# Patient Record
Sex: Female | Born: 1965 | Marital: Married | Smoking: Never smoker
Health system: Southern US, Community
[De-identification: ages and names within clinical notes are randomized; demographics above are authoritative.]

## PROBLEM LIST (undated history)

## (undated) DIAGNOSIS — G43909 Migraine, unspecified, not intractable, without status migrainosus: Secondary | ICD-10-CM

## (undated) DIAGNOSIS — Z800 Family history of malignant neoplasm of digestive organs: Secondary | ICD-10-CM

## (undated) DIAGNOSIS — Z8042 Family history of malignant neoplasm of prostate: Secondary | ICD-10-CM

## (undated) DIAGNOSIS — M19279 Secondary osteoarthritis, unspecified ankle and foot: Secondary | ICD-10-CM

## (undated) DIAGNOSIS — M1990 Unspecified osteoarthritis, unspecified site: Secondary | ICD-10-CM

## (undated) DIAGNOSIS — C50919 Malignant neoplasm of unspecified site of unspecified female breast: Secondary | ICD-10-CM

## (undated) HISTORY — DX: Malignant neoplasm of unspecified site of unspecified female breast: C50.919

## (undated) HISTORY — PX: OTHER SURGICAL HISTORY: SHX169

## (undated) HISTORY — PX: MASTECTOMY: SHX3

## (undated) HISTORY — PX: DILATION AND CURETTAGE OF UTERUS: SHX78

---

## 2020-04-28 ENCOUNTER — Other Ambulatory Visit: Payer: Self-pay | Admitting: Orthopedic Surgery

## 2020-05-01 ENCOUNTER — Encounter (HOSPITAL_BASED_OUTPATIENT_CLINIC_OR_DEPARTMENT_OTHER): Payer: Self-pay | Admitting: Orthopedic Surgery

## 2020-05-01 ENCOUNTER — Other Ambulatory Visit: Payer: Self-pay

## 2020-05-05 ENCOUNTER — Other Ambulatory Visit (HOSPITAL_COMMUNITY)
Admission: RE | Admit: 2020-05-05 | Discharge: 2020-05-05 | Disposition: A | Discharge status: Home / Self Care | Payer: Managed Care, Other (non HMO) | Source: Ambulatory Visit | Attending: Orthopedic Surgery | Admitting: Orthopedic Surgery

## 2020-05-05 DIAGNOSIS — Z20822 Contact with and (suspected) exposure to covid-19: Secondary | ICD-10-CM | POA: Diagnosis not present

## 2020-05-05 DIAGNOSIS — Z01812 Encounter for preprocedural laboratory examination: Secondary | ICD-10-CM | POA: Diagnosis present

## 2020-05-06 LAB — SARS CORONAVIRUS 2 (TAT 6-24 HRS): SARS Coronavirus 2: NEGATIVE

## 2020-05-08 ENCOUNTER — Encounter (HOSPITAL_BASED_OUTPATIENT_CLINIC_OR_DEPARTMENT_OTHER): Payer: Self-pay | Admitting: Orthopedic Surgery

## 2020-05-08 ENCOUNTER — Ambulatory Visit (HOSPITAL_BASED_OUTPATIENT_CLINIC_OR_DEPARTMENT_OTHER): Payer: Managed Care, Other (non HMO) | Admitting: Anesthesiology

## 2020-05-08 ENCOUNTER — Ambulatory Visit (HOSPITAL_BASED_OUTPATIENT_CLINIC_OR_DEPARTMENT_OTHER)
Admission: RE | Admit: 2020-05-08 | Discharge: 2020-05-08 | Disposition: A | Discharge status: Home / Self Care | Payer: Managed Care, Other (non HMO) | Attending: Orthopedic Surgery | Admitting: Orthopedic Surgery

## 2020-05-08 ENCOUNTER — Encounter (HOSPITAL_BASED_OUTPATIENT_CLINIC_OR_DEPARTMENT_OTHER)
Admission: RE | Disposition: A | Discharge status: Home / Self Care | Payer: Self-pay | Source: Home / Self Care | Attending: Orthopedic Surgery

## 2020-05-08 ENCOUNTER — Other Ambulatory Visit: Payer: Self-pay

## 2020-05-08 DIAGNOSIS — Z79899 Other long term (current) drug therapy: Secondary | ICD-10-CM | POA: Diagnosis not present

## 2020-05-08 DIAGNOSIS — M65342 Trigger finger, left ring finger: Secondary | ICD-10-CM | POA: Insufficient documentation

## 2020-05-08 DIAGNOSIS — M65332 Trigger finger, left middle finger: Secondary | ICD-10-CM | POA: Diagnosis present

## 2020-05-08 HISTORY — PX: TRIGGER FINGER RELEASE: SHX641

## 2020-05-08 HISTORY — DX: Migraine, unspecified, not intractable, without status migrainosus: G43.909

## 2020-05-08 HISTORY — DX: Secondary osteoarthritis, unspecified ankle and foot: M19.279

## 2020-05-08 HISTORY — DX: Unspecified osteoarthritis, unspecified site: M19.90

## 2020-05-08 SURGERY — RELEASE, A1 PULLEY, FOR TRIGGER FINGER
Anesthesia: Regional | Site: Hand | Laterality: Left

## 2020-05-08 MED ORDER — HYDROMORPHONE HCL 1 MG/ML IJ SOLN: 0.2500 mg | INTRAMUSCULAR | Status: DC | PRN

## 2020-05-08 MED ORDER — FENTANYL CITRATE (PF) 100 MCG/2ML IJ SOLN
INTRAMUSCULAR | Status: DC | PRN
  Administered 2020-05-08: 25 ug via INTRAVENOUS
  Administered 2020-05-08: 50 ug via INTRAVENOUS

## 2020-05-08 MED ORDER — MIDAZOLAM HCL 2 MG/2ML IJ SOLN
INTRAMUSCULAR | Status: AC
  Filled 2020-05-08: qty 2

## 2020-05-08 MED ORDER — OXYCODONE HCL 5 MG PO TABS: 5.0000 mg | ORAL_TABLET | Freq: Once | ORAL | Status: DC | PRN

## 2020-05-08 MED ORDER — PROPOFOL 500 MG/50ML IV EMUL
INTRAVENOUS | Status: DC | PRN
  Administered 2020-05-08: 100 ug/kg/min via INTRAVENOUS

## 2020-05-08 MED ORDER — ONDANSETRON HCL 4 MG/2ML IJ SOLN
INTRAMUSCULAR | Status: DC | PRN
  Administered 2020-05-08: 4 mg via INTRAVENOUS

## 2020-05-08 MED ORDER — CEFAZOLIN SODIUM-DEXTROSE 2-4 GM/100ML-% IV SOLN
INTRAVENOUS | Status: AC
  Filled 2020-05-08: qty 100

## 2020-05-08 MED ORDER — LIDOCAINE HCL (PF) 0.5 % IJ SOLN
INTRAMUSCULAR | Status: DC | PRN
  Administered 2020-05-08: 30 mL via INTRAVENOUS

## 2020-05-08 MED ORDER — HYDROCODONE-ACETAMINOPHEN 5-325 MG PO TABS: ORAL_TABLET | ORAL | 0 refills | Status: DC

## 2020-05-08 MED ORDER — MIDAZOLAM HCL 5 MG/5ML IJ SOLN
INTRAMUSCULAR | Status: DC | PRN
  Administered 2020-05-08: 2 mg via INTRAVENOUS

## 2020-05-08 MED ORDER — FENTANYL CITRATE (PF) 100 MCG/2ML IJ SOLN
INTRAMUSCULAR | Status: AC
  Filled 2020-05-08: qty 2

## 2020-05-08 MED ORDER — ONDANSETRON HCL 4 MG/2ML IJ SOLN
INTRAMUSCULAR | Status: AC
  Filled 2020-05-08: qty 2

## 2020-05-08 MED ORDER — BUPIVACAINE HCL (PF) 0.25 % IJ SOLN
INTRAMUSCULAR | Status: DC | PRN
  Administered 2020-05-08: 7 mL

## 2020-05-08 MED ORDER — CEFAZOLIN SODIUM-DEXTROSE 2-4 GM/100ML-% IV SOLN
2.0000 g | INTRAVENOUS | Status: AC
  Administered 2020-05-08: 2 g via INTRAVENOUS

## 2020-05-08 MED ORDER — PROMETHAZINE HCL 25 MG/ML IJ SOLN: 6.2500 mg | INTRAMUSCULAR | Status: DC | PRN

## 2020-05-08 MED ORDER — OXYCODONE HCL 5 MG/5ML PO SOLN: 5.0000 mg | Freq: Once | ORAL | Status: DC | PRN

## 2020-05-08 MED ORDER — LACTATED RINGERS IV SOLN: INTRAVENOUS | Status: DC

## 2020-05-08 SURGICAL SUPPLY — 33 items
APL PRP STRL LF DISP 70% ISPRP (MISCELLANEOUS) ×1
BLADE SURG 15 STRL LF DISP TIS (BLADE) ×2 IMPLANT
BLADE SURG 15 STRL SS (BLADE) ×4
BNDG CMPR 9X4 STRL LF SNTH (GAUZE/BANDAGES/DRESSINGS)
BNDG COHESIVE 2X5 TAN STRL LF (GAUZE/BANDAGES/DRESSINGS) ×2 IMPLANT
BNDG ESMARK 4X9 LF (GAUZE/BANDAGES/DRESSINGS) IMPLANT
CHLORAPREP W/TINT 26 (MISCELLANEOUS) ×2 IMPLANT
CORD BIPOLAR FORCEPS 12FT (ELECTRODE) ×2 IMPLANT
COVER BACK TABLE 60X90IN (DRAPES) ×2 IMPLANT
COVER MAYO STAND STRL (DRAPES) ×2 IMPLANT
COVER WAND RF STERILE (DRAPES) IMPLANT
CUFF TOURN SGL QUICK 18X4 (TOURNIQUET CUFF) ×2 IMPLANT
DRAPE EXTREMITY T 121X128X90 (DISPOSABLE) ×2 IMPLANT
DRAPE SURG 17X23 STRL (DRAPES) ×2 IMPLANT
GAUZE SPONGE 4X4 12PLY STRL (GAUZE/BANDAGES/DRESSINGS) ×2 IMPLANT
GAUZE XEROFORM 1X8 LF (GAUZE/BANDAGES/DRESSINGS) ×2 IMPLANT
GLOVE SRG 8 PF TXTR STRL LF DI (GLOVE) ×1 IMPLANT
GLOVE SURG ENC MOIS LTX SZ6.5 (GLOVE) ×2 IMPLANT
GLOVE SURG ENC MOIS LTX SZ7.5 (GLOVE) ×2 IMPLANT
GLOVE SURG UNDER POLY LF SZ7 (GLOVE) ×2 IMPLANT
GLOVE SURG UNDER POLY LF SZ8 (GLOVE) ×2
GOWN STRL REUS W/ TWL LRG LVL3 (GOWN DISPOSABLE) ×1 IMPLANT
GOWN STRL REUS W/TWL LRG LVL3 (GOWN DISPOSABLE) ×2
GOWN STRL REUS W/TWL XL LVL3 (GOWN DISPOSABLE) ×2 IMPLANT
NEEDLE HYPO 25X1 1.5 SAFETY (NEEDLE) ×2 IMPLANT
NS IRRIG 1000ML POUR BTL (IV SOLUTION) ×2 IMPLANT
PACK BASIN DAY SURGERY FS (CUSTOM PROCEDURE TRAY) ×2 IMPLANT
STOCKINETTE 4X48 STRL (DRAPES) ×2 IMPLANT
SUT ETHILON 4 0 PS 2 18 (SUTURE) ×2 IMPLANT
SYR BULB EAR ULCER 3OZ GRN STR (SYRINGE) ×2 IMPLANT
SYR CONTROL 10ML LL (SYRINGE) ×2 IMPLANT
TOWEL GREEN STERILE FF (TOWEL DISPOSABLE) ×4 IMPLANT
UNDERPAD 30X36 HEAVY ABSORB (UNDERPADS AND DIAPERS) ×2 IMPLANT

## 2020-05-08 NOTE — Anesthesia Postprocedure Evaluation (Signed)
Anesthesia Post Note  Patient: Julie Townsend  Procedure(s) Performed: LEFT LONG FINGER TRIGGER RELEASE AND LEFT RING FINGER TRIGGER RELEASE (Left Hand)     Patient location during evaluation: PACU Anesthesia Type: Bier Block Level of consciousness: awake and alert Pain management: pain level controlled Vital Signs Assessment: post-procedure vital signs reviewed and stable Respiratory status: spontaneous breathing, nonlabored ventilation and respiratory function stable Cardiovascular status: blood pressure returned to baseline and stable Postop Assessment: no apparent nausea or vomiting Anesthetic complications: no   No complications documented.  Last Vitals:  Vitals:   05/08/20 1152 05/08/20 1200  BP: 131/74 (!) 166/92  Pulse: 64 81  Resp: 12 12  Temp:  36.4 C  SpO2: 100% 100%    Last Pain:  Vitals:   05/08/20 1200  TempSrc:   PainSc: 0-No pain                 Lynda Rainwater

## 2020-05-08 NOTE — Op Note (Signed)
05/08/2020 [facility]  Operative Note  PREOPERATIVE DIAGNOSIS: LEFT LONG FINGER AND LEFT RING FINGER TRIGGER DIGITS  POSTOPERATIVE DIAGNOSIS:  LEFT LONG FINGER AND LEFT RING FINGER TRIGGER DIGITS  PROCEDURE: Procedure(s): LEFT LONG FINGER TRIGGER RELEASE AND LEFT RING FINGER TRIGGER RELEASE  SURGEON:  [name], MD  ASSISTANT:  none.  ANESTHESIA:  Bier block with sedation.  IV FLUIDS:  Per anesthesia flow sheet.  ESTIMATED BLOOD LOSS:  Minimal.  COMPLICATIONS:  None.  SPECIMENS:  tenosynovium to pathology  TOURNIQUET TIME:  Total Tourniquet Time Documented: Forearm (Left) - 27 minutes Total: Forearm (Left) - 27 minutes   DISPOSITION:  Stable to PACU.  LOCATION: [facility]  INDICATIONS: Julie Townsend is a 55 y.o. female with triggering left long and ring fingers.  She has had these injected without lasting resolution.  She wishes to have trigger release of the left long and ring fingers.  Risks, benefits and alternatives of surgery were discussed including the risk of blood loss, infection, damage to nerves, vessels, tendons, ligaments, bone, failure of surgery, need for additional surgery, complications with wound healing, continued pain, continued triggering and need for repeat surgery.  She voiced understanding of these risks and elected to proceed.  OPERATIVE COURSE:  After being identified preoperatively by myself, the patient and I agreed upon the procedure and site of procedure.  The surgical site was marked. Surgical consent had been signed. She was given preoperative antibiotic prophylaxis. She was transported to the operating room and placed on the operating room table in supine position with the Left upper extremity on an arm board. Bier block anesthesia was induced by the anesthesiologist.  The Left upper extremity was prepped and draped in normal sterile orthopedic fashion. A surgical pause was performed between surgeons,  anesthesia, and operating room staff, and all were in agreement as to the patient, procedure, and site of procedure.  Tourniquet at the proximal aspect of the forearm had been inflated for the Bier block.  An incision was made at the volar aspect of the MP joint of the long finger.  This was carried into the subcutaneous tissues by preading technique.  Bipolar electrocautery was used to obtain hemostasis.  The radial and ulnar digital nerves were protected throughout the case. The flexor sheath was identified.  The A1 pulley was identified and sharply incised.  It was released in its entirety.  It was thickened.  The proximal 1-2 mm of the A2 pulley was vented to allow better excursion of the tendons.  The finger was placed through a range of motion and there was noted to be no catching.  The tendons were brought through the wound.  There was thickened synovium and adhesion between the tendons.  The adhesions between the tendons were released and synovium excised and sent to pathology for examination.  An incision was made at the volar aspect of the MP joint of the ring finger.  This was carried into the subcutaneous tissues by preading technique.  Bipolar electrocautery was used to obtain hemostasis.  The radial and ulnar digital nerves were protected throughout the case. The flexor sheath was identified.  The A1 pulley was identified and sharply incised.  It was released in its entirety.  The proximal 1-2 mm of the A2 pulley was vented to allow better excursion of the tendons.  The finger was placed through a range of motion and there was noted to be no catching.  The tendons were brought through the  wound. The tendons were brought through the wound.  There was thickened synovium and adhesion between the tendons.  The adhesions between the tendons were released and synovium excised and sent to pathology for examination. The wound was then copiously irrigated with sterile saline. It was closed with 4-0 nylon in a  horizontal mattress fashion.  It was injected with 0.25% plain Marcaine to aid in postoperative analgesia.  It was dressed with sterile Xeroform, 4x4s, and wrapped lightly with a Coban dressing.  Tourniquet was deflated at 27 minutes.  The fingertips were pink with brisk capillary refill after deflation of the tourniquet.  The operative drapes were broken down and the patient was awoken from anesthesia safely.  She was transferred back to the stretcher and taken to the PACU in stable condition.   I will see her back in the office in 1 week for postoperative followup.  I will give her a prescription for Norco 5/325 1-2 tabs PO q6 hours prn pain, dispense # 20.    [name], MD Electronically signed, 05/08/20

## 2020-05-08 NOTE — H&P (Signed)
Julie Townsend is an 55 y.o. female.   Chief Complaint: trigger digits HPI: 55 year old female with triggering of left long and ring fingers.  These have been injected previously without lasting resolution.  She wishes to have surgical release of the left long and ring finger trigger digits.  Allergies: Not on File  Past Medical History:  Diagnosis Date  . Migraines   . 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 trigger finger release rt hand      Family History: History reviewed. No pertinent family history.  Social History:   reports that she has never smoked. She has never used smokeless tobacco. She reports that she does not use drugs. No history on file for alcohol use.  Medications: Medications Prior to Admission  Medication Sig Dispense Refill  . Ascorbic Acid (VITAMIN C) 100 MG tablet Take 100 mg by mouth daily.    . cholecalciferol (VITAMIN D3) 25 MCG (1000 UNIT) tablet Take 1,000 Units by mouth daily.    . magnesium 30 MG tablet Take 30 mg by mouth 2 (two) times daily.    . SUMAtriptan (IMITREX) 50 MG tablet Take 50 mg by mouth once. May repeat in 2 hours if headache persists or recurs.    Marland Kitchen l-methylfolate-B6-B12 (METANX) 3-35-2 MG TABS tablet Take 1 tablet by mouth daily.      No results found for this or any previous visit (from the past 48 hour(s)).  No results found.   A comprehensive review of systems was negative.  Blood pressure 132/82, pulse 72, temperature (!) 97.5 F (36.4 C), temperature source Oral, resp. rate 18, height 5\' 2"  (1.575 m), weight 51.9 kg, SpO2 100 %.  General appearance: alert, cooperative and appears stated age Head: Normocephalic, without obvious abnormality, atraumatic Neck: supple, symmetrical, trachea midline Cardio: regular rate and rhythm Resp: clear to auscultation bilaterally Extremities: Intact sensation and capillary refill all digits.  +epl/fpl/io.   No wounds.  Pulses: 2+ and symmetric Skin: Skin color, texture, turgor normal. No rashes or lesions Neurologic: Grossly normal Incision/Wound: none  Assessment/Plan Left long and ring finger trigger digits.  Non operative and operative treatment options have been discussed with the patient and patient wishes to proceed with operative treatment. Risks, benefits, and alternatives of surgery have been discussed and the patient agrees with the plan of care.   Julie Townsend 05/08/2020, 10:18 AM

## 2020-05-08 NOTE — Anesthesia Preprocedure Evaluation (Signed)
Anesthesia Evaluation  Patient identified by MRN, date of birth, ID band Patient awake    Reviewed: Allergy & Precautions, NPO status , Patient's Chart, lab work & pertinent test results  Airway Mallampati: II  TM Distance: >3 FB Neck ROM: Full    Dental no notable dental hx.    Pulmonary neg pulmonary ROS,    Pulmonary exam normal breath sounds clear to auscultation       Cardiovascular negative cardio ROS Normal cardiovascular exam Rhythm:Regular Rate:Normal     Neuro/Psych  Headaches, negative psych ROS   GI/Hepatic negative GI ROS, Neg liver ROS,   Endo/Other  negative endocrine ROS  Renal/GU negative Renal ROS  negative genitourinary   Musculoskeletal  (+) Arthritis ,   Abdominal   Peds negative pediatric ROS (+)  Hematology negative hematology ROS (+)   Anesthesia Other Findings   Reproductive/Obstetrics negative OB ROS                             Anesthesia Physical Anesthesia Plan  ASA: II  Anesthesia Plan: Bier Block and Bier Block-LIDOCAINE ONLY   Post-op Pain Management:    Induction: Intravenous  PONV Risk Score and Plan: 2 and Ondansetron, Midazolam and Treatment may vary due to age or medical condition  Airway Management Planned: Simple Face Mask  Additional Equipment:   Intra-op Plan:   Post-operative Plan:   Informed Consent: I have reviewed the patients History and Physical, chart, labs and discussed the procedure including the risks, benefits and alternatives for the proposed anesthesia with the patient or authorized representative who has indicated his/her understanding and acceptance.     Dental advisory given  Plan Discussed with: CRNA  Anesthesia Plan Comments:         Anesthesia Quick Evaluation

## 2020-05-08 NOTE — Discharge Instructions (Addendum)

## 2020-05-08 NOTE — Transfer of Care (Signed)
Immediate Anesthesia Transfer of Care Note  Patient: Julie Townsend  Procedure(s) Performed: LEFT LONG FINGER TRIGGER RELEASE AND LEFT RING FINGER TRIGGER RELEASE (Left Hand)  Patient Location: PACU  Anesthesia Type:Bier block  Level of Consciousness: drowsy  Airway & Oxygen Therapy: Patient Spontanous Breathing and Patient connected to face mask oxygen  Post-op Assessment: Report given to RN and Post -op Vital signs reviewed and stable  Post vital signs: Reviewed and stable  Last Vitals:  Vitals Value Taken Time  BP 92/58 05/08/20 1118  Temp 36.2 C 05/08/20 1119  Pulse 67 05/08/20 1119  Resp 12 05/08/20 1119  SpO2    Vitals shown include unvalidated device data.  Last Pain:  Vitals:   05/08/20 0819  TempSrc: Oral  PainSc: 0-No pain      Patients Stated Pain Goal: 5 (45/62/56 [id number])  Complications: No complications documented.

## 2020-05-09 ENCOUNTER — Encounter (HOSPITAL_BASED_OUTPATIENT_CLINIC_OR_DEPARTMENT_OTHER): Payer: Self-pay | Admitting: Orthopedic Surgery

## 2020-05-09 LAB — SURGICAL PATHOLOGY

## 2020-05-19 ENCOUNTER — Other Ambulatory Visit: Payer: Self-pay | Admitting: Orthopedic Surgery

## 2020-06-16 ENCOUNTER — Other Ambulatory Visit: Payer: Self-pay

## 2020-06-16 ENCOUNTER — Encounter (HOSPITAL_BASED_OUTPATIENT_CLINIC_OR_DEPARTMENT_OTHER): Payer: Self-pay | Admitting: Orthopedic Surgery

## 2020-06-17 ENCOUNTER — Other Ambulatory Visit (HOSPITAL_COMMUNITY): Payer: Managed Care, Other (non HMO)

## 2020-06-20 ENCOUNTER — Encounter (HOSPITAL_BASED_OUTPATIENT_CLINIC_OR_DEPARTMENT_OTHER): Payer: Self-pay | Admitting: Orthopedic Surgery

## 2020-06-20 ENCOUNTER — Ambulatory Visit (HOSPITAL_BASED_OUTPATIENT_CLINIC_OR_DEPARTMENT_OTHER): Payer: Managed Care, Other (non HMO) | Admitting: Certified Registered"

## 2020-06-20 ENCOUNTER — Other Ambulatory Visit: Payer: Self-pay

## 2020-06-20 ENCOUNTER — Ambulatory Visit (HOSPITAL_BASED_OUTPATIENT_CLINIC_OR_DEPARTMENT_OTHER)
Admission: RE | Admit: 2020-06-20 | Discharge: 2020-06-20 | Disposition: A | Discharge status: Home / Self Care | Payer: Managed Care, Other (non HMO) | Attending: Orthopedic Surgery | Admitting: Orthopedic Surgery

## 2020-06-20 ENCOUNTER — Encounter (HOSPITAL_BASED_OUTPATIENT_CLINIC_OR_DEPARTMENT_OTHER)
Admission: RE | Disposition: A | Discharge status: Home / Self Care | Payer: Self-pay | Source: Home / Self Care | Attending: Orthopedic Surgery

## 2020-06-20 DIAGNOSIS — G5601 Carpal tunnel syndrome, right upper limb: Secondary | ICD-10-CM | POA: Insufficient documentation

## 2020-06-20 DIAGNOSIS — G43909 Migraine, unspecified, not intractable, without status migrainosus: Secondary | ICD-10-CM | POA: Diagnosis not present

## 2020-06-20 HISTORY — PX: CARPAL TUNNEL RELEASE: SHX101

## 2020-06-20 SURGERY — CARPAL TUNNEL RELEASE
Anesthesia: General | Site: Wrist | Laterality: Right

## 2020-06-20 MED ORDER — DEXAMETHASONE SODIUM PHOSPHATE 10 MG/ML IJ SOLN
INTRAMUSCULAR | Status: AC
  Filled 2020-06-20: qty 1

## 2020-06-20 MED ORDER — CEFAZOLIN SODIUM-DEXTROSE 2-4 GM/100ML-% IV SOLN
INTRAVENOUS | Status: AC
  Filled 2020-06-20: qty 100

## 2020-06-20 MED ORDER — MIDAZOLAM HCL 5 MG/5ML IJ SOLN
INTRAMUSCULAR | Status: DC | PRN
  Administered 2020-06-20: 2 mg via INTRAVENOUS

## 2020-06-20 MED ORDER — PROPOFOL 10 MG/ML IV BOLUS
INTRAVENOUS | Status: DC | PRN
  Administered 2020-06-20: 150 mg via INTRAVENOUS

## 2020-06-20 MED ORDER — SODIUM CHLORIDE 0.9 % IR SOLN
Status: DC | PRN
  Administered 2020-06-20: 1000 mL

## 2020-06-20 MED ORDER — ONDANSETRON HCL 4 MG/2ML IJ SOLN
INTRAMUSCULAR | Status: DC | PRN
  Administered 2020-06-20: 4 mg via INTRAVENOUS

## 2020-06-20 MED ORDER — HYDROCODONE-ACETAMINOPHEN 5-325 MG PO TABS: ORAL_TABLET | ORAL | 0 refills | Status: DC

## 2020-06-20 MED ORDER — LIDOCAINE 2% (20 MG/ML) 5 ML SYRINGE
INTRAMUSCULAR | Status: AC
  Filled 2020-06-20: qty 5

## 2020-06-20 MED ORDER — CEFAZOLIN SODIUM-DEXTROSE 2-4 GM/100ML-% IV SOLN
2.0000 g | INTRAVENOUS | Status: AC
  Administered 2020-06-20: 2 g via INTRAVENOUS

## 2020-06-20 MED ORDER — PROMETHAZINE HCL 25 MG/ML IJ SOLN
6.2500 mg | Freq: Once | INTRAMUSCULAR | Status: AC
  Administered 2020-06-20: 6.25 mg via INTRAVENOUS

## 2020-06-20 MED ORDER — ONDANSETRON HCL 4 MG/2ML IJ SOLN
INTRAMUSCULAR | Status: AC
  Filled 2020-06-20: qty 2

## 2020-06-20 MED ORDER — PROMETHAZINE HCL 25 MG/ML IJ SOLN
INTRAMUSCULAR | Status: AC
  Filled 2020-06-20: qty 1

## 2020-06-20 MED ORDER — MIDAZOLAM HCL 2 MG/2ML IJ SOLN
INTRAMUSCULAR | Status: AC
  Filled 2020-06-20: qty 2

## 2020-06-20 MED ORDER — LACTATED RINGERS IV SOLN: INTRAVENOUS | Status: DC

## 2020-06-20 MED ORDER — LIDOCAINE HCL (CARDIAC) PF 100 MG/5ML IV SOSY
PREFILLED_SYRINGE | INTRAVENOUS | Status: DC | PRN
  Administered 2020-06-20: 75 mg via INTRAVENOUS

## 2020-06-20 MED ORDER — FENTANYL CITRATE (PF) 100 MCG/2ML IJ SOLN
INTRAMUSCULAR | Status: DC | PRN
  Administered 2020-06-20: 100 ug via INTRAVENOUS

## 2020-06-20 MED ORDER — PHENYLEPHRINE 40 MCG/ML (10ML) SYRINGE FOR IV PUSH (FOR BLOOD PRESSURE SUPPORT)
PREFILLED_SYRINGE | INTRAVENOUS | Status: AC
  Filled 2020-06-20: qty 10

## 2020-06-20 MED ORDER — FENTANYL CITRATE (PF) 100 MCG/2ML IJ SOLN: 25.0000 ug | INTRAMUSCULAR | Status: DC | PRN

## 2020-06-20 MED ORDER — FENTANYL CITRATE (PF) 100 MCG/2ML IJ SOLN
INTRAMUSCULAR | Status: AC
  Filled 2020-06-20: qty 2

## 2020-06-20 MED ORDER — BUPIVACAINE HCL (PF) 0.25 % IJ SOLN
INTRAMUSCULAR | Status: DC | PRN
  Administered 2020-06-20: 9 mL

## 2020-06-20 MED ORDER — DEXAMETHASONE SODIUM PHOSPHATE 4 MG/ML IJ SOLN
INTRAMUSCULAR | Status: DC | PRN
  Administered 2020-06-20: 5 mg via INTRAVENOUS

## 2020-06-20 SURGICAL SUPPLY — 32 items
APL PRP STRL LF DISP 70% ISPRP (MISCELLANEOUS) ×1
BLADE SURG 15 STRL LF DISP TIS (BLADE) ×2 IMPLANT
BLADE SURG 15 STRL SS (BLADE) ×4
BNDG CMPR 9X4 STRL LF SNTH (GAUZE/BANDAGES/DRESSINGS) ×1
BNDG ELASTIC 3X5.8 VLCR STR LF (GAUZE/BANDAGES/DRESSINGS) ×2 IMPLANT
BNDG ESMARK 4X9 LF (GAUZE/BANDAGES/DRESSINGS) ×2 IMPLANT
BNDG GAUZE ELAST 4 BULKY (GAUZE/BANDAGES/DRESSINGS) ×2 IMPLANT
CHLORAPREP W/TINT 26 (MISCELLANEOUS) ×2 IMPLANT
CORD BIPOLAR FORCEPS 12FT (ELECTRODE) ×2 IMPLANT
COVER BACK TABLE 60X90IN (DRAPES) ×2 IMPLANT
COVER MAYO STAND STRL (DRAPES) ×2 IMPLANT
CUFF TOURN SGL QUICK 18X4 (TOURNIQUET CUFF) ×2 IMPLANT
DRAPE EXTREMITY T 121X128X90 (DISPOSABLE) ×2 IMPLANT
DRAPE SURG 17X23 STRL (DRAPES) ×2 IMPLANT
DRSG PAD ABDOMINAL 8X10 ST (GAUZE/BANDAGES/DRESSINGS) ×2 IMPLANT
GAUZE SPONGE 4X4 12PLY STRL (GAUZE/BANDAGES/DRESSINGS) ×2 IMPLANT
GAUZE XEROFORM 1X8 LF (GAUZE/BANDAGES/DRESSINGS) ×2 IMPLANT
GLOVE SRG 8 PF TXTR STRL LF DI (GLOVE) ×1 IMPLANT
GLOVE SURG ENC MOIS LTX SZ7.5 (GLOVE) ×2 IMPLANT
GLOVE SURG UNDER POLY LF SZ8 (GLOVE) ×2
GOWN STRL REUS W/ TWL LRG LVL3 (GOWN DISPOSABLE) ×1 IMPLANT
GOWN STRL REUS W/TWL LRG LVL3 (GOWN DISPOSABLE) ×2
GOWN STRL REUS W/TWL XL LVL3 (GOWN DISPOSABLE) ×2 IMPLANT
NEEDLE HYPO 25X1 1.5 SAFETY (NEEDLE) ×2 IMPLANT
NS IRRIG 1000ML POUR BTL (IV SOLUTION) ×2 IMPLANT
PACK BASIN DAY SURGERY FS (CUSTOM PROCEDURE TRAY) ×2 IMPLANT
STOCKINETTE 4X48 STRL (DRAPES) ×2 IMPLANT
SUT ETHILON 4 0 PS 2 18 (SUTURE) ×2 IMPLANT
SYR BULB EAR ULCER 3OZ GRN STR (SYRINGE) ×2 IMPLANT
SYR CONTROL 10ML LL (SYRINGE) ×2 IMPLANT
TOWEL GREEN STERILE FF (TOWEL DISPOSABLE) ×4 IMPLANT
UNDERPAD 30X36 HEAVY ABSORB (UNDERPADS AND DIAPERS) ×2 IMPLANT

## 2020-06-20 NOTE — Op Note (Signed)
06/20/2020 [facility]                              OPERATIVE REPORT   PREOPERATIVE DIAGNOSIS:  Right carpal tunnel syndrome.  POSTOPERATIVE DIAGNOSIS:  Right carpal tunnel syndrome.  PROCEDURE:  Right carpal tunnel release.  SURGEON:  [name], MD  ASSISTANT:  none.  ANESTHESIA: General  IV FLUIDS:  Per anesthesia flow sheet.  ESTIMATED BLOOD LOSS:  Minimal.  COMPLICATIONS:  None.  SPECIMENS:  None.  TOURNIQUET TIME:    Total Tourniquet Time Documented: Upper Arm (Right) - 12 minutes Total: Upper Arm (Right) - 12 minutes   DISPOSITION:  Stable to PACU.  LOCATION: [facility]  INDICATIONS:  55 yo female with numbness and tingling right hand.  Positive nerve conduction studies.  She wishes to have a carpal tunnel release for management of her symptoms.  Risks, benefits and alternatives of surgery were discussed including the risk of blood loss; infection; damage to nerves, vessels, tendons, ligaments, bone; failure of surgery; need for additional surgery; complications with wound healing; continued pain; recurrence of carpal tunnel syndrome; and damage to motor branch. She voiced understanding of these risks and elected to proceed.   OPERATIVE COURSE:  After being identified preoperatively by myself, the patient and I agreed upon the procedure and site of procedure.  The surgical site was marked.  Surgical consent had been signed.  She was given IV Ancef as preoperative antibiotic prophylaxis.  She was transferred to the operating room and placed on the operating room table in supine position with the Right upper extremity on an armboard.  General anesthesia was induced by the anesthesiologist.  Right upper extremity was prepped and draped in normal sterile orthopaedic fashion.  A surgical pause was performed between the surgeons, anesthesia, and operating room staff, and all were in agreement as to the patient, procedure, and site of procedure.   Tourniquet at the proximal aspect of the extremity was inflated to 250 mmHg after exsanguination of the arm with an Esmarch bandage  Incision was made over the transverse carpal ligament and carried into the subcutaneous tissues by spreading technique.  Bipolar electrocautery was used to obtain hemostasis.  The palmar fascia was sharply incised.  The transverse carpal ligament was identified and sharply incised.  It was incised distally first.  The flexor tendons were identified.  The flexor tendon to the ring finger was identified and retracted radially.  The transverse carpal ligament was then incised proximally.  Scissors were used to split the distal aspect of the volar antebrachial fascia.  A finger was placed into the wound to ensure complete decompression, which was the case.  The nerve was examined.  It was flattened and hyperemic.  The motor branch was identified and was intact.  The wound was copiously irrigated with sterile saline.  It was then closed with 4-0 nylon in a horizontal mattress fashion.  It was injected with 0.25% plain Marcaine to aid in postoperative analgesia.  It was dressed with sterile Xeroform, 4x4s, an ABD, and wrapped with Kerlix and an Ace bandage.  Tourniquet was deflated at 12 minutes.  Fingertips were pink with brisk capillary refill after deflation of the tourniquet.  Operative drapes were broken down.  The patient was awoken from anesthesia safely.  She was transferred back to stretcher and taken to the PACU in stable condition.  I will see her back in the office  in 1 week for postoperative followup.  I will give her a prescription for Norco 5/325 1-2 tabs PO q6 hours prn pain, dispense # 20.    [name], MD Electronically signed, 06/20/20

## 2020-06-20 NOTE — Anesthesia Preprocedure Evaluation (Signed)
Anesthesia Evaluation  Patient identified by MRN, date of birth, ID band Patient awake    Reviewed: Allergy & Precautions, NPO status , Patient's Chart, lab work & pertinent test results  Airway Mallampati: II  TM Distance: >3 FB     Dental   Pulmonary    breath sounds clear to auscultation       Cardiovascular negative cardio ROS   Rhythm:Regular Rate:Normal     Neuro/Psych  Headaches,    GI/Hepatic negative GI ROS, Neg liver ROS,   Endo/Other    Renal/GU negative Renal ROS     Musculoskeletal   Abdominal   Peds  Hematology negative hematology ROS (+)   Anesthesia Other Findings   Reproductive/Obstetrics                             Anesthesia Physical Anesthesia Plan  ASA: II  Anesthesia Plan: General   Post-op Pain Management:    Induction: Intravenous  PONV Risk Score and Plan: 3 and Ondansetron, Dexamethasone and Midazolam  Airway Management Planned: LMA  Additional Equipment:   Intra-op Plan:   Post-operative Plan: Extubation in OR  Informed Consent: I have reviewed the patients History and Physical, chart, labs and discussed the procedure including the risks, benefits and alternatives for the proposed anesthesia with the patient or authorized representative who has indicated his/her understanding and acceptance.     Dental advisory given  Plan Discussed with: CRNA and Anesthesiologist  Anesthesia Plan Comments:         Anesthesia Quick Evaluation

## 2020-06-20 NOTE — H&P (Signed)
Julie Townsend is an 55 y.o. female.   Chief Complaint: carpal tunnel syndrome HPI: 55 yo female with numbness and tingling right hand.  Positive nerve conduction studies.  She wishes to have right carpal tunnel release.  Allergies: No Active Allergies  Past Medical History:  Diagnosis Date  . Migraines   . 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 TRIGGER FINGER RELEASE Left 05/08/2020   Procedure: LEFT LONG FINGER TRIGGER RELEASE AND LEFT RING FINGER TRIGGER RELEASE;  Surgeon: [name], MD;  Location: [place];  Service: Orthopedics;  Laterality: Left;  . trigger finger release rt hand      Family History: History reviewed. No pertinent family history.  Social History:   reports that she has never smoked. She has never used smokeless tobacco. She reports that she does not use drugs. No history on file for alcohol use.  Medications: Medications Prior to Admission  Medication Sig Dispense Refill  . Ascorbic Acid (VITAMIN C) 100 MG tablet Take 100 mg by mouth daily.    . cholecalciferol (VITAMIN D3) 25 MCG (1000 UNIT) tablet Take 1,000 Units by mouth daily.    Marland Kitchen l-methylfolate-B6-B12 (METANX) 3-35-2 MG TABS tablet Take 1 tablet by mouth daily.    . magnesium 30 MG tablet Take 30 mg by mouth 2 (two) times daily.    . SUMAtriptan (IMITREX) 50 MG tablet Take 50 mg by mouth once. May repeat in 2 hours if headache persists or recurs.    Marland Kitchen HYDROcodone-acetaminophen (NORCO) 5-325 MG tablet 1-2 tabs po q6 hours prn pain 20 tablet 0    No results found for this or any previous visit (from the past 48 hour(s)).  No results found.   A comprehensive review of systems was negative.  Height 5\' 2"  (1.575 m), weight 49.9 kg.  General appearance: alert, cooperative and appears stated age Head: Normocephalic, without obvious abnormality, atraumatic Neck: supple, symmetrical, trachea  midline Cardio: regular rate and rhythm Resp: clear to auscultation bilaterally Extremities: Intact sensation and capillary refill all digits.  +epl/fpl/io.  No wounds.  Pulses: 2+ and symmetric Skin: Skin color, texture, turgor normal. No rashes or lesions Neurologic: Grossly normal Incision/Wound: none  Assessment/Plan Right carpal tunnel syndrome.  Non operative and operative treatment options have been discussed with the patient and patient wishes to proceed with operative treatment. Risks, benefits, and alternatives of surgery have been discussed and the patient agrees with the plan of care.   [name] 06/20/2020, 12:05 PM

## 2020-06-20 NOTE — Anesthesia Procedure Notes (Signed)
Procedure Name: LMA Insertion Performed by: Suellen Durocher, [place], CRNA Pre-anesthesia Checklist: Patient identified, Emergency Drugs available, Suction available and Patient being monitored Patient Re-evaluated:Patient Re-evaluated prior to induction Oxygen Delivery Method: Circle system utilized Preoxygenation: Pre-oxygenation with 100% oxygen Induction Type: IV induction Ventilation: Mask ventilation without difficulty LMA: LMA inserted LMA Size: 3.0 Number of attempts: 1 Airway Equipment and Method: Bite block Placement Confirmation: positive ETCO2 Tube secured with: Tape Dental Injury: Teeth and Oropharynx as per pre-operative assessment        

## 2020-06-20 NOTE — Transfer of Care (Signed)
Immediate Anesthesia Transfer of Care Note  Patient: Julie Townsend  Procedure(s) Performed: CARPAL TUNNEL RELEASE (Right Wrist)  Patient Location: PACU  Anesthesia Type:General  Level of Consciousness: awake  Airway & Oxygen Therapy: Patient Spontanous Breathing and Patient connected to face mask oxygen  Post-op Assessment: Report given to RN and Post -op Vital signs reviewed and stable  Post vital signs: Reviewed and stable  Last Vitals:  Vitals Value Taken Time  BP    Temp    Pulse    Resp    SpO2      Last Pain: There were no vitals filed for this visit.       Complications: No complications documented.

## 2020-06-20 NOTE — Discharge Instructions (Addendum)

## 2020-06-20 NOTE — Anesthesia Postprocedure Evaluation (Signed)
Anesthesia Post Note  Patient: Julie Townsend  Procedure(s) Performed: CARPAL TUNNEL RELEASE (Right Wrist)     Patient location during evaluation: PACU Anesthesia Type: General Level of consciousness: awake Pain management: pain level controlled Vital Signs Assessment: post-procedure vital signs reviewed and stable Respiratory status: spontaneous breathing Cardiovascular status: stable Postop Assessment: no apparent nausea or vomiting Anesthetic complications: no   No complications documented.  Last Vitals:  Vitals:   06/20/20 1409  BP: 116/63  Pulse: 69  Resp: 18  Temp: (!) 36.4 C  SpO2: 99%    Last Pain: There were no vitals filed for this visit.               Deaven Urwin

## 2020-06-23 ENCOUNTER — Encounter (HOSPITAL_BASED_OUTPATIENT_CLINIC_OR_DEPARTMENT_OTHER): Payer: Self-pay | Admitting: Orthopedic Surgery

## 2020-07-29 ENCOUNTER — Other Ambulatory Visit: Payer: Self-pay | Admitting: Nurse Practitioner

## 2020-07-29 DIAGNOSIS — Z1231 Encounter for screening mammogram for malignant neoplasm of breast: Secondary | ICD-10-CM

## 2020-08-20 ENCOUNTER — Encounter: Payer: Self-pay | Admitting: Neurology

## 2020-09-19 ENCOUNTER — Ambulatory Visit
Admission: RE | Admit: 2020-09-19 | Discharge: 2020-09-19 | Disposition: A | Discharge status: Home / Self Care | Payer: Managed Care, Other (non HMO) | Source: Ambulatory Visit | Attending: Nurse Practitioner | Admitting: Nurse Practitioner

## 2020-09-19 ENCOUNTER — Other Ambulatory Visit: Payer: Self-pay

## 2020-09-19 DIAGNOSIS — Z1231 Encounter for screening mammogram for malignant neoplasm of breast: Secondary | ICD-10-CM

## 2020-11-05 NOTE — Progress Notes (Signed)
NEUROLOGY CONSULTATION NOTE  Julie Townsend MRN: [id number] DOB: Jan 03, 1966  Referring provider: Ma Hillock, FNP Primary care provider: No PCP  Reason for consult:  migraine  Assessment/Plan:   Chronic migraine without aura, without status migrainosus, not intractable  Migraine prevention:  Emgality every 28 days Migraine rescue:  She will try Nurtec.  May use sumatriptan as needed Limit use of pain relievers to no more than 2 days out of week to prevent risk of rebound or medication-overuse headache. Keep headache diary Follow up 6 months    Subjective:  Julie Townsend is a 55 year old female who presents for migraines.  History supplemented by referring provider's note.  She has a dull headache daily. As for migraines: Onset:  55 years old Location:  over left eye or across forehead, maxillary pressure Quality:  stabbing Intensity:  5-7/10 (rarely more severe).  She denies new headache, thunderclap headache or severe headache that wakes her from sleep. Aura:  absent Prodrome:  absent Associated symptoms:  Nausea, photophobia, phonophobia, irritable.  She denies associated vomiting, autonomic symptoms, visual disturbance, unilateral numbness or weakness. Duration:  24-48 hours (if takes sumatriptan, will take a nap for 45 min to 3 hours and wakes up resolved)  Typically wakes up with it. Frequency:  once a week from 1 to 2 days (3 days before the next Emgality shot she has a severe migraine - she had a lot more migraine days prior to [employer].   Frequency of abortive medication: 1  day a week  Triggers:  change in barometric pressure Relieving factors:  rest, ice pack, heat, sleep Activity:  movement aggravates  Current NSAIDS/analgesics:  ibuprofen Current triptans:  sumatriptan 50mg  Current ergotamine:  none Current anti-emetic:  none Current muscle relaxants:  none Current Antihypertensive medications:  none Current Antidepressant  medications:  none Current Anticonvulsant medications:  none Current anti-CGRP:  Emgality Current Vitamins/Herbal/Supplements:  none Current Antihistamines/Decongestants:  none Other therapy:  ice packs, dark room, rest Hormone/birth control:  estraiol  Past NSAIDS/analgesics:  Sprix NS, Fioricet, Excedrin Migraine Past abortive triptans:  Axert, Maxalt, Relpax, Zomig Past abortive ergotamine:  none Past muscle relaxants:  none Past anti-emetic:  none Past antihypertensive medications:  verapamil Past antidepressant medications:  amitriptyline Past anticonvulsant medications:  Topiramate, Depakote Past anti-CGRP:  Aimovig Past vitamins/Herbal/Supplements:  Mg, B2 Past antihistamines/decongestants:  none Other past therapies:  none  Caffeine:  No coffee or colas.  Alcohol:  No Smoker:  No Diet:  Herbal tea all day.  Eats a lot of fruits and vegetables.  Little protein.  No chocolate or cheese Exercise:  routine.  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     PAST SURGICAL HISTORY: Past Surgical History:  Procedure Laterality Date   CARPAL TUNNEL RELEASE Right 06/20/2020   Procedure: CARPAL TUNNEL RELEASE;  Surgeon: [name], MD;  Location: [facility];  Service: Orthopedics;  Laterality: Right;   TRIGGER FINGER RELEASE Left 05/08/2020   Procedure: LEFT LONG FINGER TRIGGER RELEASE AND LEFT RING FINGER TRIGGER RELEASE;  Surgeon: [name], MD;  Location: [facility];  Service: Orthopedics;  Laterality: Left;  trigger finger release rt hand      MEDICATIONS: Current Outpatient Medications on File Prior to  Visit  Medication Sig Dispense Refill   Ascorbic Acid (VITAMIN C) 100 MG tablet Take 100 mg by mouth daily.     cholecalciferol (VITAMIN D3) 25 MCG (1000 UNIT) tablet Take 1,000 Units by mouth daily.     Galcanezumab-gnlm (EMGALITY [place]) Inject into the skin.     HYDROcodone-acetaminophen (NORCO) 5-325 MG tablet 1-2 tabs po q6 hours prn pain 20 tablet 0   l-methylfolate-B6-B12 (METANX) 3-35-2 MG TABS tablet Take 1 tablet by mouth daily. (Patient not taking: Reported on 06/20/2020)     magnesium 30 MG tablet Take 30 mg by mouth 2 (two) times daily.     SUMAtriptan (IMITREX) 50 MG tablet Take 50 mg by mouth once. May repeat in 2 hours if headache persists or recurs.     No current facility-administered medications on file prior to visit.    ALLERGIES: No Active Allergies  FAMILY HISTORY: Family History  Problem Relation Age of Onset   Breast cancer Mother     Objective:  Blood pressure 128/77, pulse 83, height 5\' 2"  (1.575 m), weight 118 lb (53.5 kg), SpO2 99 %. General: No acute distress.  Patient appears well-groomed.   Head:  Normocephalic/atraumatic Eyes:  fundi examined but not visualized Neck: supple, no paraspinal tenderness, full range of motion Back: No paraspinal tenderness Heart: regular rate and rhythm Lungs: Clear to auscultation bilaterally. Vascular: No carotid bruits. Neurological Exam: Mental status: alert and oriented to person, place, and time, recent and remote memory intact, fund of knowledge intact, attention and concentration intact, speech fluent and not dysarthric, language intact. Cranial nerves: CN I: not tested CN II: pupils equal, round and reactive to light, visual fields intact CN III, IV, VI:  full range of motion, no nystagmus, no ptosis CN V: facial sensation intact. CN VII: upper and lower face symmetric CN VIII: hearing intact CN IX, X: gag intact, uvula midline CN XI: sternocleidomastoid and trapezius muscles intact CN XII: tongue  midline Bulk & Tone: normal, no fasciculations. Motor:  muscle strength 5/5 throughout Sensation:  Pinprick, temperature and vibratory sensation intact. Deep Tendon Reflexes:  2+ throughout,  toes downgoing.   Finger to nose testing:  Without dysmetria.   Heel to shin:  Without dysmetria.   Gait:  Normal station and stride.  Romberg negative.    Thank you for allowing me to take part in the care of this patient.  Metta Clines, DO  CC: Burman Riis, FNP

## 2020-11-06 ENCOUNTER — Other Ambulatory Visit: Payer: Self-pay

## 2020-11-06 ENCOUNTER — Ambulatory Visit (INDEPENDENT_AMBULATORY_CARE_PROVIDER_SITE_OTHER): Payer: Managed Care, Other (non HMO) | Admitting: Neurology

## 2020-11-06 ENCOUNTER — Encounter: Payer: Self-pay | Admitting: Neurology

## 2020-11-06 VITALS — BP 128/77 | HR 83 | Ht 62.0 in | Wt 118.0 lb

## 2020-11-06 DIAGNOSIS — G43709 Chronic migraine without aura, not intractable, without status migrainosus: Secondary | ICD-10-CM | POA: Diagnosis not present

## 2020-11-06 MED ORDER — EMGALITY 120 MG/ML ~~LOC~~ SOAJ: 120.0000 mg | SUBCUTANEOUS | 1 refills | Status: DC

## 2020-11-06 NOTE — Patient Instructions (Signed)
  Emgality every 28 days Take Nurtec at earliest onset of headache.  Maximum 1 in 24 hours.  Contact me if effective or not.  May still use sumatriptan but limit to no more than 2 days out of week. Limit use of pain relievers to no more than 2 days out of the week.  These medications include acetaminophen, NSAIDs (ibuprofen/Advil/Motrin, naproxen/Aleve, triptans (Imitrex/sumatriptan), Excedrin, and narcotics.  This will help reduce risk of rebound headaches. Be aware of common food triggers:  - Caffeine:  coffee, black tea, cola, Mt. Dew  - Chocolate  - Dairy:  aged cheeses (brie, blue, cheddar, gouda, [place], provolone, [place], Swiss, etc), chocolate milk, buttermilk, sour cream, limit eggs and yogurt  - Nuts, peanut butter  - Alcohol  - Cereals/grains:  FRESH breads (fresh bagels, sourdough, doughnuts), yeast productions  - Processed/canned/aged/cured meats (pre-packaged deli meats, hotdogs)  - MSG/glutamate:  soy sauce, flavor enhancer, pickled/preserved/marinated foods  - Sweeteners:  aspartame (Equal, Nutrasweet).  Sugar and Splenda are okay  - Vegetables:  legumes (lima beans, lentils, snow peas, fava beans, pinto peans, peas, garbanzo beans), sauerkraut, onions, olives, pickles  - Fruit:  avocados, bananas, citrus fruit (orange, lemon, grapefruit), mango  - Other:  Frozen meals, macaroni and cheese Routine exercise Stay adequately hydrated (aim for 64 oz water daily) Keep headache diary Maintain proper stress management Maintain proper sleep hygiene Do not skip meals Consider supplements:  magnesium citrate 400mg  daily, riboflavin 400mg  daily, coenzyme Q10 100mg  three times daily.

## 2020-11-18 ENCOUNTER — Telehealth: Payer: Self-pay | Admitting: Neurology

## 2020-11-18 NOTE — Telephone Encounter (Signed)
Pt called in and left a message. She got samples at her last visit, but they are not working. She would like to try something else

## 2020-11-19 NOTE — Telephone Encounter (Signed)
Pt advised of dr.jaffe note, She can come by and pick up samples of Ubrelvy 100mg  - take 1 tablet earliest onset of headache, may repeat in 2 hours (maximum 2 tablets in 24 hours).  We can give her 4 boxes so she has the ability to try 2 tablets (if needed) for 2 different migraines.   Office hour given to pt.

## 2020-12-02 ENCOUNTER — Telehealth: Payer: Self-pay | Admitting: Neurology

## 2020-12-02 MED ORDER — UBRELVY 100 MG PO TABS: 100.0000 mg | ORAL_TABLET | ORAL | 5 refills | Status: DC | PRN

## 2020-12-02 NOTE — Telephone Encounter (Signed)
Urbelvy 100 mg script sent to the pharmacy per pt request.

## 2020-12-02 NOTE — Telephone Encounter (Signed)
Pt was given samples, the ubrelvy worked. She would like a RX sent to [facility] [place] [phone]

## 2021-03-30 ENCOUNTER — Other Ambulatory Visit: Payer: Self-pay | Admitting: Neurology

## 2021-05-20 NOTE — Progress Notes (Signed)
? ?NEUROLOGY FOLLOW UP OFFICE NOTE ? ?Julie Townsend ?[id number] ? ?Assessment/Plan:  ? ?Chronic migraine without aura, without status migrainosus, not intractable ?  ?Migraine prevention:  Emgality every 28 days ?Migraine rescue:  Roselyn Meier '100mg'$  first line (knows she may repeat after 2 hours); sumatriptan '50mg'$  second line ?Limit use of pain relievers to no more than 2 days out of week to prevent risk of rebound or medication-overuse headache. ?Keep headache diary ?Follow up 6 months ?  ?  ?  ?Subjective:  ?Julie Townsend is a 56 year old female who follows up for migraine. ? ?UPDATE: ?Nurtec was ineffective for rescue.  Roselyn Meier more effective. ?Intensity:  5-6/10 ?Duration:  4-6 hours (however waiting to repeat Ubrelvy dose) ?Frequency:  4 days (2 before the Emgality injection and 2 days after injection) ? ?Rescue protocol:  First line Ubrelvy, sumatriptan second line ?Frequency of abortive medication: 4 days a month ?Current NSAIDS/analgesics:  ibuprofen ?Current triptans:  sumatriptan '50mg'$  ?Current ergotamine:  none ?Current anti-emetic:  none ?Current muscle relaxants:  none ?Current Antihypertensive medications:  none ?Current Antidepressant medications:  none ?Current Anticonvulsant medications:  none ?Current anti-CGRP:  Emgality, Roselyn Meier '100mg'$  ?Current Vitamins/Herbal/Supplements:  Mg ?Current Antihistamines/Decongestants:  none ?Other therapy:  ice packs, dark room, rest ?Hormone/birth control:  estradiol ? ?Caffeine:  No coffee or colas.  ?Alcohol:  No ?Smoker:  No ?Diet:  Herbal tea all day.  Eats a lot of fruits and vegetables.  Little protein.  No chocolate or cheese ?Exercise:  routine.  Home gym ?Depression:  yes but mild; Anxiety:  no ?Other pain:  osteoarthritis in hands ?Sleep hygiene:  Poor as she is going through menopause ? ?HISTORY:  ?She has a dull headache daily. As for migraines: ?Onset:  56 years old ?Location:  over left eye or across forehead, maxillary pressure ?Quality:   stabbing ?Initial Intensity:  5-7/10 (rarely more severe).  She denies new headache, thunderclap headache or severe headache that wakes her from sleep. ?Aura:  absent ?Prodrome:  absent ?Associated symptoms:  Nausea, photophobia, phonophobia, irritable.  She denies associated vomiting, autonomic symptoms, visual disturbance, unilateral numbness or weakness. ?Initial Duration:  24-48 hours (if takes sumatriptan, will take a nap for 45 min to 3 hours and wakes up resolved)  Typically wakes up with it. ?Initial Frequency:  once a week from 1 to 2 days (3 days before the next Emgality shot she has a severe migraine - she had a lot more migraine days prior to [employer].   ?Frequency of abortive medication: 1  day a week  ?Triggers:  change in barometric pressure ?Relieving factors:  rest, ice pack, heat, sleep ?Activity:  movement aggravates ?  ? ?  ?Past NSAIDS/analgesics:  Sprix NS, Fioricet, Excedrin Migraine ?Past abortive triptans:  Axert, Maxalt, Relpax, Zomig ?Past abortive ergotamine:  none ?Past muscle relaxants:  none ?Past anti-emetic:  none ?Past antihypertensive medications:  verapamil ?Past antidepressant medications:  amitriptyline ?Past anticonvulsant medications:  Topiramate, Depakote ?Past anti-CGRP:  Aimovig, Nurtec (rescue) ?Past vitamins/Herbal/Supplements:  Mg, B2 ?Past antihistamines/decongestants:  none ?Other past therapies:  none ?  ? ?Family history of migraines:  daughter, mother, maternal grandmother, maternal great-grandmother, sister ? ?PAST MEDICAL HISTORY: ?Past Medical History:  ?Diagnosis Date  ? Migraines   ? 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 correct Hands  ? ? ?MEDICATIONS: ?Current Outpatient Medications on File Prior to Visit  ?Medication Sig Dispense Refill  ? Ascorbic Acid (VITAMIN C) 100 MG tablet Take 100  mg by mouth daily. (Patient not taking: Reported on 11/06/2020)    ? cholecalciferol (VITAMIN D3) 25 MCG (1000 UNIT) tablet Take 1,000 Units by mouth  daily. (Patient not taking: Reported on 11/06/2020)    ? EMGALITY 120 MG/ML SOAJ INJECT 120 MG INTO THE SKIN EVERY 28 (TWENTY-EIGHT) DAYS. 1 mL 1  ? HYDROcodone-acetaminophen (NORCO) 5-325 MG tablet 1-2 tabs po q6 hours prn pain (Patient not taking: Reported on 11/06/2020) 20 tablet 0  ? l-methylfolate-B6-B12 (METANX) 3-35-2 MG TABS tablet Take 1 tablet by mouth daily. (Patient not taking: No sig reported)    ? magnesium 30 MG tablet Take 30 mg by mouth 2 (two) times daily. (Patient not taking: Reported on 11/06/2020)    ? SUMAtriptan (IMITREX) 50 MG tablet Take 50 mg by mouth once. May repeat in 2 hours if headache persists or recurs.    ? UBRELVY 100 MG TABS TAKE AS NEEDED (TAKE 1 AT THE EARLIST ONSET OF A MIGRAINE. MAY REPEAT IN 2 HOURS. MAX 2/24 HOURS). 16 tablet 1  ? ?No current facility-administered medications on file prior to visit.  ? ? ?ALLERGIES: ?No Known Allergies ? ?FAMILY HISTORY: ?Family History  ?Problem Relation Age of Onset  ? Migraines Mother   ? Breast cancer Mother   ? Migraines Maternal Aunt   ? Migraines Maternal Grandmother   ? ? ?  ?Objective:  ?Blood pressure 125/79, pulse 76, height '5\' 2"'$  (1.575 m), weight 114 lb 12.8 oz (52.1 kg), SpO2 99 %. ?General: No acute distress.  Patient appears well-groomed.   ? ? ?Metta Clines, DO ? ? ? ? ? ? ? ?

## 2021-05-21 ENCOUNTER — Ambulatory Visit (INDEPENDENT_AMBULATORY_CARE_PROVIDER_SITE_OTHER): Payer: Managed Care, Other (non HMO) | Admitting: Neurology

## 2021-05-21 ENCOUNTER — Encounter: Payer: Self-pay | Admitting: Neurology

## 2021-05-21 VITALS — BP 125/79 | HR 76 | Ht 62.0 in | Wt 114.8 lb

## 2021-05-21 DIAGNOSIS — G43009 Migraine without aura, not intractable, without status migrainosus: Secondary | ICD-10-CM | POA: Diagnosis not present

## 2021-05-21 MED ORDER — UBRELVY 100 MG PO TABS: ORAL_TABLET | ORAL | 1 refills | Status: DC

## 2021-05-21 MED ORDER — EMGALITY 120 MG/ML ~~LOC~~ SOAJ: 120.0000 mg | SUBCUTANEOUS | 1 refills | Status: DC

## 2021-05-21 MED ORDER — SUMATRIPTAN SUCCINATE 50 MG PO TABS: 50.0000 mg | ORAL_TABLET | ORAL | 1 refills | Status: DC | PRN

## 2021-06-02 ENCOUNTER — Telehealth: Payer: 59 | Admitting: Neurology

## 2021-06-02 ENCOUNTER — Other Ambulatory Visit: Payer: Self-pay | Admitting: Neurology

## 2021-06-02 NOTE — Telephone Encounter (Signed)
Patient had new ins, we put into computer. She needs her emgality to go through [place]. Julie Townsend needs to contact the ins. ?

## 2021-06-04 ENCOUNTER — Telehealth (HOSPITAL_COMMUNITY): Payer: Self-pay | Admitting: Pharmacy Technician

## 2021-06-04 ENCOUNTER — Other Ambulatory Visit (HOSPITAL_COMMUNITY): Payer: Self-pay

## 2021-06-04 NOTE — Telephone Encounter (Signed)
Patient Advocate Encounter ?  ?Received notification that prior authorization for Emgality '120MG'$ /ML auto-injectors (migraine) is required. ?  ?PA submitted on 06/04/2021 ?[place] ?Status is pending ?   ? ? ?Lady Deutscher, CPhT-Adv ?Pharmacy Patient Advocate Specialist ?[facility] Patient Advocate Team ?Direct Number: [phone]  Fax: [phone] ? ?

## 2021-06-11 ENCOUNTER — Other Ambulatory Visit (HOSPITAL_COMMUNITY): Payer: Self-pay

## 2021-06-15 ENCOUNTER — Other Ambulatory Visit (HOSPITAL_COMMUNITY): Payer: Self-pay

## 2021-06-15 NOTE — Telephone Encounter (Signed)
Called and checked on status of prior authorization on 06/15/2021.  Insurance is still working on it. ? ?Lyndel Safe, CPHT ?Pharmacy Patient Advocate Specialist ?[facility] Patient Advocate Team ?Direct Number: [phone]  Fax: [phone] ? ?  ?

## 2021-06-19 ENCOUNTER — Other Ambulatory Visit (HOSPITAL_COMMUNITY): Payer: Self-pay

## 2021-06-22 ENCOUNTER — Other Ambulatory Visit: Payer: Self-pay | Admitting: Neurology

## 2021-06-22 NOTE — Telephone Encounter (Signed)
Patient Advocate Encounter  Prior Authorization for Emgality '120MG'$ /ML auto-injectors (migraine) has been approved.    PA# [id number] Effective dates: 06/04/2021 through 06/18/2022      Lyndel Safe, [place] Patient Advocate Specialist [place] Patient Advocate Team Direct Number: [phone]  Fax: [phone]

## 2021-07-30 ENCOUNTER — Other Ambulatory Visit (HOSPITAL_COMMUNITY)
Admission: RE | Admit: 2021-07-30 | Discharge: 2021-07-30 | Disposition: A | Discharge status: Home / Self Care | Payer: Commercial Managed Care - HMO | Source: Ambulatory Visit | Attending: Nurse Practitioner | Admitting: Nurse Practitioner

## 2021-07-30 ENCOUNTER — Other Ambulatory Visit: Payer: Self-pay | Admitting: Nurse Practitioner

## 2021-07-30 DIAGNOSIS — Z1231 Encounter for screening mammogram for malignant neoplasm of breast: Secondary | ICD-10-CM

## 2021-07-30 DIAGNOSIS — Z124 Encounter for screening for malignant neoplasm of cervix: Secondary | ICD-10-CM | POA: Insufficient documentation

## 2021-08-01 ENCOUNTER — Other Ambulatory Visit: Payer: Self-pay | Admitting: Neurology

## 2021-08-03 LAB — CYTOLOGY - PAP
Comment: NEGATIVE
Diagnosis: NEGATIVE
High risk HPV: NEGATIVE

## 2021-08-05 ENCOUNTER — Telehealth (HOSPITAL_COMMUNITY): Payer: Self-pay | Admitting: Pharmacy Technician

## 2021-08-05 ENCOUNTER — Other Ambulatory Visit (HOSPITAL_COMMUNITY): Payer: Self-pay

## 2021-08-05 NOTE — Telephone Encounter (Signed)
Patient Advocate Encounter   Received notification that prior authorization for Ubrelvy '100MG'$  tablets is required.   PA submitted on 08/05/2021 Key [id number] Status is pending       Lyndel Safe, [place] Patient Advocate Specialist [place] Patient Advocate Team Direct Number: [phone]  Fax: [phone]

## 2021-08-06 NOTE — Telephone Encounter (Signed)
Patient Advocate Encounter  Prior Authorization for Roselyn Meier '100MG'$  tablets has been approved.    PA# [id number] Effective dates: 08/05/2021 through 08/06/2022      Lyndel Safe, [place] Patient Advocate Specialist [place] Patient Advocate Team Direct Number: [phone]  Fax: [phone]

## 2021-08-10 ENCOUNTER — Other Ambulatory Visit (HOSPITAL_COMMUNITY): Payer: Self-pay

## 2021-08-20 ENCOUNTER — Ambulatory Visit
Admission: RE | Admit: 2021-08-20 | Discharge: 2021-08-20 | Disposition: A | Discharge status: Home / Self Care | Payer: Commercial Managed Care - HMO | Source: Ambulatory Visit | Attending: Nurse Practitioner | Admitting: Nurse Practitioner

## 2021-08-20 DIAGNOSIS — Z1231 Encounter for screening mammogram for malignant neoplasm of breast: Secondary | ICD-10-CM

## 2021-08-24 ENCOUNTER — Other Ambulatory Visit: Payer: Self-pay | Admitting: Nurse Practitioner

## 2021-08-24 DIAGNOSIS — R928 Other abnormal and inconclusive findings on diagnostic imaging of breast: Secondary | ICD-10-CM

## 2021-08-28 ENCOUNTER — Other Ambulatory Visit: Payer: Self-pay

## 2021-08-28 MED ORDER — UBRELVY 100 MG PO TABS: ORAL_TABLET | ORAL | 1 refills | Status: DC

## 2021-08-31 ENCOUNTER — Ambulatory Visit
Admission: RE | Admit: 2021-08-31 | Discharge: 2021-08-31 | Disposition: A | Discharge status: Home / Self Care | Payer: Commercial Managed Care - HMO | Source: Ambulatory Visit | Attending: Nurse Practitioner | Admitting: Nurse Practitioner

## 2021-08-31 ENCOUNTER — Other Ambulatory Visit: Payer: Self-pay | Admitting: Nurse Practitioner

## 2021-08-31 DIAGNOSIS — R928 Other abnormal and inconclusive findings on diagnostic imaging of breast: Secondary | ICD-10-CM

## 2021-08-31 DIAGNOSIS — N6320 Unspecified lump in the left breast, unspecified quadrant: Secondary | ICD-10-CM

## 2021-09-01 DIAGNOSIS — C50919 Malignant neoplasm of unspecified site of unspecified female breast: Secondary | ICD-10-CM

## 2021-09-01 HISTORY — DX: Malignant neoplasm of unspecified site of unspecified female breast: C50.919

## 2021-09-07 ENCOUNTER — Ambulatory Visit
Admission: RE | Admit: 2021-09-07 | Discharge: 2021-09-07 | Disposition: A | Discharge status: Home / Self Care | Payer: Commercial Managed Care - HMO | Source: Ambulatory Visit | Attending: Nurse Practitioner | Admitting: Nurse Practitioner

## 2021-09-07 DIAGNOSIS — N6320 Unspecified lump in the left breast, unspecified quadrant: Secondary | ICD-10-CM

## 2021-09-11 ENCOUNTER — Encounter: Payer: Self-pay | Admitting: *Deleted

## 2021-09-11 DIAGNOSIS — Z171 Estrogen receptor negative status [ER-]: Secondary | ICD-10-CM | POA: Insufficient documentation

## 2021-09-14 NOTE — Progress Notes (Signed)
Radiation Oncology         [phone] ________________________________  Name: Julie Townsend        MRN: [id number]  Date of Service: 09/16/2021 DOB: 23-Jan-1966  CC:Pcp, No  [name], MD     REFERRING PHYSICIAN: [name], MD   DIAGNOSIS: The encounter diagnosis was Malignant neoplasm of lower-outer quadrant of left breast of female, estrogen receptor negative ([place]).   HISTORY OF PRESENT ILLNESS: Julie Townsend is a 56 y.o. female seen in the multidisciplinary breast clinic for a new diagnosis of left breast cancer. The patient was noted to have a screening detected asymmetry in the left breast.  Further diagnostic work-up on 08/31/2021 showed a 1.3 cm mass in the left breast in the 5 o'clock position.  Her axilla was negative for adenopathy.  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  Her Ki-67 was 70%.  She is seen today to discuss treatment recommendations of her cancer.    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    correct Hands       PAST SURGICAL HISTORY: Past Surgical History:  Procedure Laterality Date   CARPAL TUNNEL RELEASE Right 06/20/2020   Procedure: CARPAL TUNNEL RELEASE;  Surgeon: [name], MD;  Location: [facility];  Service: Orthopedics;  Laterality: Right;   TRIGGER FINGER RELEASE Left 05/08/2020   Procedure: LEFT LONG FINGER TRIGGER RELEASE AND LEFT RING FINGER TRIGGER RELEASE;  Surgeon: [name], MD;  Location: [place];  Service: Orthopedics;  Laterality: Left;   trigger finger release rt hand       FAMILY HISTORY:  Family History  Problem Relation Age of Onset   Migraines Mother    Breast cancer Mother 64       recurr at  12   Migraines Maternal Aunt    Migraines Maternal  Grandmother      SOCIAL HISTORY:  reports that she has never smoked. She has never used smokeless tobacco. She reports that she does not drink alcohol and does not use drugs.  The patient is married and lives in [place].  She ***   ALLERGIES: Patient has no known allergies.   MEDICATIONS:  Current Outpatient Medications  Medication Sig Dispense Refill   Galcanezumab-gnlm (EMGALITY) 120 MG/ML SOAJ Inject 120 mg into the skin every 28 (twenty-eight) days. 3 mL 1   magnesium 30 MG tablet Take 30 mg by mouth 2 (two) times daily.     Omega-3 Fatty Acids (FISH OIL) 1000 MG CAPS Take by mouth.     SUMAtriptan (IMITREX) 50 MG tablet Take 1 tablet (50 mg total) by mouth as needed for migraine (May repeat after 2 hours.  Maximum 2 tablets in 24 hours). May repeat in 2 hours if headache persists or recurs. 30 tablet 1   Ubrogepant (UBRELVY) 100 MG TABS TAKE AS NEEDED (TAKE 1 AT THE EARLIST ONSET OF A MIGRAINE. MAY REPEAT IN 2 HOURS. MAX 2/24 HOURS). 30 tablet 1   No current facility-administered medications for this visit.     REVIEW OF SYSTEMS: On review of systems, the patient reports that she is doing ***     PHYSICAL EXAM:  Wt Readings from Last 3 Encounters:  05/21/21 114 lb 12.8 oz (52.1 kg)  11/06/20 118 lb (53.5 kg)  06/16/20  110 lb (49.9 kg)   Temp Readings from Last 3 Encounters:  06/20/20 97.6 F (36.4 C) (Oral)  05/08/20 97.6 F (36.4 C)   BP Readings from Last 3 Encounters:  05/21/21 125/79  11/06/20 128/77  06/20/20 140/80   Pulse Readings from Last 3 Encounters:  05/21/21 76  11/06/20 83  06/20/20 73    In general this is a well appearing *** female in no acute distress. She's alert and oriented x4 and appropriate throughout the examination. Cardiopulmonary assessment is negative for acute distress and she exhibits normal effort. Bilateral breast exam is deferred.    ECOG = ***  0 - Asymptomatic (Fully active, able to carry on all predisease activities  without restriction)  1 - Symptomatic but completely ambulatory (Restricted in physically strenuous activity but ambulatory and able to carry out work of a light or sedentary nature. For example, light housework, office work)  2 - Symptomatic, <50% in bed during the day (Ambulatory and capable of all self care but unable to carry out any work activities. Up and about more than 50% of waking hours)  3 - Symptomatic, >50% in bed, but not bedbound (Capable of only limited self-care, confined to bed or chair 50% or more of waking hours)  4 - Bedbound (Completely disabled. Cannot carry on any self-care. Totally confined to bed or chair)  5 - Death   Eustace Pen MM, Creech RH, Tormey DC, et al. [phone]). "Toxicity and response criteria of the [facility]". [place] Oncol. 5 (6): 649-55    LABORATORY DATA:  No results found for: "WBC", "HGB", "HCT", "MCV", "PLT" No results found for: "NA", "K", "CL", "CO2" No results found for: "ALT", "AST", "GGT", "ALKPHOS", "BILITOT"    RADIOGRAPHY: [place] LT BREAST BX W LOC DEV 1ST LESION IMG BX SPEC US GUIDE  Addendum Date: 09/08/2021   ADDENDUM REPORT: 09/08/2021 16:08 ADDENDUM: Pathology revealed GRADE 3 INVASIVE DUCTAL CARCINOMA of the LEFT breast, lower outer quadrant, 5 o'clock (ribbon clip). This was found to be concordant by Dr. Nolon Nations. Pathology results were discussed with the patient and her husband by telephone. The patient reported doing well after the biopsy with tenderness at the site. Post biopsy instructions and care were reviewed and questions were answered. The patient was encouraged to call [facility] for any additional concerns. The patient was referred to [facility] at [facility] on September 16, 2021. Pathology results reported by Stacie Acres RN on 09/08/2021. Electronically Signed   By: Nolon Nations M.D.   On: 09/08/2021 16:08    Result Date: 09/08/2021 CLINICAL DATA:  Patient presents for ultrasound-guided core biopsy of LEFT breast mass. EXAM: ULTRASOUND GUIDED LEFT BREAST CORE NEEDLE BIOPSY COMPARISON:  Previous exam(s). PROCEDURE: I met with the patient and we discussed the procedure of ultrasound-guided biopsy, including benefits and alternatives. We discussed the high likelihood of a successful procedure. We discussed the risks of the procedure, including infection, bleeding, tissue injury, clip migration, and inadequate sampling. Informed written consent was given. The usual time-out protocol was performed immediately prior to the procedure. Lesion quadrant: LOWER OUTER QUADRANT LEFT breast Using sterile technique and 1% Lidocaine as local anesthetic, under direct ultrasound visualization, a 12 gauge spring-loaded device was used to perform biopsy of mass in the 5 o'clock location of the LEFT breast 8 centimeters from nipple using a LATERAL to MEDIAL approach. At the conclusion of the procedure ribbon shaped  tissue marker clip was deployed into the biopsy cavity. Follow up 2 view mammogram was performed and dictated separately. IMPRESSION: Ultrasound guided biopsy of LEFT breast mass. No apparent complications. Electronically Signed: By: Nolon Nations M.D. On: 09/07/2021 10:13  MM CLIP PLACEMENT LEFT  Result Date: 09/07/2021 CLINICAL DATA:  Status post ultrasound-guided core biopsy of LEFT breast mass. EXAM: 3D DIAGNOSTIC LEFT MAMMOGRAM POST ULTRASOUND BIOPSY COMPARISON:  Previous exam(s). FINDINGS: 3D Mammographic images were obtained following ultrasound guided biopsy of mass in the 5 o'clock location of the LEFT breast and placement of a ribbon shaped. The biopsy marking clip is in expected position at the site of biopsy. IMPRESSION: Appropriate positioning of the ribbon shaped biopsy marking clip at the site of biopsy in the location. Final Assessment: Post Procedure Mammograms for Marker Placement Electronically Signed    By: Nolon Nations M.D.   On: 09/07/2021 10:34  MM DIAG BREAST TOMO UNI LEFT  Result Date: 08/31/2021 CLINICAL DATA:  56 year old female for further evaluation of possible LEFT breast asymmetry on screening mammogram. Mother diagnosed with breast cancer in her 83s. Strong family history of other cancers. EXAM: DIGITAL DIAGNOSTIC UNILATERAL LEFT MAMMOGRAM WITH TOMOSYNTHESIS; ULTRASOUND LEFT BREAST LIMITED TECHNIQUE: Left digital diagnostic mammography and breast tomosynthesis was performed.; Targeted ultrasound examination of the left breast was performed. COMPARISON:  Previous exam(s). ACR Breast Density Category b: There are scattered areas of fibroglandular density. FINDINGS: Full field and spot compression views of the LEFT breast demonstrate a persistent focal asymmetry within the posterior LOWER LEFT breast. Targeted ultrasound is performed, showing a 1 x 0.6 x 1.3 cm partially circumscribed partially indistinct complex cystic mass at the 5 o'clock position of the LEFT breast 8 cm from the nipple corresponding to the screening study finding. No abnormal LEFT axillary lymph nodes are noted. IMPRESSION: 1. Indeterminate 1.3 cm LOWER INNER LEFT breast mass. Tissue sampling is recommended. RECOMMENDATION: Ultrasound-guided LEFT breast biopsy, which will be scheduled. I have discussed the findings and recommendations with the patient. If applicable, a reminder letter will be sent to the patient regarding the next appointment. BI-RADS CATEGORY  4: Suspicious. Electronically Signed   By: Margarette Canada M.D.   On: 08/31/2021 11:15  US BREAST LTD UNI LEFT INC AXILLA  Result Date: 08/31/2021 CLINICAL DATA:  56 year old female for further evaluation of possible LEFT breast asymmetry on screening mammogram. Mother diagnosed with breast cancer in her 39s. Strong family history of other cancers. EXAM: DIGITAL DIAGNOSTIC UNILATERAL LEFT MAMMOGRAM WITH TOMOSYNTHESIS; ULTRASOUND LEFT BREAST LIMITED TECHNIQUE: Left  digital diagnostic mammography and breast tomosynthesis was performed.; Targeted ultrasound examination of the left breast was performed. COMPARISON:  Previous exam(s). ACR Breast Density Category b: There are scattered areas of fibroglandular density. FINDINGS: Full field and spot compression views of the LEFT breast demonstrate a persistent focal asymmetry within the posterior LOWER LEFT breast. Targeted ultrasound is performed, showing a 1 x 0.6 x 1.3 cm partially circumscribed partially indistinct complex cystic mass at the 5 o'clock position of the LEFT breast 8 cm from the nipple corresponding to the screening study finding. No abnormal LEFT axillary lymph nodes are noted. IMPRESSION: 1. Indeterminate 1.3 cm LOWER INNER LEFT breast mass. Tissue sampling is recommended. RECOMMENDATION: Ultrasound-guided LEFT breast biopsy, which will be scheduled. I have discussed the findings and recommendations with the patient. If applicable, a reminder letter will be sent to the patient regarding the next appointment. BI-RADS CATEGORY  4: Suspicious. Electronically Signed   By: Cleatis Polka.D.  On: 08/31/2021 11:15  MM 3D SCREEN BREAST BILATERAL  Result Date: 08/21/2021 CLINICAL DATA:  Screening. EXAM: DIGITAL SCREENING BILATERAL MAMMOGRAM WITH TOMOSYNTHESIS AND CAD TECHNIQUE: Bilateral screening digital craniocaudal and mediolateral oblique mammograms were obtained. Bilateral screening digital breast tomosynthesis was performed. The images were evaluated with computer-aided detection. COMPARISON:  Previous exam(s). ACR Breast Density Category b: There are scattered areas of fibroglandular density. FINDINGS: In the left breast, a possible asymmetry warrants further evaluation. In the right breast, no findings suspicious for malignancy. IMPRESSION: Further evaluation is suggested for possible asymmetry in the left breast. RECOMMENDATION: Diagnostic mammogram and possibly ultrasound of the left breast. (Code:FI-L-44M)  The patient will be contacted regarding the findings, and additional imaging will be scheduled. BI-RADS CATEGORY  0: Incomplete. Need additional imaging evaluation and/or prior mammograms for comparison. Electronically Signed   By: Margarette Canada M.D.   On: 08/21/2021 13:39       IMPRESSION/PLAN: 1. Stage IB, cT1cN0M0, grade 3, triple negative invasive ductal carcinoma of the left breast. Dr. Lisbeth Renshaw discusses the pathology findings and reviews the nature of *** breast disease. The consensus from the breast conference includes breast conservation with lumpectomy with  sentinel node biopsy. Dr. Lindi Adie recommends adjuvant chemtoherapy. Dr. Lisbeth Renshaw would then recommend external radiotherapy to the breast  to reduce risks of local recurrence. We discussed the risks, benefits, short, and long term effects of radiotherapy, as well as the curative intent, and the patient is interested in proceeding. Dr. Lisbeth Renshaw discusses the delivery and logistics of radiotherapy and anticipates a course of 4 or up to 6 1/2 weeks of radiotherapy to the left breast with deep inspiration breath-hold technique. We will see her back a few weeks after surgery to discuss the simulation process and anticipate we starting radiotherapy about 4-6 weeks after surgery.  2. Possible genetic predisposition to malignancy. The patient is a candidate for genetic testing given her personal and family history. She was offered referral and will meet genetics today in clinic.  In a visit lasting *** minutes, greater than 50% of the time was spent face to face reviewing her case, as well as in preparation of, discussing, and coordinating the patient's care.  The above documentation reflects my direct findings during this shared patient visit. Please see the separate note by Dr. Lisbeth Renshaw on this date for the remainder of the patient's plan of care.    Carola Rhine, [facility]    **Disclaimer: This note was dictated with voice recognition software. Similar  sounding words can inadvertently be transcribed and this note may contain transcription errors which may not have been corrected upon publication of note.**

## 2021-09-16 ENCOUNTER — Inpatient Hospital Stay (HOSPITAL_BASED_OUTPATIENT_CLINIC_OR_DEPARTMENT_OTHER): Payer: Commercial Managed Care - HMO | Admitting: Genetic Counselor

## 2021-09-16 ENCOUNTER — Other Ambulatory Visit: Payer: Self-pay

## 2021-09-16 ENCOUNTER — Encounter: Payer: Self-pay | Admitting: Physical Therapy

## 2021-09-16 ENCOUNTER — Inpatient Hospital Stay: Payer: Commercial Managed Care - HMO | Attending: Hematology and Oncology | Admitting: Hematology and Oncology

## 2021-09-16 ENCOUNTER — Encounter: Payer: Self-pay | Admitting: Hematology and Oncology

## 2021-09-16 ENCOUNTER — Other Ambulatory Visit: Payer: Self-pay | Admitting: Surgery

## 2021-09-16 ENCOUNTER — Ambulatory Visit
Admission: RE | Admit: 2021-09-16 | Discharge: 2021-09-16 | Disposition: A | Discharge status: Home / Self Care | Payer: Commercial Managed Care - HMO | Source: Ambulatory Visit | Attending: Radiation Oncology | Admitting: Radiation Oncology

## 2021-09-16 ENCOUNTER — Ambulatory Visit: Payer: Commercial Managed Care - HMO | Attending: Surgery | Admitting: Physical Therapy

## 2021-09-16 ENCOUNTER — Encounter: Payer: Self-pay | Admitting: *Deleted

## 2021-09-16 ENCOUNTER — Inpatient Hospital Stay: Payer: Commercial Managed Care - HMO

## 2021-09-16 ENCOUNTER — Encounter: Payer: Self-pay | Admitting: General Practice

## 2021-09-16 DIAGNOSIS — Z800 Family history of malignant neoplasm of digestive organs: Secondary | ICD-10-CM

## 2021-09-16 DIAGNOSIS — C50512 Malignant neoplasm of lower-outer quadrant of left female breast: Secondary | ICD-10-CM | POA: Insufficient documentation

## 2021-09-16 DIAGNOSIS — R293 Abnormal posture: Secondary | ICD-10-CM | POA: Insufficient documentation

## 2021-09-16 DIAGNOSIS — C50412 Malignant neoplasm of upper-outer quadrant of left female breast: Secondary | ICD-10-CM | POA: Insufficient documentation

## 2021-09-16 DIAGNOSIS — Z171 Estrogen receptor negative status [ER-]: Secondary | ICD-10-CM | POA: Insufficient documentation

## 2021-09-16 DIAGNOSIS — Z803 Family history of malignant neoplasm of breast: Secondary | ICD-10-CM

## 2021-09-16 DIAGNOSIS — Z853 Personal history of malignant neoplasm of breast: Secondary | ICD-10-CM

## 2021-09-16 DIAGNOSIS — Z8041 Family history of malignant neoplasm of ovary: Secondary | ICD-10-CM | POA: Insufficient documentation

## 2021-09-16 LAB — CBC WITH DIFFERENTIAL (CANCER CENTER ONLY)
Abs Immature Granulocytes: 0.01 10*3/uL (ref 0.00–0.07)
Basophils Absolute: 0.1 10*3/uL (ref 0.0–0.1)
Basophils Relative: 1 %
Eosinophils Absolute: 0.1 10*3/uL (ref 0.0–0.5)
Eosinophils Relative: 1 %
HCT: 40.1 % (ref 36.0–46.0)
Hemoglobin: 13.8 g/dL (ref 12.0–15.0)
Immature Granulocytes: 0 %
Lymphocytes Relative: 69 %
Lymphs Abs: 4.4 10*3/uL — ABNORMAL HIGH (ref 0.7–4.0)
MCH: 31.4 pg (ref 26.0–34.0)
MCHC: 34.4 g/dL (ref 30.0–36.0)
MCV: 91.3 fL (ref 80.0–100.0)
Monocytes Absolute: 0.4 10*3/uL (ref 0.1–1.0)
Monocytes Relative: 7 %
Neutro Abs: 1.4 10*3/uL — ABNORMAL LOW (ref 1.7–7.7)
Neutrophils Relative %: 22 %
Platelet Count: 192 10*3/uL (ref 150–400)
RBC: 4.39 MIL/uL (ref 3.87–5.11)
RDW: 12.4 % (ref 11.5–15.5)
Smear Review: NORMAL
WBC Count: 6.4 10*3/uL (ref 4.0–10.5)
nRBC: 0 % (ref 0.0–0.2)

## 2021-09-16 LAB — CMP (CANCER CENTER ONLY)
ALT: 37 U/L (ref 0–44)
AST: 43 U/L — ABNORMAL HIGH (ref 15–41)
Albumin: 3.8 g/dL (ref 3.5–5.0)
Alkaline Phosphatase: 65 U/L (ref 38–126)
Anion gap: 5 (ref 5–15)
BUN: 9 mg/dL (ref 6–20)
CO2: 29 mmol/L (ref 22–32)
Calcium: 9.3 mg/dL (ref 8.9–10.3)
Chloride: 106 mmol/L (ref 98–111)
Creatinine: 0.83 mg/dL (ref 0.44–1.00)
GFR, Estimated: >60 mL/min (ref >60)
Glucose, Bld: 91 mg/dL (ref 70–99)
Potassium: 4.1 mmol/L (ref 3.5–5.1)
Sodium: 140 mmol/L (ref 135–145)
Total Bilirubin: 0.2 mg/dL — ABNORMAL LOW (ref 0.3–1.2)
Total Protein: 7.1 g/dL (ref 6.5–8.1)

## 2021-09-16 LAB — GENETIC SCREENING ORDER

## 2021-09-16 NOTE — Progress Notes (Signed)
[place] Psychosocial Distress Screening Spiritual Care  Met with Julie Townsend in [facility] to introduce [facility] team/resources, reviewing distress screen per protocol.  The patient scored a 8 on the Psychosocial Distress Thermometer which indicates severe distress. Also assessed for distress and other psychosocial needs.    09/16/2021  ONCBCN DISTRESS SCREENING   Screening Type Initial Screening   Distress experienced in past week (1-10) 6   Emotional problem type Nervousness/Anxiety;Adjusting to illness   Information Concerns Type Lack of info about diagnosis;Lack of info about treatment   Physical Problem type Pain;Nausea/vomiting;Sleep/insomnia;Loss of appetitie   Referral to support programs Yes    Shadowed by Jaclyn Shaggy Pesci/Counseling Intern, visited with Julie Townsend and her husband and daughter in [place]. She reports distress at learning of her triple negative diagnosis, though meeting her team helped her feel more comfortable. She enjoys sewing, gardening, and knitting, which are sources of meaning and contribution for her.  Provided empathic listening, normalization of feelings, and introduction to [employer] support programming.  Follow up needed: No. Julie Townsend has chaplain's direct number and plans to reach out as needed/desired.   [place], [place], [facility] Pager [phone] Voicemail [phone]

## 2021-09-16 NOTE — Progress Notes (Signed)
[facility] NOTE  Patient Care Team: Pcp, No as PCP - General [name], RN as Oncology Nurse Navigator Mauro Kaufmann, RN as Oncology Nurse Navigator [name], MD as Consulting Physician (General Surgery) Nicholas Lose, MD as Consulting Physician (Hematology and Oncology) Kyung Rudd, MD as Consulting Physician (Radiation Oncology)  CHIEF COMPLAINTS/PURPOSE OF CONSULTATION:  Newly diagnosed breast cancer  HISTORY OF PRESENTING ILLNESS:  Julie Townsend 56 y.o. female is here because of recent diagnosis of left breast cancer.  She had a routine screening mammogram that detected left breast asymmetry posteriorly.  Measured 1.3 cm by ultrasound.  Biopsy revealed grade 3 IDC triple negative with a Ki-67 of 70%.  She was presented this morning the multidisciplinary breast clinic and she is here today accompanied by her husband and daughter to discuss her treatment plan.  I reviewed her records extensively and collaborated the history with the patient.  SUMMARY OF ONCOLOGIC HISTORY: Oncology History  Malignant neoplasm of lower-outer quadrant of left breast of female, estrogen receptor negative ([place])  09/07/2021 Initial Diagnosis   Screening mammogram detected left breast asymmetry posteriorly.  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    correct Hands    SURGICAL HISTORY: Past  Surgical History:  Procedure Laterality Date   CARPAL TUNNEL RELEASE Right 06/20/2020   Procedure: CARPAL TUNNEL RELEASE;  Surgeon: [name], MD;  Location: [facility];  Service: Orthopedics;  Laterality: Right;   TRIGGER FINGER RELEASE Left 05/08/2020   Procedure: LEFT LONG FINGER TRIGGER RELEASE AND LEFT RING FINGER TRIGGER RELEASE;  Surgeon: [name], MD;  Location: [place];  Service: Orthopedics;  Laterality: Left;   trigger finger release rt hand      SOCIAL HISTORY: Social History   Socioeconomic History   Marital status: Married    Spouse name: Not on file   Number of children: Not on file   Years of education: Not on file   Highest education level: Not on file  Occupational History   Not on file  Tobacco Use   Smoking status: Never   Smokeless tobacco: Never  Vaping Use   Vaping Use: Never used  Substance and Sexual Activity   Alcohol use: Never   Drug use: Never   Sexual activity: Yes  Other Topics Concern   Not on file  Social History Narrative   Lives with husband.   Right handed   Social Determinants of Health   Financial Resource Strain: Not on file  Food Insecurity: Not on file  Transportation Needs: Not on file  Physical Activity: Not on file  Stress: Not on file  Social Connections: Not on file  Intimate Partner Violence: Not on file    FAMILY HISTORY: Family History  Problem Relation Age of Onset   Migraines Mother    Breast cancer Mother 70  recurr at  20   Migraines Maternal Aunt    Colon cancer Maternal Uncle    Pancreatic cancer Maternal Uncle    Migraines Maternal Grandmother    Ovarian cancer Maternal Grandmother     ALLERGIES:  has No Known Allergies.  MEDICATIONS:  Current Outpatient Medications  Medication Sig Dispense Refill   Galcanezumab-gnlm (EMGALITY) 120 MG/ML SOAJ Inject 120 mg into the skin every 28 (twenty-eight) days. 3 mL 1   magnesium 30 MG tablet Take 30 mg by mouth 2  (two) times daily.     Omega-3 Fatty Acids (FISH OIL) 1000 MG CAPS Take by mouth.     SUMAtriptan (IMITREX) 50 MG tablet Take 1 tablet (50 mg total) by mouth as needed for migraine (May repeat after 2 hours.  Maximum 2 tablets in 24 hours). May repeat in 2 hours if headache persists or recurs. 30 tablet 1   Ubrogepant (UBRELVY) 100 MG TABS TAKE AS NEEDED (TAKE 1 AT THE EARLIST ONSET OF A MIGRAINE. MAY REPEAT IN 2 HOURS. MAX 2/24 HOURS). 30 tablet 1   No current facility-administered medications for this visit.    REVIEW OF SYSTEMS:   Constitutional: Denies fevers, chills or abnormal night sweats Breast:  Denies any palpable lumps or discharge All other systems were reviewed with the patient and are negative.  PHYSICAL EXAMINATION: ECOG PERFORMANCE STATUS: 0 - Asymptomatic  Vitals:   09/16/21 1301  BP: 116/88  Pulse: 82  Resp: 18  Temp: 97.9 F (36.6 C)  SpO2: 98%   Filed Weights   09/16/21 1301  Weight: 113 lb (51.3 kg)    GENERAL:alert, no distress and comfortable    LABORATORY DATA:  I have reviewed the data as listed Lab Results  Component Value Date   WBC 6.4 09/16/2021   HGB 13.8 09/16/2021   HCT 40.1 09/16/2021   MCV 91.3 09/16/2021   PLT 192 09/16/2021   Lab Results  Component Value Date   NA 140 09/16/2021   K 4.1 09/16/2021   CL 106 09/16/2021   CO2 29 09/16/2021    RADIOGRAPHIC STUDIES: I have personally reviewed the radiological reports and agreed with the findings in the report.  ASSESSMENT AND PLAN:  Malignant neoplasm of lower-outer quadrant of left breast of female, estrogen receptor negative ([place]) 09/07/2021:Screening mammogram detected left breast asymmetry posteriorly.  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, Ki-67 70%  Pathology and radiology counseling: Discussed with the patient, the details of pathology including the type of breast cancer,the clinical  staging, the significance of ER, PR and HER-2/neu receptors and the implications for treatment. After reviewing the pathology in detail, we proceeded to discuss the different treatment options between surgery, radiation, chemotherapy, antiestrogen therapies.  Treatment plan: 1.  Breast conserving surgery with sentinel lymph node biopsy 2. adjuvant chemotherapy with Adriamycin and Cytoxan followed by Taxol 3.  Adjuvant radiation  Chemotherapy Counseling: I discussed the risks and benefits of chemotherapy including the risks of nausea/ vomiting, risk of infection from low WBC count, fatigue due to chemo or anemia, bruising or bleeding due to low platelets, mouth sores, loss/ change in taste and decreased appetite. Liver and kidney function will be monitored through out chemotherapy as abnormalities in liver and kidney function may be a side effect of treatment. Cardiac dysfunction due to Adriamycin and neuropathy risk from Taxol were discussed in detail. Risk of permanent bone marrow dysfunction and leukemia  due to chemo were also discussed.  Return to clinic after surgery to discuss final pathology report    All questions were answered. The patient knows to call the clinic with any problems, questions or concerns.    Harriette Ohara, MD 09/16/21

## 2021-09-16 NOTE — Therapy (Signed)
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    correct Hands   Past Surgical History:  Procedure Laterality Date   CARPAL TUNNEL RELEASE Right 06/20/2020   Procedure: CARPAL TUNNEL RELEASE;  Surgeon: Julie Cover, MD;  Location: [place];  Service: Orthopedics;  Laterality: Right;   TRIGGER FINGER RELEASE Left 05/08/2020   Procedure: LEFT LONG FINGER TRIGGER RELEASE AND LEFT RING FINGER TRIGGER RELEASE;  Surgeon: Julie Cover, MD;  Location: [place];  Service: Orthopedics;  Laterality: Left;   trigger finger release rt hand     Patient Active Problem List   Diagnosis Date Noted   Malignant neoplasm of lower-outer quadrant of left breast of female, estrogen receptor negative ([place]) 09/11/2021    REFERRING PROVIDER: Dr. Coralie Townsend  REFERRING DIAG: Left breast cancer  THERAPY DIAG:  Malignant neoplasm of upper-outer quadrant of left breast in female, estrogen receptor negative ([place])  Abnormal posture  Rationale for Evaluation and Treatment Rehabilitation  ONSET DATE: 08/20/2021  SUBJECTIVE                                                                                                                                                                                           SUBJECTIVE STATEMENT: Patient reports she is here today to be seen by her medical  team for her newly diagnosed right breast cancer.   PERTINENT HISTORY:  Patient was diagnosed on 08/20/2021 with right grade 3 invasive ductal carcinoma breast cancer. It measures 1.3 cm and is located in the lower outer quadrant. It is triple negative with a Ki67 of 70%.   PATIENT GOALS   reduce lymphedema risk and learn post op HEP.   PAIN:  Are you having pain? No   PRECAUTIONS: Active CA   HAND DOMINANCE: right  WEIGHT BEARING RESTRICTIONS No  FALLS:  Has patient fallen in last 6 months? No  LIVING  ENVIRONMENT: Patient lives with: her husband and 2 adult children Lives in: House/apartment Has following equipment at home: None  OCCUPATION: full time; accounting  LEISURE: She does cardio 2-3x/week and lifts weights  PRIOR LEVEL OF FUNCTION: Independent   OBJECTIVE  COGNITION:  Overall cognitive status: Within functional limits for tasks assessed    POSTURE:  Forward head and rounded shoulders posture  UPPER EXTREMITY AROM/PROM:  A/PROM RIGHT   eval   Shoulder extension 54  Shoulder flexion 152  Shoulder abduction 152  Shoulder internal rotation 78  Shoulder external rotation 80    (Blank rows = not tested)  A/PROM LEFT   eval  Shoulder extension 58  Shoulder flexion 138  Shoulder abduction 158  Shoulder internal rotation 65  Shoulder external rotation 90    (Blank rows = not tested)   CERVICAL AROM: All within normal limits  UPPER EXTREMITY STRENGTH: WNL   LYMPHEDEMA ASSESSMENTS:   LANDMARK RIGHT   eval  10 cm proximal to olecranon process 23.6  Olecranon process 21.2  10 cm proximal to ulnar styloid process 18  Just proximal to ulnar styloid process 13.2  Across hand at thumb web space 17  At base of 2nd digit 5.4  (Blank rows = not tested)  LANDMARK LEFT   eval  10 cm proximal to olecranon process 21.8  Olecranon process 21.1  10 cm proximal to ulnar styloid process 17.2  Just proximal to ulnar styloid process 12.9  Across hand  at thumb web space 16.8  At base of 2nd digit 5.2  (Blank rows = not tested)   L-DEX LYMPHEDEMA SCREENING:  The patient was assessed using the L-Dex machine today to produce a lymphedema index baseline score. The patient will be reassessed on a regular basis (typically every 3 months) to obtain new L-Dex scores. If the score is > 6.5 points away from his/her baseline score indicating onset of subclinical lymphedema, it will be recommended to wear a compression garment for 4 weeks, 12 hours per day and then be reassessed. If the score continues to be > 6.5 points from baseline at reassessment, we will initiate lymphedema treatment. Assessing in this manner has a 95% rate of preventing clinically significant lymphedema.   L-DEX FLOWSHEETS - 09/16/21 1300       L-DEX LYMPHEDEMA SCREENING   Measurement Type Unilateral    L-DEX MEASUREMENT EXTREMITY Upper Extremity    POSITION  Standing    DOMINANT SIDE Right    At Risk Side Left    BASELINE SCORE (UNILATERAL) 3.8              QUICK DASH SURVEY:  Julie Townsend - 09/16/21 0001     Open a tight or new jar Moderate difficulty    Do heavy household chores (wash walls, wash floors) No difficulty    Carry a shopping bag or briefcase No difficulty    Wash your back No difficulty    Use a knife to cut food No difficulty    Recreational activities in which you take some force or impact through your arm, shoulder, or hand (golf, hammering, tennis) No difficulty    During the past week, to what extent has your arm, shoulder or hand problem interfered with your normal social activities with family, friends, neighbors, or groups? Not at all    During the past week, to what extent has your arm, shoulder or hand problem limited your work or other regular daily activities Not at all  Arm, shoulder, or hand pain. Moderate    Tingling (pins and needles) in your arm, shoulder, or hand None    Difficulty Sleeping Mild difficulty    DASH Score 11.36 %               PATIENT EDUCATION:  Education details: Lymphedema risk reduction and post op shoulder/posture HEP Person educated: Patient Education method: Explanation, Demonstration, Handout Education comprehension: Patient verbalized understanding and returned demonstration   HOME EXERCISE PROGRAM: Patient was instructed today in a home exercise program today for post op shoulder range of motion. These included active assist shoulder flexion in sitting, scapular retraction, wall walking with shoulder abduction, and hands behind head external rotation.  She was encouraged to do these twice a day, holding 3 seconds and repeating 5 times when permitted by her physician.   ASSESSMENT:  CLINICAL IMPRESSION: Patient was diagnosed on 08/20/2021 with right grade 3 invasive ductal carcinoma breast cancer. It measures 1.3 cm and is located in the lower outer quadrant. It is triple negative with a Ki67 of 70%. Her multidisciplinary medical team met prior to her assessments to determine a recommended treatment plan. She is planning to have a left lumpectomy and sentinel node biopsy, chemotherapy, and radiation. She will benefit from a post op PT reassessment to determine needs and from L-Dex screens every 3 months for 2 years to detect subclinical lymphedema.  Pt will benefit from skilled therapeutic intervention to improve on the following deficits: Decreased knowledge of precautions, impaired UE functional use, pain, decreased ROM, postural dysfunction.   PT treatment/interventions: ADL/self-care home management, pt/family education, therapeutic exercise  REHAB POTENTIAL: Excellent  CLINICAL DECISION MAKING: Stable/uncomplicated  EVALUATION COMPLEXITY: Low   GOALS: Goals reviewed with patient? YES  LONG TERM GOALS: (STG=LTG)    Name Target Date Goal status  1 Pt will be able to verbalize understanding of pertinent lymphedema risk reduction practices relevant to her dx specifically  related to skin care.  Baseline:  No knowledge 09/16/2021 Achieved at eval  2 Pt will be able to return demo and/or verbalize understanding of the post op HEP related to regaining shoulder ROM. Baseline:  No knowledge 09/16/2021 Achieved at eval  3 Pt will be able to verbalize understanding of the importance of attending the post op After Breast CA Class for further lymphedema risk reduction education and therapeutic exercise.  Baseline:  No knowledge 09/16/2021 Achieved at eval  4 Pt will demo she has regained full shoulder ROM and function post operatively compared to baselines.  Baseline: See objective measurements taken today. 11/11/2021      PLAN: PT FREQUENCY/DURATION: EVAL and 1 follow up appointment.   PLAN FOR NEXT SESSION: will reassess 3-4 weeks post op to determine needs.   Patient will follow up at outpatient cancer rehab 3-4 weeks following surgery.  If the patient requires physical therapy at that time, a specific plan will be dictated and sent to the referring physician for approval. The patient was educated today on appropriate basic range of motion exercises to begin post operatively and the importance of attending the After Breast Cancer class following surgery.  Patient was educated today on lymphedema risk reduction practices as it pertains to recommendations that will benefit the patient immediately following surgery.  She verbalized good understanding.    Physical Therapy Information for After Breast Cancer Surgery/Treatment:  Lymphedema is a swelling condition that you may be at risk for in your arm if you have lymph nodes removed from the armpit area.  After a sentinel node biopsy, the risk is approximately 5-9% and is higher after an axillary node dissection.  There is treatment available for this condition and it is not life-threatening.  Contact your physician or physical therapist with concerns. You may begin the 4 shoulder/posture exercises (see additional sheet) when  permitted by your physician (typically a week after surgery).  If you have drains, you may need to wait until those are removed before beginning range of motion exercises.  A general recommendation is to not lift your arms above shoulder height until drains are removed.  These exercises should be done to your tolerance and gently.  This is not a "no pain/no gain" type of recovery so listen to your body and stretch into the range of motion that you can tolerate, stopping if you have pain.  If you are having immediate reconstruction, ask your plastic surgeon about doing exercises as he or she may want you to wait. We encourage you to attend the free one time ABC (After Breast Cancer) class offered by [facility].  You will learn information related to lymphedema risk, prevention and treatment and additional exercises to regain mobility following surgery.  You can call [phone] for more information.  This is offered the 1st and 3rd Monday of each month.  You only attend the class one time. While undergoing any medical procedure or treatment, try to avoid blood pressure being taken or needle sticks from occurring on the arm on the side of cancer.   This recommendation begins after surgery and continues for the rest of your life.  This may help reduce your risk of getting lymphedema (swelling in your arm). An excellent resource for those seeking information on lymphedema is the National Lymphedema Network's web site. It can be accessed at [place].org If you notice swelling in your hand, arm or breast at any time following surgery (even if it is many years from now), please contact your doctor or physical therapist to discuss this.  Lymphedema can be treated at any time but it is easier for you if it is treated early on.  If you feel like your shoulder motion is not returning to normal in a reasonable amount of time, please contact your surgeon or physical therapist.  [facility] [phone]. [address], [address], [place] [address]  ABC CLASS After Breast Cancer Class  After Breast Cancer Class is a specially designed exercise class to assist you in a safe recover after having breast cancer surgery.  In this class you will learn how to get back to full function whether your drains were just removed or if you had surgery a month ago.  This one-time class is held the 1st and 3rd Monday of every month from 11:00 a.m. until 12:00 noon virtually.  This class is FREE and space is limited. For more information or to register for the next available class, call [phone].  Class Goals  Understand specific stretches to improve the flexibility of you chest and shoulder. Learn ways to safely strengthen your upper body and improve your posture. Understand the warning signs of infection and why you may be at risk for an arm infection. Learn about Lymphedema and prevention.  ** You do not attend this class until after surgery.  Drains must be removed to participate  Patient was instructed today in a home exercise program today for post op shoulder range of motion. These included active assist shoulder  flexion in sitting, scapular retraction, wall walking with shoulder abduction, and hands behind head external rotation.  She was encouraged to do these twice a day, holding 3 seconds and repeating 5 times when permitted by her physician.  Annia Friendly, [place] 09/16/21 3:37 PM

## 2021-09-16 NOTE — Research (Signed)
[employer] 2021-05 - Specimen Collection Study to Evaluate Biomarkers in Subjects with Cancer   Patient Julie Townsend was identified by Dr Lindi Adie as a potential candidate for the above listed study. This Clinical Research Nurse met with JASZMINE NAVEJAS, [id number], on 09/16/21 in a manner and location that ensures patient privacy to discuss participation in the above listed research study. Patient is Accompanied by her husband and daughter . A copy of the informed consent document with embedded HIPAA language was provided to the patient. Patient reads, speaks, and understands [place].    Patient was provided with the business card of this Nurse and encouraged to contact the research team with any questions. Approximately 10 minutes were spent with the patient reviewing the informed consent documents. Patient was provided the option of taking informed consent documents home to review and was encouraged to review at their convenience with their support network, including other care providers. Patient took the consent documents home to review.  Research will follow-up with patient on Monday, 8/21, to answer questions and further discuss participation.  Vickii Penna, RN, BSN, CPN Clinical Research Nurse I [phone]  09/16/2021 3:55 PM

## 2021-09-16 NOTE — Assessment & Plan Note (Signed)
09/07/2021:Screening mammogram detected left breast asymmetry posteriorly.  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, Ki-67 70%  Pathology and radiology counseling: Discussed with the patient, the details of pathology including the type of breast cancer,the clinical staging, the significance of ER, PR and HER-2/neu receptors and the implications for treatment. After reviewing the pathology in detail, we proceeded to discuss the different treatment options between surgery, radiation, chemotherapy, antiestrogen therapies.  Treatment plan: 1.  Breast conserving surgery with sentinel lymph node biopsy 2. adjuvant chemotherapy 3.  Adjuvant radiation  Chemotherapy Counseling: I discussed the risks and benefits of chemotherapy including the risks of nausea/ vomiting, risk of infection from low WBC count, fatigue due to chemo or anemia, bruising or bleeding due to low platelets, mouth sores, loss/ change in taste and decreased appetite. Liver and kidney function will be monitored through out chemotherapy as abnormalities in liver and kidney function may be a side effect of treatment. Cardiac dysfunction due to Adriamycin and neuropathy risk from Taxol were discussed in detail. Risk of permanent bone marrow dysfunction and leukemia due to chemo were also discussed.  Return to clinic after surgery to discuss final pathology report

## 2021-09-17 ENCOUNTER — Other Ambulatory Visit: Payer: Self-pay | Admitting: Surgery

## 2021-09-17 DIAGNOSIS — Z853 Personal history of malignant neoplasm of breast: Secondary | ICD-10-CM

## 2021-09-21 ENCOUNTER — Other Ambulatory Visit: Payer: Self-pay | Admitting: *Deleted

## 2021-09-21 DIAGNOSIS — C50512 Malignant neoplasm of lower-outer quadrant of left female breast: Secondary | ICD-10-CM

## 2021-09-22 ENCOUNTER — Ambulatory Visit (HOSPITAL_COMMUNITY)
Admission: RE | Admit: 2021-09-22 | Discharge: 2021-09-22 | Disposition: A | Discharge status: Home / Self Care | Payer: Commercial Managed Care - HMO | Source: Ambulatory Visit | Attending: Hematology and Oncology | Admitting: Hematology and Oncology

## 2021-09-22 ENCOUNTER — Inpatient Hospital Stay: Payer: Commercial Managed Care - HMO | Admitting: *Deleted

## 2021-09-22 ENCOUNTER — Encounter: Payer: Self-pay | Admitting: Genetic Counselor

## 2021-09-22 ENCOUNTER — Inpatient Hospital Stay: Payer: Commercial Managed Care - HMO

## 2021-09-22 ENCOUNTER — Other Ambulatory Visit: Payer: Self-pay

## 2021-09-22 ENCOUNTER — Encounter: Payer: Self-pay | Admitting: *Deleted

## 2021-09-22 DIAGNOSIS — Z0189 Encounter for other specified special examinations: Secondary | ICD-10-CM

## 2021-09-22 DIAGNOSIS — C50512 Malignant neoplasm of lower-outer quadrant of left female breast: Secondary | ICD-10-CM | POA: Diagnosis not present

## 2021-09-22 DIAGNOSIS — Z171 Estrogen receptor negative status [ER-]: Secondary | ICD-10-CM

## 2021-09-22 DIAGNOSIS — Z01818 Encounter for other preprocedural examination: Secondary | ICD-10-CM | POA: Insufficient documentation

## 2021-09-22 DIAGNOSIS — Z8041 Family history of malignant neoplasm of ovary: Secondary | ICD-10-CM

## 2021-09-22 DIAGNOSIS — Z800 Family history of malignant neoplasm of digestive organs: Secondary | ICD-10-CM

## 2021-09-22 DIAGNOSIS — Z803 Family history of malignant neoplasm of breast: Secondary | ICD-10-CM | POA: Insufficient documentation

## 2021-09-22 HISTORY — DX: Family history of malignant neoplasm of digestive organs: Z80.0

## 2021-09-22 HISTORY — DX: Family history of malignant neoplasm of breast: Z80.3

## 2021-09-22 HISTORY — DX: Family history of malignant neoplasm of ovary: Z80.41

## 2021-09-22 LAB — RESEARCH LABS

## 2021-09-22 LAB — ECHOCARDIOGRAM COMPLETE
Area-P 1/2: 3.48 cm2
S' Lateral: 2.7 cm

## 2021-09-22 NOTE — Progress Notes (Signed)
  Echocardiogram 2D Echocardiogram has been performed.  Darlina Sicilian M 09/22/2021, 9:45 AM

## 2021-09-22 NOTE — Progress Notes (Signed)
REFERRING PROVIDER: Nicholas Lose, [address],  [place] [address]   PRIMARY PROVIDER:  No PCP   PRIMARY REASON FOR VISIT:  1. Malignant neoplasm of lower-outer quadrant of left breast of female, estrogen receptor negative ([place])   2. Family history of breast cancer   3. Family history of pancreatic cancer   4. Family history of ovarian cancer     HISTORY OF PRESENT ILLNESS:   Ms. Julie Townsend, a 56 y.o. female, was seen for a [place] cancer genetics consultation during the breast multidisciplinary clinic at the request of Dr. Lindi Adie due to a personal and family history of cancer.  Ms. Mcculley presents to clinic today to discuss the possibility of a hereditary predisposition to cancer, to discuss genetic testing, and to further clarify her future cancer risks, as well as potential cancer risks for family members.   In August 2023, at the age of 34, Ms. Julie Townsend was diagnosed with invasive ductal carcinoma of the left breast (triple negative). The treatment plan includes breast conserving surgery, adjuvant chemotherapy, and adjuvant radiation.   CANCER HISTORY:  Oncology History  Malignant neoplasm of lower-outer quadrant of left breast of female, estrogen receptor negative ([place])  09/07/2021 Initial Diagnosis   Screening mammogram detected left breast asymmetry posteriorly.  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, Ki-67 70%   09/14/2021 Cancer Staging   Staging form: Breast, AJCC 8th Edition - Clinical stage from 09/14/2021: Stage IB (cT1c, cN0, cM0, G3, ER-, PR-, HER2-) - Signed by Hayden Pedro, PA-C on 09/14/2021 Stage prefix: Initial diagnosis Method of lymph node assessment: Clinical Histologic grading system: 3 grade system     Past Medical History:  Diagnosis Date   Breast cancer ([place])    Family history of breast cancer 09/22/2021   Family  history of ovarian cancer 09/22/2021   Family history of pancreatic cancer 09/22/2021   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  Significant diagnoses are listed below: Family History  Problem Relation Age of Onset   Breast cancer Mother 6       recurr at  45   Colon cancer Maternal Uncle        dx late 60s   Pancreatic cancer Maternal Uncle 28   Ovarian cancer Maternal Grandmother        dx 23s    [name] had limited information about her paternal family history. Ms. Gutknecht is unaware of previous family history of genetic testing for hereditary cancer risks. There is no reported Ashkenazi Jewish ancestry. There is no known consanguinity.  GENETIC COUNSELING ASSESSMENT: Ms. Orsini is a 56 y.o. female with a personal and family history of cancer which is somewhat suggestive of a hereditary cancer syndrome and predisposition to cancer given the  presence of related cancers in the family. We, therefore, discussed and recommended the following at today's visit.   DISCUSSION: We discussed that 5 - 10% of cancer is hereditary, with most cases of hereditary breast cancer associated with mutations in BRCA1/2.  There are other genes that can be associated with hereditary breast, ovarian, and pancreatic cancer syndromes.  Type of cancer risk and level of  risk are gene-specific. We discussed that testing is beneficial for several reasons including knowing how to follow individuals after completing their treatment, identifying whether potential treatment options would be beneficial, and understanding if other family members could be at risk for cancer and allowing them to undergo genetic testing.   We reviewed the characteristics, features and inheritance patterns of hereditary cancer syndromes. We also discussed genetic testing, including the appropriate family members to test, the process of testing, insurance coverage and turn-around-time for results. We discussed the implications of a negative, positive and/or variant of uncertain significant result. In order to get genetic test results in a timely manner so that Ms. Julie Townsend can use these genetic test results for surgical decisions, we recommended Ms. Julie Townsend pursue genetic testing for the [facility] BRCAPlus Panel.  The BRCAplus panel offered by [employer] and includes sequencing and deletion/duplication analysis for the following 8 genes: ATM, BRCA1, BRCA2, CDH1, CHEK2, PALB2, PTEN, and TP53. Once complete, we recommend Ms. Julie Townsend pursue reflex genetic testing to a more comprehensive gene panel.   The CancerNext-Expanded gene panel offered by [facility] and includes sequencing, rearrangement, and RNA analysis for the following 77 genes: AIP, ALK, APC, ATM, AXIN2, BAP1, BARD1, BLM, BMPR1A, BRCA1, BRCA2, BRIP1, CDC73, CDH1, CDK4, CDKN1B, CDKN2A, CHEK2, CTNNA1, DICER1, FANCC, FH, FLCN, GALNT12, KIF1B, LZTR1, MAX, MEN1, MET, MLH1, MSH2, MSH3, MSH6, MUTYH, NBN, NF1, NF2, NTHL1, PALB2, PHOX2B, PMS2, POT1, PRKAR1A, PTCH1, PTEN, RAD51C, RAD51D, RB1, RECQL, RET, SDHA, SDHAF2, SDHB, SDHC, SDHD, SMAD4, SMARCA4, SMARCB1, SMARCE1, STK11, SUFU, TMEM127, TP53, TSC1, TSC2, VHL and XRCC2 (sequencing and deletion/duplication); EGFR, EGLN1, HOXB13, KIT, MITF, PDGFRA, POLD1, and POLE (sequencing  only); EPCAM and GREM1 (deletion/duplication only).  k  Based on Ms. Julie Townsend's personal and family history of cancer, she meets medical criteria for genetic testing. Despite that she meets criteria, she may still have an out of pocket cost. We discussed that if her out of pocket cost for testing is over $100, the laboratory should contact them to discuss self-pay prices, patient pay assistance programs, if applicable, and other billing options.   PLAN: After considering the risks, benefits, and limitations, Ms. Julie Townsend provided informed consent to pursue genetic testing and the blood sample was sent to [facility] for analysis of the BRCAPlus and CancerNext-Expanded +RNAinsight. Results should be available within approximately 1-2 weeks' time, at which point they will be disclosed by telephone to Ms. Julie Townsend, as will any additional recommendations warranted by these results. Ms. Giuliano will receive a summary of her genetic counseling visit and a copy of her results once available. This information will also be available in Epic.   Ms. Julie Townsend's questions were answered to her satisfaction today. Our contact information was provided should additional questions or concerns arise. Thank you for the referral and allowing [place] to share in the care of your patient.   Ayron Fillinger M. Joette Catching, [place], [facility] Genetic Counselor [email or web address] (P) [phone]  The patient was seen for a total of 20 minutes in face-to-face genetic counseling.  Dr. Lindi Adie was available to discuss this case as needed.  _______________________________________________________________________

## 2021-09-22 NOTE — Research (Signed)
[employer] 2021-05 - Specimen Collection Study to Evaluate Biomarkers in Subjects with Cancer    09/22/2021  ELIGIBILITY:  This Coordinator has reviewed this patient's inclusion and exclusion criteria and confirmed Julie Townsend is eligible for study participation.  Patient will continue with enrollment.  Eligibility confirmed research nurse and by treating investigator, who also agrees that patient should proceed with enrollment.  Carol Ada, RT(R)(T) Clinical Research Coordinator

## 2021-09-22 NOTE — Research (Signed)
[employer] 2021-05 - Specimen Collection Study to Evaluate Biomarkers in Subjects with Cancer    Patient Julie Townsend was identified by Dr. Lindi Adie as a potential candidate for the above listed study.  This Clinical Research Nurse met with Julie Townsend, [id number] on 09/22/21 in a manner and location that ensures patient privacy to discuss participation in the above listed research study.  Patient is Accompanied by her husband .  Patient was previously provided with informed consent documents.  Patient confirmed they have read the informed consent documents.  As outlined in the informed consent form, this Nurse and Michelene Heady I Townsend discussed the purpose of the research study, study procedures and requirements for study participation, potential risks and benefits of study participation, as well as alternatives to participation.  This study is not blinded or double-blinded. The patient understands participation is voluntary and they may withdraw from study participation at any time.  This study does not involve randomization.  This study does not involve an investigational drug or device. This study does not involve a placebo. Patient understands enrollment is pending full eligibility review.   Confidentiality and how the patient's information will be used as part of study participation were discussed.  Patient was informed there is reimbursement provided for their time and effort spent on trial participation.   All questions were answered to patient's satisfaction.  The informed consent with embedded HIPAA language was reviewed page by page.  The patient's mental and emotional status is appropriate to provide informed consent, and the patient verbalizes an understanding of study participation.  Patient has agreed to participate in the above listed research study and has voluntarily signed the informed consent version [employer] approved version [id number] (revised [phone])   with embedded HIPAA language, version [id number] (revised on 30Jan2023)  on 09/22/21 at 10:00AM.  The patient was provided with a copy of the signed informed consent form with embedded HIPAA language for their reference.  No study specific procedures were obtained prior to the signing of the informed consent document.  Approximately 20 minutes were spent with the patient reviewing the informed consent documents.   Patient was not requested to complete a Release of Information form.   Eligibility: Eligibility criteria reviewed with the patient. This Nurse has reviewed this patient's inclusion and exclusion criteria and confirmed Julie Townsend is eligible for study participation.  Carol Ada, Research officer, political party, completed the 2nd review for this patient and confirmed the patient met all criteria for enrollment.  In addition, eligibility confirmed by treating investigator, Dr. Lindi Adie, who also agrees that patient should proceed with enrollment.  Patient will continue with enrollment.  The research nurse asked the patient the following questions about her current and past medical history.  The pt understands that this data is needed for study purposes.  Medical History:  High Blood Pressure  No Coronary Artery Disease No Lupus    No Rheumatoid Arthritis  No Diabetes   No      Lynch Syndrome  No  Is the patient currently taking a magnesium supplement?   No Does the patient have a personal history of cancer (greater than 5 years ago)?  No Does the patient have a family history of cancer in 1st or 2nd degree relatives? Yes 1st degree relative-Mother- breast cancer 2nd degree relatives include her grandmother (maternal)-ovarian cancer,  1 uncle (maternal) with pancreatic cancer, 1 uncle (maternal) with colon cancer   Does the patient have history of alcohol consumption? No   Does  the patient have history of cigarette, cigar, pipe, or chewing tobacco use?  No   Blood Collection:  Research  blood obtained by venipuncture per patient's preference.  Patient tolerated well without any adverse events.    Gift Card: $50 gift card given to patient for her participation in this study by Carol Ada, Research officer, political party.    The pt was thanked for her participation in this data and specimen study.  The research nurse will request her tumor block to submit to the study and enter her data.    Brion Aliment RN, BSN, CCRP Clinical Research Nurse Lead 09/22/2021 10:58 AM

## 2021-09-23 ENCOUNTER — Other Ambulatory Visit: Payer: Self-pay

## 2021-09-23 ENCOUNTER — Encounter (HOSPITAL_BASED_OUTPATIENT_CLINIC_OR_DEPARTMENT_OTHER): Payer: Self-pay | Admitting: Surgery

## 2021-09-24 ENCOUNTER — Telehealth: Payer: Self-pay | Admitting: *Deleted

## 2021-09-24 ENCOUNTER — Telehealth: Payer: Self-pay | Admitting: Genetic Counselor

## 2021-09-24 ENCOUNTER — Encounter: Payer: Self-pay | Admitting: *Deleted

## 2021-09-24 NOTE — Telephone Encounter (Signed)
Spoke to pt concerning [place] from 09/16/21. Denies questions or concerns regarding dx or treatment care plan. Pt waiting to hear on genetic results as this will change her sx plan. Currently scheduled for sx on 8/30. Informed pt this RN will reach out to genetic counselor Cari for an updated. Encourage pt to call with needs. Received verbal understanding. Contact information provided.

## 2021-09-24 NOTE — Telephone Encounter (Signed)
Julie Townsend informed that genetics BRCAP[facility]us results are still pending.  Expected release date is 8/27.  Will call when available.  Lab notified that results are needed by 8/28 at the latest and to update [place] if they anticipate any delays.

## 2021-09-26 ENCOUNTER — Telehealth: Payer: Self-pay | Admitting: Genetic Counselor

## 2021-09-26 ENCOUNTER — Ambulatory Visit: Payer: Self-pay | Admitting: Genetic Counselor

## 2021-09-26 ENCOUNTER — Encounter: Payer: Self-pay | Admitting: Genetic Counselor

## 2021-09-26 DIAGNOSIS — C50512 Malignant neoplasm of lower-outer quadrant of left female breast: Secondary | ICD-10-CM

## 2021-09-26 DIAGNOSIS — Z1501 Genetic susceptibility to malignant neoplasm of breast: Secondary | ICD-10-CM

## 2021-09-26 DIAGNOSIS — Z800 Family history of malignant neoplasm of digestive organs: Secondary | ICD-10-CM

## 2021-09-26 DIAGNOSIS — Z1379 Encounter for other screening for genetic and chromosomal anomalies: Secondary | ICD-10-CM

## 2021-09-26 DIAGNOSIS — Z803 Family history of malignant neoplasm of breast: Secondary | ICD-10-CM

## 2021-09-26 DIAGNOSIS — Z1509 Genetic susceptibility to other malignant neoplasm: Secondary | ICD-10-CM

## 2021-09-26 DIAGNOSIS — Z15068 Genetic susceptibility to other malignant neoplasm of digestive system: Secondary | ICD-10-CM | POA: Insufficient documentation

## 2021-09-26 DIAGNOSIS — Z8041 Family history of malignant neoplasm of ovary: Secondary | ICD-10-CM

## 2021-09-26 HISTORY — DX: Genetic susceptibility to malignant neoplasm of breast: Z15.01

## 2021-09-26 HISTORY — DX: Encounter for other screening for genetic and chromosomal anomalies: Z13.79

## 2021-09-26 NOTE — Telephone Encounter (Signed)
Revealed BRCA2 mutation in BRCAPlus Panel to Julie Townsend and husband, Julie Townsend.  Estimated chances of second breast cancer in next 15 years to be approximately 20% based on Kuchenbaecker et al (2017) and Pierre Bali al (2023).  Discussed option for bilateral mastectomies.  Reviewed availability of high risk screening (annual mammograms and breast MRIs) if she does not pursue bilateral mastectomies.  Patient stated that she is interested in further considering bilateral mastectomies.  Message sent to breast cancer team so that they can discuss options prior to her lumpectomy on 8/30.   Also briefly reviewed other cancer risks associated with BRCA2 and implications for family.  Told Julie Townsend that we will send her informational resources for her and details on how her family can be tested.   Of note, Julie Townsend states that this BRCA2 mutation may be hypomorphic (result in lower cancer risks compared to other BRCA2 mutations).  We stated that given her extensive family history of breast, ovarian, and pancreatic cancer in her maternal family, it is appropriate to manage this mutation as a high risk BRCA2 mutation.

## 2021-09-28 ENCOUNTER — Other Ambulatory Visit: Payer: Self-pay | Admitting: Surgery

## 2021-09-28 ENCOUNTER — Encounter: Payer: Self-pay | Admitting: *Deleted

## 2021-09-28 ENCOUNTER — Other Ambulatory Visit: Payer: Self-pay | Admitting: *Deleted

## 2021-09-28 DIAGNOSIS — Z01818 Encounter for other preprocedural examination: Secondary | ICD-10-CM

## 2021-09-28 DIAGNOSIS — C50512 Malignant neoplasm of lower-outer quadrant of left female breast: Secondary | ICD-10-CM

## 2021-09-30 ENCOUNTER — Ambulatory Visit (HOSPITAL_BASED_OUTPATIENT_CLINIC_OR_DEPARTMENT_OTHER): Admission: RE | Admit: 2021-09-30 | Payer: Commercial Managed Care - HMO | Source: Home / Self Care | Admitting: Surgery

## 2021-09-30 DIAGNOSIS — Z01818 Encounter for other preprocedural examination: Secondary | ICD-10-CM

## 2021-09-30 SURGERY — BREAST LUMPECTOMY WITH RADIOACTIVE SEED AND SENTINEL LYMPH NODE BIOPSY
Anesthesia: General | Site: Breast

## 2021-10-02 ENCOUNTER — Ambulatory Visit: Payer: Commercial Managed Care - HMO | Admitting: Plastic Surgery

## 2021-10-02 ENCOUNTER — Encounter: Payer: Self-pay | Admitting: Plastic Surgery

## 2021-10-02 VITALS — BP 144/87 | HR 92 | Ht 62.0 in | Wt 115.4 lb

## 2021-10-02 DIAGNOSIS — Z1501 Genetic susceptibility to malignant neoplasm of breast: Secondary | ICD-10-CM | POA: Diagnosis not present

## 2021-10-02 DIAGNOSIS — Z1509 Genetic susceptibility to other malignant neoplasm: Secondary | ICD-10-CM

## 2021-10-02 DIAGNOSIS — Z171 Estrogen receptor negative status [ER-]: Secondary | ICD-10-CM

## 2021-10-02 DIAGNOSIS — C50512 Malignant neoplasm of lower-outer quadrant of left female breast: Secondary | ICD-10-CM | POA: Diagnosis not present

## 2021-10-02 NOTE — Progress Notes (Signed)
Patient ID: Julie Townsend, female    DOB: 1965-03-12, 56 y.o.   MRN: [id number]   Chief Complaint  Patient presents with   Consult   Breast Problem   Breast Cancer    The patient is a 56 year old female here for consultation for breast reconstruction.  Her general surgeon is Dr. Magnus Ivan.  She was diagnosed with left breast cancer on a routine screening mammogram.  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  No lesion was noted at the 5 o'clock position measuring 1.3 cm.  Her past surgical history includes a carpal tunnel release, trigger finger release on both hands.  She is married and has a daughter.  She does not smoke and does not have diabetes.  She has a positive family history of breast cancer, colon cancer, pancreatic cancer and ovarian cancer.  She is 5 feet 2 inches tall and weighs 113 pounds.  She has bilateral ptosis and not likely to keep the areola.     Review of Systems  Constitutional: Negative.   HENT: Negative.    Eyes: Negative.   Respiratory: Negative.    Cardiovascular: Negative.   Gastrointestinal: Negative.   Endocrine: Negative.   Genitourinary: Negative.   Musculoskeletal: Negative.   Skin: Negative.     Past Medical History:  Diagnosis Date   BRCA2 gene mutation positive 09/26/2021   Breast cancer (HCC) 09/2021   left breast IDC   Family history of breast cancer 09/22/2021   Family history of ovarian cancer 09/22/2021   Family history of pancreatic cancer 09/22/2021   Genetic testing 09/26/2021   Pathogenic variant in BRCA2 at p.W1362* (c.3922G>T).  Report date is September 26, 2021.    The BRCAplus panel offered by [employer] and includes sequencing and deletion/duplication analysis for the following 8 genes: ATM, BRCA1, BRCA2, CDH1, CHEK2, PALB2, PTEN, and TP53.  Results of pan-cancer panel are pending.     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    correct Hands    Past Surgical History:  Procedure Laterality Date   CARPAL TUNNEL RELEASE Right 06/20/2020   Procedure: CARPAL TUNNEL RELEASE;  Surgeon: Betha Loa, MD;  Location: [facility];  Service: Orthopedics;  Laterality: Right;   TRIGGER FINGER RELEASE Left 05/08/2020   Procedure: LEFT LONG FINGER TRIGGER RELEASE AND LEFT RING FINGER TRIGGER RELEASE;  Surgeon: Betha Loa, MD;  Location: [facility];  Service: Orthopedics;  Laterality: Left;   trigger finger release rt hand        Current Outpatient Medications:    Galcanezumab-gnlm (EMGALITY) 120 MG/ML SOAJ, Inject 120 mg into the skin every 28 (twenty-eight) days., Disp: 3 mL, Rfl: 1   SUMAtriptan (IMITREX) 50 MG tablet, Take 1 tablet (50 mg total) by mouth as needed for migraine (May repeat after 2 hours.  Maximum 2 tablets in 24 hours). May repeat in 2 hours if headache persists or recurs., Disp: 30 tablet, Rfl: 1   Ubrogepant (UBRELVY) 100 MG TABS, TAKE AS NEEDED (TAKE 1 AT THE EARLIST ONSET OF A MIGRAINE. MAY REPEAT IN 2 HOURS. MAX 2/24 HOURS)., Disp: 30 tablet, Rfl: 1   Objective:   Vitals:   10/02/21 1320  BP: (!) 144/87  Pulse: 92  SpO2: 96%    Physical Exam Constitutional:      Appearance: Normal appearance. She is normal weight.  HENT:  Head: Normocephalic and atraumatic.  Cardiovascular:     Rate and Rhythm: Normal rate.     Pulses: Normal pulses.  Pulmonary:     Effort: Pulmonary effort is normal.  Abdominal:     General: There is no distension.     Palpations: Abdomen is soft.     Tenderness: There is no abdominal tenderness.  Musculoskeletal:        General: Normal range of motion.  Skin:    General: Skin is warm.  Neurological:     Mental Status: She is alert and oriented to person, place, and time.  Psychiatric:        Mood and Affect: Mood normal.        Behavior: Behavior normal.        Thought Content: Thought content normal.         Judgment: Judgment normal.    Assessment & Plan:  Malignant neoplasm of lower-outer quadrant of left breast of female, estrogen receptor negative (HCC)  BRCA2 gene mutation positive  The options for reconstruction we explained to the patient / family for breast reconstruction.  There are two general categories of reconstruction.  We can reconstruction a breast with implants or use the patient's own tissue.  These were further discussed as listed.  Breast reconstruction is an optional procedure and eligibility depends on the full spectrum of the health of the patient and any co-morbidities.  More than one surgery is often needed to complete the reconstruction process.  The process can take three to twelve months to complete.  The breasts will not be identical due to many factors such as rib differences, shoulder asymmetry and treatments such as radiation.  The goal is to get the breasts to look normal and symmetrical in clothes.  Scars are a part of surgery and may fade some in time but will always be present under clothes.  Surgery may be an option on the non-cancer breast to achieve more symmetry.  No matter which procedure is chosen there is always the risk of complications and even failure of the body to heal.  This could result in no breast.    The options for reconstruction include:  1. Placement of a tissue expander with Acellular dermal matrix. When the expander is the desired size surgery is performed to remove the expander and place an implant.  In some cases the implant can be placed without an expander.  2. Autologous reconstruction can include using a muscle or tissue from another area of the body to create a breast.  3. Combined procedures (ie. latissismus dorsi flap) can be done with an expander / implant placed under the muscle.   The risks, benefits, scars and recovery time were discussed for each of the above. Risks include bleeding, infection, hematoma, seroma, scarring, pain,  wound healing complications, flap loss, fat necrosis, capsular contracture, need for implant removal, donor site complications, bulge, hernia, umbilical necrosis, need for urgent reoperation, and need for dressing changes.   The procedure the patient selected / that was best for the patient, was then discussed in further detail.  Total time: 45 minutes. This includes time spent with the patient during the visit as well as time spent before and after the visit reviewing the chart, documenting the encounter, making phone calls and reviewing studies.   Due to the patient's size she is not a great candidate for autologous reconstruction.  She is in agreement and would like to do bilateral immediate breast reconstruction with expanders  and Flex HD.  We will not plan on keeping the nipples because of the ptosis.  I have discussed the above information with Dr. Ninfa Linden. Pictures were obtained of the patient and placed in the chart with the patient's or guardian's permission.   [place], DO

## 2021-10-08 ENCOUNTER — Other Ambulatory Visit: Payer: Self-pay | Admitting: Surgery

## 2021-10-12 ENCOUNTER — Telehealth: Payer: Self-pay | Admitting: *Deleted

## 2021-10-12 NOTE — Telephone Encounter (Signed)
Spoke to pt, informed appt with Dr. Lindi Adie on 9/13 will be cx until surgery has been r/s. Received verbal understanding. No questions verbalized at this time.

## 2021-10-14 ENCOUNTER — Inpatient Hospital Stay: Payer: Commercial Managed Care - HMO | Admitting: Hematology and Oncology

## 2021-10-14 ENCOUNTER — Inpatient Hospital Stay: Payer: Commercial Managed Care - HMO

## 2021-10-15 ENCOUNTER — Encounter: Payer: Self-pay | Admitting: *Deleted

## 2021-10-16 ENCOUNTER — Telehealth: Payer: Self-pay | Admitting: *Deleted

## 2021-10-16 NOTE — Telephone Encounter (Signed)
[employer] Pending [id number]  Clinicals faxed: This message was sent via [place], a product from [employer] [email or web address]                    -------Fax Transmission Report-------  To:               Recipient at [phone] Subject:          [Secure] Urgent Auth Request Clinicals - [id number], Perez-Valentine Result:           The transmission was successful. Explanation:      All Pages Ok Pages Sent:       23 Connect Time:     8 minutes, 3 seconds Transmit Time:    10/16/2021 10:04 Transfer Rate:    14400 Status Code:      0000 Retry Count:      0 Job Id:           [id number] Unique Id:        [id number] Fax Line:         49 Fax Server:       [id number]

## 2021-10-20 ENCOUNTER — Ambulatory Visit (INDEPENDENT_AMBULATORY_CARE_PROVIDER_SITE_OTHER): Payer: Commercial Managed Care - HMO | Admitting: Student

## 2021-10-20 VITALS — BP 134/86 | HR 76 | Temp 98.1°F | Resp 16 | Ht 62.0 in | Wt 114.0 lb

## 2021-10-20 DIAGNOSIS — C50512 Malignant neoplasm of lower-outer quadrant of left female breast: Secondary | ICD-10-CM

## 2021-10-20 DIAGNOSIS — Z171 Estrogen receptor negative status [ER-]: Secondary | ICD-10-CM

## 2021-10-20 MED ORDER — CEPHALEXIN 500 MG PO CAPS: 500.0000 mg | ORAL_CAPSULE | Freq: Four times a day (QID) | ORAL | 0 refills | Status: AC

## 2021-10-20 MED ORDER — OXYCODONE HCL 5 MG PO TABS: 5.0000 mg | ORAL_TABLET | Freq: Three times a day (TID) | ORAL | 0 refills | Status: DC | PRN

## 2021-10-20 MED ORDER — ONDANSETRON HCL 4 MG PO TABS: 4.0000 mg | ORAL_TABLET | Freq: Three times a day (TID) | ORAL | 0 refills | Status: DC | PRN

## 2021-10-20 MED ORDER — DIAZEPAM 2 MG PO TABS: 2.0000 mg | ORAL_TABLET | Freq: Two times a day (BID) | ORAL | 0 refills | Status: DC | PRN

## 2021-10-20 NOTE — Progress Notes (Signed)
   Patient ID: Julie Townsend, female    DOB: 07/23/1965, 56 y.o.   MRN: [id number]  Chief Complaint  Patient presents with   Pre-op Exam    No diagnosis found.   History of Present Illness: Julie Townsend is a 56 y.o.  female  with a history of left breast cancer.  She presents for preoperative evaluation for upcoming procedure, bilateral immediate breast reconstruction with placement of tissue expanders and Flex HD, scheduled for 10/29/21 with Dr. Dillingham.  Patient will also be undergoing left mastectomy with left sentinel lymph node biopsy, right total mastectomy and insertion of Port-A-Cath with Dr. Blackman on 10/29/2021.   The patient has not had problems with anesthesia.  Patient has history of left breast she denies any history of cardiac disease.  She states she does not take any blood thinners.  Patient reports she is not a smoker.  Patient denies being on any birth control or hormone replacement.  Patient reports she has had 1 miscarriage in the past.  Patient denies any personal or family history of blood clots or clotting diseases.  Patient denies any recent surgeries, traumas, infections, strokes or heart attacks.  She denies any history of inflammatory bowel disease or lung disease.  Patient denies any varicose veins.  Patient denies any recent fevers, chills or shortness of breath.  She denies any recent changes in her health.  Patient reports she has had anesthesia in the past and has not had any issues with it.  Patient states that she is currently a C cup and would like to be smaller after reconstruction.  Patient states that she receives steroid shots in her hand as needed.  She states the last time she received a steroid shot was last November.  Patient states that she does not plan on getting a steroid shot prior to surgery.  Summary of Previous Visit: Patient was seen in the clinic on 10/02/2021 by Dr. Dillingham for consult for breast reconstruction.   Patient reported she was diagnosed with left breast cancer on screening mammogram.  Patient reported she would like to do a bilateral immediate breast reconstruction with expanders and Flex HD.  It was discussed that she will not be able to keep her nipples due to ptosis of her breast.  Job: Accountant, works from home.  Patient states that she makes her own hours and can take off time as needed for work.  I discussed with the patient that if she does need any FMLA paperwork filled out or any work notes to let us know.  PMH Significant for: Carpal tunnel syndrome, breast cancer  Chemotherapy/radiation: Patient reports that her oncology team plans for her to start chemotherapy, she states she is unsure when she will be starting.  She states that they do not plan to do radiation at this time.   Past Medical History: Allergies: No Known Allergies  Current Medications:  Current Outpatient Medications:    Galcanezumab-gnlm (EMGALITY) 120 MG/ML SOAJ, Inject 120 mg into the skin every 28 (twenty-eight) days., Disp: 3 mL, Rfl: 1   SUMAtriptan (IMITREX) 50 MG tablet, Take 1 tablet (50 mg total) by mouth as needed for migraine (May repeat after 2 hours.  Maximum 2 tablets in 24 hours). May repeat in 2 hours if headache persists or recurs., Disp: 30 tablet, Rfl: 1   Ubrogepant (UBRELVY) 100 MG TABS, TAKE AS NEEDED (TAKE 1 AT THE EARLIST ONSET OF A MIGRAINE. MAY REPEAT IN 2 HOURS. MAX 2/24 HOURS).,   Disp: 30 tablet, Rfl: 1  Past Medical Problems: Past Medical History:  Diagnosis Date   BRCA2 gene mutation positive 09/26/2021   Breast cancer (HCC) 09/2021   left breast IDC   Family history of breast cancer 09/22/2021   Family history of ovarian cancer 09/22/2021   Family history of pancreatic cancer 09/22/2021   Genetic testing 09/26/2021   Pathogenic variant in BRCA2 at p.E1308* (c.3922G>T).  Report date is September 26, 2021.    The BRCAplus panel offered by [facility] and includes sequencing and  deletion/duplication analysis for the following 8 genes: ATM, BRCA1, BRCA2, CDH1, CHEK2, PALB2, PTEN, and TP53.  Results of pan-cancer panel are pending.     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    correct Hands    Past Surgical History: Past Surgical History:  Procedure Laterality Date   CARPAL TUNNEL RELEASE Right 06/20/2020   Procedure: CARPAL TUNNEL RELEASE;  Surgeon: Kuzma, Kevin, MD;  Location: [facility];  Service: Orthopedics;  Laterality: Right;   TRIGGER FINGER RELEASE Left 05/08/2020   Procedure: LEFT LONG FINGER TRIGGER RELEASE AND LEFT RING FINGER TRIGGER RELEASE;  Surgeon: Kuzma, Kevin, MD;  Location:  [facility]ERY [facility];  Service: Orthopedics;  Laterality: Left;   trigger finger release rt hand      Social History: Social History   Socioeconomic History   Marital status: Married    Spouse name: Not on file   Number of children: Not on file   Years of education: Not on file   Highest education level: Not on file  Occupational History   Not on file  Tobacco Use   Smoking status: Never   Smokeless tobacco: Never  Vaping Use   Vaping Use: Never used  Substance and Sexual Activity   Alcohol use: Never   Drug use: Never   Sexual activity: Yes    Birth control/protection: Post-menopausal  Other Topics Concern   Not on file  Social History Narrative   Lives with husband.   Right handed   Social Determinants of Health   Financial Resource Strain: Not on file  Food Insecurity: Not on file  Transportation Needs: Not on file  Physical Activity: Not on file  Stress: Not on file  Social Connections: Not on file  Intimate Partner Violence: Not on file    Family History: Family History  Problem Relation Age of Onset   Migraines Mother    Breast cancer Mother 56       recurr at  65   Migraines Maternal Aunt    Colon cancer Maternal Uncle        dx late 60s   Pancreatic cancer Maternal Uncle 28   Migraines  Maternal Grandmother    Ovarian cancer Maternal Grandmother        dx 50s    Review of Systems: Denies any recent fevers, chills, shortness of breath.  Denies any recent changes in her health.  Physical Exam: Vital Signs BP 134/86 (BP Location: Left Arm, Patient Position: Sitting, Cuff Size: Small)   Pulse 76   Temp 98.1 F (36.7 C) (Oral)   Resp 16   Ht 5' 2" (1.575 m)   Wt 114 lb (51.7 kg)   SpO2 98%   BMI 20.85 kg/m   Physical Exam  Constitutional:      General: Not in acute distress.    Appearance: Normal appearance. Not ill-appearing.  HENT:     Head: Normocephalic and atraumatic.    Neck:     Musculoskeletal: Normal range of motion.  Cardiovascular:     Rate and Rhythm: Normal rate Pulmonary:     Effort: Pulmonary effort is normal. No respiratory distress.  Skin:    General: Skin is warm and dry.     Findings: No erythema or rash.  Neurological:     Mental Status: Alert and oriented to person, place, and time. Mental status is at baseline.  Psychiatric:        Mood and Affect: Mood normal.        Behavior: Behavior normal.    Assessment/Plan: The patient is scheduled for bilateral breast reconstruction with placement of expanders and Flex HD with Dr. Dillingham.  Risks, benefits, and alternatives of procedure discussed, questions answered and consent obtained.    Smoking Status: Non-smoker; Counseling Given?  N/A Last Mammogram: 08/31/2021; Results: Lower left breast mass, BI-RADS Category 4: Suspicious, patient then underwent biopsy which showed invasive ductal carcinoma of the left breast  Caprini Score: 5; Risk Factors include: Age, history of malignancy and length of planned surgery. Recommendation for mechanical prophylaxis. Encourage early ambulation.   Pictures obtained: @consult  Post-op Rx sent to pharmacy: Oxycodone, Zofran, Keflex, Valium  I discussed with the patient to hold her multivitamins and herbal teas 1 week prior to surgery.  I discussed  with the patient to hold her sumatriptan and the day before surgery.  Patient expressed understanding.  Patient was provided with the breast reconstruction and General Surgical Risk consent document and Pain Medication Agreement prior to their appointment.  They had adequate time to read through the risk consent documents and Pain Medication Agreement. We also discussed them in person together during this preop appointment. All of their questions were answered to their satisfaction.  Recommended calling if they have any further questions.  Risk consent form and Pain Medication Agreement to be scanned into patient's chart.  The risks that can be encountered with and after placement of a breast expander placement were discussed and include the following but not limited to these: bleeding, infection, delayed healing, anesthesia risks, skin sensation changes, injury to structures including nerves, blood vessels, and muscles which may be temporary or permanent, allergies to tape, suture materials and glues, blood products, topical preparations or injected agents, skin contour irregularities, skin discoloration and swelling, deep vein thrombosis, cardiac and pulmonary complications, pain, which may persist, fluid accumulation, wrinkling of the skin over the expander, changes in nipple or breast sensation, expander leakage or rupture, faulty position of the expander, persistent pain, formation of tight scar tissue around the expander (capsular contracture), possible need for revisional surgery or staged procedures.   Electronically signed by: Hanley Woerner E Chanson Teems, PA-C 10/20/2021 12:23 PM  

## 2021-10-20 NOTE — H&P (View-Only) (Signed)
Patient ID: Julie Townsend, female    DOB: Jan 30, 1966, 56 y.o.   MRN: [id number]  Chief Complaint  Patient presents with   Pre-op Exam    No diagnosis found.   History of Present Illness: Julie Townsend is a 56 y.o.  female  with a history of left breast cancer.  She presents for preoperative evaluation for upcoming procedure, bilateral immediate breast reconstruction with placement of tissue expanders and Flex HD, scheduled for 10/29/21 with Dr. Marla Roe.  Patient will also be undergoing left mastectomy with left sentinel lymph node biopsy, right total mastectomy and insertion of Port-A-Cath with Dr. Ninfa Linden on 10/29/2021.   The patient has not had problems with anesthesia.  Patient has history of left breast she denies any history of cardiac disease.  She states she does not take any blood thinners.  Patient reports she is not a smoker.  Patient denies being on any birth control or hormone replacement.  Patient reports she has had 1 miscarriage in the past.  Patient denies any personal or family history of blood clots or clotting diseases.  Patient denies any recent surgeries, traumas, infections, strokes or heart attacks.  She denies any history of inflammatory bowel disease or lung disease.  Patient denies any varicose veins.  Patient denies any recent fevers, chills or shortness of breath.  She denies any recent changes in her health.  Patient reports she has had anesthesia in the past and has not had any issues with it.  Patient states that she is currently a C cup and would like to be smaller after reconstruction.  Patient states that she receives steroid shots in her hand as needed.  She states the last time she received a steroid shot was last November.  Patient states that she does not plan on getting a steroid shot prior to surgery.  Summary of Previous Visit: Patient was seen in the clinic on 10/02/2021 by Dr. Marla Roe for consult for breast reconstruction.   Patient reported she was diagnosed with left breast cancer on screening mammogram.  Patient reported she would like to do a bilateral immediate breast reconstruction with expanders and Flex HD.  It was discussed that she will not be able to keep her nipples due to ptosis of her breast.  Job: Optometrist, works from home.  Patient states that she makes her own hours and can take off time as needed for work.  I discussed with the patient that if she does need any FMLA paperwork filled out or any work notes to let us know.  PMH Significant for: Carpal tunnel syndrome, breast cancer  Chemotherapy/radiation: Patient reports that her oncology team plans for her to start chemotherapy, she states she is unsure when she will be starting.  She states that they do not plan to do radiation at this time.   Past Medical History: Allergies: No Known Allergies  Current Medications:  Current Outpatient Medications:    Galcanezumab-gnlm (EMGALITY) 120 MG/ML SOAJ, Inject 120 mg into the skin every 28 (twenty-eight) days., Disp: 3 mL, Rfl: 1   SUMAtriptan (IMITREX) 50 MG tablet, Take 1 tablet (50 mg total) by mouth as needed for migraine (May repeat after 2 hours.  Maximum 2 tablets in 24 hours). May repeat in 2 hours if headache persists or recurs., Disp: 30 tablet, Rfl: 1   Ubrogepant (UBRELVY) 100 MG TABS, TAKE AS NEEDED (TAKE 1 AT THE EARLIST ONSET OF A MIGRAINE. MAY REPEAT IN 2 HOURS. MAX 2/24 HOURS).,  Disp: 30 tablet, Rfl: 1  Past Medical Problems: Past Medical History:  Diagnosis Date   BRCA2 gene mutation positive 09/26/2021   Breast cancer ([place]) 09/2021   left breast IDC   Family history of breast cancer 09/22/2021   Family history of ovarian cancer 09/22/2021   Family history of pancreatic cancer 09/22/2021   Genetic testing 09/26/2021   Pathogenic variant in BRCA2 at p.B7628* (c.3922G>T).  Report date is September 26, 2021.    The BRCAplus panel offered by [employer] and includes sequencing and  deletion/duplication analysis for the following 8 genes: ATM, BRCA1, BRCA2, CDH1, CHEK2, PALB2, PTEN, and TP53.  Results of pan-cancer panel are pending.     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    correct Hands    Past Surgical History: Past Surgical History:  Procedure Laterality Date   CARPAL TUNNEL RELEASE Right 06/20/2020   Procedure: CARPAL TUNNEL RELEASE;  Surgeon: [name], MD;  Location: [place];  Service: Orthopedics;  Laterality: Right;   TRIGGER FINGER RELEASE Left 05/08/2020   Procedure: LEFT LONG FINGER TRIGGER RELEASE AND LEFT RING FINGER TRIGGER RELEASE;  Surgeon: [name], MD;  Location: [place];  Service: Orthopedics;  Laterality: Left;   trigger finger release rt hand      Social History: Social History   Socioeconomic History   Marital status: Married    Spouse name: Not on file   Number of children: Not on file   Years of education: Not on file   Highest education level: Not on file  Occupational History   Not on file  Tobacco Use   Smoking status: Never   Smokeless tobacco: Never  Vaping Use   Vaping Use: Never used  Substance and Sexual Activity   Alcohol use: Never   Drug use: Never   Sexual activity: Yes    Birth control/protection: Post-menopausal  Other Topics Concern   Not on file  Social History Narrative   Lives with husband.   Right handed   Social Determinants of Health   Financial Resource Strain: Not on file  Food Insecurity: Not on file  Transportation Needs: Not on file  Physical Activity: Not on file  Stress: Not on file  Social Connections: Not on file  Intimate Partner Violence: Not on file    Family History: Family History  Problem Relation Age of Onset   Migraines Mother    Breast cancer Mother 67       recurr at  9   Migraines Maternal Aunt    Colon cancer Maternal Uncle        dx late 39s   Pancreatic cancer Maternal Uncle 28   Migraines  Maternal Grandmother    Ovarian cancer Maternal Grandmother        dx 91s    Review of Systems: Denies any recent fevers, chills, shortness of breath.  Denies any recent changes in her health.  Physical Exam: Vital Signs BP 134/86 (BP Location: Left Arm, Patient Position: Sitting, Cuff Size: Small)   Pulse 76   Temp 98.1 F (36.7 C) (Oral)   Resp 16   Ht _0  (1.575 m)   Wt 114 lb (51.7 kg)   SpO2 98%   BMI 20.85 kg/m   Physical Exam  Constitutional:      General: Not in acute distress.    Appearance: Normal appearance. Not ill-appearing.  HENT:     Head: Normocephalic and atraumatic.  Neck:     Musculoskeletal: Normal range of motion.  Cardiovascular:     Rate and Rhythm: Normal rate Pulmonary:     Effort: Pulmonary effort is normal. No respiratory distress.  Skin:    General: Skin is warm and dry.     Findings: No erythema or rash.  Neurological:     Mental Status: Alert and oriented to person, place, and time. Mental status is at baseline.  Psychiatric:        Mood and Affect: Mood normal.        Behavior: Behavior normal.    Assessment/Plan: The patient is scheduled for bilateral breast reconstruction with placement of expanders and Flex HD with Dr. Marla Roe.  Risks, benefits, and alternatives of procedure discussed, questions answered and consent obtained.    Smoking Status: Non-smoker; Counseling Given?  N/A Last Mammogram: 08/31/2021; Results: Lower left breast mass, BI-RADS Category 4: Suspicious, patient then underwent biopsy which showed invasive ductal carcinoma of the left breast  Caprini Score: 5; Risk Factors include: Age, history of malignancy and length of planned surgery. Recommendation for mechanical prophylaxis. Encourage early ambulation.   Pictures obtained: _0   Post-op Rx sent to pharmacy: Oxycodone, Zofran, Keflex, Valium  I discussed with the patient to hold her multivitamins and herbal teas 1 week prior to surgery.  I discussed  with the patient to hold her sumatriptan and the day before surgery.  Patient expressed understanding.  Patient was provided with the breast reconstruction and General Surgical Risk consent document and Pain Medication Agreement prior to their appointment.  They had adequate time to read through the risk consent documents and Pain Medication Agreement. We also discussed them in person together during this preop appointment. All of their questions were answered to their satisfaction.  Recommended calling if they have any further questions.  Risk consent form and Pain Medication Agreement to be scanned into patient's chart.  The risks that can be encountered with and after placement of a breast expander placement were discussed and include the following but not limited to these: bleeding, infection, delayed healing, anesthesia risks, skin sensation changes, injury to structures including nerves, blood vessels, and muscles which may be temporary or permanent, allergies to tape, suture materials and glues, blood products, topical preparations or injected agents, skin contour irregularities, skin discoloration and swelling, deep vein thrombosis, cardiac and pulmonary complications, pain, which may persist, fluid accumulation, wrinkling of the skin over the expander, changes in nipple or breast sensation, expander leakage or rupture, faulty position of the expander, persistent pain, formation of tight scar tissue around the expander (capsular contracture), possible need for revisional surgery or staged procedures.   Electronically signed by: Clance Boll, PA-C 10/20/2021 12:23 PM

## 2021-10-22 ENCOUNTER — Encounter (HOSPITAL_BASED_OUTPATIENT_CLINIC_OR_DEPARTMENT_OTHER): Payer: Self-pay | Admitting: Surgery

## 2021-10-22 ENCOUNTER — Telehealth: Payer: Self-pay | Admitting: *Deleted

## 2021-10-22 ENCOUNTER — Other Ambulatory Visit: Payer: Self-pay

## 2021-10-22 NOTE — Telephone Encounter (Signed)
Auth submitted to [facility] - pending [phone] CPT [phone]

## 2021-10-28 MED ORDER — CHLORHEXIDINE GLUCONATE CLOTH 2 % EX PADS: 6.0000 | MEDICATED_PAD | Freq: Once | CUTANEOUS | Status: DC

## 2021-10-28 NOTE — Progress Notes (Signed)

## 2021-10-28 NOTE — H&P (Signed)
REFERRING PHYSICIAN: Gudena, Vinay K, MD  PROVIDER: DOUGLAS ALLEN BLACKMAN, MD  MRN: [id number] DOB: 04/10/1965  Subjective   Chief Complaint: Breast Cancer   History of Present Illness: Julie Townsend is a 56 y.o. female who is seenas an office consultation for evaluation of Breast Cancer .   This is a 56-year-old female who was found on recent screening mammography to have an asymmetry in her left breast. She underwent an ultrasound showing a 1.3 cm mass. Axilla ultrasound was negative. She underwent a biopsy of the mass showing an invasive ductal carcinoma which was ER and PR negative, HER2 negative, and had a Ki-67 of 70%. She has a family history of breast cancer in her mother who has had recurrent cancer as well. She is otherwise healthy without complaints. She denies nipple discharge. She has no cardiopulmonary problems  Review of Systems: A complete review of systems was obtained from the patient. I have reviewed this information and discussed as appropriate with the patient. See HPI as well for other ROS.  ROS   Medical History: Past Medical History:  Diagnosis Date  Arthritis  Migraines   Patient Active Problem List  Diagnosis  Malignant neoplasm of lower-outer quadrant of left breast of female, estrogen receptor negative (CMS-HCC)  Primary osteoarthritis of both first carpometacarpal joints   Past Surgical History:  Procedure Laterality Date  Trigger Finger Release Left 05/08/2020  ENDOSCOPIC CARPAL TUNNEL RELEASE Right 06/20/2020    No Known Allergies  Current Outpatient Medications on File Prior to Visit  Medication Sig Dispense Refill  EMGALITY PEN 120 mg/mL PnIj as directed  magnesium 30 mg tablet Take 30 mg by mouth 2 (two) times daily  omega-3 fatty acids/fish oil (FISH OIL) 340-1,000 mg capsule Take by mouth  SUMAtriptan (IMITREX) 50 MG tablet as directed  UBRELVY 100 mg Tab TAKE AS NEEDED (TAKE 1 AT THE EARLIST ONSET OF A MIGRAINE. MAY REPEAT IN 2  HOURS. MAX 2/24 HOURS).   No current facility-administered medications on file prior to visit.   Family History  Problem Relation Age of Onset  Breast cancer Mother  Migraines Mother  Migraines Maternal Aunt  Migraines Maternal Grandmother    Social History   Tobacco Use  Smoking Status Never  Smokeless Tobacco Never    Social History   Socioeconomic History  Marital status: Married  Tobacco Use  Smoking status: Never  Smokeless tobacco: Never  Vaping Use  Vaping Use: Unknown  Substance and Sexual Activity  Alcohol use: Defer  Drug use: Defer   Objective:  There were no vitals filed for this visit.  There is no height or weight on file to calculate BMI.  Physical Exam   She appears well on exam  There are no palpable breast masses. The nipple areolar complex is normal. There is no axillary adenopathy  Labs, Imaging and Diagnostic Testing: I reviewed her images including her mammograms and ultrasound and pathology results  Assessment and Plan:   Diagnoses and all orders for this visit:  Invasive ductal carcinoma of breast, left (CMS-HCC)    This is a patient with a triple negative left breast cancer. We have discussed her in our multidisciplinary breast cancer conference. From a surgical standpoint I discussed this with the patient and her family as well. Given her family history genetics has been ordered. I discussed breast conservation versus mastectomy. She would like to proceed with breast conservation unless she is genetically positive. From a conservative standpoint I discussed proceeding with a radioactive seed   guided left breast lumpectomy and sentinel lymph node biopsy. I explained the procedure to them in detail. We discussed the risk which includes but is not limited to bleeding, infection, injury to surrounding structures, the need for further procedures if margins or lymph nodes are positive, cardiopulmonary issues, etc. We also discussed Port-A-Cath  insertion as she will require this for chemotherapy. I explained this procedure as well. We discussed the risk of Port-A-Cath insertion which includes but is not limited to bleeding, infection, injury to surrounding structures, pneumothorax, the need for further procedures, etc. At this point, we will go ahead and tentatively schedule surgery but will change plans depending on her genetics. They understand and agree with the plans.   Addendum: The patient was referred to plastic surgery after she decided she want to proceed with bilateral mastectomies and reconstruction.  She has seen Dr. Dillingham and now the plan will be to proceed with bilateral total mastectomies with left axillary sentinel lymph node biopsy and Port-A-Cath insertion.  We have again discussed the risks which includes but is not limited to bleeding, infection, injury to surrounding structures including nerves and blood vessels in the axilla, pneumothorax insertion or injury to surrounding structures with Port-A-Cath insertion.  Etc.  She understands and agrees to proceed with surgery  

## 2021-10-29 ENCOUNTER — Ambulatory Visit (HOSPITAL_COMMUNITY): Payer: Commercial Managed Care - HMO

## 2021-10-29 ENCOUNTER — Ambulatory Visit (HOSPITAL_BASED_OUTPATIENT_CLINIC_OR_DEPARTMENT_OTHER): Payer: Commercial Managed Care - HMO | Admitting: Certified Registered"

## 2021-10-29 ENCOUNTER — Observation Stay (HOSPITAL_BASED_OUTPATIENT_CLINIC_OR_DEPARTMENT_OTHER)
Admission: RE | Admit: 2021-10-29 | Discharge: 2021-10-30 | Disposition: A | Discharge status: Home / Self Care | Payer: Commercial Managed Care - HMO | Attending: Plastic Surgery | Admitting: Plastic Surgery

## 2021-10-29 ENCOUNTER — Encounter (HOSPITAL_BASED_OUTPATIENT_CLINIC_OR_DEPARTMENT_OTHER): Payer: Self-pay | Admitting: Surgery

## 2021-10-29 ENCOUNTER — Encounter (HOSPITAL_BASED_OUTPATIENT_CLINIC_OR_DEPARTMENT_OTHER)
Admission: RE | Disposition: A | Discharge status: Home / Self Care | Payer: Self-pay | Source: Home / Self Care | Attending: Plastic Surgery

## 2021-10-29 DIAGNOSIS — Z171 Estrogen receptor negative status [ER-]: Secondary | ICD-10-CM | POA: Diagnosis not present

## 2021-10-29 DIAGNOSIS — C50912 Malignant neoplasm of unspecified site of left female breast: Secondary | ICD-10-CM | POA: Diagnosis not present

## 2021-10-29 DIAGNOSIS — Z421 Encounter for breast reconstruction following mastectomy: Secondary | ICD-10-CM

## 2021-10-29 DIAGNOSIS — C50919 Malignant neoplasm of unspecified site of unspecified female breast: Secondary | ICD-10-CM

## 2021-10-29 DIAGNOSIS — Z01818 Encounter for other preprocedural examination: Secondary | ICD-10-CM

## 2021-10-29 DIAGNOSIS — C50512 Malignant neoplasm of lower-outer quadrant of left female breast: Secondary | ICD-10-CM | POA: Diagnosis present

## 2021-10-29 HISTORY — PX: TOTAL MASTECTOMY: SHX6129

## 2021-10-29 HISTORY — PX: MASTECTOMY W/ SENTINEL NODE BIOPSY: SHX2001

## 2021-10-29 HISTORY — PX: BREAST RECONSTRUCTION WITH PLACEMENT OF TISSUE EXPANDER AND FLEX HD (ACELLULAR HYDRATED DERMIS): SHX6295

## 2021-10-29 HISTORY — PX: PORTACATH PLACEMENT: SHX2246

## 2021-10-29 SURGERY — MASTECTOMY WITH SENTINEL LYMPH NODE BIOPSY
Anesthesia: General | Site: Chest | Laterality: Right

## 2021-10-29 MED ORDER — HEPARIN (PORCINE) IN NACL 1000-0.9 UT/500ML-% IV SOLN
INTRAVENOUS | Status: AC
  Filled 2021-10-29: qty 500

## 2021-10-29 MED ORDER — CEFAZOLIN SODIUM-DEXTROSE 1-4 GM/50ML-% IV SOLN
1.0000 g | Freq: Three times a day (TID) | INTRAVENOUS | Status: DC
  Administered 2021-10-29 – 2021-10-30 (×2): 1 g via INTRAVENOUS
  Filled 2021-10-29 (×2): qty 50

## 2021-10-29 MED ORDER — HEPARIN SOD (PORK) LOCK FLUSH 100 UNIT/ML IV SOLN
INTRAVENOUS | Status: DC | PRN
  Administered 2021-10-29: 500 [IU] via INTRAVENOUS

## 2021-10-29 MED ORDER — ONDANSETRON 4 MG PO TBDP: 4.0000 mg | ORAL_TABLET | Freq: Four times a day (QID) | ORAL | Status: DC | PRN

## 2021-10-29 MED ORDER — FENTANYL CITRATE (PF) 100 MCG/2ML IJ SOLN
100.0000 ug | Freq: Once | INTRAMUSCULAR | Status: AC
  Administered 2021-10-29: 50 ug via INTRAVENOUS

## 2021-10-29 MED ORDER — KETOROLAC TROMETHAMINE 30 MG/ML IJ SOLN
30.0000 mg | Freq: Once | INTRAMUSCULAR | Status: AC
  Administered 2021-10-29: 30 mg via INTRAVENOUS

## 2021-10-29 MED ORDER — LIDOCAINE HCL (CARDIAC) PF 100 MG/5ML IV SOSY
PREFILLED_SYRINGE | INTRAVENOUS | Status: DC | PRN
  Administered 2021-10-29: 60 mg via INTRAVENOUS

## 2021-10-29 MED ORDER — PROMETHAZINE HCL 25 MG/ML IJ SOLN: 6.2500 mg | INTRAMUSCULAR | Status: DC | PRN

## 2021-10-29 MED ORDER — MIDAZOLAM HCL 2 MG/2ML IJ SOLN
INTRAMUSCULAR | Status: AC
  Filled 2021-10-29: qty 2

## 2021-10-29 MED ORDER — ALBUMIN HUMAN 5 % IV SOLN: INTRAVENOUS | Status: DC | PRN

## 2021-10-29 MED ORDER — SUGAMMADEX SODIUM 200 MG/2ML IV SOLN
INTRAVENOUS | Status: DC | PRN
  Administered 2021-10-29: 200 mg via INTRAVENOUS

## 2021-10-29 MED ORDER — ACETAMINOPHEN 500 MG PO TABS
1000.0000 mg | ORAL_TABLET | ORAL | Status: AC
  Administered 2021-10-29: 1000 mg via ORAL

## 2021-10-29 MED ORDER — PHENYLEPHRINE 80 MCG/ML (10ML) SYRINGE FOR IV PUSH (FOR BLOOD PRESSURE SUPPORT)
PREFILLED_SYRINGE | INTRAVENOUS | Status: DC | PRN
  Administered 2021-10-29: 80 ug via INTRAVENOUS
  Administered 2021-10-29: 160 ug via INTRAVENOUS
  Administered 2021-10-29 (×2): 80 ug via INTRAVENOUS
  Administered 2021-10-29: 160 ug via INTRAVENOUS
  Administered 2021-10-29 (×2): 80 ug via INTRAVENOUS
  Administered 2021-10-29: 160 ug via INTRAVENOUS
  Administered 2021-10-29: 80 ug via INTRAVENOUS

## 2021-10-29 MED ORDER — FENTANYL CITRATE (PF) 100 MCG/2ML IJ SOLN
INTRAMUSCULAR | Status: AC
  Filled 2021-10-29: qty 2

## 2021-10-29 MED ORDER — KETOROLAC TROMETHAMINE 30 MG/ML IJ SOLN
INTRAMUSCULAR | Status: AC
  Filled 2021-10-29: qty 1

## 2021-10-29 MED ORDER — SODIUM CHLORIDE 0.9 % IV SOLN
INTRAVENOUS | Status: DC | PRN
  Administered 2021-10-29: 500 mL

## 2021-10-29 MED ORDER — BUPIVACAINE-EPINEPHRINE (PF) 0.5% -1:200000 IJ SOLN
INTRAMUSCULAR | Status: AC
  Filled 2021-10-29: qty 30

## 2021-10-29 MED ORDER — DIPHENHYDRAMINE HCL 50 MG/ML IJ SOLN: 12.5000 mg | Freq: Four times a day (QID) | INTRAMUSCULAR | Status: DC | PRN

## 2021-10-29 MED ORDER — CEFAZOLIN SODIUM-DEXTROSE 2-4 GM/100ML-% IV SOLN
2.0000 g | INTRAVENOUS | Status: AC
  Administered 2021-10-29: 2 g via INTRAVENOUS

## 2021-10-29 MED ORDER — PROPOFOL 10 MG/ML IV BOLUS
INTRAVENOUS | Status: DC | PRN
  Administered 2021-10-29: 140 mg via INTRAVENOUS

## 2021-10-29 MED ORDER — ACETAMINOPHEN 10 MG/ML IV SOLN
1000.0000 mg | Freq: Once | INTRAVENOUS | Status: AC
  Administered 2021-10-29: 1000 mg via INTRAVENOUS

## 2021-10-29 MED ORDER — DIAZEPAM 2 MG PO TABS
2.0000 mg | ORAL_TABLET | Freq: Two times a day (BID) | ORAL | Status: DC | PRN
  Administered 2021-10-29: 2 mg via ORAL
  Filled 2021-10-29: qty 1

## 2021-10-29 MED ORDER — ROCURONIUM BROMIDE 10 MG/ML (PF) SYRINGE
PREFILLED_SYRINGE | INTRAVENOUS | Status: AC
  Filled 2021-10-29: qty 10

## 2021-10-29 MED ORDER — POLYETHYLENE GLYCOL 3350 17 G PO PACK: 17.0000 g | PACK | Freq: Every day | ORAL | Status: DC | PRN

## 2021-10-29 MED ORDER — ROCURONIUM BROMIDE 100 MG/10ML IV SOLN
INTRAVENOUS | Status: DC | PRN
  Administered 2021-10-29: 60 mg via INTRAVENOUS
  Administered 2021-10-29: 40 mg via INTRAVENOUS

## 2021-10-29 MED ORDER — CEFAZOLIN SODIUM-DEXTROSE 2-4 GM/100ML-% IV SOLN
INTRAVENOUS | Status: AC
  Filled 2021-10-29: qty 100

## 2021-10-29 MED ORDER — CHLORHEXIDINE GLUCONATE CLOTH 2 % EX PADS: 6.0000 | MEDICATED_PAD | Freq: Once | CUTANEOUS | Status: DC

## 2021-10-29 MED ORDER — IBUPROFEN 200 MG PO TABS
400.0000 mg | ORAL_TABLET | Freq: Four times a day (QID) | ORAL | Status: DC
  Filled 2021-10-29: qty 2

## 2021-10-29 MED ORDER — CLONIDINE HCL (ANALGESIA) 100 MCG/ML EP SOLN
EPIDURAL | Status: DC | PRN
  Administered 2021-10-29: 100 ug

## 2021-10-29 MED ORDER — PHENYLEPHRINE HCL-NACL 20-0.9 MG/250ML-% IV SOLN
INTRAVENOUS | Status: DC | PRN
  Administered 2021-10-29: 40 ug/min via INTRAVENOUS

## 2021-10-29 MED ORDER — MAGTRACE LYMPHATIC TRACER
INTRAMUSCULAR | Status: DC | PRN
  Administered 2021-10-29: 2 mL via INTRAMUSCULAR

## 2021-10-29 MED ORDER — PHENYLEPHRINE 80 MCG/ML (10ML) SYRINGE FOR IV PUSH (FOR BLOOD PRESSURE SUPPORT)
PREFILLED_SYRINGE | INTRAVENOUS | Status: AC
  Filled 2021-10-29: qty 20

## 2021-10-29 MED ORDER — CEFAZOLIN SODIUM-DEXTROSE 2-4 GM/100ML-% IV SOLN: 2.0000 g | INTRAVENOUS | Status: AC

## 2021-10-29 MED ORDER — HEPARIN SOD (PORK) LOCK FLUSH 100 UNIT/ML IV SOLN
INTRAVENOUS | Status: AC
  Filled 2021-10-29: qty 5

## 2021-10-29 MED ORDER — HYDROCODONE-ACETAMINOPHEN 5-325 MG PO TABS
1.0000 | ORAL_TABLET | ORAL | Status: DC | PRN
  Filled 2021-10-29 (×2): qty 1

## 2021-10-29 MED ORDER — MIDAZOLAM HCL 2 MG/2ML IJ SOLN
2.0000 mg | Freq: Once | INTRAMUSCULAR | Status: AC
  Administered 2021-10-29: 2 mg via INTRAVENOUS

## 2021-10-29 MED ORDER — MELATONIN 3 MG PO TABS: 3.0000 mg | ORAL_TABLET | Freq: Every evening | ORAL | Status: DC | PRN

## 2021-10-29 MED ORDER — PHENYLEPHRINE HCL (PRESSORS) 10 MG/ML IV SOLN
INTRAVENOUS | Status: AC
  Filled 2021-10-29: qty 1

## 2021-10-29 MED ORDER — DEXAMETHASONE SODIUM PHOSPHATE 4 MG/ML IJ SOLN
INTRAMUSCULAR | Status: DC | PRN
  Administered 2021-10-29: 8 mg via INTRAVENOUS

## 2021-10-29 MED ORDER — ONDANSETRON HCL 4 MG/2ML IJ SOLN
INTRAMUSCULAR | Status: AC
  Filled 2021-10-29: qty 2

## 2021-10-29 MED ORDER — FENTANYL CITRATE (PF) 100 MCG/2ML IJ SOLN
25.0000 ug | INTRAMUSCULAR | Status: DC | PRN
  Administered 2021-10-29 (×2): 50 ug via INTRAVENOUS

## 2021-10-29 MED ORDER — DEXAMETHASONE SODIUM PHOSPHATE 10 MG/ML IJ SOLN
INTRAMUSCULAR | Status: AC
  Filled 2021-10-29: qty 1

## 2021-10-29 MED ORDER — LACTATED RINGERS IV SOLN: INTRAVENOUS | Status: DC

## 2021-10-29 MED ORDER — ACETAMINOPHEN 325 MG PO TABS
325.0000 mg | ORAL_TABLET | Freq: Four times a day (QID) | ORAL | Status: DC
  Filled 2021-10-29: qty 1

## 2021-10-29 MED ORDER — DIPHENHYDRAMINE HCL 12.5 MG/5ML PO ELIX: 12.5000 mg | ORAL_SOLUTION | Freq: Four times a day (QID) | ORAL | Status: DC | PRN

## 2021-10-29 MED ORDER — PROPOFOL 10 MG/ML IV BOLUS
INTRAVENOUS | Status: AC
  Filled 2021-10-29: qty 20

## 2021-10-29 MED ORDER — BUPIVACAINE-EPINEPHRINE 0.5% -1:200000 IJ SOLN
INTRAMUSCULAR | Status: DC | PRN
  Administered 2021-10-29: 6 mL

## 2021-10-29 MED ORDER — SCOPOLAMINE 1 MG/3DAYS TD PT72
1.0000 | MEDICATED_PATCH | Freq: Once | TRANSDERMAL | Status: AC
  Administered 2021-10-29: 1 via TRANSDERMAL

## 2021-10-29 MED ORDER — ONDANSETRON HCL 4 MG/2ML IJ SOLN
INTRAMUSCULAR | Status: DC | PRN
  Administered 2021-10-29: 4 mg via INTRAVENOUS

## 2021-10-29 MED ORDER — KCL IN DEXTROSE-NACL 20-5-0.45 MEQ/L-%-% IV SOLN
INTRAVENOUS | Status: DC
  Filled 2021-10-29: qty 1000

## 2021-10-29 MED ORDER — SENNA 8.6 MG PO TABS
1.0000 | ORAL_TABLET | Freq: Two times a day (BID) | ORAL | Status: DC
  Filled 2021-10-29: qty 1

## 2021-10-29 MED ORDER — FENTANYL CITRATE (PF) 100 MCG/2ML IJ SOLN
INTRAMUSCULAR | Status: DC | PRN
  Administered 2021-10-29 (×2): 50 ug via INTRAVENOUS

## 2021-10-29 MED ORDER — HEPARIN (PORCINE) IN NACL 2-0.9 UNITS/ML
INTRAMUSCULAR | Status: AC | PRN
  Administered 2021-10-29: 1 via INTRAVENOUS

## 2021-10-29 MED ORDER — ACETAMINOPHEN 500 MG PO TABS
ORAL_TABLET | ORAL | Status: AC
  Filled 2021-10-29: qty 2

## 2021-10-29 MED ORDER — ONDANSETRON HCL 4 MG/2ML IJ SOLN: 4.0000 mg | Freq: Four times a day (QID) | INTRAMUSCULAR | Status: DC | PRN

## 2021-10-29 MED ORDER — ACETAMINOPHEN 10 MG/ML IV SOLN
INTRAVENOUS | Status: AC
  Filled 2021-10-29: qty 100

## 2021-10-29 MED ORDER — MORPHINE SULFATE (PF) 4 MG/ML IV SOLN: 2.0000 mg | INTRAVENOUS | Status: DC | PRN

## 2021-10-29 MED ORDER — OXYCODONE HCL 5 MG PO TABS
5.0000 mg | ORAL_TABLET | ORAL | Status: DC | PRN
  Administered 2021-10-29 (×2): 5 mg via ORAL
  Filled 2021-10-29 (×2): qty 1

## 2021-10-29 MED ORDER — BUPIVACAINE-EPINEPHRINE (PF) 0.5% -1:200000 IJ SOLN
INTRAMUSCULAR | Status: DC | PRN
  Administered 2021-10-29: 30 mL via PERINEURAL

## 2021-10-29 SURGICAL SUPPLY — 84 items
APPLIER CLIP 9.375 MED OPEN (MISCELLANEOUS) ×4
BAG DECANTER FOR FLEXI CONT (MISCELLANEOUS) ×4 IMPLANT
BINDER BREAST LRG (GAUZE/BANDAGES/DRESSINGS) IMPLANT
BINDER BREAST MEDIUM (GAUZE/BANDAGES/DRESSINGS) IMPLANT
BIOPATCH RED 1 DISK 7.0 (GAUZE/BANDAGES/DRESSINGS) ×4 IMPLANT
BLADE HEX COATED 2.75 (ELECTRODE) ×4 IMPLANT
BLADE SURG 10 STRL SS (BLADE) IMPLANT
BLADE SURG 11 STRL SS (BLADE) IMPLANT
BLADE SURG 15 STRL LF DISP TIS (BLADE) ×4 IMPLANT
BLADE SURG 15 STRL SS (BLADE) ×4
BNDG GAUZE DERMACEA FLUFF 4 (GAUZE/BANDAGES/DRESSINGS) ×8 IMPLANT
CANISTER SUCT 1200ML W/VALVE (MISCELLANEOUS) ×4 IMPLANT
CHLORAPREP W/TINT 26 (MISCELLANEOUS) ×4 IMPLANT
CLIP APPLIE 9.375 MED OPEN (MISCELLANEOUS) IMPLANT
COVER BACK TABLE 60X90IN (DRAPES) ×4 IMPLANT
COVER MAYO STAND STRL (DRAPES) ×4 IMPLANT
COVER PROBE W GEL 5X96 (DRAPES) ×4 IMPLANT
DERMABOND ADVANCED .7 DNX12 (GAUZE/BANDAGES/DRESSINGS) ×8 IMPLANT
DRAIN CHANNEL 19F RND (DRAIN) ×4 IMPLANT
DRAPE C-ARM 42X72 X-RAY (DRAPES) ×4 IMPLANT
DRAPE LAPAROSCOPIC ABDOMINAL (DRAPES) ×4 IMPLANT
DRAPE LAPAROTOMY 100X72 PEDS (DRAPES) IMPLANT
DRAPE UTILITY XL STRL (DRAPES) ×4 IMPLANT
DRSG OPSITE POSTOP 4X6 (GAUZE/BANDAGES/DRESSINGS) IMPLANT
ELECT BLADE 4.0 EZ CLEAN MEGAD (MISCELLANEOUS) ×4
ELECT REM PT RETURN 9FT ADLT (ELECTROSURGICAL) ×8
ELECTRODE BLDE 4.0 EZ CLN MEGD (MISCELLANEOUS) ×4 IMPLANT
ELECTRODE REM PT RTRN 9FT ADLT (ELECTROSURGICAL) ×4 IMPLANT
EVACUATOR SILICONE 100CC (DRAIN) ×4 IMPLANT
GAUZE PAD ABD 8X10 STRL (GAUZE/BANDAGES/DRESSINGS) ×8 IMPLANT
GAUZE SPONGE 4X4 12PLY STRL LF (GAUZE/BANDAGES/DRESSINGS) IMPLANT
GLOVE BIO SURGEON STRL SZ 6.5 (GLOVE) ×8 IMPLANT
GLOVE SURG SIGNA 7.5 PF LTX (GLOVE) ×4 IMPLANT
GOWN STRL REUS W/ TWL LRG LVL3 (GOWN DISPOSABLE) ×12 IMPLANT
GOWN STRL REUS W/ TWL XL LVL3 (GOWN DISPOSABLE) ×4 IMPLANT
GOWN STRL REUS W/TWL LRG LVL3 (GOWN DISPOSABLE) ×16
GOWN STRL REUS W/TWL XL LVL3 (GOWN DISPOSABLE) ×8
HEMOSTAT SURGICEL 2X14 (HEMOSTASIS) IMPLANT
IMPL EXPANDER BREAST 455CC (Breast) IMPLANT
IMPLANT EXPANDER BREAST 455CC (Breast) ×8 IMPLANT
IV NS 1000ML (IV SOLUTION) ×4
IV NS 1000ML BAXH (IV SOLUTION) IMPLANT
IV NS 500ML (IV SOLUTION)
IV NS 500ML BAXH (IV SOLUTION) ×4 IMPLANT
KIT FILL ASEPTIC TRANSFER (MISCELLANEOUS) IMPLANT
KIT PORT POWER 8FR ISP CVUE (Port) IMPLANT
LIGHT WAVEGUIDE WIDE FLAT (MISCELLANEOUS) ×4 IMPLANT
NDL 21 GA WING INFUSION (NEEDLE) IMPLANT
NDL HYPO 25X1 1.5 SAFETY (NEEDLE) ×8 IMPLANT
NEEDLE 21 GA WING INFUSION (NEEDLE) ×4 IMPLANT
NEEDLE HYPO 25X1 1.5 SAFETY (NEEDLE) ×8 IMPLANT
NS IRRIG 1000ML POUR BTL (IV SOLUTION) ×4 IMPLANT
PACK BASIN DAY SURGERY FS (CUSTOM PROCEDURE TRAY) ×4 IMPLANT
PAD FOAM SILICONE BACKED (GAUZE/BANDAGES/DRESSINGS) IMPLANT
PENCIL SMOKE EVACUATOR (MISCELLANEOUS) ×4 IMPLANT
PIN SAFETY STERILE (MISCELLANEOUS) ×4 IMPLANT
SLEEVE SCD COMPRESS KNEE MED (STOCKING) ×4 IMPLANT
SPONGE T-LAP 18X18 ~~LOC~~+RFID (SPONGE) ×8 IMPLANT
STRIP SUTURE WOUND CLOSURE 1/2 (MISCELLANEOUS) IMPLANT
SUT ETHILON 3 0 PS 1 (SUTURE) ×4 IMPLANT
SUT MNCRL AB 4-0 PS2 18 (SUTURE) ×4 IMPLANT
SUT MON AB 3-0 SH 27 (SUTURE) ×8
SUT MON AB 3-0 SH27 (SUTURE) ×4 IMPLANT
SUT MON AB 5-0 PS2 18 (SUTURE) IMPLANT
SUT PDS 3-0 CT2 (SUTURE)
SUT PDS AB 2-0 CT2 27 (SUTURE) IMPLANT
SUT PDS II 3-0 CT2 27 ABS (SUTURE) IMPLANT
SUT PROLENE 2 0 SH DA (SUTURE) ×4 IMPLANT
SUT SILK 2 0 TIES 17X18 (SUTURE)
SUT SILK 2-0 18XBRD TIE BLK (SUTURE) IMPLANT
SUT SILK 3 0 PS 1 (SUTURE) ×4 IMPLANT
SUT VIC AB 2-0 SH 27 (SUTURE) ×4
SUT VIC AB 2-0 SH 27XBRD (SUTURE) IMPLANT
SUT VIC AB 3-0 SH 27 (SUTURE) ×4
SUT VIC AB 3-0 SH 27X BRD (SUTURE) ×4 IMPLANT
SYR BULB IRRIG 60ML STRL (SYRINGE) ×4 IMPLANT
SYR CONTROL 10ML LL (SYRINGE) ×8 IMPLANT
TISSUE FLEXHD PERF PLIAB 6X16 (Tissue) IMPLANT
TOWEL GREEN STERILE FF (TOWEL DISPOSABLE) ×8 IMPLANT
TRACER MAGTRACE VIAL (MISCELLANEOUS) IMPLANT
TRAY DSU PREP LF (CUSTOM PROCEDURE TRAY) ×4 IMPLANT
TUBE CONNECTING 20X1/4 (TUBING) ×4 IMPLANT
UNDERPAD 30X36 HEAVY ABSORB (UNDERPADS AND DIAPERS) ×8 IMPLANT
YANKAUER SUCT BULB TIP NO VENT (SUCTIONS) ×4 IMPLANT

## 2021-10-29 NOTE — Progress Notes (Signed)
Assisted Dr. Turk with left, pectoralis, ultrasound guided block. Side rails up, monitors on throughout procedure. See vital signs in flow sheet. Tolerated Procedure well. 

## 2021-10-29 NOTE — Anesthesia Procedure Notes (Signed)
Anesthesia Regional Block: Pectoralis block   Pre-Anesthetic Checklist: , timeout performed,  Correct Patient, Correct Site, Correct Laterality,  Correct Procedure, Correct Position, site marked,  Risks and benefits discussed,  Surgical consent,  Pre-op evaluation,  At surgeon's request and post-op pain management  Laterality: Left  Prep: chloraprep       Needles:  Injection technique: Single-shot  Needle Type: Echogenic Needle     Needle Length: 9cm  Needle Gauge: 21     Additional Needles:   Procedures:,,,, ultrasound used (permanent image in chart),,    Narrative:  Start time: 10/29/2021 11:53 AM End time: 10/29/2021 12:00 PM Injection made incrementally with aspirations every 5 mL.  Performed by: Personally  Anesthesiologist: Santa Lighter, MD  Additional Notes: No pain on injection. No increased resistance to injection. Injection made in 5cc increments.  Good needle visualization.  Patient tolerated procedure well.

## 2021-10-29 NOTE — Op Note (Addendum)
Op report    DATE OF OPERATION:  10/29/2021  LOCATION: [facility]  SURGICAL DIVISION: Plastic Surgery  PREOPERATIVE DIAGNOSES:  1. Left Breast cancer, estrogen negative lower outer quadrant.    POSTOPERATIVE DIAGNOSES:  Same as preoperative diagnosis  PROCEDURE:  1. Bilateral immediate breast reconstruction with placement of Acellular Dermal Matrix and tissue expanders.  SURGEON: Karli Wickizer Sanger Moe Graca, DO  ASSISTANT: Dr. Loren Racer  ANESTHESIA:  General.   COMPLICATIONS: None.   IMPLANTS: Left - Mentor Artora Plus 455 cc. Ref #[id number].  Serial Number [id number], 100 cc of injectable saline placed in the expander. Right - Mentor Artora Plus 455cc. Ref #[id number].  Serial Number [id number], 100 cc of injectable saline placed in the expander. Acellular Dermal Matrix 6 x 16 cm x two Flex HD  INDICATIONS FOR PROCEDURE:  The patient, Julie Townsend, is a 56 y.o. female born on [birth year], is here for  immediate first stage breast reconstruction with placement of bilateral tissue expander and Acellular dermal matrix. MRN: [id number]  CONSENT:  Informed consent was obtained directly from the patient. Risks, benefits and alternatives were fully discussed. Specific risks including but not limited to bleeding, infection, hematoma, seroma, scarring, pain, implant infection, implant extrusion, capsular contracture, asymmetry, wound healing problems, and need for further surgery were all discussed. The patient did have an ample opportunity to have her questions answered to her satisfaction.   DESCRIPTION OF PROCEDURE:  The patient was taken to the operating room by the general surgery team. SCDs were placed and IV antibiotics were given. The patient's chest was prepped and draped in a sterile fashion. A time out was performed and the implants to be used were identified.  Bilateral mastectomies were performed.  Once the general surgery team had completed  their portion of the case the patient was rendered to the plastic and reconstructive surgery team.  Right:  The pectoralis major muscle was lifted from the chest wall with release of the lateral edge and lateral inframammary fold.  The pocket was irrigated with antibiotic solution and hemostasis was achieved with electrocautery.  The ADM was then prepared according to the manufacture guidelines and slits placed to help with postoperative fluid management.  The ADM was then sutured to the inferior and lateral edge of the inframammary fold with 2-0 PDS starting with an interrupted stitch and then a running stitch.  The lateral portion was sutured to with interrupted sutures after the expander was placed.  The expander was prepared according to the manufacture guidelines, the air evacuated and then it was placed under the ADM and pectoralis major muscle.  The inferior and lateral tabs were used to secure the expander to the chest wall with 2-0 PDS.  The drain was placed at the inframammary fold over the ADM and secured to the skin with 3-0 Silk.  The deep layers were closed with 3-0 Monocryl followed by 4-0 Monocryl.  The skin was closed with 5-0 Monocryl and then dermabond was applied.    Left: he pectoralis major muscle was lifted from the chest wall with release of the lateral edge and lateral inframammary fold.  The pocket was irrigated with antibiotic solution and hemostasis was achieved with electrocautery.  The ADM was then prepared according to the manufacture guidelines and slits placed to help with postoperative fluid management.  The ADM was then sutured to the inferior and lateral edge of the inframammary fold with 2-0 PDS starting with an interrupted stitch and then a running stitch.  The lateral portion was sutured to with interrupted sutures after the expander was placed.  The expander was prepared according to the manufacture guidelines, the air evacuated and then it was placed under the ADM and  pectoralis major muscle.  The inferior and lateral tabs were used to secure the expander to the chest wall with 2-0 PDS.  The drain was placed at the inframammary fold over the ADM and secured to the skin with 3-0 Silk.  The deep layers were closed with 3-0 Monocryl followed by 4-0 Monocryl.  The skin was closed with 5-0 Monocryl and then dermabond was applied.  The ABDs and breast binder were placed.  The patient tolerated the procedure well and there were no complications.  The patient was allowed to wake from anesthesia and taken to the recovery room in satisfactory condition.   Dr. Loren Racer assisted throughout the case. Dr. Ladona Ridgel was essential in retraction and counter traction when needed to make the case progress smoothly.  This retraction and assistance made it possible to see the tissue plans for the procedure.  The assistance was needed for blood control, tissue re-approximation and assisted with closure of the incision site.

## 2021-10-29 NOTE — Interval H&P Note (Signed)
History and Physical Interval Note:  10/29/2021 1:36 PM  Julie Townsend  has presented today for surgery, with the diagnosis of LEFT BREAST CANCER.  The various methods of treatment have been discussed with the patient and family. After consideration of risks, benefits and other options for treatment, the patient has consented to  Procedure(s): LEFT MASTECTOMY WITH LEFT SENTINEL LYMPH NODE BIOPSY (Left) RIGHT TOTAL MASTECTOMY (Right) INSERTION PORT-A-CATH (N/A) BREAST RECONSTRUCTION WITH PLACEMENT OF TISSUE EXPANDER AND FLEX HD (ACELLULAR HYDRATED DERMIS) (Bilateral) as a surgical intervention.  The patient's history has been reviewed, patient examined, no change in status, stable for surgery.  I have reviewed the patient's chart and labs.  Questions were answered to the patient's satisfaction.     Loel Lofty [name]

## 2021-10-29 NOTE — Transfer of Care (Signed)
Immediate Anesthesia Transfer of Care Note  Patient: Bradd Burner Perez-Valentine  Procedure(s) Performed: LEFT MASTECTOMY WITH LEFT SENTINEL LYMPH NODE BIOPSY (Left: Breast) RIGHT TOTAL MASTECTOMY (Right: Breast) INSERTION PORT-A-CATH (Right: Chest) BREAST RECONSTRUCTION WITH PLACEMENT OF TISSUE EXPANDER AND FLEX HD (ACELLULAR HYDRATED DERMIS) (Bilateral)  Patient Location: PACU  Anesthesia Type:GA combined with regional for post-op pain  Level of Consciousness: drowsy and patient cooperative  Airway & Oxygen Therapy: Patient Spontanous Breathing and Patient connected to face mask oxygen  Post-op Assessment: Report given to RN and Post -op Vital signs reviewed and stable  Post vital signs: Reviewed and stable  Last Vitals:  Vitals Value Taken Time  BP    Temp    Pulse 74 10/29/21 1706  Resp    SpO2 100 % 10/29/21 1706  Vitals shown include unvalidated device data.  Last Pain:  Vitals:   10/29/21 1052  TempSrc: Oral  PainSc: 0-No pain         Complications: No notable events documented.

## 2021-10-29 NOTE — Op Note (Signed)
Julie Townsend 10/29/2021   Pre-op Diagnosis: LEFT BREAST CANCER     Post-op Diagnosis: same  Procedure(s): BILATERAL TOTAL MASTECTOMY DEEP LEFT AXILLARY SENTINEL LYMPH NODE BIOPSY INJECTION OF MAGTRACE FOR LYMPH NODE MAPPING INSERTION RIGHT INTERNAL JUGULAR PORT-A CATH WITH ULTRASOUND GUIDANCE (8 FR)  Anesthesia: General  Staff:  Circulator: Izora Ribas, RN; Anson Crofts, RN Physician Assistant: Corena Herter, PA-C Radiology Technologist: Luetta Nutting Relief Circulator: Patric Dykes, RN Scrub Person: Falcon, Racheal T  Estimated Blood Loss:                Specimens: SENT TO PATH  Indications: This is a 56 year old female who was found on screening mammography to have a mass in her left breast.  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  After discussion with the patient and her family, she has wished to go ahead and proceed with bilateral total mastectomies with a left axillary sentinel lymph node biopsy and Port-A-Cath insertion for chemotherapy  Procedure: The patient was brought to the operating room and identifies the correct patient.  She was placed in a supine position on the operating room table and general anesthesia was induced.  Under sterile technique I injected mag trace with a 25-gauge needle underneath the left nipple areolar complex and massaged the breast.   Her right chest and neck were then prepped and draped in the usual sterile fashion.  Using the ultrasound probe identified the right internal jugular vein.  Under ultrasound guidance using the introducer needle I cannulated the vein.  I passed a wire through the needle and into the central venous system under direct fluoroscopy.  An 8 [place] catheter brought to the field.  The port and catheter were flushed with heparinized saline.  I anesthetized the skin of the right chest with Marcaine and then made a  longitudinal incision with a scalpel.  I then created a pocket for the port.  I then used a scalpel to make a small incision at the wire site on the neck.  The catheter was connected to the port.  I placed the port into the pocket on the right chest and then using the tunneling device tunneled the catheter over the clavicle and out of the neck incision.  I then passed the introducer sheath and venous dilator over the wire and into the central venous system easily.  The wire and dilator were removed.  I cut the catheter an appropriate length.  I then fed the catheter down the peel-away sheath and into the central venous system.  The sheath was peeled away leaving the catheter in place.  I accessed the port and good flush and return were demonstrated.  Fluoroscopy confirmed placement in the superior vena cava.  I then instilled concentrated heparin solution into the port.  I sutured it to the chest wall with a single 3-0 Prolene suture.  I then closed the subcutaneous tissue with interrupted 3-0 Vicryl sutures and closed both incisions with 4-0 Monocryl sutures.  Dermabond was then applied. We removed all drapes.  Her breasts and axilla were then prepped and draped in the usual sterile fashion. I turned my attention toward the right breast first.  With a scalpel I performed an elliptical incision across the chest incorporating the nipple areolar complex.  I then dissected circumferentially down to the breast tissue with electrocautery.  I dissected out the superior skin flap staying just underneath  the skin and dermis and dissected toward the level of the upper chest and clavicle.  I dissected laterally toward the axilla and medially toward the sternum.  I then dissected the inferior skin flap staying underneath the skin and dermis going down to the inframammary ridge.  I then again used the cautery to slowly dissect the breast off the pectoralis fascia dissecting medial to lateral with the electrocautery.  Once the  breast was completely removed just the pectoralis muscles I took the specimen and marked out laterally with a silk suture and the breast was then sent to pathology.  Hemostasis appeared to be achieved with cautery along the chest wall.  We then placed a gauze at the mastectomy site on the right. I next turned my attention toward the left breast.  I again performed a similar apical incision medial to lateral incorporating the nipple areolar complex.  I then took this down to the breast tissue circumferentially with electrocautery.  I dissected the superior skin flap first going toward the upper chest and clavicle and then down to the chest wall with the cautery.  I dissected medially toward the sternum and laterally toward the axilla.  I then dissected the inferior skin flap staying just underneath the skin and dermis going down to the inframammary ridge.  I then dissected the breast tissue off of the pectoralis fascia with the cautery dissecting medial to laterally toward the axilla.  I then completed the mastectomy and again marked the lateral margin with a suture. I then dissected down into the deep left axillary tissue.  Using the mag trace probe identified several lymph nodes.  One was enlarged.  Using the cautery and surgical clips, I removed several lymph nodes with the cautery which were then sent to pathology for evaluation as well.  Hemostasis appeared to be achieved in the left axilla with the cautery and surgical clips.  Hemostasis also appeared to be achieved on the chest wall.  A laparotomy pad was then placed at this site as well.  Dr. Marla Roe then presented to the room for her portion of the procedure including the immediate reconstruction.  She remained intubated and in stable condition.          [name]   Date: 10/29/2021  Time: 3:17 PM

## 2021-10-29 NOTE — Anesthesia Preprocedure Evaluation (Signed)
Anesthesia Evaluation  Patient identified by MRN, date of birth, ID band Patient awake    Reviewed: Allergy & Precautions, NPO status , Patient's Chart, lab work & pertinent test results  Airway Mallampati: II  TM Distance: >3 FB Neck ROM: Full    Dental  (+) Teeth Intact, Dental Advisory Given   Pulmonary neg pulmonary ROS,    Pulmonary exam normal breath sounds clear to auscultation       Cardiovascular negative cardio ROS Normal cardiovascular exam Rhythm:Regular Rate:Normal     Neuro/Psych  Headaches,    GI/Hepatic negative GI ROS, Neg liver ROS,   Endo/Other  negative endocrine ROS  Renal/GU negative Renal ROS     Musculoskeletal  (+) Arthritis ,   Abdominal   Peds  Hematology negative hematology ROS (+)   Anesthesia Other Findings Day of surgery medications reviewed with the patient.  LEFT BREAST CANCER  Reproductive/Obstetrics                             Anesthesia Physical Anesthesia Plan  ASA: 2  Anesthesia Plan: General   Post-op Pain Management: Regional block* and Tylenol PO (pre-op)*   Induction: Intravenous  PONV Risk Score and Plan: 3 and Scopolamine patch - Pre-op, Midazolam, Dexamethasone and Ondansetron  Airway Management Planned: Oral ETT  Additional Equipment:   Intra-op Plan:   Post-operative Plan: Extubation in OR  Informed Consent: I have reviewed the patients History and Physical, chart, labs and discussed the procedure including the risks, benefits and alternatives for the proposed anesthesia with the patient or authorized representative who has indicated his/her understanding and acceptance.     Dental advisory given  Plan Discussed with: CRNA  Anesthesia Plan Comments:         Anesthesia Quick Evaluation

## 2021-10-29 NOTE — Interval H&P Note (Signed)
History and Physical Interval Note: no change in H and P  10/29/2021 12:13 PM  Julie Townsend  has presented today for surgery, with the diagnosis of LEFT BREAST CANCER.  The various methods of treatment have been discussed with the patient and family. After consideration of risks, benefits and other options for treatment, the patient has consented to  Procedure(s): LEFT MASTECTOMY WITH LEFT SENTINEL LYMPH NODE BIOPSY (Left) RIGHT TOTAL MASTECTOMY (Right) INSERTION PORT-A-CATH (N/A) BREAST RECONSTRUCTION WITH PLACEMENT OF TISSUE EXPANDER AND FLEX HD (ACELLULAR HYDRATED DERMIS) (Bilateral) as a surgical intervention.  The patient's history has been reviewed, patient examined, no change in status, stable for surgery.  I have reviewed the patient's chart and labs.  Questions were answered to the patient's satisfaction.     [name]

## 2021-10-29 NOTE — Anesthesia Procedure Notes (Signed)
Procedure Name: Intubation Date/Time: 10/29/2021 1:14 PM  Performed by: Bufford Spikes, CRNAPre-anesthesia Checklist: Patient identified, Emergency Drugs available, Suction available and Patient being monitored Patient Re-evaluated:Patient Re-evaluated prior to induction Oxygen Delivery Method: Circle system utilized Preoxygenation: Pre-oxygenation with 100% oxygen Induction Type: IV induction Ventilation: Mask ventilation without difficulty Laryngoscope Size: Miller and 2 Grade View: Grade II Tube type: Oral Tube size: 7.0 mm Number of attempts: 1 Airway Equipment and Method: Stylet and Oral airway Placement Confirmation: ETT inserted through vocal cords under direct vision, positive ETCO2 and breath sounds checked- equal and bilateral Secured at: 21 cm Tube secured with: Tape Dental Injury: Teeth and Oropharynx as per pre-operative assessment

## 2021-10-29 NOTE — Discharge Instructions (Addendum)
You will be called with the pathology results as soon as they become available     [facility] [place] [phone]  MASTECTOMY: POST OP INSTRUCTIONS  Always review your discharge instruction sheet given to you by the facility where your surgery was performed. IF YOU HAVE DISABILITY OR FAMILY LEAVE FORMS, YOU MUST BRING THEM TO THE OFFICE FOR PROCESSING.   DO NOT GIVE THEM TO YOUR DOCTOR. A prescription for pain medication may be given to you upon discharge.  Take your pain medication as prescribed, if needed.  If narcotic pain medicine is not needed, then you may take acetaminophen (Tylenol) or ibuprofen (Advil) as needed. Take your usually prescribed medications unless otherwise directed. If you need a refill on your pain medication, please contact your pharmacy.  They will contact our office to request authorization.  Prescriptions will not be filled after 5pm or on week-ends. You should follow a light diet the first few days after arrival home, such as soup and crackers, etc.  Resume your normal diet the day after surgery. Most patients will experience some swelling and bruising on the chest and underarm.  Ice packs will help.  Swelling and bruising can take several days to resolve.  It is common to experience some constipation if taking pain medication after surgery.  Increasing fluid intake and taking a stool softener (such as Colace) will usually help or prevent this problem from occurring.  A mild laxative (Milk of Magnesia or Miralax) should be taken according to package instructions if there are no bowel movements after 48 hours. Unless discharge instructions indicate otherwise, leave your bandage dry and in place until your next appointment in 3-5 days.  You may take a limited sponge bath.  No tube baths or showers until the drains are removed.  You may have steri-strips (small skin tapes) in place directly over the incision.  These strips should be left on the skin for  7-10 days.  If your surgeon used skin glue on the incision, you may shower in 24 hours.  The glue will flake off over the next 2-3 weeks.  Any sutures or staples will be removed at the office during your follow-up visit. DRAINS:  If you have drains in place, it is important to keep a list of the amount of drainage produced each day in your drains.  Before leaving the hospital, you should be instructed on drain care.  Call our office if you have any questions about your drains. ACTIVITIES:  You may resume regular (light) daily activities beginning the next day--such as daily self-care, walking, climbing stairs--gradually increasing activities as tolerated.  You may have sexual intercourse when it is comfortable.  Refrain from any heavy lifting or straining until approved by your doctor. You may drive when you are no longer taking prescription pain medication, you can comfortably wear a seatbelt, and you can safely maneuver your car and apply brakes. RETURN TO WORK:  __________________________________________________________ Dennis Bast should see your doctor in the office for a follow-up appointment approximately 3-5 days after your surgery.  Your doctor's nurse will typically make your follow-up appointment when she calls you with your pathology report.  Expect your pathology report 2-3 business days after your surgery.  You may call to check if you do not hear from [place] after three days.   OTHER INSTRUCTIONS: ______________________________________________________________________________________________ ____________________________________________________________________________________________ WHEN TO CALL YOUR DOCTOR: Fever over 101.0 Nausea and/or vomiting Extreme swelling or bruising Continued bleeding from incision. Increased pain, redness, or drainage from the  incision. The clinic staff is available to answer your questions during regular business hours.  Please don't hesitate to call and ask to speak to  one of the nurses for clinical concerns.  If you have a medical emergency, go to the nearest emergency room or call 911.  A surgeon from [facility] is always on call at the hospital. [address] [place], [place], [place]  [address] ? [address], [place],    [address] [phone] ? [phone] ? FAX [phone] Web site: [place] will likely have some questions about what to expect following your operation.  The following information will help you and your family understand what to expect when you are discharged from the hospital.  Following these guidelines will help ensure a smooth recovery and reduce risks of complications.  Postoperative instructions include information on: diet, wound care, medications and physical activity.  AFTER SURGERY Expect to go home after the procedure.  In some cases, you may need to spend one night in the hospital for observation.  DIET Breast surgery does not require a specific diet.  However, the healthier you eat the better your body can start healing. It is important to increasing your protein intake.  This means limiting the foods with sugar and carbohydrates.  Focus on vegetables and some meat.  If you have any liposuction during your procedure be sure to drink water.  If your urine is bright yellow, then it is concentrated, and you need to drink more water.  As a general rule after surgery, you should have 8 ounces of water every hour while awake.  If you find you are persistently nauseated or unable to take in liquids let us know.  NO TOBACCO USE or EXPOSURE.  This will slow your healing process and increase the risk of a wound.  WOUND CARE Leave the binder on for 3 days . Use fragrance free soap.   After 3 days you can remove the binder to shower. Once dry apply binder or sports bra.  Use a mild soap like Dial, Dove and [place]. You may have Topifoam or Lipofoam on.  It is  soft and spongy and helps keep you from getting creases if you have liposuction.  This can be removed before the shower and then replaced.  If you need more it is available on [employer] (Lipofoam). If you have steri-strips / tape directly attached to your skin leave them in place. It is OK to get these wet.   No baths, pools or hot tubs for four weeks. We close your incision to leave the smallest and best-looking scar. No ointment or creams on your incisions until given the go ahead.  Especially not Neosporin (Too many skin reactions with this one).  A few weeks after surgery you can use Mederma and start massaging the scar. We ask you to wear your binder or sports bra for the first 6 weeks around the clock, including while sleeping. This provides added comfort and helps reduce the fluid accumulation at the surgery site.  ACTIVITY No heavy lifting until cleared by the doctor.  This usually means no more than a half-gallon of milk.  It is OK to walk and climb stairs. In fact, moving your legs is very important to decrease your risk of a blood clot.  It will also help keep you from getting deconditioned.  Every 1 to 2 hours get up and  walk for 5 minutes. This will help with a quicker recovery back to normal.  Let pain be your guide so you don't do too much.  This is not the time for spring cleaning and don't plan on taking care of anyone else.  This time is for you to recover,  You will be more comfortable if you sleep and rest with your head elevated either with a few pillows under you or in a recliner.  No stomach sleeping for a three months.  WORK Everyone returns to work at different times. As a rough guide, most people take at least 1 - 2 weeks off prior to returning to work. If you need documentation for your job, bring the forms to your postoperative follow up visit.  DRIVING Arrange for someone to bring you home from the hospital.  You may be able to drive a few days after surgery but not while  taking any narcotics or valium.  BOWEL MOVEMENTS Constipation can occur after anesthesia and while taking pain medication.  It is important to stay ahead for your comfort.  We recommend taking Milk of Magnesia (2 tablespoons; twice a day) while taking the pain pills.  MEDICATIONS You may be prescribed should start after surgery At your preoperative visit for you history and physical you may have been given the following medications: An antibiotic: Start this medication when you get home and take according to the instructions on the bottle. Zofran 4 mg:  This is to treat nausea and vomiting.  You can take this every 6 hours as needed and only if needed. Valium 2 mg: This is for muscle tightness if you have an implant or expander. This will help relax your muscle which also helps with pain control.  This can be taken every 12 hours as needed. Don't drive after taking this medication. Norco (hydrocodone/acetaminophen) 5/325 mg:  This is only to be used after you have taken the motrin or the tylenol. Every 8 hours as needed.   Over the counter Medication to take: Ibuprofen (Motrin) 600 mg:  Take this every 6 hours.  If you have additional pain then take 500 mg of the tylenol every 8 hours.  Only take the Norco after you have tried these two. Miralax or stool softener of choice: Take this according to the bottle if you take the [place] Call your surgeon's office if any of the following occur: Fever 101 degrees F or greater Excessive bleeding or fluid from the incision site. Pain that increases over time without aid from the medications Redness, warmth, or pus draining from incision sites Persistent nausea or inability to take in liquids Severe misshapen area that underwent the operation.  Here are some resources:  Plastic surgery website: https://www.plasticsurgery.org/for-medical-professionals/education-and-resources/publications/breast-reconstruction-magazine Breast  Reconstruction Awareness Campaign:  [employer]nz Plastic surgery Implant information:  https://www.plasticsurgery.org/patient-safety/breast-implant-safety  About my Jackson-Pratt Bulb Drain  What is a Jackson-Pratt bulb? A Jackson-Pratt is a soft, round device used to collect drainage. It is connected to a long, thin drainage catheter, which is held in place by one or two small stiches near your surgical incision site. When the bulb is squeezed, it forms a vacuum, forcing the drainage to empty into the bulb.  Emptying the Jackson-Pratt bulb- To empty the bulb: 1. Release the plug on the top of the bulb. 2. Pour the bulb's contents into a measuring container which your nurse will provide. 3. Record the time emptied and amount of drainage. Empty the drain(s) as often as  your     doctor or nurse recommends.  Date                  Time                    Amount (Drain left)                 Amount (Drain right)  _____________________________________________________________________  _____________________________________________________________________  _____________________________________________________________________  _____________________________________________________________________  _____________________________________________________________________  _____________________________________________________________________  _____________________________________________________________________  _____________________________________________________________________  Squeezing the Jackson-Pratt Bulb- To squeeze the bulb: 1. Make sure the plug at the top of the bulb is open. 2. Squeeze the bulb tightly in your fist. You will hear air squeezing from the bulb. 3. Replace the plug while the bulb is squeezed. 4. Use a safety pin to attach the bulb to your clothing. This will keep the catheter from     pulling at the bulb insertion site.  When to call your  doctor- Call your doctor if: Drain site becomes red, swollen or hot. You have a fever greater than 101 degrees F. There is oozing at the drain site. Drain falls out (apply a guaze bandage over the drain hole and secure it with tape). Drainage increases daily not related to activity patterns. (You will usually have more drainage when you are active than when you are resting.) Drainage has a bad odor.

## 2021-10-30 ENCOUNTER — Encounter (HOSPITAL_BASED_OUTPATIENT_CLINIC_OR_DEPARTMENT_OTHER): Payer: Self-pay | Admitting: Surgery

## 2021-10-30 ENCOUNTER — Telehealth: Payer: Self-pay | Admitting: *Deleted

## 2021-10-30 ENCOUNTER — Other Ambulatory Visit: Payer: Self-pay

## 2021-10-30 DIAGNOSIS — C50512 Malignant neoplasm of lower-outer quadrant of left female breast: Secondary | ICD-10-CM | POA: Diagnosis not present

## 2021-10-30 NOTE — Telephone Encounter (Signed)
Via spanish interpreter Almyra Free, pt given appt time to see Dr. Lindi Adie on 10/10 at 2:45pm

## 2021-10-30 NOTE — Anesthesia Postprocedure Evaluation (Signed)
Anesthesia Post Note  Patient: Julie Townsend  Procedure(s) Performed: LEFT MASTECTOMY WITH LEFT SENTINEL LYMPH NODE BIOPSY (Left: Breast) RIGHT TOTAL MASTECTOMY (Right: Breast) INSERTION PORT-A-CATH (Right: Chest) BREAST RECONSTRUCTION WITH PLACEMENT OF TISSUE EXPANDER AND FLEX HD (ACELLULAR HYDRATED DERMIS) (Bilateral: Breast)     Patient location during evaluation: PACU Anesthesia Type: General and Regional Level of consciousness: awake Pain management: pain level controlled Vital Signs Assessment: post-procedure vital signs reviewed and stable Respiratory status: spontaneous breathing, nonlabored ventilation, respiratory function stable and patient connected to nasal cannula oxygen Cardiovascular status: blood pressure returned to baseline and stable Postop Assessment: no apparent nausea or vomiting Anesthetic complications: no   No notable events documented.  Last Vitals:  Vitals:   10/30/21 0400 10/30/21 0500  BP:    Pulse: 71 68  Resp:    Temp:    SpO2: 97% 97%    Last Pain:  Vitals:   10/30/21 0500  TempSrc:   PainSc: Asleep                 Sharissa Brierley P Kindall Swaby

## 2021-10-30 NOTE — Discharge Summary (Signed)
Physician Discharge Summary  Patient ID: Julie Townsend MRN: [id number] DOB/AGE: [birth year] 56 y.o.  Admit date: 10/29/2021 Discharge date: 10/30/2021  Admission Diagnoses:  Discharge Diagnoses:  Principal Problem:   Breast cancer [facility])   Discharged Condition: Patient is doing well today.  Resting comfortably in bed.  Eating and drinking without difficulty.  Pain is currently 7 out of 10, last pain medication was last night 11 PM.  Ambulatory.  Voiding.  Husband at bedside.  Drains have put out approximately 60-100 cc per bulb.  Hospital Course: Admitted for observation after bilateral mastectomy and immediate reconstruction using tissue expander Flex HD.  Consults: None  Significant Diagnostic Studies: None  Treatments: surgery: Bilateral total mastectomy with immediate reconstruction using tissue expander and Flex HD.  Discharge Exam: Blood pressure (!) 85/61, pulse 76, temperature 98.4 F (36.9 C), resp. rate 16, height '5\' 2"'$  (1.575 m), weight 50.8 kg, SpO2 99 %. General appearance: alert, cooperative, and no distress Chest wall: Expanders in place.  Slight ecchymoses over left chest wall, but no significant swelling or obvious subcutaneous fluid collection noted.  No seroma or hematoma appreciated.  JP drains are intact and functional.  Normal-appearing drainage in bulbs bilaterally.  Honeycomb dressings in place, no incisional drainage appreciated. Extremities: No lower extremity swelling or edema.  Disposition: Discharge disposition: 01-Home or Self Care       Discharge Instructions     Diet - low sodium heart healthy   Complete by: As directed    Increase activity slowly   Complete by: As directed       Allergies as of 10/30/2021   No Known Allergies      Medication List     TAKE these medications    diazepam 2 MG tablet Commonly known as: Valium Take 1 tablet (2 mg total) by mouth every 12 (twelve) hours as needed for up to 20 doses for muscle  spasms.   Emgality 120 MG/ML Soaj Generic drug: Galcanezumab-gnlm Inject 120 mg into the skin every 28 (twenty-eight) days.   ondansetron 4 MG tablet Commonly known as: Zofran Take 1 tablet (4 mg total) by mouth every 8 (eight) hours as needed for up to 20 doses for nausea or vomiting.   oxyCODONE 5 MG immediate release tablet Commonly known as: Roxicodone Take 1 tablet (5 mg total) by mouth every 8 (eight) hours as needed for up to 20 doses for severe pain.   SUMAtriptan 50 MG tablet Commonly known as: IMITREX Take 1 tablet (50 mg total) by mouth as needed for migraine (May repeat after 2 hours.  Maximum 2 tablets in 24 hours). May repeat in 2 hours if headache persists or recurs.   Ubrelvy 100 MG Tabs Generic drug: Ubrogepant TAKE AS NEEDED (TAKE 1 AT THE EARLIST ONSET OF A MIGRAINE. MAY REPEAT IN 2 HOURS. MAX 2/24 HOURS).        Follow-up Information     [name], MD. Schedule an appointment as soon as possible for a visit in 3 week(s).   Specialty: General Surgery Contact information: [place] [id number] [facility] [address] [place], [place] [address] [phone]  Signed: Krista Blue 10/30/2021, 8:32 AM

## 2021-11-03 LAB — SURGICAL PATHOLOGY

## 2021-11-04 ENCOUNTER — Encounter: Payer: Self-pay | Admitting: *Deleted

## 2021-11-05 ENCOUNTER — Ambulatory Visit (INDEPENDENT_AMBULATORY_CARE_PROVIDER_SITE_OTHER): Payer: Commercial Managed Care - HMO | Admitting: Physician Assistant

## 2021-11-05 ENCOUNTER — Encounter: Payer: Self-pay | Admitting: Physician Assistant

## 2021-11-05 DIAGNOSIS — Z9889 Other specified postprocedural states: Secondary | ICD-10-CM

## 2021-11-05 NOTE — Progress Notes (Signed)
Patient is a pleasant 55 year old female with PMH of left-sided breast cancer s/p bilateral mastectomy with immediate reconstruction using tissue expander and Flex HD performed 10/29/2021 Dr. Marla Roe presents to clinic for postoperative follow-up.  Reviewed operative report and 100 cc was placed into each of the 455 cc expanders.   Today, patient is doing well.  She is accompanied by her husband and daughter at bedside.  She states that her pain symptoms have already improved considerably since time of discharge.  She does however continue to experience daily neuropathy and drain tube discomfort.  She also states that she has chest "heaviness" diffusely, but denies any shortness of breath symptoms.  Reviewed her log of volume output, approximately 30 cc/day from each side.  She denies any fevers, leg swelling, difficulty breathing, redness or swelling, or other symptoms.  Physical exam is entirely reassuring.  Expanders appropriately placed.  Residual ecchymoses that are resolving.  Honeycomb dressings firmly intact.  JP drains intact and functional, normal-appearing drainage in bulbs bilaterally.  No swelling or evidence concerning for seroma or hematoma.  We will hold off on drain removal until she is 14 days postop given use of ADM. will likely remove dressings and take postoperative photos at that encounter, as well.  Discussed return precautions.

## 2021-11-06 NOTE — Progress Notes (Signed)
Patient Care Team: Pcp, No as PCP - General [name], RN as Oncology Nurse Navigator Mauro Kaufmann, RN as Oncology Nurse Navigator [name], MD as Consulting Physician (General Surgery) Nicholas Lose, MD as Consulting Physician (Hematology and Oncology) Kyung Rudd, MD as Consulting Physician (Radiation Oncology)  DIAGNOSIS:  Encounter Diagnosis  Name Primary?   Malignant neoplasm of lower-outer quadrant of left breast of female, estrogen receptor negative ([place])     SUMMARY OF ONCOLOGIC HISTORY: Oncology History  Malignant neoplasm of lower-outer quadrant of left breast of female, estrogen receptor negative ([place])  09/07/2021 Initial Diagnosis   Screening mammogram detected left breast asymmetry posteriorly.  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, Ki-67 70%   09/14/2021 Cancer Staging   Staging form: Breast, AJCC 8th Edition - Clinical stage from 09/14/2021: Stage IB (cT1c, cN0, cM0, G3, ER-, PR-, HER2-) - Signed by Hayden Pedro, PA-C on 09/14/2021 Stage prefix: Initial diagnosis Method of lymph node assessment: Clinical Histologic grading system: 3 grade system   09/26/2021 Genetic Testing   Pathogenic variant in BRCA2 at p.T6226* (c.3922G>T).  Report date is September 26, 2021.    The BRCAplus panel offered by [employer] and includes sequencing and deletion/duplication analysis for the following 8 genes: ATM, BRCA1, BRCA2, CDH1, CHEK2, PALB2, PTEN, and TP53.  Results of pan-cancer panel are pending.    10/29/2021 Surgery   Bilateral mastectomies Right mastectomy: Benign, PASH Left mastectomy: Grade 3 IDC with DCIS 1.2 cm, margins negative, 0/5 sentinel lymph nodes negative, ER 0%, PR 0%, HER2 negative, Ki-67 70%     CHIEF COMPLIANT: Follow-up to discuss results  INTERVAL HISTORY: Julie Townsend is a 56 y.o. female with the above-mentioned left breast  cancer. She presents to the clinic for a follow-up. She states that surgery went well.  She is not experiencing too much pain or discomfort.  She still has drains in place and she expects them to come out later this week.   ALLERGIES:  has No Known Allergies.  MEDICATIONS:  Current Outpatient Medications  Medication Sig Dispense Refill   diazepam (VALIUM) 2 MG tablet Take 1 tablet (2 mg total) by mouth every 12 (twelve) hours as needed for up to 20 doses for muscle spasms. 20 tablet 0   Galcanezumab-gnlm (EMGALITY) 120 MG/ML SOAJ Inject 120 mg into the skin every 28 (twenty-eight) days. 3 mL 1   ondansetron (ZOFRAN) 4 MG tablet Take 1 tablet (4 mg total) by mouth every 8 (eight) hours as needed for up to 20 doses for nausea or vomiting. 20 tablet 0   oxyCODONE (ROXICODONE) 5 MG immediate release tablet Take 1 tablet (5 mg total) by mouth every 8 (eight) hours as needed for up to 20 doses for severe pain. 20 tablet 0   SUMAtriptan (IMITREX) 50 MG tablet Take 1 tablet (50 mg total) by mouth as needed for migraine (May repeat after 2 hours.  Maximum 2 tablets in 24 hours). May repeat in 2 hours if headache persists or recurs. 30 tablet 1   Ubrogepant (UBRELVY) 100 MG TABS TAKE AS NEEDED (TAKE 1 AT THE EARLIST ONSET OF A MIGRAINE. MAY REPEAT IN 2 HOURS. MAX 2/24 HOURS). 30 tablet 1   No current facility-administered medications for this visit.    PHYSICAL EXAMINATION: ECOG PERFORMANCE STATUS: 1 - Symptomatic but completely ambulatory  Vitals:   11/10/21 1458  BP: 123/86  Pulse:  87  Resp: 18  Temp: 97.7 F (36.5 C)  SpO2: 98%   Filed Weights   11/10/21 1458  Weight: 115 lb (52.2 kg)      LABORATORY DATA:  I have reviewed the data as listed    Latest Ref Rng & Units 09/16/2021   12:26 PM  CMP  Glucose 70 - 99 mg/dL 91   BUN 6 - 20 mg/dL 9   Creatinine 0.44 - 1.00 mg/dL 0.83   Sodium 135 - 145 mmol/L 140   Potassium 3.5 - 5.1 mmol/L 4.1   Chloride 98 - 111 mmol/L 106   CO2 22  - 32 mmol/L 29   Calcium 8.9 - 10.3 mg/dL 9.3   Total Protein 6.5 - 8.1 g/dL 7.1   Total Bilirubin 0.3 - 1.2 mg/dL 0.2   Alkaline Phos 38 - 126 U/L 65   AST 15 - 41 U/L 43   ALT 0 - 44 U/L 37     Lab Results  Component Value Date   WBC 6.4 09/16/2021   HGB 13.8 09/16/2021   HCT 40.1 09/16/2021   MCV 91.3 09/16/2021   PLT 192 09/16/2021   NEUTROABS 1.4 (L) 09/16/2021    ASSESSMENT & PLAN:  Malignant neoplasm of lower-outer quadrant of left breast of female, estrogen receptor negative ([place]) 09/07/2021:Screening mammogram detected left breast asymmetry posteriorly.  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, margins negative, 0/5 sentinel lymph nodes negative, ER 0%, PR 0%, HER2 negative, Ki-67 70%   Treatment plan: Adjuvant chemotherapy with Adriamycin and Cytoxan followed by Taxol No role of adjuvant radiation or antiestrogen therapy  Return to clinic in 3 weeks to start chemotherapy Recommended participation in the nausea clinical trial    No orders of the defined types were placed in this encounter.  The patient has a good understanding of the overall plan. she agrees with it. she will call with any problems that may develop before the next visit here. Total time spent: 30 mins including face to face time and time spent for planning, charting and co-ordination of care   Harriette Ohara, MD 11/10/21    I Gardiner Coins am scribing for Dr. Lindi Adie  I have reviewed the above documentation for accuracy and completeness, and I agree with the above.

## 2021-11-10 ENCOUNTER — Other Ambulatory Visit: Payer: Self-pay

## 2021-11-10 ENCOUNTER — Encounter: Payer: Self-pay | Admitting: *Deleted

## 2021-11-10 ENCOUNTER — Inpatient Hospital Stay: Payer: Commercial Managed Care - HMO | Attending: Hematology and Oncology | Admitting: Hematology and Oncology

## 2021-11-10 VITALS — BP 123/86 | HR 87 | Temp 97.7°F | Resp 18 | Ht 62.0 in | Wt 115.0 lb

## 2021-11-10 DIAGNOSIS — Z9013 Acquired absence of bilateral breasts and nipples: Secondary | ICD-10-CM | POA: Diagnosis not present

## 2021-11-10 DIAGNOSIS — Z79899 Other long term (current) drug therapy: Secondary | ICD-10-CM | POA: Insufficient documentation

## 2021-11-10 DIAGNOSIS — C50512 Malignant neoplasm of lower-outer quadrant of left female breast: Secondary | ICD-10-CM | POA: Diagnosis present

## 2021-11-10 DIAGNOSIS — Z171 Estrogen receptor negative status [ER-]: Secondary | ICD-10-CM

## 2021-11-10 DIAGNOSIS — Z9221 Personal history of antineoplastic chemotherapy: Secondary | ICD-10-CM | POA: Diagnosis not present

## 2021-11-10 MED ORDER — LIDOCAINE-PRILOCAINE 2.5-2.5 % EX CREA: TOPICAL_CREAM | CUTANEOUS | 3 refills | Status: DC

## 2021-11-10 MED ORDER — ONDANSETRON HCL 8 MG PO TABS: ORAL_TABLET | ORAL | 1 refills | Status: DC

## 2021-11-10 MED ORDER — DEXAMETHASONE 4 MG PO TABS: 4.0000 mg | ORAL_TABLET | Freq: Every day | ORAL | 0 refills | Status: AC

## 2021-11-10 MED ORDER — PROCHLORPERAZINE MALEATE 10 MG PO TABS: 10.0000 mg | ORAL_TABLET | Freq: Four times a day (QID) | ORAL | 1 refills | Status: DC | PRN

## 2021-11-10 NOTE — Progress Notes (Deleted)
DISCONTINUE ON PATHWAY REGIMEN - Breast     Cycles 1 through 4: A cycle is every 14 days:     Doxorubicin      Cyclophosphamide      Pegfilgrastim-xxxx    Cycles 5 through 16: A cycle is every 7 days:     Paclitaxel   **Always confirm dose/schedule in your pharmacy ordering system**  REASON: Other Reason PRIOR TREATMENT: [id number]: Dose-Dense AC-T (Paclitaxel Weekly) - [Doxorubicin + Cyclophosphamide q14 Days x 4 Cycles, Followed by Paclitaxel 80 mg/m2 Weekly x 12 Weeks]  START ON PATHWAY REGIMEN - Breast     Cycles 1 through 4: A cycle is every 14 days:     Doxorubicin      Cyclophosphamide      Pegfilgrastim-xxxx    Cycles 5 through 16: A cycle is every 7 days:     Paclitaxel   **Always confirm dose/schedule in your pharmacy ordering system**  Patient Characteristics: Postoperative without Neoadjuvant Therapy (Pathologic Staging), Invasive Disease, Adjuvant Therapy, HER2 Negative, ER Negative, Node Negative, pT1a-c, N12mi or pT1c or Higher, pN0 Therapeutic Status: Postoperative without Neoadjuvant Therapy (Pathologic Staging) AJCC Grade: G3 AJCC N Category: pN0 AJCC M Category: cM0 ER Status: Negative (-) AJCC 8 Stage Grouping: IB HER2 Status: Negative (-) Oncotype Dx Recurrence Score: Not Appropriate AJCC T Category: pT1c PR Status: Negative (-) Intent of Therapy: Curative Intent, Discussed with Patient

## 2021-11-10 NOTE — Progress Notes (Signed)
START ON PATHWAY REGIMEN - Breast     Cycles 1 through 4: A cycle is every 14 days:     Doxorubicin      Cyclophosphamide      Pegfilgrastim-xxxx    Cycles 5 through 16: A cycle is every 7 days:     Paclitaxel   **Always confirm dose/schedule in your pharmacy ordering system**  Patient Characteristics: Postoperative without Neoadjuvant Therapy (Pathologic Staging), Invasive Disease, Adjuvant Therapy, HER2 Negative, ER Negative, Node Negative, pT1a-c, N45mi or pT1c or Higher, pN0 Therapeutic Status: Postoperative without Neoadjuvant Therapy (Pathologic Staging) AJCC Grade: G3 AJCC N Category: pN0 AJCC M Category: cM0 ER Status: Negative (-) AJCC 8 Stage Grouping: IB HER2 Status: Negative (-) Oncotype Dx Recurrence Score: Not Appropriate AJCC T Category: pT1c PR Status: Negative (-) Intent of Therapy: Curative Intent, Discussed with Patient

## 2021-11-10 NOTE — Assessment & Plan Note (Addendum)
09/07/2021:Screening mammogram detected left breast asymmetry posteriorly.  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, margins negative, 0/5 sentinel lymph nodes negative, ER 0%, PR 0%, HER2 negative, Ki-67 70%  Treatment plan: Adjuvant chemotherapy with Adriamycin and Cytoxan followed by Taxol No role of adjuvant radiation or antiestrogen therapy  Return to clinic in 3 weeks to start chemotherapy Recommended participation in the nausea clinical trial

## 2021-11-11 ENCOUNTER — Telehealth: Payer: Self-pay

## 2021-11-11 ENCOUNTER — Other Ambulatory Visit: Payer: Self-pay

## 2021-11-11 NOTE — Telephone Encounter (Signed)
[id number] - TREATMENT OF REFRACTORY NAUSEA  Rec'd referral from Dr Lindi Adie for the above study. I spoke with Mrs Julie Townsend via phone to introduce consents and email them to her at [email or web address]. Plan made for follow up via phone on Friday 11/13/21. Per Dr Lindi Adie, plan is to start chemo on 12/03/21, so patient will need to come in before then to sign consents and pick up study questionnaires.  Patient Julie Townsend was identified by Dr Lindi Adie as a potential candidate for the above listed study.  This Clinical Research Nurse spoke with [name], [phone], today via phone.  A copy of the informed consent document and separate HIPAA Authorization was emailed to the patient.  Patient reads, speaks, and understands [place].   Patient was provided with the contact information of this Nurse and encouraged to contact the research team with any questions.  Approximately 10 minutes were spent with the patient reviewing the informed consent documents.  Patient was provided the option of taking informed consent documents home to review and was encouraged to review at their convenience with their support network, including other care providers.   Marjie Skiff Anders Hohmann, RN, BSN, [facility] She  Her  Hers Clinical Research Nurse [facility] Direct Dial [phone]  Pager [phone] 11/11/2021 4:29 PM

## 2021-11-12 ENCOUNTER — Encounter: Payer: Self-pay | Admitting: *Deleted

## 2021-11-12 NOTE — Progress Notes (Signed)
Patient is a pleasant 56 year old female with PMH of left-sided breast cancer s/p bilateral mastectomy with immediate reconstruction using tissue expander and Flex HD performed 10/29/2021 Dr. Marla Roe presents to clinic for postoperative follow-up.  She was last seen here in the office on 11/05/2021.  At that time, exam was reassuring.  Expanders appropriately placed.  Normal-appearing JP drains had little output, but plan was for them to remain until she is 14 days postop given use of ADM.  We will likely remove dressings and take postoperative photos at subsequent encounter.  100 cc was placed into each of the 455 cc expanders at time of expander placement.  Today, patient is doing well.  She states that she continues to experience muscle spasms that are unimproved with the Valium.  She also does not like to take the narcotics that been prescribed as she did not feel as though they provide any benefit.  Patient is inquiring about an alternative medication.  She denies any breast redness, severe pain, fevers, leg swelling, or other symptoms.  Her drainage output has been less than 15 cc/day from each side.  Physical exam entirely reassuring.  Expanders in place.  No erythema or subcutaneous fluid collection suggestive of seroma or hematoma.  No ecchymoses.  Honeycomb dressings are removed without complication or difficulty.  Steri-Strips remain firmly intact.  JP drains are intact and functional, normal-appearing drainage in bulbs bilaterally.  Drains removed without complication or difficulty.  She tells me that her oncologist, Dr. Lindi Adie, would like to initiate chemotherapy next month and advised that we hold off on implant exchange until after completion of chemotherapy.  Given her ongoing discomfort and plans for delayed implant exchange, will hold off on expander fill today instead try again next week.  We will try to mediate her discomfort with Flexeril.  Advised not to combine with benzodiazepines  or narcotics given sedative effects.  Return for expander fill next week, she can certainly call clinic should she have any questions or concerns in interim.

## 2021-11-13 ENCOUNTER — Telehealth: Payer: Self-pay

## 2021-11-13 ENCOUNTER — Ambulatory Visit (INDEPENDENT_AMBULATORY_CARE_PROVIDER_SITE_OTHER): Payer: Commercial Managed Care - HMO | Admitting: Physician Assistant

## 2021-11-13 DIAGNOSIS — Z9889 Other specified postprocedural states: Secondary | ICD-10-CM

## 2021-11-13 NOTE — Telephone Encounter (Signed)
[id number] - TREATMENT OF REFRACTORY NAUSEA  Called Mrs Corena Pilgrim to follow up on study interest. No answer; left voicemail with my contact information.  Marjie Skiff Andrick Rust, RN, BSN, [facility] She  Her  Hers Clinical Research Nurse [facility] Direct Dial [phone]  Pager [phone] 11/13/2021 2:28 PM

## 2021-11-16 ENCOUNTER — Telehealth: Payer: Self-pay | Admitting: Physician Assistant

## 2021-11-16 MED ORDER — CYCLOBENZAPRINE HCL 10 MG PO TABS: 10.0000 mg | ORAL_TABLET | Freq: Two times a day (BID) | ORAL | 0 refills | Status: AC | PRN

## 2021-11-16 NOTE — Addendum Note (Signed)
Addended by: Krista Blue on: 11/16/2021 10:37 AM   Modules accepted: Orders

## 2021-11-16 NOTE — Telephone Encounter (Signed)
Pt called stated that she was supposed to have a med fill after her visit on 11-13-21. Pt would like a call back please

## 2021-11-17 ENCOUNTER — Encounter: Payer: Self-pay | Admitting: Hematology and Oncology

## 2021-11-17 NOTE — Telephone Encounter (Signed)
All resolved, just verified with patient

## 2021-11-17 NOTE — Progress Notes (Signed)
GENETIC TEST RESULTS  Patient Name: Julie Townsend Patient Age: 56 y.o. Encounter Date: 09/26/2021  Referring Provider: Nicholas Lose, MD   Julie Townsend was seen in the [place] clinic on September 16, 2021 due to a personal history of breast cancer, a family history of BRCA-related cancers, and concern regarding a hereditary predisposition to cancer in the family. Please refer to the prior Genetics clinic note for more information regarding Julie Townsend's medical and family histories and our assessment at the time.   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 Oncology History  Malignant neoplasm of lower-outer quadrant of left breast of female, estrogen receptor negative (Julie Townsend)  09/07/2021 Initial Diagnosis   Screening mammogram detected left breast asymmetry posteriorly.  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  Report date is September 26, 2021.    The BRCAplus panel offered by [employer] and includes sequencing and deletion/duplication analysis for the following 8 genes: ATM, BRCA1, BRCA2, CDH1, CHEK2, PALB2, PTEN, and TP53.  Results of pan-cancer panel are pending.      FAMILY HISTORY:  We obtained a detailed, 4-generation family history.  Significant diagnoses are listed below:      Family History  Problem Relation Age of Onset   Breast cancer Mother 19         recurr at  68   Colon cancer Maternal Uncle          dx late 60s   Pancreatic cancer Maternal Uncle 28   Ovarian cancer Maternal Grandmother          dx 38s     Julie Townsend had limited information about her paternal family history. Julie Townsend is unaware of previous family history of genetic testing for hereditary cancer risks. There is no reported Ashkenazi Jewish ancestry. There is no known consanguinity.   GENETIC TESTING:  Julie Townsend tested positive for a single pathogenic variant in the BRCA2 gene. Specifically, this variant is BRCA2  p.Q6834* (c.3922G>T).  No other pathogenic variants detected in [facility] CancerNextExpanded +RNAinsight Panel.  The CancerNext-Expanded gene panel offered by [facility] and includes sequencing, rearrangement, and RNA analysis for the following 77 genes: AIP, ALK, APC, ATM, AXIN2, BAP1, BARD1, BLM, BMPR1A, BRCA1, BRCA2, BRIP1, CDC73, CDH1, CDK4, CDKN1B, CDKN2A, CHEK2, CTNNA1, DICER1, FANCC, FH, FLCN, GALNT12, KIF1B, LZTR1, MAX, MEN1, MET, MLH1, MSH2, MSH3, MSH6, MUTYH, NBN, NF1, NF2, NTHL1, PALB2, PHOX2B, PMS2, POT1, PRKAR1A, PTCH1, PTEN, RAD51C, RAD51D, RB1, RECQL, RET, SDHA, SDHAF2, SDHB, SDHC, SDHD, SMAD4, SMARCA4, SMARCB1, SMARCE1, STK11, SUFU, TMEM127, TP53, TSC1, TSC2, VHL and XRCC2 (sequencing and deletion/duplication); EGFR, EGLN1, HOXB13, KIT, MITF, PDGFRA, POLD1, and POLE (sequencing only); EPCAM and GREM1 (deletion/duplication only).    The test report has been scanned into EPIC and is located under the Molecular Pathology section of the Results Review tab.  A portion of the result report is included below for reference. Genetic testing reported out on  September 29, 2021.      Per [employer], risks associated with this specific pathogenic variant in BRCA2 are not well defined and may be variable, ranging from negligible to high-risk hereditary breast and ovarian cancer syndrome.  Clinical correlation is advised.  She  has a personal history of triple negative breast cancer and an extensive maternal family history of BRCA-related cancers; thus, we discussed that it is clinically appropriate to follow HBOC management guidelines.   Clinical Information: Hereditary breast and ovarian cancer (HBOC) syndrome is characterized by an increased lifetime risk for, generally, adult-onset cancers including, breast, contralateral breast, female breast, ovarian, prostate, melanoma and pancreatic.  The cancers associated with BRCA2 are: Female breast cancer, up to an 84% risk In women with a history of breast cancer, the risk for contralateral breast cancer 10 years after breast cancer diagnosis is 10-30%.  Female breast cancer, up to an 8% risk Ovarian cancer, 13-29% risk Pancreatic cancer, 5-10% risk Prostate cancer, 19-61% risk Melanoma, elevated risk   Management Recommendations:  Breast Screening/Risk Reduction:  Women: Breast awareness starting at age 75 Clinical breast examination every 6-12 months starting at age 32  Breast cancer screening: Age 38-29 years, annual breast MRI with and without contrast (or mammogram, if MRI is unavailable), although the age to initiate screening may be individualized based on family history Age 89-75 years, annual mammogram and breast MRI with and without contrast Age >75 years, management should be considered on an individual basis For women with a BRCA2 pathogenic or likely pathogenic variant who are treated for breast cancer and have not had a bilateral mastectomy, screening with annual mammogram and breast MRI should continue as described above. The option of prophylactic bilateral risk-reducing mastectomy (RRM), removal of the breast tissue before cancer develops, is the best option for significantly decreasing the risk of developing breast cancer. Studies have shown mastectomies reduce the risk of breast cancer by 90-95% in women with a BRCA2 mutation.   Males: Breast  self-exam training and education starting at age 2 years Annual clinical breast exam starting at age 92 years  Consider annual mammogram starting at age 62 or 33 years before the earliest known female breast cancer in the family (whichever comes first).   Gynecological Cancer Screening/Risk Reduction: It is recommended that women with a BRCA2 mutation have a risk-reducing salpingo oophorectomy (RRSO), removal of the ovaries and fallopian tubes. It is reasonable to delay RRSO until age 35-45 years unless age at diagnosis in the family warrants earlier age for consideration of RRSO.  Having a RRSO is estimated to reduce the risk of ovarian cancer by up to 96%. There is still a small risk of developing an "ovarian-like" cancer in the lining of the abdomen, called the peritoneum. Another benefit to having the ovaries removed is the risk reduction for breast cancer. If the ovaries are removed before menopause, the risk of developing breast cancer is reduced. Ovarian cancer screening is an option for women who chose not to have a RRSO or who have not yet completed their family. Current screening methods for ovarian cancer are neither sensitive nor specific, meaning that often early stage ovarian cancer cannot be diagnosed through this screening.  Screening can also be falsely positive with no cancer present. For this reason, RRSO is recommended over screening. If ovarian cancer screening is recommended by a physician, it could include: CA-125 blood tests Transvaginal ultrasounds Clinical pelvic exams   Skin Cancer Screening and Risk Reduction: Regular skin self-examinations Individuals should  notify their physicians of any changes to moles such as increasing in size, darkening in color, or other change in appearance. Annual skin examinations by a dermatologist  Follow sun-safety recommendations such as: Using UVA and UVB 30 SPF or higher sunscreen Avoiding sunburns Limiting sun exposure, especially  during the hours of 11am-4pm  Wearing protective clothing and sunglasses Avoid using tanning beds For more information about the prevention of melanoma visit [email or web address].[email or web address]   Prostate Cancer Screening: Annual digital rectal exam (DRE) at age 10 Annual PSA blood test at age 48  Pancreatic Cancer Screening/Risk Reduction: Avoid smoking, heavy alcohol use, and obesity. It has been suggested that pancreatic cancer screening be limited to those with a family history of pancreatic cancer (first- or second-degree relative). Ideally, screening should be performed in experienced centers utilizing a multidisciplinary approach under research conditions. Recommended screening could include annual endoscopic ultrasound (preferred) and/or MRI of the pancreas starting at age 80 or 34 years younger than the earliest age diagnosis in the family.  Additional Considerations: Individuals at risk for developing breast and ovarian cancer may benefit from the use of medication to reduce their risk for cancer. These medications are referred to as chemoprevention. For example, oral contraceptive use has been shown to reduce the risk of ovarian cancer by approximately 60% in BRCA2 mutation carriers if taken for at least 5 years. This risk reduction remains even after discontinuation of oral contraceptives. Recent studies have suggested PARP inhibitors may be a beneficial chemotherapeutic agent for a subset of patients with BRCA2-associated breast, ovarian, prostate, and pancreatic cancers. Clinical trials are currently in process to determine if and how these agents can be useful in the treatment of BRCA2 cancer patients Patients of reproductive age should be made aware of options for prenatal diagnosis and assisted reproduction including pre-implantation genetic diagnosis. Individuals with a single pathogenic BRCA2 variant are carriers of Fanconi anemia. Fanconi anemia is characterized by developmental delay apparent  from infancy, short stature, microcephaly, and coarse dysmorphic features. For there to be a risk of Fanconi anemia in offspring, both the patient and their partner would each have to carry a pathogenic variant in [place]. In this case, the risk of having an affected child is 25%.    This information is based on current understanding of the gene and may change in the future.   Implications for Family Members: Hereditary predisposition to cancer due to pathogenic variants in the BRCA2 gene has autosomal dominant inheritance. This means that an individual with a pathogenic variant has a 50% chance of passing the condition on to his/her offspring. Identification of a pathogenic variant allows for the recognition of at-risk relatives who can pursue testing for the familial variant.  Family members are encouraged to consider genetic testing for this familial pathogenic variant. As there are generally no childhood cancer risks associated with a single pathogenic variant in the BRCA2 gene, individuals in the family are not recommended to have testing until they reach at least 56 years of age. They may contact our office at [phone] for more information or to schedule an appointment. Complimentary testing for the familial variant is available for 90 days after the genetic testing report date.  Family members who live outside of the area are encouraged to find a genetic counselor in their area by visiting: PanelJobs.es.  Resources: FORCE (Facing Our Risk of Cancer Empowered) is a resource for those with a hereditary predisposition to develop cancer.  FORCE provides information about risk reduction, advocacy, legislation, and clinical  trials.  Additionally, FORCE provides a platform for collaboration and support; which includes: peer navigation, message boards, local support groups, a toll-free helpline, research registry and recruitment, advocate training, published medical  research, webinars, brochures, mastectomy photos, and more.  For more information, visit www.facingourrisk.org  Plan:  Given increased risk of contralateral breast cancer, Julie Townsend is interested in exploring bilateral mastectomies.  Oncology team notified.  At the appropriate, time, Julie Townsend should be referred to [facility] for discussion of RRSO, dermatology for appropriate skin checks, and Gastroenterology for pancreatic cancer screening.  Family letter provided to encourage family testing for BRCA2 mutation.

## 2021-11-19 ENCOUNTER — Encounter: Payer: Self-pay | Admitting: *Deleted

## 2021-11-19 NOTE — Progress Notes (Signed)
NEUROLOGY FOLLOW UP OFFICE NOTE  CARLOS QUACKENBUSH [id number]  Assessment/Plan:   Migraine without aura, without status migrainosus, not intractable Chronic daily headache   Migraine prevention:  Emgality every 28 days.  To address daily headaches, start nortriptyline 43m at bedtime.  We can increase to 231mat bedtime in 4 weeks if needed. Migraine rescue:  Will have her try Zavzpret NS for acute treatment of migraines; sumatriptan 5036mecond line Limit use of pain relievers to no more than 2 days out of week to prevent risk of rebound or medication-overuse headache. Keep headache diary Follow up 4-5 months.       Subjective:  OlgATTICUS LEMBERGER a 56 57ar old female who follows up for migraine.   UPDATE: Still has a very dull nagging headache Intensity:  5-6/10 Duration:  4-6 hours (however waiting to repeat Ubrelvy dose) Frequency:  3-4 days (2 before the Emgality injection and 2 days after injection)   Rescue protocol:  First line Ubrelvy, sumatriptan second line Frequency of abortive medication: 4 days a month Current NSAIDS/analgesics:  ibuprofen Current triptans:  sumatriptan 53m88mrrent ergotamine:  none Current anti-emetic:  none Current muscle relaxants:  none Current Antihypertensive medications:  none Current Antidepressant medications:  none Current Anticonvulsant medications:  none Current anti-CGRP:  Emgality, Ubrelvy 100mg33mrent Vitamins/Herbal/Supplements:  Mg Current Antihistamines/Decongestants:  none Other therapy:  ice packs, dark room, rest Hormone/birth control:  estradiol   Caffeine:  1 can Coke daily Alcohol:  No Smoker:  No Diet:  Herbal tea all day.  Eats a lot of fruits and vegetables.  Little protein.  No chocolate or cheese Exercise:  routine.  Home gym Depression:  yes but mild; Anxiety:  no Other pain:  osteoarthritis in hands Sleep hygiene:  Poor as she is going through menopause   HISTORY:  She has a dull  headache daily. As for migraines: Onset:  16 ye43s old Location:  over left eye or across forehead, maxillary pressure Quality:  stabbing Initial Intensity:  5-7/10 (rarely more severe).  She denies new headache, thunderclap headache or severe headache that wakes her from sleep. Aura:  absent Prodrome:  absent Associated symptoms:  Nausea, photophobia, phonophobia, irritable.  She denies associated vomiting, autonomic symptoms, visual disturbance, unilateral numbness or weakness. Initial Duration:  24-48 hours (if takes sumatriptan, will take a nap for 45 min to 3 hours and wakes up resolved)  Typically wakes up with it. Initial Frequency:  once a week from 1 to 2 days (3 days before the next Emgality shot she has a severe migraine - she had a lot more migraine days prior to EmgalTerex Corporationrequency of abortive medication: 1  day a week  Triggers:  change in barometric pressure Relieving factors:  rest, ice pack, heat, sleep Activity:  movement aggravates       Past NSAIDS/analgesics:  Sprix NS, Fioricet, Excedrin Migraine Past abortive triptans:  Axert, Maxalt, Relpax, Zomig Past abortive ergotamine:  none Past muscle relaxants:  none Past anti-emetic:  none Past antihypertensive medications:  verapamil Past antidepressant medications:  amitriptyline Past anticonvulsant medications:  Topiramate, Depakote Past anti-CGRP:  Aimovig, Nurtec (rescue) Past vitamins/Herbal/Supplements:  Mg, B2 Past antihistamines/decongestants:  none Other past therapies:  none     Family history of migraines:  daughter, mother, maternal grandmother, maternal great-grandmother, sister  PAST MEDICAL HISTORY: Past Medical History:  Diagnosis Date   BRCA2 gene mutation positive 09/26/2021   Breast cancer (HCC) [id number]   left breast IDC  Family history of breast cancer 09/22/2021   Family history of ovarian cancer 09/22/2021   Family history of pancreatic cancer 09/22/2021   Genetic testing 09/26/2021    Pathogenic variant in BRCA2 at p.N2778* (c.3922G>T).  Report date is September 26, 2021.    The BRCAplus panel offered by [employer] and includes sequencing and deletion/duplication analysis for the following 8 genes: ATM, BRCA1, BRCA2, CDH1, CHEK2, PALB2, PTEN, and TP53.  Results of pan-cancer panel are pending.     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 20 tablet 0   diazepam (VALIUM) 2 MG tablet Take 1 tablet (2 mg total) by mouth every 12 (twelve) hours as needed for up to 20 doses for muscle spasms. 20 tablet 0   Galcanezumab-gnlm (EMGALITY) 120 MG/ML SOAJ Inject 120 mg into the skin every 28 (twenty-eight) days. 3 mL 1   lidocaine-prilocaine (EMLA) cream Apply to affected area once 30 g 3   ondansetron (ZOFRAN) 4 MG tablet Take 1 tablet (4 mg total) by mouth every 8 (eight) hours as needed for up to 20 doses for nausea or vomiting. 20 tablet 0   ondansetron (ZOFRAN) 8 MG tablet Take 1 tab (8 mg) by mouth every 8 hrs as needed for nausea/vomiting. Start third day after doxorubicin/cyclophosphamide chemotherapy. 30 tablet 1   oxyCODONE (ROXICODONE) 5 MG immediate release tablet Take 1 tablet (5 mg total) by mouth every 8 (eight) hours as needed for up to 20 doses for severe pain. 20 tablet 0   prochlorperazine (COMPAZINE) 10 MG tablet Take 1 tablet (10 mg total) by mouth every 6 (six) hours as needed for nausea or vomiting. 30 tablet 1   SUMAtriptan (IMITREX) 50 MG tablet Take 1 tablet (50 mg total) by mouth as needed for migraine (May repeat after 2 hours.  Maximum 2 tablets in 24 hours). May repeat in 2 hours if headache persists or recurs. 30 tablet 1   Ubrogepant (UBRELVY) 100 MG TABS TAKE AS NEEDED  (TAKE 1 AT THE EARLIST ONSET OF A MIGRAINE. MAY REPEAT IN 2 HOURS. MAX 2/24 HOURS). 30 tablet 1   No current facility-administered medications on file prior to visit.    ALLERGIES: No Known Allergies  FAMILY HISTORY: Family History  Problem Relation Age of Onset   Migraines Mother    Breast cancer Mother 28       recurr at  29   Migraines Maternal Aunt    Colon cancer Maternal Uncle        dx late 60s   Pancreatic cancer Maternal Uncle 28   Migraines Maternal Grandmother    Ovarian cancer Maternal Grandmother        dx 62s      Objective:  Blood pressure (!) 134/90, pulse 96, height $RemoveBe'5\' 2"'GwTCTjfVO$  (1.575 m), weight 114 lb 9.6 oz (52 kg), SpO2 95 %. General: No acute distress.  Patient appears well-groomed.   Head:  Normocephalic/atraumatic Eyes:  Fundi examined but not visualized Neck: supple, no paraspinal tenderness, full range of motion Heart:  Regular rate and rhythm Neurological Exam: alert and oriented to person, place, and time.  Speech fluent and not dysarthric, language intact.  CN II-XII intact.  Bulk and tone normal, muscle strength 5/5 throughout.  Sensation to light touch intact.  Deep tendon reflexes 2+ throughout, toes downgoing.  Finger to nose testing intact.  Gait normal, Romberg negative.   Metta Clines, DO

## 2021-11-20 ENCOUNTER — Ambulatory Visit (INDEPENDENT_AMBULATORY_CARE_PROVIDER_SITE_OTHER): Payer: Commercial Managed Care - HMO | Admitting: Physician Assistant

## 2021-11-20 ENCOUNTER — Encounter: Payer: Self-pay | Admitting: *Deleted

## 2021-11-20 DIAGNOSIS — Z9889 Other specified postprocedural states: Secondary | ICD-10-CM

## 2021-11-20 NOTE — Progress Notes (Signed)
Patient is a pleasant 56 year old female with PMH of left-sided breast cancer s/p bilateral mastectomy with immediate reconstruction using tissue expander and Flex HD performed 10/29/2021 Dr. Marla Roe presents to clinic for postoperative follow-up.  She was last seen here in clinic on 11/13/2021.  At that time, she was still experiencing muscle spasms unimproved with NSAIDs and Valium.  She was prescribed Flexeril.  Otherwise, she denied any other concerns and her drains were removed.  She informed that Dr. Geralyn Flash plan was for her to initiate chemotherapy in November and to hold off from implant exchange until after completion of her treatments.  She only has 100/455 cc in each of her expanders.  Patient begins chemotherapy treatments December 03, 2021.  She is comfortable with a delayed reconstruction.  There are no plans currently for radiation treatments.  She also tells me that she does not want to be much larger than current volume.  Today, patient states that the muscle spasms have improved with the Flexeril.  She does state that the expanders feel hard and rigid, but otherwise she is without any specific complaints.  Physical exam is largely reassuring.  Steri-Strips were removed.  Expanders are appropriately placed.  No obvious seromas or hematomas.  Upon removal of the Steri-Strips, there is some incisional slough centrally on bilateral mastectomy incisions.  Difficult to assess depth of skin injury.  No visualization of expander.  Given the incisional slough over bilateral mastectomy incisions, will abstain from expander fill today.  Instead, recommending thin film of Vaseline followed by ABD pads.  Continued compressive garments and activity modifications.  Return to clinic next week for follow-up.  She will call the clinic should she experience any new or worsening symptoms in interim.  Picture(s) obtained of the patient and placed in the chart were with the patient's or guardian's  permission.

## 2021-11-21 ENCOUNTER — Other Ambulatory Visit: Payer: Self-pay

## 2021-11-23 ENCOUNTER — Telehealth: Payer: Self-pay | Admitting: Physician Assistant

## 2021-11-23 ENCOUNTER — Encounter: Payer: Self-pay | Admitting: Neurology

## 2021-11-23 ENCOUNTER — Ambulatory Visit: Payer: Commercial Managed Care - HMO | Admitting: Neurology

## 2021-11-23 VITALS — BP 134/90 | HR 96 | Ht 62.0 in | Wt 114.6 lb

## 2021-11-23 DIAGNOSIS — G43009 Migraine without aura, not intractable, without status migrainosus: Secondary | ICD-10-CM

## 2021-11-23 DIAGNOSIS — R519 Headache, unspecified: Secondary | ICD-10-CM | POA: Diagnosis not present

## 2021-11-23 MED ORDER — ZAVZPRET 10 MG/ACT NA SOLN: 10.0000 mg | Freq: Every day | NASAL | 11 refills | Status: DC | PRN

## 2021-11-23 MED ORDER — NORTRIPTYLINE HCL 10 MG PO CAPS: 10.0000 mg | ORAL_CAPSULE | Freq: Every day | ORAL | 5 refills | Status: DC

## 2021-11-23 NOTE — Patient Instructions (Signed)
Start nortriptyline '10mg'$  at bedtime.  If no improvement in 4 weeks, contact me and we can increase dose Try Zavzpret nasal spray daily as needed.  Will need PA first. Follow up 4-5 months.

## 2021-11-23 NOTE — Telephone Encounter (Signed)
Pt calling in to inform PA (as informed) that her incisions have more blood and drainage in them.  Would like a call back to discuss.

## 2021-11-24 ENCOUNTER — Other Ambulatory Visit: Payer: Self-pay

## 2021-11-24 NOTE — Telephone Encounter (Signed)
Spoke with patient. There was incisional slough noted at prior exam which has now turned to a yellow drainage on her ABD pads. She cannot see the expander and reports that the incisions are still well-approximated without any obvious wounds.  She will continue to monitor and call should she have any additional questions or concerns.

## 2021-11-25 ENCOUNTER — Inpatient Hospital Stay: Payer: Commercial Managed Care - HMO

## 2021-11-26 NOTE — Progress Notes (Signed)
Patient is a pleasant 56 year old female with PMH of left-sided breast cancer s/p bilateral mastectomy with immediate reconstruction using tissue expander and Flex HD performed 10/29/2021 Julie Townsend presents to clinic for postoperative follow-up.  She was last seen here in clinic on 11/20/2021.  At that time, she reported that her muscle spasms had improved with Flexeril.  Upon removal of the Steri-Strips, there was some incisional slough centrally on bilateral mastectomy incisions.  Depth of skin injury was difficult to assess, but there was no visualization of expander or obvious full-thickness injury.  Decision was made to abstain from expander fill, particular given that oncology wants her to start chemotherapy December 03, 2021 and implant exchange will be delayed.  Plan is for thin amount of Vaseline followed by ABD pads.  Once it appears healed, we will start slowly expanding to help mitigate risk of capsule formation preventing [place] from further expansion later.  Today, patient is companied by husband.  She states that she feels as though her incisions are healing.  She did experience redness/bruising over medial aspect of left breast earlier this week, precipitating trauma unclear.  However, she applied ice and reports that it has improved considerably.  She does have persistent tenderness at the most inferior aspect of her expander on the left side, but this is unchanged from prior.  She applies a thin film of Vaseline to her incisions and covered with ABD pads for comfort which has helped.  She denies any considerable drainage, purulence, malodor, swelling, or concern for infection.  On physical exam, expanders appear well-positioned.  She does have what appears to be old ecchymoses to medial aspect left expander site.  She is particularly point tender on most inferior aspect of expander, left side.  No redness or other overlying skin changes.  No fluctuance or crepitus.  Feels like the lower edge  of the expander.  No seromas or subcutaneous fluid collections on exam.  The incisions do appear to be improved compared to prior encounter.  There is still a very small amount of incisional slough bilaterally, worse on the right side.  However, there remains no wounds or visualization of the expander.  We will continue to abstain from expander fill today, but will have her return next Friday for reevaluation.  If she is looking healed at that time and there is low suspicion for incisional dehiscence, can proceed with a low volume expander fill of 10 to 20 cc per expander.  Her first chemo treatment is next week.  She will call clinic should she have any new or worsening symptoms in interim.  Picture(s) obtained of the patient and placed in the chart were with the patient's or guardian's permission.

## 2021-11-27 ENCOUNTER — Ambulatory Visit (INDEPENDENT_AMBULATORY_CARE_PROVIDER_SITE_OTHER): Payer: Commercial Managed Care - HMO | Admitting: Physician Assistant

## 2021-11-27 DIAGNOSIS — Z9889 Other specified postprocedural states: Secondary | ICD-10-CM

## 2021-11-29 NOTE — Progress Notes (Unsigned)
Patient Care Team: Pcp, No as PCP - General [name], RN as Oncology Nurse Navigator Mauro Kaufmann, RN as Oncology Nurse Navigator [name], MD as Consulting Physician (General Surgery) Nicholas Lose, MD as Consulting Physician (Hematology and Oncology) Kyung Rudd, MD as Consulting Physician (Radiation Oncology)  DIAGNOSIS: No diagnosis found.  SUMMARY OF ONCOLOGIC HISTORY: Oncology History  Malignant neoplasm of lower-outer quadrant of left breast of female, estrogen receptor negative ([place])  09/07/2021 Initial Diagnosis   Screening mammogram detected left breast asymmetry posteriorly.  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  Report date is September 26, 2021 ([place]) and September 29, 2021 (expanded).    The CancerNext-Expanded gene panel offered by [facility] and includes sequencing, rearrangement, and RNA analysis for the following 77 genes: AIP, ALK, APC, ATM, AXIN2, BAP1, BARD1, BLM, BMPR1A, BRCA1, BRCA2, BRIP1, CDC73, CDH1, CDK4, CDKN1B, CDKN2A, CHEK2, CTNNA1, DICER1, FANCC, FH, FLCN, GALNT12, KIF1B, LZTR1, MAX, MEN1, MET, MLH1, MSH2, MSH3, MSH6, MUTYH, NBN, NF1, NF2, NTHL1, PALB2, PHOX2B, PMS2, POT1, PRKAR1A, PTCH1, PTEN, RAD51C, RAD51D, RB1, RECQL, RET, SDHA, SDHAF2, SDHB, SDHC, SDHD, SMAD4, SMARCA4, SMARCB1, SMARCE1, STK11, SUFU, TMEM127, TP53, TSC1, TSC2, VHL and XRCC2 (sequencing and deletion/duplication); EGFR, EGLN1, HOXB13, KIT, MITF, PDGFRA, POLD1, and POLE  (sequencing only); EPCAM and GREM1 (deletion/duplication only).    10/29/2021 Surgery   Bilateral mastectomies Right mastectomy: Benign, PASH Left mastectomy: Grade 3 IDC with DCIS 1.2 cm, margins negative, 0/5 sentinel lymph nodes negative, ER 0%, PR 0%, HER2 negative, Ki-67 70%   12/03/2021 -  Chemotherapy   Patient is on Treatment Plan : BREAST ADJUVANT DOSE DENSE AC q14d / PACLitaxel q7d       CHIEF COMPLIANT: Follow-up history of left breast cancer  INTERVAL HISTORY: Yunique Dearcos Perez-Valentine is a  56 y.o. female with the above-mentioned left breast cancer. She presents to the clinic for a follow-up.   ALLERGIES:  has No Known Allergies.  MEDICATIONS:  Current Outpatient Medications  Medication Sig Dispense Refill   cyclobenzaprine (FLEXERIL) 5 MG tablet Take 5 mg by mouth 3 (three) times daily as needed for muscle spasms.     dexamethasone (DECADRON) 4 MG tablet Take 4 mg by mouth 2 (two) times daily with a meal.     diazepam (VALIUM) 2 MG tablet Take 1 tablet (2 mg total) by mouth every 12 (twelve) hours as needed for up to 20 doses for muscle spasms. 20 tablet 0   Galcanezumab-gnlm (EMGALITY) 120 MG/ML SOAJ Inject 120 mg into the skin every 28 (twenty-eight) days. 3 mL 1   lidocaine-prilocaine (EMLA) cream Apply to affected area once 30 g 3   nortriptyline (PAMELOR) 10 MG capsule Take 1 capsule (10 mg total) by mouth at bedtime. 30 capsule 5   ondansetron (ZOFRAN) 4 MG tablet Take 1 tablet (4 mg total) by mouth every 8 (eight) hours as needed for up to 20 doses for nausea or vomiting. 20 tablet 0   ondansetron (ZOFRAN) 8 MG tablet Take 1 tab (8 mg) by  mouth every 8 hrs as needed for nausea/vomiting. Start third day after doxorubicin/cyclophosphamide chemotherapy. 30 tablet 1   oxyCODONE (ROXICODONE) 5 MG immediate release tablet Take 1 tablet (5 mg total) by mouth every 8 (eight) hours as needed for up to 20 doses for severe pain. 20 tablet 0   prochlorperazine (COMPAZINE) 10 MG  tablet Take 1 tablet (10 mg total) by mouth every 6 (six) hours as needed for nausea or vomiting. 30 tablet 1   SUMAtriptan (IMITREX) 50 MG tablet Take 1 tablet (50 mg total) by mouth as needed for migraine (May repeat after 2 hours.  Maximum 2 tablets in 24 hours). May repeat in 2 hours if headache persists or recurs. 30 tablet 1   Ubrogepant (UBRELVY) 100 MG TABS TAKE AS NEEDED (TAKE 1 AT THE EARLIST ONSET OF A MIGRAINE. MAY REPEAT IN 2 HOURS. MAX 2/24 HOURS). 30 tablet 1   Zavegepant HCl (ZAVZPRET) 10 MG/ACT SOLN Place 10 mg into the nose daily as needed. 6 each 11   No current facility-administered medications for this visit.    PHYSICAL EXAMINATION: ECOG PERFORMANCE STATUS: {CHL ONC ECOG PS:[id number]}  There were no vitals filed for this visit. There were no vitals filed for this visit.  BREAST:*** No palpable masses or nodules in either right or left breasts. No palpable axillary supraclavicular or infraclavicular adenopathy no breast tenderness or nipple discharge. (exam performed in the presence of a chaperone)  LABORATORY DATA:  I have reviewed the data as listed    Latest Ref Rng & Units 09/16/2021   12:26 PM  CMP  Glucose 70 - 99 mg/dL 91   BUN 6 - 20 mg/dL 9   Creatinine 0.44 - 1.00 mg/dL 0.83   Sodium 135 - 145 mmol/L 140   Potassium 3.5 - 5.1 mmol/L 4.1   Chloride 98 - 111 mmol/L 106   CO2 22 - 32 mmol/L 29   Calcium 8.9 - 10.3 mg/dL 9.3   Total Protein 6.5 - 8.1 g/dL 7.1   Total Bilirubin 0.3 - 1.2 mg/dL 0.2   Alkaline Phos 38 - 126 U/L 65   AST 15 - 41 U/L 43   ALT 0 - 44 U/L 37     Lab Results  Component Value Date   WBC 6.4 09/16/2021   HGB 13.8 09/16/2021   HCT 40.1 09/16/2021   MCV 91.3 09/16/2021   PLT 192 09/16/2021   NEUTROABS 1.4 (L) 09/16/2021    ASSESSMENT & PLAN:  No problem-specific Assessment & Plan notes found for this encounter.    No orders of the defined types were placed in this encounter.  The patient has a good understanding of  the overall plan. she agrees with it. she will call with any problems that may develop before the next visit here. Total time spent: 30 mins including face to face time and time spent for planning, charting and co-ordination of care   Suzzette Righter, [place] 11/29/21

## 2021-12-01 ENCOUNTER — Other Ambulatory Visit: Payer: Self-pay | Admitting: Neurology

## 2021-12-02 ENCOUNTER — Encounter: Payer: Self-pay | Admitting: Hematology and Oncology

## 2021-12-02 MED FILL — Dexamethasone Sodium Phosphate Inj 100 MG/10ML: INTRAMUSCULAR | Qty: 1 | Status: AC

## 2021-12-02 MED FILL — Fosaprepitant Dimeglumine For IV Infusion 150 MG (Base Eq): INTRAVENOUS | Qty: 5 | Status: AC

## 2021-12-02 NOTE — Progress Notes (Signed)
Called pt to introduce myself as her Financial Resource Specialist and to discuss the [employer] grant.  I left a msg requesting she return my call if she's interested in applying for the grant.  ?

## 2021-12-03 ENCOUNTER — Inpatient Hospital Stay: Payer: Commercial Managed Care - HMO | Admitting: Adult Health

## 2021-12-03 ENCOUNTER — Inpatient Hospital Stay: Payer: Commercial Managed Care - HMO

## 2021-12-03 ENCOUNTER — Other Ambulatory Visit: Payer: Self-pay

## 2021-12-03 ENCOUNTER — Inpatient Hospital Stay: Payer: Commercial Managed Care - HMO | Attending: Hematology and Oncology | Admitting: Hematology and Oncology

## 2021-12-03 VITALS — BP 141/89 | HR 84 | Resp 16

## 2021-12-03 VITALS — BP 128/90 | HR 87 | Temp 97.3°F | Resp 18

## 2021-12-03 DIAGNOSIS — Z9013 Acquired absence of bilateral breasts and nipples: Secondary | ICD-10-CM | POA: Diagnosis not present

## 2021-12-03 DIAGNOSIS — Z5111 Encounter for antineoplastic chemotherapy: Secondary | ICD-10-CM | POA: Diagnosis not present

## 2021-12-03 DIAGNOSIS — R5383 Other fatigue: Secondary | ICD-10-CM | POA: Insufficient documentation

## 2021-12-03 DIAGNOSIS — D709 Neutropenia, unspecified: Secondary | ICD-10-CM | POA: Diagnosis not present

## 2021-12-03 DIAGNOSIS — Z171 Estrogen receptor negative status [ER-]: Secondary | ICD-10-CM

## 2021-12-03 DIAGNOSIS — C50512 Malignant neoplasm of lower-outer quadrant of left female breast: Secondary | ICD-10-CM

## 2021-12-03 DIAGNOSIS — Z79899 Other long term (current) drug therapy: Secondary | ICD-10-CM | POA: Diagnosis not present

## 2021-12-03 DIAGNOSIS — Z7952 Long term (current) use of systemic steroids: Secondary | ICD-10-CM | POA: Diagnosis not present

## 2021-12-03 DIAGNOSIS — R110 Nausea: Secondary | ICD-10-CM | POA: Insufficient documentation

## 2021-12-03 DIAGNOSIS — D6481 Anemia due to antineoplastic chemotherapy: Secondary | ICD-10-CM | POA: Diagnosis not present

## 2021-12-03 DIAGNOSIS — T451X5A Adverse effect of antineoplastic and immunosuppressive drugs, initial encounter: Secondary | ICD-10-CM | POA: Diagnosis not present

## 2021-12-03 LAB — CMP (CANCER CENTER ONLY)
ALT: 9 U/L (ref 0–44)
AST: 15 U/L (ref 15–41)
Albumin: 3.9 g/dL (ref 3.5–5.0)
Alkaline Phosphatase: 61 U/L (ref 38–126)
Anion gap: 8 (ref 5–15)
BUN: 8 mg/dL (ref 6–20)
CO2: 27 mmol/L (ref 22–32)
Calcium: 9.4 mg/dL (ref 8.9–10.3)
Chloride: 103 mmol/L (ref 98–111)
Creatinine: 0.56 mg/dL (ref 0.44–1.00)
GFR, Estimated: >60 mL/min (ref >60)
Glucose, Bld: 95 mg/dL (ref 70–99)
Potassium: 3.4 mmol/L — ABNORMAL LOW (ref 3.5–5.1)
Sodium: 138 mmol/L (ref 135–145)
Total Bilirubin: 0.4 mg/dL (ref 0.3–1.2)
Total Protein: 7.3 g/dL (ref 6.5–8.1)

## 2021-12-03 LAB — CBC WITH DIFFERENTIAL (CANCER CENTER ONLY)
Abs Immature Granulocytes: 0.05 10*3/uL (ref 0.00–0.07)
Basophils Absolute: 0.1 10*3/uL (ref 0.0–0.1)
Basophils Relative: 1 %
Eosinophils Absolute: 0.2 10*3/uL (ref 0.0–0.5)
Eosinophils Relative: 2 %
HCT: 34 % — ABNORMAL LOW (ref 36.0–46.0)
Hemoglobin: 11.6 g/dL — ABNORMAL LOW (ref 12.0–15.0)
Immature Granulocytes: 1 %
Lymphocytes Relative: 21 %
Lymphs Abs: 2.3 10*3/uL (ref 0.7–4.0)
MCH: 31.5 pg (ref 26.0–34.0)
MCHC: 34.1 g/dL (ref 30.0–36.0)
MCV: 92.4 fL (ref 80.0–100.0)
Monocytes Absolute: 0.7 10*3/uL (ref 0.1–1.0)
Monocytes Relative: 7 %
Neutro Abs: 7.3 10*3/uL (ref 1.7–7.7)
Neutrophils Relative %: 68 %
Platelet Count: 288 10*3/uL (ref 150–400)
RBC: 3.68 MIL/uL — ABNORMAL LOW (ref 3.87–5.11)
RDW: 13.1 % (ref 11.5–15.5)
WBC Count: 10.6 10*3/uL — ABNORMAL HIGH (ref 4.0–10.5)
nRBC: 0 % (ref 0.0–0.2)

## 2021-12-03 MED ORDER — SODIUM CHLORIDE 0.9 % IV SOLN
10.0000 mg | Freq: Once | INTRAVENOUS | Status: AC
  Administered 2021-12-03: 10 mg via INTRAVENOUS
  Filled 2021-12-03: qty 10

## 2021-12-03 MED ORDER — SODIUM CHLORIDE 0.9 % IV SOLN
150.0000 mg | Freq: Once | INTRAVENOUS | Status: AC
  Administered 2021-12-03: 150 mg via INTRAVENOUS
  Filled 2021-12-03: qty 150

## 2021-12-03 MED ORDER — SODIUM CHLORIDE 0.9% FLUSH
10.0000 mL | INTRAVENOUS | Status: DC | PRN
  Administered 2021-12-03: 10 mL

## 2021-12-03 MED ORDER — HEPARIN SOD (PORK) LOCK FLUSH 100 UNIT/ML IV SOLN
500.0000 [IU] | Freq: Once | INTRAVENOUS | Status: AC | PRN
  Administered 2021-12-03: 500 [IU]

## 2021-12-03 MED ORDER — SODIUM CHLORIDE 0.9% FLUSH
10.0000 mL | INTRAVENOUS | Status: DC | PRN
  Administered 2021-12-03: 10 mL via INTRAVENOUS

## 2021-12-03 MED ORDER — SODIUM CHLORIDE 0.9 % IV SOLN: Freq: Once | INTRAVENOUS | Status: AC

## 2021-12-03 MED ORDER — PALONOSETRON HCL INJECTION 0.25 MG/5ML
0.2500 mg | Freq: Once | INTRAVENOUS | Status: AC
  Administered 2021-12-03: 0.25 mg via INTRAVENOUS
  Filled 2021-12-03: qty 5

## 2021-12-03 MED ORDER — SODIUM CHLORIDE 0.9 % IV SOLN
600.0000 mg/m2 | Freq: Once | INTRAVENOUS | Status: AC
  Administered 2021-12-03: 900 mg via INTRAVENOUS
  Filled 2021-12-03: qty 45

## 2021-12-03 MED ORDER — DOXORUBICIN HCL CHEMO IV INJECTION 2 MG/ML
60.0000 mg/m2 | Freq: Once | INTRAVENOUS | Status: AC
  Administered 2021-12-03: 90 mg via INTRAVENOUS
  Filled 2021-12-03: qty 45

## 2021-12-03 NOTE — Patient Instructions (Signed)
[facility] ONCOLOGY  Discharge Instructions: Thank you for choosing [facility] to provide your oncology and hematology care.   If you have a lab appointment with the [facility], please go directly to the [facility] and check in at the registration area.   Wear comfortable clothing and clothing appropriate for easy access to any Portacath or PICC line.   We strive to give you quality time with your provider. You may need to reschedule your appointment if you arrive late (15 or more minutes).  Arriving late affects you and other patients whose appointments are after yours.  Also, if you miss three or more appointments without notifying the office, you may be dismissed from the clinic at the provider's discretion.      For prescription refill requests, have your pharmacy contact our office and allow 72 hours for refills to be completed.    Today you received the following chemotherapy and/or immunotherapy agents: Doxorubicin (Adriamycin)/Cyclophosphamide (Cytoxan)      To help prevent nausea and vomiting after your treatment, we encourage you to take your nausea medication as directed.  BELOW ARE SYMPTOMS THAT SHOULD BE REPORTED IMMEDIATELY: *FEVER GREATER THAN 100.4 F (38 C) OR HIGHER *CHILLS OR SWEATING *NAUSEA AND VOMITING THAT IS NOT CONTROLLED WITH YOUR NAUSEA MEDICATION *UNUSUAL SHORTNESS OF BREATH *UNUSUAL BRUISING OR BLEEDING *URINARY PROBLEMS (pain or burning when urinating, or frequent urination) *BOWEL PROBLEMS (unusual diarrhea, constipation, pain near the anus) TENDERNESS IN MOUTH AND THROAT WITH OR WITHOUT PRESENCE OF ULCERS (sore throat, sores in mouth, or a toothache) UNUSUAL RASH, SWELLING OR PAIN  UNUSUAL VAGINAL DISCHARGE OR ITCHING   Items with * indicate a potential emergency and should be followed up as soon as possible or go to the Emergency Department if any problems should occur.  Please show the CHEMOTHERAPY ALERT CARD  or IMMUNOTHERAPY ALERT CARD at check-in to the Emergency Department and triage nurse.  Should you have questions after your visit or need to cancel or reschedule your appointment, please contact [facility]  Dept: [phone]  and follow the prompts.  Office hours are 8:00 a.m. to 4:30 p.m. Monday - Friday. Please note that voicemails left after 4:00 p.m. may not be returned until the following business day.  We are closed weekends and major holidays. You have access to a nurse at all times for urgent questions. Please call the main number to the clinic Dept: [phone] and follow the prompts.   For any non-urgent questions, you may also contact your provider using MyChart. We now offer e-Visits for anyone 53 and older to request care online for non-urgent symptoms. For details visit [email or web address].   Also download the MyChart app! Go to the app store, search "MyChart", open the app, select [facility], and log in with your MyChart username and password.  Masks are optional in the cancer centers. If you would like for your care team to wear a mask while they are taking care of you, please let them know. You may have one support person who is at least 56 years old accompany you for your appointments.

## 2021-12-03 NOTE — Assessment & Plan Note (Signed)
09/07/2021:Screening mammogram detected left breast asymmetry posteriorly.  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, margins negative, 0/5 sentinel lymph nodes negative, ER 0%, PR 0%, HER2 negative, Ki-67 70%   Treatment plan: Adjuvant chemotherapy with Adriamycin and Cytoxan followed by Taxol started 12/03/2021 No role of adjuvant radiation or antiestrogen therapy --------------------------------------------------------------------------------------------------------------------------------------------------------------------- Current treatment: Cycle 1 day 1 dose dense Adriamycin and Cytoxan Chemo consent obtained, chemo education completed, antiemetics were reviewed, labs are reviewed Echocardiogram 09/22/2021: EF 60 to 65% Return to clinic in 1 week for toxicity check

## 2021-12-04 ENCOUNTER — Telehealth: Payer: Self-pay

## 2021-12-04 ENCOUNTER — Encounter: Payer: Self-pay | Admitting: Student

## 2021-12-04 ENCOUNTER — Ambulatory Visit (INDEPENDENT_AMBULATORY_CARE_PROVIDER_SITE_OTHER): Payer: Commercial Managed Care - HMO | Admitting: Student

## 2021-12-04 VITALS — BP 135/90 | HR 85 | Temp 98.2°F

## 2021-12-04 DIAGNOSIS — Z9889 Other specified postprocedural states: Secondary | ICD-10-CM

## 2021-12-04 MED ORDER — SULFAMETHOXAZOLE-TRIMETHOPRIM 800-160 MG PO TABS: 1.0000 | ORAL_TABLET | Freq: Two times a day (BID) | ORAL | 0 refills | Status: AC

## 2021-12-04 NOTE — Telephone Encounter (Signed)
Julie Townsend states that she is feeling better this morning.  She has used the compazine for nausea with good effect.  She is drinking fluids and urinating well. She knows to call the office at [phone] if she has any questions or concerns.

## 2021-12-04 NOTE — Telephone Encounter (Signed)
-----   Message from Willis Modena, RN sent at 12/03/2021  3:55 PM EDT ----- Regarding: Dr. Lindi Adie 1st time A/C f/u tolerated well Dr. Lindi Adie 1st time Adriamycin/Cytoxan. Pt tolerated tx well without incident. Pt did experience some nausea without vomiting during Adriamycin push. Can you please check with her on her nausea? Pt call back due.

## 2021-12-04 NOTE — Progress Notes (Signed)
Patient is a pleasant 56 year old female with PMH of left-sided breast cancer s/p bilateral mastectomy with immediate reconstruction using tissue expander and Flex HD performed 10/29/2021 Dr. Marla Roe presents to clinic for postoperative follow-up.  Intraoperatively, patient had 455 cc expanders placed bilaterally and 100 cc was placed into each expander.     Patient was last seen in the clinic on 11/27/2021.  At this visit, patient reported that she felt her incisions were healing.  Patient was found to have some old ecchymosis to the medial aspect of her left expander site.  The incisions appear to be improving compared to her prior exam, but there is still a small amount of incisional slough bilaterally, worse on the right side.  There were no wounds or visualization of the expander.  Plan was to hold off on an expander fill, but have the patient come back in about a week for reevaluation and possibly a low volume expander fill.  Today, patient reports she is doing well.  She states that she feels her incisions have completely healed.  Patient reports that she started chemotherapy yesterday she states that she has been feeling a little bit nauseous and sick since yesterday when she started it.  She denies any fevers or chills.  Patient reports that her right breast is a little bit red but she feels it is related to her compression bra.  She states that when she takes her compression bra off, the redness goes away.  Patient also states that she bumped the area yesterday when she was heading out the door to go to chemotherapy.  She states that she had a similar experience to the left breast previously, which had completely resolved on its own.  Patient also reports that she has been experiencing a point tenderness to the inferior aspect of her left breast.  She also states that there is a suture to the right breast incision that has been bothering her.  She denies any other issues or concerns.  Chaperone  present on exam.  On exam, patient is sitting upright in no acute distress.  Expanders are in place bilaterally.  There is erythema noted to be spanning about two thirds of the inferior aspect of the skin over the right breast.  It is blanchable.  There is no fluid collection palpated underneath.  There is no fluctuance noted. To the right breast incision, there are 2 very small areas with some overlying exudate versus scabbing.  There is no drainage or surrounding erythema to the incision.  Incision is otherwise intact and healing well.  There are no wounds noted. There is no visualization of the breast expander.    There is a small suture noted to be poking out at the lateral aspect of the right breast incision.  This was trimmed.  Patient tolerated well.   The left breast has no overlying erythema or ecchymosis. The left breast incision has a small similar area as to the right with some overlying exudate versus scabbing.  There is no drainage or surrounding erythema to the incision.  The left breast incision is otherwise intact.  There are no wounds noted and there is no visualization of the breast expander.  There is point tenderness noted to the midline of the inframammary aspect of the breast.  It appears to be consistent with the inferior aspect of the breast expander..  There is no overlying erythema or skin changes to this area.  Vitals were obtained at today's appointment.  Vitals are  stable.  Patient is afebrile.  I discussed with the patient that given the overlying erythema, we are going to hold off on an expander fill today.  I discussed with her that we will need to start her on antibiotics as the redness may be due to a possible infection.  I discussed with the patient I will send Bactrim in for her.  Patient expressed understanding.  I discussed with the patient that she is going to need to closely monitor the area and her symptoms.  I drew an outline of the redness with a sterile marker to  the patient's skin.  I discussed with the patient that if the redness begins to spread outside of the lines that she needs to call us.  I discussed with her that if she develops any fevers, chills, worsening pain, worsening redness, swelling, or if she notices any changes whatsoever or worsening symptoms that she needs to call us.  I discussed with her that if she does have worsening symptoms, that she may need to go to the emergency room.  Patient expressed understanding.  I discussed with the patient that she is going to need to follow-up with [place] on Monday.  I expressed the importance of the patient following up with [place] and calling us if anything changes.  Patient expressed understanding.  Instructed the patient to call if she has any questions or concerns.  Pictures were obtained of the patient and placed in the chart with the patient's or guardian's permission.

## 2021-12-05 ENCOUNTER — Inpatient Hospital Stay: Payer: Commercial Managed Care - HMO

## 2021-12-05 VITALS — BP 142/91 | HR 93 | Temp 98.2°F

## 2021-12-05 DIAGNOSIS — C50512 Malignant neoplasm of lower-outer quadrant of left female breast: Secondary | ICD-10-CM | POA: Diagnosis not present

## 2021-12-05 DIAGNOSIS — Z171 Estrogen receptor negative status [ER-]: Secondary | ICD-10-CM

## 2021-12-05 MED ORDER — PEGFILGRASTIM-CBQV 6 MG/0.6ML ~~LOC~~ SOSY
6.0000 mg | PREFILLED_SYRINGE | Freq: Once | SUBCUTANEOUS | Status: AC
  Administered 2021-12-05: 6 mg via SUBCUTANEOUS

## 2021-12-05 NOTE — Patient Instructions (Signed)

## 2021-12-07 ENCOUNTER — Ambulatory Visit (INDEPENDENT_AMBULATORY_CARE_PROVIDER_SITE_OTHER): Payer: Commercial Managed Care - HMO | Admitting: Physician Assistant

## 2021-12-07 VITALS — BP 127/79 | HR 110 | Temp 97.9°F

## 2021-12-07 DIAGNOSIS — Z9889 Other specified postprocedural states: Secondary | ICD-10-CM

## 2021-12-07 MED ORDER — OXYCODONE HCL 5 MG PO TABS: 5.0000 mg | ORAL_TABLET | Freq: Four times a day (QID) | ORAL | 0 refills | Status: AC | PRN

## 2021-12-07 MED ORDER — SULFAMETHOXAZOLE-TRIMETHOPRIM 800-160 MG PO TABS: 1.0000 | ORAL_TABLET | Freq: Two times a day (BID) | ORAL | 0 refills | Status: AC

## 2021-12-07 NOTE — Progress Notes (Signed)
Patient is a pleasant 56 year old female with PMH of left-sided breast cancer s/p bilateral mastectomy with immediate reconstruction using tissue expander and Flex HD performed 10/29/2021 Dr. Marla Roe presents to clinic for postoperative follow-up.   She was last seen here in clinic on 12/04/2021.  At that time, she was feeling nauseated and mildly ill from starting the chemotherapy the day before.  She feels as though she had bumped her right breast and it was red appearing, but that when she would take her compression bra off it would go away spontaneously.  She feels as though this is similar to what occurred in the left side a week prior.  On exam, there was considerable erythema to the inferior aspect right breast.  No significant incisional wounds or dehiscence noted.  Left breast exam was benign.  Vitals were obtained and negative.  Decision was made to abstain from expander fill and instead prescribe Bactrim and reassess closely.  Expander volumes are 100/455 cc each.  Today, patient tells me that they have been watching the right breast closely and report that there has been mild improvement in terms of her redness, but states that it is still swollen and exquisitely painful.  She tells me that she hit her right breast with a car door while rushing to her chemotherapy appointment 12/03/2021 and her symptoms did not begin until the following day.  She has been taking her Bactrim, as directed.  She states that she no longer has any redness above the incision, but that inferior aspect of the expander placement site is particularly painful and unrelieved with over-the-counter analgesics.  She denies any fevers at home and has been closely monitoring her symptoms.  On physical exam, she does have swelling and erythema involving the inferior aspect of right breast expander site placement.  The area is tender.  No obvious palpable fluid collection.  There has been no redness spreading beyond the marker  outline placed at last visit.  We will prescribe patient a short course of pain medication and extend her Bactrim from a 5-day course to a 14-day course.  She understands that if her right breast redness and discomfort does not continue to improve with oral antibiotics, that she may require expander removal and washout +/-expander placement.  May require delayed reconstruction efforts given that she is currently undergoing chemotherapy.    Patient to return to clinic in 4 days for reevaluation.  At that time, we will also have patient speak with Dr. Marla Roe.  Picture(s) obtained of the patient and placed in the chart were with the patient's or guardian's permission.

## 2021-12-08 ENCOUNTER — Other Ambulatory Visit: Payer: Self-pay | Admitting: *Deleted

## 2021-12-08 DIAGNOSIS — C50512 Malignant neoplasm of lower-outer quadrant of left female breast: Secondary | ICD-10-CM

## 2021-12-09 ENCOUNTER — Other Ambulatory Visit: Payer: Self-pay | Admitting: Hematology and Oncology

## 2021-12-09 ENCOUNTER — Other Ambulatory Visit: Payer: Self-pay | Admitting: *Deleted

## 2021-12-09 DIAGNOSIS — C50512 Malignant neoplasm of lower-outer quadrant of left female breast: Secondary | ICD-10-CM

## 2021-12-09 MED ORDER — UDENYCA 6 MG/0.6ML ~~LOC~~ SOAJ: 2.0000 | Freq: Once | SUBCUTANEOUS | 2 refills | Status: AC

## 2021-12-09 MED ORDER — UDENYCA 6 MG/0.6ML ~~LOC~~ SOAJ: 2.0000 | Freq: Once | SUBCUTANEOUS | 2 refills | Status: DC

## 2021-12-09 NOTE — Progress Notes (Signed)
Received message from [facility] team stating pt needing to receive Udenyca injection at home with [facility] treatment.  RN successfully faxed Udenyca order and nursing order to [facility] [phone]) as well as a copy to [place] at [phone].

## 2021-12-09 NOTE — Progress Notes (Signed)
Patient Care Team: Pcp, No as PCP - General Donnelly Angelica, RN as Oncology Nurse Navigator Pershing Proud, RN as Oncology Nurse Navigator Abigail Miyamoto, MD as Consulting Physician (General Surgery) Serena Croissant, MD as Consulting Physician (Hematology and Oncology) Dorothy Puffer, MD as Consulting Physician (Radiation Oncology)  DIAGNOSIS:  Encounter Diagnosis  Name Primary?   Malignant neoplasm of lower-outer quadrant of left breast of female, estrogen receptor negative (HCC) Yes    SUMMARY OF ONCOLOGIC HISTORY: Oncology History  Malignant neoplasm of lower-outer quadrant of left breast of female, estrogen receptor negative (HCC)  09/07/2021 Initial Diagnosis   Screening mammogram detected left breast asymmetry posteriorly.  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  Report date is September 26, 2021 ([facility]) and September 29, 2021 (expanded).    The CancerNext-Expanded gene panel offered by [facility] and includes sequencing, rearrangement, and RNA analysis for the following 77 genes: AIP, ALK, APC, ATM, AXIN2, BAP1, BARD1, BLM, BMPR1A, BRCA1, BRCA2, BRIP1, CDC73, CDH1, CDK4, CDKN1B, CDKN2A, CHEK2, CTNNA1, DICER1, FANCC, FH, FLCN, GALNT12, KIF1B, LZTR1, MAX, MEN1, MET, MLH1, MSH2, MSH3, MSH6, MUTYH, NBN, NF1, NF2, NTHL1, PALB2, PHOX2B, PMS2, POT1, PRKAR1A, PTCH1, PTEN, RAD51C, RAD51D, RB1, RECQL, RET, SDHA, SDHAF2, SDHB, SDHC, SDHD, SMAD4, SMARCA4, SMARCB1, SMARCE1, STK11, SUFU, TMEM127, TP53,  TSC1, TSC2, VHL and XRCC2 (sequencing and deletion/duplication); EGFR, EGLN1, HOXB13, KIT, MITF, PDGFRA, POLD1, and POLE (sequencing only); EPCAM and GREM1 (deletion/duplication only).    10/29/2021 Surgery   Bilateral mastectomies Right mastectomy: Benign, PASH Left mastectomy: Grade 3 IDC with DCIS 1.2 cm, margins negative, 0/5 sentinel lymph nodes negative, ER 0%, PR 0%, HER2 negative, Ki-67 70%   12/03/2021 -  Chemotherapy   Patient is on Treatment Plan : BREAST ADJUVANT DOSE DENSE AC q14d / PACLitaxel q7d       CHIEF COMPLIANT: Follow-up cycle 1 day AC  INTERVAL HISTORY: Julie Townsend is a 56 y.o. female with the above-mentioned left breast cancer. She presents to the clinic for cycle 1 day 8 dose dense Adriamycin and Cytoxan. She states that she had some mild nausea. She complains of the expanders bothering her. Overall she tolerated treatment.   ALLERGIES:  has No Known Allergies.  MEDICATIONS:  Current Outpatient Medications  Medication Sig Dispense Refill   cyclobenzaprine (FLEXERIL) 5 MG tablet Take 5 mg by mouth 3 (three) times daily as needed for muscle spasms.     dexamethasone (DECADRON) 4 MG tablet Take 4 mg by mouth 2 (two) times daily with a meal.     diazepam (VALIUM) 2 MG tablet Take 1 tablet (2 mg total) by mouth every 12 (twelve) hours as needed for up to 20 doses for muscle spasms. 20 tablet 0   Galcanezumab-gnlm (EMGALITY) 120 MG/ML SOAJ Inject 120 mg into the skin every 28 (twenty-eight) days. 3 mL 1   lidocaine-prilocaine (EMLA) cream Apply to affected area once 30 g 3   nortriptyline (PAMELOR) 10 MG capsule Take 1 capsule (10 mg total) by mouth at bedtime. 30 capsule 5  ondansetron (ZOFRAN) 4 MG tablet Take 1 tablet (4 mg total) by mouth every 8 (eight) hours as needed for up to 20 doses for nausea or vomiting. 20 tablet 0   ondansetron (ZOFRAN) 8 MG tablet Take 1 tab (8 mg) by mouth every 8 hrs as needed for nausea/vomiting. Start third day after  doxorubicin/cyclophosphamide chemotherapy. 30 tablet 1   oxyCODONE (ROXICODONE) 5 MG immediate release tablet Take 1 tablet (5 mg total) by mouth every 8 (eight) hours as needed for up to 20 doses for severe pain. 20 tablet 0   oxyCODONE (ROXICODONE) 5 MG immediate release tablet Take 1 tablet (5 mg total) by mouth every 6 (six) hours as needed for up to 7 days for severe pain. 20 tablet 0   prochlorperazine (COMPAZINE) 10 MG tablet Take 1 tablet (10 mg total) by mouth every 6 (six) hours as needed for nausea or vomiting. 30 tablet 1   sulfamethoxazole-trimethoprim (BACTRIM DS) 800-160 MG tablet Take 1 tablet by mouth 2 (two) times daily for 9 days. 18 tablet 0   SUMAtriptan (IMITREX) 50 MG tablet Take 1 tablet (50 mg total) by mouth as needed for migraine (May repeat after 2 hours.  Maximum 2 tablets in 24 hours). May repeat in 2 hours if headache persists or recurs. 30 tablet 1   Ubrogepant (UBRELVY) 100 MG TABS TAKE AS NEEDED (TAKE 1 AT THE EARLIST ONSET OF A MIGRAINE. MAY REPEAT IN 2 HOURS. MAX 2/24 HOURS). 30 tablet 1   Zavegepant HCl (ZAVZPRET) 10 MG/ACT SOLN Place 10 mg into the nose daily as needed. 6 each 11   No current facility-administered medications for this visit.    PHYSICAL EXAMINATION: ECOG PERFORMANCE STATUS: 1 - Symptomatic but completely ambulatory  Vitals:   12/10/21 1055  BP: 105/61  Pulse: (!) 101  Resp: 18  Temp: 97.7 F (36.5 C)  SpO2: 97%   Filed Weights   12/10/21 1055  Weight: 111 lb 6.4 oz (50.5 kg)      LABORATORY DATA:  I have reviewed the data as listed    Latest Ref Rng & Units 12/03/2021   12:12 PM 09/16/2021   12:26 PM  CMP  Glucose 70 - 99 mg/dL 95  91   BUN 6 - 20 mg/dL 8  9   Creatinine 5.64 - 1.00 mg/dL 3.32  9.51   Sodium 884 - 145 mmol/L 138  140   Potassium 3.5 - 5.1 mmol/L 3.4  4.1   Chloride 98 - 111 mmol/L 103  106   CO2 22 - 32 mmol/L 27  29   Calcium 8.9 - 10.3 mg/dL 9.4  9.3   Total Protein 6.5 - 8.1 g/dL 7.3  7.1   Total  Bilirubin 0.3 - 1.2 mg/dL 0.4  0.2   Alkaline Phos 38 - 126 U/L 61  65   AST 15 - 41 U/L 15  43   ALT 0 - 44 U/L 9  37     Lab Results  Component Value Date   WBC 1.4 (L) 12/10/2021   HGB 10.9 (L) 12/10/2021   HCT 32.5 (L) 12/10/2021   MCV 93.4 12/10/2021   PLT 143 (L) 12/10/2021   NEUTROABS PENDING 12/10/2021    ASSESSMENT & PLAN:  Malignant neoplasm of lower-outer quadrant of left breast of female, estrogen receptor negative (HCC) 09/07/2021:Screening mammogram detected left breast asymmetry posteriorly.  Measured 1.3 cm by ultrasound at 5 o'clock position, axilla negative: Biopsy revealed grade 3 IDC ER 0% PR 0%  HER2 negative, Ki-67 70% 10/29/2021: Bilateral mastectomies (BRCA2 gene mutation positive) Right mastectomy: Benign, PASH Left mastectomy: Grade 3 IDC with DCIS 1.2 cm, margins negative, 0/5 sentinel lymph nodes negative, ER 0%, PR 0%, HER2 negative, Ki-67 70%   Treatment plan: Adjuvant chemotherapy with Adriamycin and Cytoxan followed by Taxol started 12/03/2021 No role of adjuvant radiation or antiestrogen therapy --------------------------------------------------------------------------------------------------------------------------------------------------------------------- Current treatment: Cycle 1 day 8 dose dense Adriamycin and Cytoxan Echocardiogram 09/22/2021: EF 60 to 65%  Chemotoxicities: Mild nausea Mild fatigue Chemotherapy-induced anemia Chemotherapy-induced leukopenia: We are waiting for today's ANC to decide on the next treatment dose.  Breast pain: Related to trauma on the right breast by running into a open car door.  She is currently on antibiotics because there is slight concern for infection but she is not having any fevers.  She is currently on Bactrim.  For cycle 3 onwards she is going to get these Neulasta injections with the home health nurse at her home.  Return to clinic in 1 week for cycle 2    No orders of the defined types were placed  in this encounter.  The patient has a good understanding of the overall plan. she agrees with it. she will call with any problems that may develop before the next visit here. Total time spent: 30 mins including face to face time and time spent for planning, charting and co-ordination of care   Tamsen Meek, MD 12/10/21    I Janan Ridge am scribing for Dr. Pamelia Hoit  I have reviewed the above documentation for accuracy and completeness, and I agree with the above.

## 2021-12-10 ENCOUNTER — Inpatient Hospital Stay (HOSPITAL_BASED_OUTPATIENT_CLINIC_OR_DEPARTMENT_OTHER): Payer: Commercial Managed Care - HMO | Admitting: Hematology and Oncology

## 2021-12-10 ENCOUNTER — Other Ambulatory Visit: Payer: Self-pay

## 2021-12-10 ENCOUNTER — Inpatient Hospital Stay: Payer: Commercial Managed Care - HMO

## 2021-12-10 VITALS — BP 105/61 | HR 101 | Temp 97.7°F | Resp 18 | Ht 62.0 in | Wt 111.4 lb

## 2021-12-10 DIAGNOSIS — Z171 Estrogen receptor negative status [ER-]: Secondary | ICD-10-CM

## 2021-12-10 DIAGNOSIS — C50512 Malignant neoplasm of lower-outer quadrant of left female breast: Secondary | ICD-10-CM | POA: Diagnosis not present

## 2021-12-10 LAB — CMP (CANCER CENTER ONLY)
ALT: 5 U/L (ref 0–44)
AST: 8 U/L — ABNORMAL LOW (ref 15–41)
Albumin: 3.6 g/dL (ref 3.5–5.0)
Alkaline Phosphatase: 66 U/L (ref 38–126)
Anion gap: 7 (ref 5–15)
BUN: 19 mg/dL (ref 6–20)
CO2: 25 mmol/L (ref 22–32)
Calcium: 9.1 mg/dL (ref 8.9–10.3)
Chloride: 104 mmol/L (ref 98–111)
Creatinine: 0.63 mg/dL (ref 0.44–1.00)
GFR, Estimated: >60 mL/min (ref >60)
Glucose, Bld: 100 mg/dL — ABNORMAL HIGH (ref 70–99)
Potassium: 4.1 mmol/L (ref 3.5–5.1)
Sodium: 136 mmol/L (ref 135–145)
Total Bilirubin: 0.2 mg/dL — ABNORMAL LOW (ref 0.3–1.2)
Total Protein: 6.4 g/dL — ABNORMAL LOW (ref 6.5–8.1)

## 2021-12-10 LAB — CBC WITH DIFFERENTIAL (CANCER CENTER ONLY)
Abs Immature Granulocytes: 0 10*3/uL (ref 0.00–0.07)
Basophils Absolute: 0.1 10*3/uL (ref 0.0–0.1)
Basophils Relative: 4 %
Eosinophils Absolute: 0.2 10*3/uL (ref 0.0–0.5)
Eosinophils Relative: 15 %
HCT: 32.5 % — ABNORMAL LOW (ref 36.0–46.0)
Hemoglobin: 10.9 g/dL — ABNORMAL LOW (ref 12.0–15.0)
Immature Granulocytes: 0 %
Lymphocytes Relative: 64 %
Lymphs Abs: 0.9 10*3/uL (ref 0.7–4.0)
MCH: 31.3 pg (ref 26.0–34.0)
MCHC: 33.5 g/dL (ref 30.0–36.0)
MCV: 93.4 fL (ref 80.0–100.0)
Monocytes Absolute: 0.1 10*3/uL (ref 0.1–1.0)
Monocytes Relative: 8 %
Neutro Abs: 0.1 10*3/uL — CL (ref 1.7–7.7)
Neutrophils Relative %: 9 %
Platelet Count: 143 10*3/uL — ABNORMAL LOW (ref 150–400)
RBC: 3.48 MIL/uL — ABNORMAL LOW (ref 3.87–5.11)
RDW: 13 % (ref 11.5–15.5)
Smear Review: NORMAL
WBC Count: 1.4 10*3/uL — ABNORMAL LOW (ref 4.0–10.5)
nRBC: 0 % (ref 0.0–0.2)

## 2021-12-10 MED ORDER — HEPARIN SOD (PORK) LOCK FLUSH 100 UNIT/ML IV SOLN
500.0000 [IU] | Freq: Once | INTRAVENOUS | Status: AC
  Administered 2021-12-10: 500 [IU] via INTRAVENOUS

## 2021-12-10 MED ORDER — SODIUM CHLORIDE 0.9% FLUSH
10.0000 mL | INTRAVENOUS | Status: DC | PRN
  Administered 2021-12-10: 10 mL via INTRAVENOUS

## 2021-12-10 NOTE — Assessment & Plan Note (Addendum)
09/07/2021:Screening mammogram detected left breast asymmetry posteriorly.  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, margins negative, 0/5 sentinel lymph nodes negative, ER 0%, PR 0%, HER2 negative, Ki-67 70%   Treatment plan: Adjuvant chemotherapy with Adriamycin and Cytoxan followed by Taxol started 12/03/2021 No role of adjuvant radiation or antiestrogen therapy --------------------------------------------------------------------------------------------------------------------------------------------------------------------- Current treatment: Cycle 1 day 8 dose dense Adriamycin and Cytoxan Echocardiogram 09/22/2021: EF 60 to 65%  Chemotoxicities: Mild nausea Mild fatigue Chemotherapy-induced anemia Chemotherapy-induced leukopenia: We are waiting for today's ANC to decide on the next treatment dose.  Breast pain: Related to trauma on the right breast by running into a open car door.  She is currently on antibiotics because there is slight concern for infection but she is not having any fevers.  She is currently on Bactrim.  Return to clinic in 1 week for cycle 2

## 2021-12-10 NOTE — Progress Notes (Signed)
Patient is a pleasant 56 year old female with PMH of left-sided breast cancer s/p bilateral mastectomy with immediate reconstruction using tissue expander and Flex HD performed 10/29/2021 Dr. Marla Roe presents to clinic for postoperative follow-up.    She was last seen here in clinic on 12/07/2021.  At that time, she and her husband reported mild improvement in terms of her redness, but states that it is still swollen and particularly painful.  She told me that she had hit it with a car door while rushing to her chemotherapy appointment 12/03/2021 and it provoked her redness and swelling.  She denied any fevers and otherwise felt okay.  On exam, area was tender and the erythema was limited to the inferior aspect of right breast expander site placement.  Plan was for extension of her Bactrim antibiotics to a 14-day course and close outpatient follow-up.  She understood that if it fails to improve with medical management she may require expander removal and washout.  Plan was for her to follow-up in 4 days and to also see Dr. Marla Roe at subsequent encounter.  Today, patient is doing okay.  She is accompanied by her husband at bedside.  She states that her pain and tenderness over the inferior aspect of right breast has improved considerably, but reports that it is still swollen and red.  She is on day 7 of her Bactrim.  She denies any fevers, weakness, spreading erythema, drainage, or other symptoms.  She does feel as though the area is itchy at times for which she takes an antihistamine.  She tells me that she recently did an injection for her migraine headaches which has resulted in considerable bruising and she tells me that she is a perpetually poor/slow healer.  She suspect that this is simply residual bruising from the car door injury rather than infection.  On exam, right breast still appears to be more swollen than the left breast.  There is appreciable swelling over inferior aspect with ongoing  erythema, no significant improvement or change since last encounter.  No wound or dehiscence noted.  No drainage on exam.  The skin blanches with palpation.  Tenderness has improved considerably since last encounter.    Given suspicion for underlying seroma versus hematoma, aspiration was performed over the port site with careful attention to avoid injury to the expander.  Patient was placed in supine position and 30 cc clearish serosanguineous drainage was removed.  Continue with Bactrim.  Follow-up in 1 week, sooner if needed.  Plan is to hopefully begin light expansion the week following her next visit.  Picture(s) obtained of the patient and placed in the chart were with the patient's or guardian's permission.  Patient's vital signs for today's encounter are as follows: BP 104/70 (BP Location: Right Arm, Patient Position: Sitting, Cuff Size: Normal)   Pulse 96   SpO2 99%

## 2021-12-11 ENCOUNTER — Encounter: Payer: Self-pay | Admitting: Physician Assistant

## 2021-12-11 ENCOUNTER — Ambulatory Visit (INDEPENDENT_AMBULATORY_CARE_PROVIDER_SITE_OTHER): Payer: Commercial Managed Care - HMO | Admitting: Physician Assistant

## 2021-12-11 VITALS — BP 104/70 | HR 96

## 2021-12-11 DIAGNOSIS — Z9889 Other specified postprocedural states: Secondary | ICD-10-CM

## 2021-12-13 NOTE — Progress Notes (Incomplete)
Patient Care Team: Pcp, No as PCP - General [name], RN as Oncology Nurse Navigator Mauro Kaufmann, RN as Oncology Nurse Navigator [name], MD as Consulting Physician (General Surgery) Nicholas Lose, MD as Consulting Physician (Hematology and Oncology) Kyung Rudd, MD as Consulting Physician (Radiation Oncology)  DIAGNOSIS: No diagnosis found.  SUMMARY OF ONCOLOGIC HISTORY: Oncology History  Malignant neoplasm of lower-outer quadrant of left breast of female, estrogen receptor negative ([place])  09/07/2021 Initial Diagnosis   Screening mammogram detected left breast asymmetry posteriorly.  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, Ki-67 70%   09/14/2021 Cancer Staging   Staging form: Breast, AJCC 8th Edition - Clinical stage from 09/14/2021: Stage IB (cT1c, cN0, cM0, G3, ER-, PR-, HER2-) - Signed by Hayden Pedro, PA-C on 09/14/2021 Stage prefix: Initial diagnosis Method of lymph node assessment: Clinical Histologic grading system: 3 grade system   09/26/2021 Genetic Testing   Pathogenic variant in BRCA2 at p.P6195* (c.3922G>T).  Report date is September 26, 2021 ([place]) and September 29, 2021 (expanded).    The CancerNext-Expanded gene panel offered by [facility] and includes sequencing, rearrangement, and RNA analysis for the following 77 genes: AIP, ALK, APC, ATM, AXIN2, BAP1, BARD1, BLM, BMPR1A, BRCA1, BRCA2, BRIP1, CDC73, CDH1, CDK4, CDKN1B, CDKN2A, CHEK2, CTNNA1, DICER1, FANCC, FH, FLCN, GALNT12, KIF1B, LZTR1, MAX, MEN1, MET, MLH1, MSH2, MSH3, MSH6, MUTYH, NBN, NF1, NF2, NTHL1, PALB2, PHOX2B, PMS2, POT1, PRKAR1A, PTCH1, PTEN, RAD51C, RAD51D, RB1, RECQL, RET, SDHA, SDHAF2, SDHB, SDHC, SDHD, SMAD4, SMARCA4, SMARCB1, SMARCE1, STK11, SUFU, TMEM127, TP53, TSC1, TSC2, VHL and XRCC2 (sequencing and deletion/duplication); EGFR, EGLN1, HOXB13, KIT, MITF, PDGFRA, POLD1, and POLE  (sequencing only); EPCAM and GREM1 (deletion/duplication only).    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  MEDICATIONS:  Current Outpatient Medications  Medication Sig Dispense Refill   cyclobenzaprine (FLEXERIL) 5 MG tablet Take 5 mg by mouth 3 (three) times daily as needed for muscle spasms.     dexamethasone (DECADRON) 4 MG tablet Take 4 mg by mouth 2 (two) times daily with a meal.     diazepam (VALIUM) 2 MG tablet Take 1 tablet (2 mg total) by mouth every 12 (twelve) hours as needed for up to 20 doses for muscle spasms. 20 tablet 0   Galcanezumab-gnlm (EMGALITY) 120 MG/ML SOAJ Inject 120 mg into the skin every 28 (twenty-eight) days. 3 mL 1   lidocaine-prilocaine (EMLA) cream Apply to affected area once 30 g 3   nortriptyline (PAMELOR) 10 MG capsule Take 1 capsule (10 mg total) by mouth at bedtime. 30 capsule 5   ondansetron (ZOFRAN) 4 MG tablet Take 1 tablet (4 mg total) by mouth every 8 (eight) hours as needed for up to 20 doses for nausea or vomiting. 20 tablet 0   ondansetron (ZOFRAN) 8 MG tablet Take 1 tab (8 mg) by mouth every 8 hrs as needed for nausea/vomiting. Start third day after doxorubicin/cyclophosphamide chemotherapy. 30 tablet 1   oxyCODONE (ROXICODONE) 5 MG  immediate release tablet Take 1 tablet (5 mg total) by mouth every 8 (eight) hours as needed for up to 20 doses for severe pain. 20 tablet 0   oxyCODONE (ROXICODONE) 5 MG immediate release tablet Take 1 tablet (5 mg total) by mouth every 6 (six) hours as needed for up to 7 days for severe pain. 20 tablet 0    prochlorperazine (COMPAZINE) 10 MG tablet Take 1 tablet (10 mg total) by mouth every 6 (six) hours as needed for nausea or vomiting. 30 tablet 1   sulfamethoxazole-trimethoprim (BACTRIM DS) 800-160 MG tablet Take 1 tablet by mouth 2 (two) times daily for 9 days. 18 tablet 0   SUMAtriptan (IMITREX) 50 MG tablet Take 1 tablet (50 mg total) by mouth as needed for migraine (May repeat after 2 hours.  Maximum 2 tablets in 24 hours). May repeat in 2 hours if headache persists or recurs. 30 tablet 1   Ubrogepant (UBRELVY) 100 MG TABS TAKE AS NEEDED (TAKE 1 AT THE EARLIST ONSET OF A MIGRAINE. MAY REPEAT IN 2 HOURS. MAX 2/24 HOURS). 30 tablet 1   Zavegepant HCl (ZAVZPRET) 10 MG/ACT SOLN Place 10 mg into the nose daily as needed. 6 each 11   No current facility-administered medications for this visit.    PHYSICAL EXAMINATION: ECOG PERFORMANCE STATUS: {CHL ONC ECOG [id number]}  There were no vitals filed for this visit. There were no vitals filed for this visit.  BREAST:*** No palpable masses or nodules in either right or left breasts. No palpable axillary supraclavicular or infraclavicular adenopathy no breast tenderness or nipple discharge. (exam performed in the presence of a chaperone)  LABORATORY DATA:  I have reviewed the data as listed    Latest Ref Rng & Units 12/10/2021   10:50 AM 12/03/2021   12:12 PM 09/16/2021   12:26 PM  CMP  Glucose 70 - 99 mg/dL 100  95  91   BUN 6 - 20 mg/dL _0 Creatinine 0.44 - 1.00 mg/dL 0.63  0.56  0.83   Sodium 135 - 145 mmol/L 136  138  140   Potassium 3.5 - 5.1 mmol/L 4.1  3.4  4.1   Chloride 98 - 111 mmol/L 104  103  106   CO2 22 - 32 mmol/L _1 Calcium 8.9 - 10.3 mg/dL 9.1  9.4  9.3   Total Protein 6.5 - 8.1 g/dL 6.4  7.3  7.1   Total Bilirubin 0.3 - 1.2 mg/dL 0.2  0.4  0.2   Alkaline Phos 38 - 126 U/L 66  61  65   AST 15 - 41 U/L 8  15  43   ALT 0 - 44 U/L 5  9  37     Lab Results  Component Value Date   WBC 1.4 (L) 12/10/2021    HGB 10.9 (L) 12/10/2021   HCT 32.5 (L) 12/10/2021   MCV 93.4 12/10/2021   PLT 143 (L) 12/10/2021   NEUTROABS 0.1 (LL) 12/10/2021    ASSESSMENT & PLAN:  No problem-specific Assessment & Plan notes found for this encounter.    No orders of the defined types were placed in this encounter.  The patient has a good understanding of the overall plan. she agrees with it. she will call with any problems that may develop before the next visit here. Total time spent: 30 mins including face to face time and time spent for planning, charting and co-ordination of care  Suzzette Righter, CMA 12/13/21    I Gardiner Coins am scribing for Dr. Lindi Adie  ***

## 2021-12-15 NOTE — Progress Notes (Signed)
Pt's insurance requires her to receive her udenyca injections at home starting 01/02/22 and 12/16. Udenyca removed from tx plan.  Larene Beach, PharmD

## 2021-12-16 ENCOUNTER — Ambulatory Visit (INDEPENDENT_AMBULATORY_CARE_PROVIDER_SITE_OTHER): Payer: Commercial Managed Care - HMO | Admitting: Physician Assistant

## 2021-12-16 ENCOUNTER — Encounter: Payer: Self-pay | Admitting: Physician Assistant

## 2021-12-16 ENCOUNTER — Telehealth: Payer: Self-pay

## 2021-12-16 ENCOUNTER — Other Ambulatory Visit: Payer: Self-pay

## 2021-12-16 ENCOUNTER — Encounter (HOSPITAL_BASED_OUTPATIENT_CLINIC_OR_DEPARTMENT_OTHER): Payer: Self-pay | Admitting: Plastic Surgery

## 2021-12-16 VITALS — BP 109/75 | HR 100 | Temp 97.8°F

## 2021-12-16 DIAGNOSIS — Z9889 Other specified postprocedural states: Secondary | ICD-10-CM

## 2021-12-16 MED FILL — Fosaprepitant Dimeglumine For IV Infusion 150 MG (Base Eq): INTRAVENOUS | Qty: 5 | Status: AC

## 2021-12-16 MED FILL — Dexamethasone Sodium Phosphate Inj 100 MG/10ML: INTRAMUSCULAR | Qty: 1 | Status: AC

## 2021-12-16 NOTE — Progress Notes (Signed)
Julie Townsend is a pleasant 56 year old female with PMH of left-sided breast cancer s/p bilateral mastectomy with immediate reconstruction using tissue expander and Flex HD performed 10/29/2021 Julie Townsend presents to clinic for postoperative follow-up.   She was last seen here in office on 12/11/2021.  At that time, she had recently been started on Bactrim for right expander site redness and swelling that developed subsequent to trauma.  Given suspicion for underlying infected seroma versus hematoma, aspiration was performed over the port site yielding 30 cc clearish serosanguineous drainage.  Plan is for continued Bactrim and follow-up in 1 week.  However, she called the office and asked about coming in early due to worsening redness and swelling.  Julie Townsend tells me that since the aspiration in the office, her swelling and redness has returned.  She even feels as though the redness is spread medially towards the sternum which is new.  Julie Townsend tells me that the skin has been flaking off as if she had a bad sunburn.  Julie Townsend reports that she is in considerable amount of discomfort and, while temporized by the last aspiration in office, has been persistent and worse the past few days.  She denies any fevers at home.  She tells me that she has plans for chemotherapy tomorrow with her oncologist, Julie Townsend.  On physical exam, the right side expander site placement is much more swollen than the contralateral side.  Erythema over inferior aspect of right expander site placement that is warm to the touch and with peeling, consistent with cellulitis.  Difficult to appreciate any focal pockets of fluid, but seroma suspected.  There is new erythema medially over the edge of the expander.  No induration.  Aspirated 30 cc of cloudy serosanguineous drainage.  No overt purulence or malodor.  Discussed possibility of expander removal and washout +/- replacement.  Julie Townsend states that she would be okay with the expander  replacement or with simply closing the incision and allowing it to heal with delayed reconstruction down the road.  Discussed case with Julie Townsend who reviewed photos obtained in clinic today.  Plan will be to take Julie Townsend back to the OR for expander removal and washout with possible replacement.  We will attempt to have Julie Townsend scheduled for tomorrow.  Julie Townsend will communicate these plans with Julie Townsend's oncologist, Julie Townsend.

## 2021-12-16 NOTE — Telephone Encounter (Signed)
Pt left voicemail for [place], [place] c/o increased swelling outside of marker on breast. She stated that it is getting worse. Will forward to [place] per his request and he will contact patient.

## 2021-12-16 NOTE — Telephone Encounter (Signed)
Patient to come in at 1 PM to see me

## 2021-12-16 NOTE — H&P (View-Only) (Signed)
Patient is a pleasant 56 year old female with PMH of left-sided breast cancer s/p bilateral mastectomy with immediate reconstruction using tissue expander and Flex HD performed 10/29/2021 Dr. Marla Roe presents to clinic for postoperative follow-up.   She was last seen here in office on 12/11/2021.  At that time, she had recently been started on Bactrim for right expander site redness and swelling that developed subsequent to trauma.  Given suspicion for underlying infected seroma versus hematoma, aspiration was performed over the port site yielding 30 cc clearish serosanguineous drainage.  Plan is for continued Bactrim and follow-up in 1 week.  However, she called the office and asked about coming in early due to worsening redness and swelling.  Patient tells me that since the aspiration in the office, her swelling and redness has returned.  She even feels as though the redness is spread medially towards the sternum which is new.  Patient tells me that the skin has been flaking off as if she had a bad sunburn.  Patient reports that she is in considerable amount of discomfort and, while temporized by the last aspiration in office, has been persistent and worse the past few days.  She denies any fevers at home.  She tells me that she has plans for chemotherapy tomorrow with her oncologist, Dr. Lindi Adie.  On physical exam, the right side expander site placement is much more swollen than the contralateral side.  Erythema over inferior aspect of right expander site placement that is warm to the touch and with peeling, consistent with cellulitis.  Difficult to appreciate any focal pockets of fluid, but seroma suspected.  There is new erythema medially over the edge of the expander.  No induration.  Aspirated 30 cc of cloudy serosanguineous drainage.  No overt purulence or malodor.  Discussed possibility of expander removal and washout +/- replacement.  Patient states that she would be okay with the expander  replacement or with simply closing the incision and allowing it to heal with delayed reconstruction down the road.  Discussed case with Dr. Marla Roe who reviewed photos obtained in clinic today.  Plan will be to take patient back to the OR for expander removal and washout with possible replacement.  We will attempt to have patient scheduled for tomorrow.  Dr. Marla Roe will communicate these plans with patient's oncologist, Dr. Lindi Adie.

## 2021-12-17 ENCOUNTER — Telehealth: Payer: Self-pay | Admitting: *Deleted

## 2021-12-17 ENCOUNTER — Other Ambulatory Visit: Payer: Self-pay

## 2021-12-17 ENCOUNTER — Encounter (HOSPITAL_BASED_OUTPATIENT_CLINIC_OR_DEPARTMENT_OTHER): Payer: Self-pay | Admitting: Plastic Surgery

## 2021-12-17 ENCOUNTER — Other Ambulatory Visit: Payer: Commercial Managed Care - HMO

## 2021-12-17 ENCOUNTER — Inpatient Hospital Stay: Payer: Commercial Managed Care - HMO | Admitting: Hematology and Oncology

## 2021-12-17 ENCOUNTER — Encounter (HOSPITAL_BASED_OUTPATIENT_CLINIC_OR_DEPARTMENT_OTHER)
Admission: RE | Disposition: A | Discharge status: Home / Self Care | Payer: Self-pay | Source: Home / Self Care | Attending: Plastic Surgery

## 2021-12-17 ENCOUNTER — Ambulatory Visit (HOSPITAL_BASED_OUTPATIENT_CLINIC_OR_DEPARTMENT_OTHER)
Admission: RE | Admit: 2021-12-17 | Discharge: 2021-12-17 | Disposition: A | Discharge status: Home / Self Care | Payer: Commercial Managed Care - HMO | Attending: Plastic Surgery | Admitting: Plastic Surgery

## 2021-12-17 ENCOUNTER — Inpatient Hospital Stay: Payer: Commercial Managed Care - HMO

## 2021-12-17 ENCOUNTER — Ambulatory Visit (HOSPITAL_BASED_OUTPATIENT_CLINIC_OR_DEPARTMENT_OTHER): Payer: Commercial Managed Care - HMO | Admitting: Certified Registered"

## 2021-12-17 DIAGNOSIS — S2001XA Contusion of right breast, initial encounter: Secondary | ICD-10-CM | POA: Insufficient documentation

## 2021-12-17 DIAGNOSIS — R519 Headache, unspecified: Secondary | ICD-10-CM | POA: Insufficient documentation

## 2021-12-17 DIAGNOSIS — L7682 Other postprocedural complications of skin and subcutaneous tissue: Secondary | ICD-10-CM | POA: Diagnosis not present

## 2021-12-17 DIAGNOSIS — L7634 Postprocedural seroma of skin and subcutaneous tissue following other procedure: Secondary | ICD-10-CM | POA: Diagnosis not present

## 2021-12-17 DIAGNOSIS — W2209XA Striking against other stationary object, initial encounter: Secondary | ICD-10-CM | POA: Insufficient documentation

## 2021-12-17 DIAGNOSIS — W228XXA Striking against or struck by other objects, initial encounter: Secondary | ICD-10-CM

## 2021-12-17 DIAGNOSIS — M1990 Unspecified osteoarthritis, unspecified site: Secondary | ICD-10-CM | POA: Diagnosis not present

## 2021-12-17 DIAGNOSIS — Z9013 Acquired absence of bilateral breasts and nipples: Secondary | ICD-10-CM | POA: Diagnosis not present

## 2021-12-17 DIAGNOSIS — Z01818 Encounter for other preprocedural examination: Secondary | ICD-10-CM

## 2021-12-17 HISTORY — PX: BREAST IMPLANT REMOVAL: SHX5361

## 2021-12-17 SURGERY — REMOVAL, IMPLANT, BREAST
Anesthesia: General | Site: Breast | Laterality: Right

## 2021-12-17 MED ORDER — ACETAMINOPHEN 325 MG RE SUPP: 650.0000 mg | RECTAL | Status: DC | PRN

## 2021-12-17 MED ORDER — EPHEDRINE 5 MG/ML INJ
INTRAVENOUS | Status: AC
  Filled 2021-12-17: qty 5

## 2021-12-17 MED ORDER — FENTANYL CITRATE (PF) 100 MCG/2ML IJ SOLN
INTRAMUSCULAR | Status: DC | PRN
  Administered 2021-12-17: 50 ug via INTRAVENOUS

## 2021-12-17 MED ORDER — ACETAMINOPHEN 325 MG PO TABS: 325.0000 mg | ORAL_TABLET | ORAL | Status: DC | PRN

## 2021-12-17 MED ORDER — ONDANSETRON HCL 4 MG/2ML IJ SOLN: 4.0000 mg | Freq: Once | INTRAMUSCULAR | Status: DC | PRN

## 2021-12-17 MED ORDER — FENTANYL CITRATE (PF) 100 MCG/2ML IJ SOLN: 25.0000 ug | INTRAMUSCULAR | Status: DC | PRN

## 2021-12-17 MED ORDER — ACETAMINOPHEN 500 MG PO TABS
1000.0000 mg | ORAL_TABLET | Freq: Once | ORAL | Status: AC
  Administered 2021-12-17: 1000 mg via ORAL

## 2021-12-17 MED ORDER — BUPIVACAINE HCL 0.5 % IJ SOLN
INTRAMUSCULAR | Status: DC | PRN
  Administered 2021-12-17: 10 mL

## 2021-12-17 MED ORDER — CELECOXIB 200 MG PO CAPS
200.0000 mg | ORAL_CAPSULE | Freq: Once | ORAL | Status: AC
  Administered 2021-12-17: 200 mg via ORAL

## 2021-12-17 MED ORDER — LACTATED RINGERS IV SOLN: INTRAVENOUS | Status: DC

## 2021-12-17 MED ORDER — DEXAMETHASONE SODIUM PHOSPHATE 4 MG/ML IJ SOLN
INTRAMUSCULAR | Status: DC | PRN
  Administered 2021-12-17: 5 mg via INTRAVENOUS

## 2021-12-17 MED ORDER — SODIUM CHLORIDE 0.9% FLUSH: 3.0000 mL | Freq: Two times a day (BID) | INTRAVENOUS | Status: DC

## 2021-12-17 MED ORDER — ACETAMINOPHEN 160 MG/5ML PO SOLN: 325.0000 mg | ORAL | Status: DC | PRN

## 2021-12-17 MED ORDER — OXYCODONE HCL 5 MG PO TABS
ORAL_TABLET | ORAL | Status: AC
  Filled 2021-12-17: qty 1

## 2021-12-17 MED ORDER — OXYCODONE HCL 5 MG/5ML PO SOLN: 5.0000 mg | Freq: Once | ORAL | Status: AC | PRN

## 2021-12-17 MED ORDER — BUPIVACAINE LIPOSOME 1.3 % IJ SUSP
INTRAMUSCULAR | Status: DC | PRN
  Administered 2021-12-17: 10 mL

## 2021-12-17 MED ORDER — SODIUM CHLORIDE 0.9% FLUSH: 3.0000 mL | INTRAVENOUS | Status: DC | PRN

## 2021-12-17 MED ORDER — PROPOFOL 10 MG/ML IV BOLUS
INTRAVENOUS | Status: DC | PRN
  Administered 2021-12-17: 200 mg via INTRAVENOUS

## 2021-12-17 MED ORDER — FENTANYL CITRATE (PF) 100 MCG/2ML IJ SOLN
INTRAMUSCULAR | Status: AC
  Filled 2021-12-17: qty 2

## 2021-12-17 MED ORDER — MIDAZOLAM HCL 5 MG/5ML IJ SOLN
INTRAMUSCULAR | Status: DC | PRN
  Administered 2021-12-17: 2 mg via INTRAVENOUS

## 2021-12-17 MED ORDER — DEXAMETHASONE SODIUM PHOSPHATE 10 MG/ML IJ SOLN
INTRAMUSCULAR | Status: AC
  Filled 2021-12-17: qty 1

## 2021-12-17 MED ORDER — CELECOXIB 200 MG PO CAPS
ORAL_CAPSULE | ORAL | Status: AC
  Filled 2021-12-17: qty 1

## 2021-12-17 MED ORDER — ATROPINE SULFATE 0.4 MG/ML IV SOLN
INTRAVENOUS | Status: AC
  Filled 2021-12-17: qty 1

## 2021-12-17 MED ORDER — FENTANYL CITRATE (PF) 100 MCG/2ML IJ SOLN
25.0000 ug | INTRAMUSCULAR | Status: DC | PRN
  Administered 2021-12-17: 50 ug via INTRAVENOUS

## 2021-12-17 MED ORDER — PHENYLEPHRINE HCL (PRESSORS) 10 MG/ML IV SOLN
INTRAVENOUS | Status: DC | PRN
  Administered 2021-12-17 (×2): 40 ug via INTRAVENOUS

## 2021-12-17 MED ORDER — ONDANSETRON HCL 4 MG/2ML IJ SOLN
INTRAMUSCULAR | Status: AC
  Filled 2021-12-17: qty 2

## 2021-12-17 MED ORDER — LIDOCAINE HCL (CARDIAC) PF 100 MG/5ML IV SOSY
PREFILLED_SYRINGE | INTRAVENOUS | Status: DC | PRN
  Administered 2021-12-17: 60 mg via INTRAVENOUS

## 2021-12-17 MED ORDER — ACETAMINOPHEN 325 MG PO TABS: 650.0000 mg | ORAL_TABLET | ORAL | Status: DC | PRN

## 2021-12-17 MED ORDER — OXYCODONE HCL 5 MG PO TABS: 5.0000 mg | ORAL_TABLET | ORAL | Status: DC | PRN

## 2021-12-17 MED ORDER — OXYCODONE HCL 5 MG PO TABS
5.0000 mg | ORAL_TABLET | Freq: Once | ORAL | Status: AC | PRN
  Administered 2021-12-17: 5 mg via ORAL

## 2021-12-17 MED ORDER — SODIUM CHLORIDE 0.9 % IV SOLN
INTRAVENOUS | Status: AC
  Filled 2021-12-17: qty 10

## 2021-12-17 MED ORDER — ONDANSETRON HCL 4 MG/2ML IJ SOLN
INTRAMUSCULAR | Status: DC | PRN
  Administered 2021-12-17: 4 mg via INTRAVENOUS

## 2021-12-17 MED ORDER — LIDOCAINE 2% (20 MG/ML) 5 ML SYRINGE
INTRAMUSCULAR | Status: AC
  Filled 2021-12-17: qty 5

## 2021-12-17 MED ORDER — MEPERIDINE HCL 25 MG/ML IJ SOLN: 6.2500 mg | INTRAMUSCULAR | Status: DC | PRN

## 2021-12-17 MED ORDER — MIDAZOLAM HCL 2 MG/2ML IJ SOLN
INTRAMUSCULAR | Status: AC
  Filled 2021-12-17: qty 2

## 2021-12-17 MED ORDER — SUCCINYLCHOLINE CHLORIDE 200 MG/10ML IV SOSY
PREFILLED_SYRINGE | INTRAVENOUS | Status: AC
  Filled 2021-12-17: qty 10

## 2021-12-17 MED ORDER — ACETAMINOPHEN 500 MG PO TABS
ORAL_TABLET | ORAL | Status: AC
  Filled 2021-12-17: qty 2

## 2021-12-17 MED ORDER — CEFAZOLIN SODIUM-DEXTROSE 2-3 GM-%(50ML) IV SOLR
INTRAVENOUS | Status: DC | PRN
  Administered 2021-12-17: 2 g via INTRAVENOUS

## 2021-12-17 MED ORDER — SODIUM CHLORIDE 0.9 % IV SOLN: 250.0000 mL | INTRAVENOUS | Status: DC | PRN

## 2021-12-17 MED ORDER — PHENYLEPHRINE 80 MCG/ML (10ML) SYRINGE FOR IV PUSH (FOR BLOOD PRESSURE SUPPORT)
PREFILLED_SYRINGE | INTRAVENOUS | Status: AC
  Filled 2021-12-17: qty 10

## 2021-12-17 SURGICAL SUPPLY — 47 items
ADH SKN CLS APL DERMABOND .7 (GAUZE/BANDAGES/DRESSINGS) ×1
BAG DECANTER FOR FLEXI CONT (MISCELLANEOUS) ×1 IMPLANT
BINDER BREAST LRG (GAUZE/BANDAGES/DRESSINGS) IMPLANT
BINDER BREAST MEDIUM (GAUZE/BANDAGES/DRESSINGS) IMPLANT
BIOPATCH RED 1 DISK 7.0 (GAUZE/BANDAGES/DRESSINGS) IMPLANT
BLADE SURG 10 STRL SS (BLADE) IMPLANT
BLADE SURG 15 STRL LF DISP TIS (BLADE) ×1 IMPLANT
BLADE SURG 15 STRL SS (BLADE) ×1
CANISTER SUCT 1200ML W/VALVE (MISCELLANEOUS) ×1 IMPLANT
COVER BACK TABLE 60X90IN (DRAPES) ×1 IMPLANT
COVER MAYO STAND STRL (DRAPES) ×1 IMPLANT
DERMABOND ADVANCED .7 DNX12 (GAUZE/BANDAGES/DRESSINGS) IMPLANT
DRAIN CHANNEL 19F RND (DRAIN) IMPLANT
DRAPE LAPAROSCOPIC ABDOMINAL (DRAPES) ×1 IMPLANT
DRSG MEPILEX POST OP 4X8 (GAUZE/BANDAGES/DRESSINGS) IMPLANT
DRSG TEGADERM 2-3/8X2-3/4 SM (GAUZE/BANDAGES/DRESSINGS) IMPLANT
ELECT REM PT RETURN 9FT ADLT (ELECTROSURGICAL) ×1
ELECTRODE REM PT RTRN 9FT ADLT (ELECTROSURGICAL) ×1 IMPLANT
EVACUATOR SILICONE 100CC (DRAIN) IMPLANT
GAUZE PAD ABD 8X10 STRL (GAUZE/BANDAGES/DRESSINGS) ×1 IMPLANT
GLOVE BIO SURGEON STRL SZ 6.5 (GLOVE) ×1 IMPLANT
GOWN STRL REUS W/ TWL LRG LVL3 (GOWN DISPOSABLE) ×2 IMPLANT
GOWN STRL REUS W/TWL LRG LVL3 (GOWN DISPOSABLE) ×3
HEMOSTAT ARISTA ABSORB 3G PWDR (HEMOSTASIS) IMPLANT
NDL HYPO 25X1 1.5 SAFETY (NEEDLE) IMPLANT
NDL SAFETY ECLIP 18X1.5 (MISCELLANEOUS) IMPLANT
NEEDLE HYPO 25X1 1.5 SAFETY (NEEDLE) ×1 IMPLANT
NS IRRIG 1000ML POUR BTL (IV SOLUTION) IMPLANT
PACK BASIN DAY SURGERY FS (CUSTOM PROCEDURE TRAY) ×1 IMPLANT
PENCIL SMOKE EVACUATOR (MISCELLANEOUS) ×1 IMPLANT
PIN SAFETY STERILE (MISCELLANEOUS) IMPLANT
SLEEVE SCD COMPRESS KNEE MED (STOCKING) ×1 IMPLANT
SPONGE T-LAP 18X18 ~~LOC~~+RFID (SPONGE) ×2 IMPLANT
STRIP SUTURE WOUND CLOSURE 1/2 (MISCELLANEOUS) IMPLANT
SUT MNCRL AB 4-0 PS2 18 (SUTURE) IMPLANT
SUT MON AB 3-0 SH 27 (SUTURE) ×1
SUT MON AB 3-0 SH27 (SUTURE) ×1 IMPLANT
SUT PDS AB 3-0 SH 27 (SUTURE) IMPLANT
SWAB COLLECTION DEVICE MRSA (MISCELLANEOUS) IMPLANT
SWAB CULTURE ESWAB REG 1ML (MISCELLANEOUS) IMPLANT
SYR BULB IRRIG 60ML STRL (SYRINGE) ×1 IMPLANT
SYR CONTROL 10ML LL (SYRINGE) IMPLANT
TOWEL GREEN STERILE FF (TOWEL DISPOSABLE) ×2 IMPLANT
TRAY DSU PREP LF (CUSTOM PROCEDURE TRAY) ×1 IMPLANT
TUBE CONNECTING 20X1/4 (TUBING) ×1 IMPLANT
UNDERPAD 30X36 HEAVY ABSORB (UNDERPADS AND DIAPERS) ×2 IMPLANT
YANKAUER SUCT BULB TIP NO VENT (SUCTIONS) ×1 IMPLANT

## 2021-12-17 NOTE — H&P (Signed)
Julie Townsend is an 56 y.o. female.   Chief Complaint: right breast  HPI: The patient is a 56 yrs old female is here for treatment of her right breast.  She underwent bilateral mastectomies.  Expanders were placed.  She accidentally got hit by a car door. She now has bruising and swelling.   Past Medical History:  Diagnosis Date   BRCA2 gene mutation positive 09/26/2021   Breast cancer ([place]) 09/2021   left breast IDC   Family history of breast cancer 09/22/2021   Family history of ovarian cancer 09/22/2021   Family history of pancreatic cancer 09/22/2021   Genetic testing 09/26/2021   Pathogenic variant in BRCA2 at p.V0350* (c.3922G>T).  Report date is September 26, 2021.    The BRCAplus panel offered by [employer] and includes sequencing and deletion/duplication analysis for the following 8 genes: ATM, BRCA1, BRCA2, CDH1, CHEK2, PALB2, PTEN, and TP53.  Results of pan-cancer panel are pending.     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    correct Hands    Past Surgical History:  Procedure Laterality Date   BREAST RECONSTRUCTION WITH PLACEMENT OF TISSUE EXPANDER AND FLEX HD (ACELLULAR HYDRATED DERMIS) Bilateral 10/29/2021   Procedure: BREAST RECONSTRUCTION WITH PLACEMENT OF TISSUE EXPANDER AND FLEX HD (ACELLULAR HYDRATED DERMIS);  Surgeon: Wallace Going, DO;  Location: [facility];  Service: Plastics;  Laterality: Bilateral;   CARPAL TUNNEL RELEASE Right 06/20/2020   Procedure: CARPAL TUNNEL RELEASE;  Surgeon: [name], MD;  Location: [facility];  Service: Orthopedics;  Laterality: Right;   MASTECTOMY W/ SENTINEL NODE BIOPSY Left 10/29/2021   Procedure: LEFT MASTECTOMY WITH LEFT SENTINEL LYMPH NODE BIOPSY;  Surgeon: [name], MD;  Location: [facility];  Service: General;  Laterality: Left;   PORTACATH PLACEMENT Right 10/29/2021   Procedure: INSERTION PORT-A-CATH;  Surgeon: [name],  MD;  Location: [facility];  Service: General;  Laterality: Right;   TOTAL MASTECTOMY Right 10/29/2021   Procedure: RIGHT TOTAL MASTECTOMY;  Surgeon: [name], MD;  Location: [facility];  Service: General;  Laterality: Right;   TRIGGER FINGER RELEASE Left 05/08/2020   Procedure: LEFT LONG FINGER TRIGGER RELEASE AND LEFT RING FINGER TRIGGER RELEASE;  Surgeon: [name], MD;  Location: [facility];  Service: Orthopedics;  Laterality: Left;   trigger finger release rt hand      Family History  Problem Relation Age of Onset   Migraines Mother    Breast cancer Mother 8       recurr at  52   Migraines Maternal Aunt    Colon cancer Maternal Uncle        dx late 60s   Pancreatic cancer Maternal Uncle 31   Migraines Maternal Grandmother    Ovarian cancer Maternal Grandmother        dx 31s   Social History:  reports that she has never smoked. She has never used smokeless tobacco. She reports that she does not drink alcohol and does not use drugs.  Allergies: No Known Allergies  Medications Prior to Admission  Medication Sig Dispense Refill   cyclobenzaprine (FLEXERIL) 5 MG tablet Take 5 mg by mouth 3 (three) times daily as needed for muscle spasms.     diazepam (VALIUM) 2 MG tablet Take 1 tablet (2 mg total) by mouth every 12 (twelve) hours as needed for up to 20 doses for muscle spasms. 20 tablet 0  Galcanezumab-gnlm (EMGALITY) 120 MG/ML SOAJ Inject 120 mg into the skin every 28 (twenty-eight) days. 3 mL 1   nortriptyline (PAMELOR) 10 MG capsule Take 1 capsule (10 mg total) by mouth at bedtime. 30 capsule 5   oxyCODONE (ROXICODONE) 5 MG immediate release tablet Take 1 tablet (5 mg total) by mouth every 8 (eight) hours as needed for up to 20 doses for severe pain. 20 tablet 0   SUMAtriptan (IMITREX) 50 MG tablet Take 1 tablet (50 mg total) by mouth as needed for migraine (May repeat after 2 hours.  Maximum 2 tablets in 24 hours). May repeat  in 2 hours if headache persists or recurs. 30 tablet 1   Ubrogepant (UBRELVY) 100 MG TABS TAKE AS NEEDED (TAKE 1 AT THE EARLIST ONSET OF A MIGRAINE. MAY REPEAT IN 2 HOURS. MAX 2/24 HOURS). 30 tablet 1   dexamethasone (DECADRON) 4 MG tablet Take 4 mg by mouth 2 (two) times daily with a meal.     lidocaine-prilocaine (EMLA) cream Apply to affected area once 30 g 3   ondansetron (ZOFRAN) 4 MG tablet Take 1 tablet (4 mg total) by mouth every 8 (eight) hours as needed for up to 20 doses for nausea or vomiting. 20 tablet 0   ondansetron (ZOFRAN) 8 MG tablet Take 1 tab (8 mg) by mouth every 8 hrs as needed for nausea/vomiting. Start third day after doxorubicin/cyclophosphamide chemotherapy. 30 tablet 1   prochlorperazine (COMPAZINE) 10 MG tablet Take 1 tablet (10 mg total) by mouth every 6 (six) hours as needed for nausea or vomiting. 30 tablet 1   Zavegepant HCl (ZAVZPRET) 10 MG/ACT SOLN Place 10 mg into the nose daily as needed. 6 each 11    No results found for this or any previous visit (from the past 48 hour(s)). No results found.  Review of Systems  Constitutional: Negative.   HENT: Negative.    Eyes: Negative.   Respiratory: Negative.    Cardiovascular: Negative.   Gastrointestinal: Negative.   Genitourinary: Negative.   Musculoskeletal: Negative.   Hematological: Negative.     Blood pressure 113/86, pulse (!) 102, temperature 98.1 F (36.7 C), temperature source Oral, resp. rate 15, height _0  (1.575 m), weight 51.2 kg, SpO2 99 %. Physical Exam Constitutional:      Appearance: Normal appearance.  Cardiovascular:     Rate and Rhythm: Normal rate.     Pulses: Normal pulses.  Pulmonary:     Effort: Pulmonary effort is normal.  Skin:    Capillary Refill: Capillary refill takes less than 2 seconds.     Coloration: Skin is not jaundiced.     Findings: Bruising and lesion present.  Neurological:     Mental Status: She is alert and oriented to person, place, and time.   Psychiatric:        Mood and Affect: Mood normal.        Behavior: Behavior normal.        Thought Content: Thought content normal.      Assessment/Plan   ICD-10-CM   1. Preop testing  Z01.818 CBG per Guidelines for Diabetes Management for Patients Undergoing Surgery ([facility], [facility], and [facility] only)    CBG per protocol    CBG per Guidelines for Diabetes Management for Patients Undergoing Surgery ([facility], [facility], and [facility] only)    CBG per protocol      Plan for removal of right expander with possible replacement.    [place], DO 12/17/2021, 12:17 PM

## 2021-12-17 NOTE — Anesthesia Procedure Notes (Signed)
Procedure Name: LMA Insertion Date/Time: 12/17/2021 12:38 PM  Performed by: Willa Frater, CRNAPre-anesthesia Checklist: Patient identified, Emergency Drugs available, Suction available and Patient being monitored Patient Re-evaluated:Patient Re-evaluated prior to induction Oxygen Delivery Method: Circle system utilized Preoxygenation: Pre-oxygenation with 100% oxygen Induction Type: IV induction Ventilation: Mask ventilation without difficulty LMA: LMA inserted LMA Size: 3.0 Number of attempts: 1 Airway Equipment and Method: Bite block Placement Confirmation: positive ETCO2 Tube secured with: Tape Dental Injury: Teeth and Oropharynx as per pre-operative assessment

## 2021-12-17 NOTE — Anesthesia Postprocedure Evaluation (Signed)
Anesthesia Post Note  Patient: Julie Townsend  Procedure(s) Performed: Right breast expander removal with washout (Right: Breast)     Patient location during evaluation: PACU Anesthesia Type: General Level of consciousness: awake and alert Pain management: pain level controlled Vital Signs Assessment: post-procedure vital signs reviewed and stable Respiratory status: spontaneous breathing, nonlabored ventilation and respiratory function stable Cardiovascular status: blood pressure returned to baseline and stable Postop Assessment: no apparent nausea or vomiting Anesthetic complications: no  No notable events documented.  Last Vitals:  Vitals:   12/17/21 1415 12/17/21 1435  BP: 101/72 108/77  Pulse: 85 85  Resp: 10 12  Temp:  (!) 36.3 C  SpO2: 98% 94%    Last Pain:  Vitals:   12/17/21 1435  TempSrc:   PainSc: 2                  Maddax Palinkas,W. EDMOND

## 2021-12-17 NOTE — Telephone Encounter (Signed)
Fax confirmation where clinicals were sent with request to upgrade case to urgent status  This message was sent via [employer], a product from [employer] [email or web address]                    -------Fax Transmission Report-------  To:               Recipient at [phone] Subject:          [secure] Clinical for urgent auth request - [id number] [place] Result:           The transmission was successful. Explanation:      All Pages Ok Pages Sent:       10 Connect Time:     19 minutes, 58 seconds Transmit Time:    12/16/2021 16:26 Transfer Rate:    14400 Status Code:      0000 Retry Count:      1 Job Id:           [id number] Unique Id:        [id number] Fax Line:         65 Fax Server:       [email or web address]

## 2021-12-17 NOTE — Op Note (Signed)
DATE OF OPERATION: 12/17/2021  LOCATION: [facility]  PREOPERATIVE DIAGNOSIS: Right breast seroma and trauma  POSTOPERATIVE DIAGNOSIS: Same  PROCEDURE: Removal of right breast expander  SURGEON: Lyndee Leo Sanger Tareva Leske, DO  ASSISTANT: Roetta Sessions, PA  EBL: 10 cc  CONDITION: Stable  COMPLICATIONS: None  INDICATION: The patient, Julie Townsend, is a 56 y.o. female born on [birth year], is here for treatment of a right breast seroma and trauma after accidentally hitting a car door.  She underwent bilateral mastectomy with placement of expanders over a week ago.    PROCEDURE DETAILS:  The patient was seen prior to surgery and marked.  The IV antibiotics were given. The patient was taken to the operating room and given a general anesthetic. A standard time out was performed and all information was confirmed by those in the room. SCDs were placed.   The chest was prepped and draped.    Right: The saline was evacuated from the expander.  An incision was made at the previous incision site on the lateral aspect of the mastectomy scar.  The expander was removed.  There was some seroma noted in the deep pocket.  All of the ADM was removed.  It had not started to incorporate yet.  The pocket was irrigated with saline.  Cultures were then obtained.  Saline was then used to irrigate the pocket.  Hemostasis was achieved with pressure, Bovie and hemoblast.  A drain was then placed.  The deep layers were closed with 3-0 PDS followed by a 3-0 Monocryl.  A sterile dressing was applied. The patient was allowed to wake up and taken to recovery room in stable condition at the end of the case. The family was notified at the end of the case.   The advanced practice practitioner (APP) assisted throughout the case.  The APP was essential in retraction and counter traction when needed to make the case progress smoothly.  This retraction and assistance made it possible to see the tissue plans for  the procedure.  The assistance was needed for blood control, tissue re-approximation and assisted with closure of the incision site.

## 2021-12-17 NOTE — Interval H&P Note (Signed)
History and Physical Interval Note:  12/17/2021 12:16 PM  Julie Townsend  has presented today for surgery, with the diagnosis of Right breast infection, s/p breast recon.  The various methods of treatment have been discussed with the patient and family. After consideration of risks, benefits and other options for treatment, the patient has consented to  Procedure(s): Right breast expander removal with washout and possible replacement (Right) as a surgical intervention.  The patient's history has been reviewed, patient examined, no change in status, stable for surgery.  I have reviewed the patient's chart and labs.  Questions were answered to the patient's satisfaction.     Loel Lofty Earnstine Meinders

## 2021-12-17 NOTE — Anesthesia Preprocedure Evaluation (Signed)
Anesthesia Evaluation  Patient identified by MRN, date of birth, ID band Patient awake    Reviewed: Allergy & Precautions, NPO status , Patient's Chart, lab work & pertinent test results  Airway Mallampati: II  TM Distance: >3 FB Neck ROM: Full    Dental  (+) Teeth Intact, Dental Advisory Given   Pulmonary neg pulmonary ROS   Pulmonary exam normal breath sounds clear to auscultation       Cardiovascular negative cardio ROS Normal cardiovascular exam Rhythm:Regular Rate:Normal     Neuro/Psych  Headaches    GI/Hepatic negative GI ROS, Neg liver ROS,,,  Endo/Other  negative endocrine ROS    Renal/GU negative Renal ROS     Musculoskeletal  (+) Arthritis ,    Abdominal   Peds  Hematology negative hematology ROS (+)   Anesthesia Other Findings Day of surgery medications reviewed with the patient.  LEFT BREAST CANCER  Reproductive/Obstetrics                             Anesthesia Physical Anesthesia Plan  ASA: 2  Anesthesia Plan: General   Post-op Pain Management: Tylenol PO (pre-op)*   Induction: Intravenous  PONV Risk Score and Plan: 3 and Scopolamine patch - Pre-op, Midazolam, Dexamethasone and Ondansetron  Airway Management Planned: Oral ETT and LMA  Additional Equipment: None  Intra-op Plan:   Post-operative Plan: Extubation in OR  Informed Consent: I have reviewed the patients History and Physical, chart, labs and discussed the procedure including the risks, benefits and alternatives for the proposed anesthesia with the patient or authorized representative who has indicated his/her understanding and acceptance.     Dental advisory given  Plan Discussed with: CRNA and Anesthesiologist  Anesthesia Plan Comments:         Anesthesia Quick Evaluation

## 2021-12-17 NOTE — Discharge Instructions (Signed)
No Tylenol until after 6pm today if needed  Post Anesthesia Home Care Instructions  Activity: Get plenty of rest for the remainder of the day. A responsible individual must stay with you for 24 hours following the procedure.  For the next 24 hours, DO NOT: -Drive a car -Paediatric nurse -Drink alcoholic beverages -Take any medication unless instructed by your physician -Make any legal decisions or sign important papers.  Meals: Start with liquid foods such as gelatin or soup. Progress to regular foods as tolerated. Avoid greasy, spicy, heavy foods. If nausea and/or vomiting occur, drink only clear liquids until the nausea and/or vomiting subsides. Call your physician if vomiting continues.  Special Instructions/Symptoms: Your throat may feel dry or sore from the anesthesia or the breathing tube placed in your throat during surgery. If this causes discomfort, gargle with warm salt water. The discomfort should disappear within 24 hours.  If you had a scopolamine patch placed behind your ear for the management of post- operative nausea and/or vomiting:  1. The medication in the patch is effective for 72 hours, after which it should be removed.  Wrap patch in a tissue and discard in the trash. Wash hands thoroughly with soap and water. 2. You may remove the patch earlier than 72 hours if you experience unpleasant side effects which may include dry mouth, dizziness or visual disturbances. 3. Avoid touching the patch. Wash your hands with soap and water after contact with the patch.

## 2021-12-17 NOTE — Transfer of Care (Signed)
Immediate Anesthesia Transfer of Care Note  Patient: Julie Townsend  Procedure(s) Performed: Right breast expander removal with washout (Right: Breast)  Patient Location: PACU  Anesthesia Type:General  Level of Consciousness: awake, alert , and oriented  Airway & Oxygen Therapy: Patient Spontanous Breathing and Patient connected to face mask oxygen  Post-op Assessment: Report given to RN and Post -op Vital signs reviewed and stable  Post vital signs: Reviewed and stable  Last Vitals:  Vitals Value Taken Time  BP 111/73 12/17/21 1341  Temp    Pulse 88 12/17/21 1342  Resp 13 12/17/21 1342  SpO2 98 % 12/17/21 1342  Vitals shown include unvalidated device data.  Last Pain:  Vitals:   12/17/21 1206  TempSrc: Oral  PainSc: 10-Worst pain ever      Patients Stated Pain Goal: 6 (12/17/50 0802)  Complications: No notable events documented.

## 2021-12-18 ENCOUNTER — Encounter: Payer: Commercial Managed Care - HMO | Admitting: Physician Assistant

## 2021-12-19 ENCOUNTER — Inpatient Hospital Stay: Payer: Commercial Managed Care - HMO

## 2021-12-21 ENCOUNTER — Other Ambulatory Visit: Payer: Self-pay | Admitting: Neurology

## 2021-12-22 ENCOUNTER — Encounter (HOSPITAL_BASED_OUTPATIENT_CLINIC_OR_DEPARTMENT_OTHER): Payer: Self-pay | Admitting: Plastic Surgery

## 2021-12-22 LAB — AEROBIC/ANAEROBIC CULTURE W GRAM STAIN (SURGICAL/DEEP WOUND): Gram Stain: NONE SEEN

## 2021-12-22 NOTE — Progress Notes (Unsigned)
Patient is a pleasant 56 year old female with PMH of left-sided breast cancer s/p bilateral mastectomy with immediate reconstruction using tissue expander and Flex HD performed 10/29/2021 and subsequent right-sided infection and expander removal 12/17/2021 by Dr. Marla Roe who presents to clinic for postoperative follow-up.  Reviewed operative report.  The right side expander was removed, pocket was irrigated, cultures obtained, drain was placed, and incision was closed with 3-0 PDS deep followed by 3-0 Monocryl for the skin.  Today,

## 2021-12-23 ENCOUNTER — Telehealth: Payer: Self-pay | Admitting: *Deleted

## 2021-12-23 ENCOUNTER — Telehealth: Payer: Self-pay | Admitting: Hematology and Oncology

## 2021-12-23 ENCOUNTER — Ambulatory Visit (INDEPENDENT_AMBULATORY_CARE_PROVIDER_SITE_OTHER): Payer: Commercial Managed Care - HMO | Admitting: Physician Assistant

## 2021-12-23 VITALS — BP 135/86 | HR 91

## 2021-12-23 DIAGNOSIS — Z9889 Other specified postprocedural states: Secondary | ICD-10-CM

## 2021-12-23 MED ORDER — CLINDAMYCIN HCL 150 MG PO CAPS: 450.0000 mg | ORAL_CAPSULE | Freq: Three times a day (TID) | ORAL | 0 refills | Status: AC

## 2021-12-23 NOTE — Telephone Encounter (Signed)
Scheduled appointment per 11/22 secure chat. Treatment dates changed. Patient is aware of the changes made to her upcoming appointments.

## 2021-12-23 NOTE — Telephone Encounter (Signed)
Received call from pt stating she received Udenyca 6 mg in the mail x2 doses.  Pt states she is still needing an additional dose for tx date 12/28.  RN placed to [facility] mail order pharmacy and provided verbal orders for additional syringe be mailed to address on file per MD request.

## 2021-12-28 ENCOUNTER — Ambulatory Visit (INDEPENDENT_AMBULATORY_CARE_PROVIDER_SITE_OTHER): Payer: Commercial Managed Care - HMO | Admitting: Physician Assistant

## 2021-12-28 VITALS — BP 131/94 | HR 88

## 2021-12-28 DIAGNOSIS — Z9889 Other specified postprocedural states: Secondary | ICD-10-CM

## 2021-12-28 NOTE — Progress Notes (Signed)
Patient is a pleasant 56 year old female with PMH of left-sided breast cancer s/p bilateral mastectomy with immediate reconstruction using tissue expander and Flex HD performed 10/29/2021 and subsequent right-sided infection and expander removal 12/17/2021 by Dr. Marla Roe who presents to clinic for postoperative follow-up.   She was last seen here in clinic on 12/23/2021.  At that time, she felt as though her pain had completely resolved since the expander was removed.  She did have some drain tube insertion site discomfort, but otherwise feels excellent.  Dressings remained firmly in place.  Drain was intact and functional.  Output had been 25 to 30 cc/day, but decided to wait until she was at least 1 week postop before removing the drain.  She was prescribed clindamycin given her cultures in the OR.  Today, patient is doing well.  She states that she still has some drain tube insertion site discomfort, but otherwise is doing well.  She reports considerable improvement since prior to her expander removal.  She has been having less than 20 cc/day from her JP drain and is hopeful for removal today.  On exam, overlying dressing is removed.  There is a moderate amount of residual Dermabond as well as skin darkening along the incision, unclear if desiccation versus epidermal lysis/necrosis.  She also has a degree of residual bruising/darkening from her infection/trauma.  No seroma or hematoma noted.  No cellulitic changes.  JP drain removed without complication or difficulty.  JP drain insertion site appears healthy, no significant irritation.  Recommending Vaseline daily to her mastectomy incision followed by an ABD pad and secured with sports bra.  She understands that if there is any drainage, would be concerning for a full-thickness injury/necrosis and we would need to see her urgently in clinic.  Otherwise, suspect that the darkening will improve.  She has a similar darkening along the incision on the  left side, albeit too much lesser degree.  The flaps appear healthy on that side.  Return with 2 weeks for reevaluation and possible left sided expander injection to help mitigate risk of capsular formation.  She will call should she have any questions or concerns in interim.  Picture(s) obtained of the patient and placed in the chart were with the patient's or guardian's permission.

## 2021-12-29 NOTE — Progress Notes (Signed)
Patient Care Team: Pcp, No as PCP - General [name], RN as Oncology Nurse Navigator Mauro Kaufmann, RN as Oncology Nurse Navigator [name], MD as Consulting Physician (General Surgery) Nicholas Lose, MD as Consulting Physician (Hematology and Oncology) Kyung Rudd, MD as Consulting Physician (Radiation Oncology)  DIAGNOSIS: No diagnosis found.  SUMMARY OF ONCOLOGIC HISTORY: Oncology History  Malignant neoplasm of lower-outer quadrant of left breast of female, estrogen receptor negative ([place])  09/07/2021 Initial Diagnosis   Screening mammogram detected left breast asymmetry posteriorly.  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, Ki-67 70%   09/14/2021 Cancer Staging   Staging form: Breast, AJCC 8th Edition - Clinical stage from 09/14/2021: Stage IB (cT1c, cN0, cM0, G3, ER-, PR-, HER2-) - Signed by Hayden Pedro, PA-C on 09/14/2021 Stage prefix: Initial diagnosis Method of lymph node assessment: Clinical Histologic grading system: 3 grade system   09/26/2021 Genetic Testing   Pathogenic variant in BRCA2 at p.N9892* (c.3922G>T).  Report date is September 26, 2021 ([place]) and September 29, 2021 (expanded).    The CancerNext-Expanded gene panel offered by [facility] and includes sequencing, rearrangement, and RNA analysis for the following 77 genes: AIP, ALK, APC, ATM, AXIN2, BAP1, BARD1, BLM, BMPR1A, BRCA1, BRCA2, BRIP1, CDC73, CDH1, CDK4, CDKN1B, CDKN2A, CHEK2, CTNNA1, DICER1, FANCC, FH, FLCN, GALNT12, KIF1B, LZTR1, MAX, MEN1, MET, MLH1, MSH2, MSH3, MSH6, MUTYH, NBN, NF1, NF2, NTHL1, PALB2, PHOX2B, PMS2, POT1, PRKAR1A, PTCH1, PTEN, RAD51C, RAD51D, RB1, RECQL, RET, SDHA, SDHAF2, SDHB, SDHC, SDHD, SMAD4, SMARCA4, SMARCB1, SMARCE1, STK11, SUFU, TMEM127, TP53, TSC1, TSC2, VHL and XRCC2 (sequencing and deletion/duplication); EGFR, EGLN1, HOXB13, KIT, MITF, PDGFRA, POLD1, and POLE  (sequencing only); EPCAM and GREM1 (deletion/duplication only).    10/29/2021 Surgery   Bilateral mastectomies Right mastectomy: Benign, PASH Left mastectomy: Grade 3 IDC with DCIS 1.2 cm, margins negative, 0/5 sentinel lymph nodes negative, ER 0%, PR 0%, HER2 negative, Ki-67 70%   12/03/2021 -  Chemotherapy   Patient is on Treatment Plan : BREAST ADJUVANT DOSE DENSE AC q14d / PACLitaxel q7d       CHIEF COMPLIANT: Cycle 2 AC  INTERVAL HISTORY: Julie Townsend is a 56 y.o. female with the above-mentioned left breast cancer. She presents to the clinic for a follow-up and cycle 2 AC.    ALLERGIES:  has No Known Allergies.  MEDICATIONS:  Current Outpatient Medications  Medication Sig Dispense Refill   cyclobenzaprine (FLEXERIL) 5 MG tablet Take 5 mg by mouth 3 (three) times daily as needed for muscle spasms.     dexamethasone (DECADRON) 4 MG tablet Take 4 mg by mouth 2 (two) times daily with a meal.     diazepam (VALIUM) 2 MG tablet Take 1 tablet (2 mg total) by mouth every 12 (twelve) hours as needed for up to 20 doses for muscle spasms. 20 tablet 0   Galcanezumab-gnlm (EMGALITY) 120 MG/ML SOAJ Inject 120 mg into the skin every 28 (twenty-eight) days. 3 mL 1   lidocaine-prilocaine (EMLA) cream Apply to affected area once 30 g 3   nortriptyline (PAMELOR) 10 MG capsule Take 1 capsule (10 mg total) by mouth at bedtime. 30 capsule 5   ondansetron (ZOFRAN) 4 MG tablet Take 1 tablet (4 mg total) by mouth every 8 (eight) hours as needed for up to 20 doses for nausea or vomiting. 20 tablet 0   ondansetron (ZOFRAN) 8 MG tablet Take 1 tab (8 mg)  by mouth every 8 hrs as needed for nausea/vomiting. Start third day after doxorubicin/cyclophosphamide chemotherapy. 30 tablet 1   oxyCODONE (ROXICODONE) 5 MG immediate release tablet Take 1 tablet (5 mg total) by mouth every 8 (eight) hours as needed for up to 20 doses for severe pain. 20 tablet 0   prochlorperazine (COMPAZINE) 10 MG tablet Take 1  tablet (10 mg total) by mouth every 6 (six) hours as needed for nausea or vomiting. 30 tablet 1   SUMAtriptan (IMITREX) 50 MG tablet Take 1 tablet (50 mg total) by mouth as needed for migraine (May repeat after 2 hours.  Maximum 2 tablets in 24 hours). May repeat in 2 hours if headache persists or recurs. 30 tablet 1   UBRELVY 100 MG TABS TAKE AS NEEDED (TAKE 1 AT THE EARLIST ONSET OF A MIGRAINE. MAY REPEAT IN 2 HOURS. MAX 2/24 HOURS). 30 tablet 1   Zavegepant HCl (ZAVZPRET) 10 MG/ACT SOLN Place 10 mg into the nose daily as needed. 6 each 11   No current facility-administered medications for this visit.    PHYSICAL EXAMINATION: ECOG PERFORMANCE STATUS: {CHL ONC ECOG PS:[phone]}  There were no vitals filed for this visit. There were no vitals filed for this visit.  BREAST:*** No palpable masses or nodules in either right or left breasts. No palpable axillary supraclavicular or infraclavicular adenopathy no breast tenderness or nipple discharge. (exam performed in the presence of a chaperone)  LABORATORY DATA:  I have reviewed the data as listed    Latest Ref Rng & Units 12/10/2021   10:50 AM 12/03/2021   12:12 PM 09/16/2021   12:26 PM  CMP  Glucose 70 - 99 mg/dL 100  95  91   BUN 6 - 20 mg/dL _0 Creatinine 0.44 - 1.00 mg/dL 0.63  0.56  0.83   Sodium 135 - 145 mmol/L 136  138  140   Potassium 3.5 - 5.1 mmol/L 4.1  3.4  4.1   Chloride 98 - 111 mmol/L 104  103  106   CO2 22 - 32 mmol/L _1 Calcium 8.9 - 10.3 mg/dL 9.1  9.4  9.3   Total Protein 6.5 - 8.1 g/dL 6.4  7.3  7.1   Total Bilirubin 0.3 - 1.2 mg/dL 0.2  0.4  0.2   Alkaline Phos 38 - 126 U/L 66  61  65   AST 15 - 41 U/L 8  15  43   ALT 0 - 44 U/L 5  9  37     Lab Results  Component Value Date   WBC 1.4 (L) 12/10/2021   HGB 10.9 (L) 12/10/2021   HCT 32.5 (L) 12/10/2021   MCV 93.4 12/10/2021   PLT 143 (L) 12/10/2021   NEUTROABS 0.1 (LL) 12/10/2021    ASSESSMENT & PLAN:  No problem-specific Assessment  & Plan notes found for this encounter.    No orders of the defined types were placed in this encounter.  The patient has a good understanding of the overall plan. she agrees with it. she will call with any problems that may develop before the next visit here. Total time spent: 30 mins including face to face time and time spent for planning, charting and co-ordination of care   Suzzette Righter, [place] 12/29/21    I Gardiner Coins am scribing for Dr. Lindi Adie  ***

## 2021-12-30 MED FILL — Fosaprepitant Dimeglumine For IV Infusion 150 MG (Base Eq): INTRAVENOUS | Qty: 5 | Status: AC

## 2021-12-30 MED FILL — Dexamethasone Sodium Phosphate Inj 100 MG/10ML: INTRAMUSCULAR | Qty: 1 | Status: AC

## 2021-12-31 ENCOUNTER — Inpatient Hospital Stay (HOSPITAL_BASED_OUTPATIENT_CLINIC_OR_DEPARTMENT_OTHER): Payer: Commercial Managed Care - HMO | Admitting: Hematology and Oncology

## 2021-12-31 ENCOUNTER — Inpatient Hospital Stay: Payer: Commercial Managed Care - HMO

## 2021-12-31 ENCOUNTER — Other Ambulatory Visit: Payer: Self-pay

## 2021-12-31 ENCOUNTER — Encounter: Payer: Self-pay | Admitting: *Deleted

## 2021-12-31 VITALS — BP 140/89 | HR 90 | Temp 97.3°F | Resp 18 | Ht 62.0 in | Wt 114.1 lb

## 2021-12-31 DIAGNOSIS — C50512 Malignant neoplasm of lower-outer quadrant of left female breast: Secondary | ICD-10-CM | POA: Diagnosis not present

## 2021-12-31 DIAGNOSIS — Z171 Estrogen receptor negative status [ER-]: Secondary | ICD-10-CM

## 2021-12-31 LAB — CBC WITH DIFFERENTIAL (CANCER CENTER ONLY)
Abs Immature Granulocytes: 0.02 10*3/uL (ref 0.00–0.07)
Basophils Absolute: 0.1 10*3/uL (ref 0.0–0.1)
Basophils Relative: 2 %
Eosinophils Absolute: 0.1 10*3/uL (ref 0.0–0.5)
Eosinophils Relative: 2 %
HCT: 31.1 % — ABNORMAL LOW (ref 36.0–46.0)
Hemoglobin: 10.3 g/dL — ABNORMAL LOW (ref 12.0–15.0)
Immature Granulocytes: 0 %
Lymphocytes Relative: 32 %
Lymphs Abs: 1.7 10*3/uL (ref 0.7–4.0)
MCH: 31.7 pg (ref 26.0–34.0)
MCHC: 33.1 g/dL (ref 30.0–36.0)
MCV: 95.7 fL (ref 80.0–100.0)
Monocytes Absolute: 0.9 10*3/uL (ref 0.1–1.0)
Monocytes Relative: 17 %
Neutro Abs: 2.5 10*3/uL (ref 1.7–7.7)
Neutrophils Relative %: 47 %
Platelet Count: 338 10*3/uL (ref 150–400)
RBC: 3.25 MIL/uL — ABNORMAL LOW (ref 3.87–5.11)
RDW: 14.8 % (ref 11.5–15.5)
WBC Count: 5.2 10*3/uL (ref 4.0–10.5)
nRBC: 0 % (ref 0.0–0.2)

## 2021-12-31 LAB — CMP (CANCER CENTER ONLY)
ALT: 16 U/L (ref 0–44)
AST: 16 U/L (ref 15–41)
Albumin: 4 g/dL (ref 3.5–5.0)
Alkaline Phosphatase: 61 U/L (ref 38–126)
Anion gap: 5 (ref 5–15)
BUN: 15 mg/dL (ref 6–20)
CO2: 29 mmol/L (ref 22–32)
Calcium: 9.4 mg/dL (ref 8.9–10.3)
Chloride: 106 mmol/L (ref 98–111)
Creatinine: 0.44 mg/dL (ref 0.44–1.00)
GFR, Estimated: >60 mL/min (ref >60)
Glucose, Bld: 94 mg/dL (ref 70–99)
Potassium: 3.7 mmol/L (ref 3.5–5.1)
Sodium: 140 mmol/L (ref 135–145)
Total Bilirubin: 0.5 mg/dL (ref 0.3–1.2)
Total Protein: 6.7 g/dL (ref 6.5–8.1)

## 2021-12-31 MED ORDER — SODIUM CHLORIDE 0.9 % IV SOLN
10.0000 mg | Freq: Once | INTRAVENOUS | Status: AC
  Administered 2021-12-31: 10 mg via INTRAVENOUS
  Filled 2021-12-31: qty 10
  Filled 2021-12-31: qty 1

## 2021-12-31 MED ORDER — PALONOSETRON HCL INJECTION 0.25 MG/5ML
0.2500 mg | Freq: Once | INTRAVENOUS | Status: AC
  Administered 2021-12-31: 0.25 mg via INTRAVENOUS
  Filled 2021-12-31: qty 5

## 2021-12-31 MED ORDER — SODIUM CHLORIDE 0.9 % IV SOLN
150.0000 mg | Freq: Once | INTRAVENOUS | Status: AC
  Administered 2021-12-31: 150 mg via INTRAVENOUS
  Filled 2021-12-31: qty 150
  Filled 2021-12-31: qty 5

## 2021-12-31 MED ORDER — DOXORUBICIN HCL CHEMO IV INJECTION 2 MG/ML
50.0000 mg/m2 | Freq: Once | INTRAVENOUS | Status: AC
  Administered 2021-12-31: 76 mg via INTRAVENOUS
  Filled 2021-12-31: qty 38

## 2021-12-31 MED ORDER — SODIUM CHLORIDE 0.9 % IV SOLN
500.0000 mg/m2 | Freq: Once | INTRAVENOUS | Status: AC
  Administered 2021-12-31: 760 mg via INTRAVENOUS
  Filled 2021-12-31: qty 38

## 2021-12-31 MED ORDER — SODIUM CHLORIDE 0.9% FLUSH
10.0000 mL | INTRAVENOUS | Status: DC | PRN
  Administered 2021-12-31: 10 mL

## 2021-12-31 MED ORDER — HEPARIN SOD (PORK) LOCK FLUSH 100 UNIT/ML IV SOLN
500.0000 [IU] | Freq: Once | INTRAVENOUS | Status: AC | PRN
  Administered 2021-12-31: 500 [IU]

## 2021-12-31 MED ORDER — SODIUM CHLORIDE 0.9 % IV SOLN: Freq: Once | INTRAVENOUS | Status: AC

## 2021-12-31 NOTE — Patient Instructions (Signed)
[facility] ONCOL[facility]  Discharge Instructions: Thank you for choosing [facility] to provide your oncology and hematology care.   If you have a lab appointment with the [facility], please go directly to the [facility] and check in at the registration area.   Wear comfortable clothing and clothing appropriate for easy access to any Portacath or PICC line.   We strive to give you quality time with your provider. You may need to reschedule your appointment if you arrive late (15 or more minutes).  Arriving late affects you and other patients whose appointments are after yours.  Also, if you miss three or more appointments without notifying the office, you may be dismissed from the clinic at the provider's discretion.      For prescription refill requests, have your pharmacy contact our office and allow 72 hours for refills to be completed.    Today you received the following chemotherapy and/or immunotherapy agents: Doxorubicin and Cytoxan      To help prevent nausea and vomiting after your treatment, we encourage you to take your nausea medication as directed.  BELOW ARE SYMPTOMS THAT SHOULD BE REPORTED IMMEDIATELY: *FEVER GREATER THAN 100.4 F (38 C) OR HIGHER *CHILLS OR SWEATING *NAUSEA AND VOMITING THAT IS NOT CONTROLLED WITH YOUR NAUSEA MEDICATION *UNUSUAL SHORTNESS OF BREATH *UNUSUAL BRUISING OR BLEEDING *URINARY PROBLEMS (pain or burning when urinating, or frequent urination) *BOWEL PROBLEMS (unusual diarrhea, constipation, pain near the anus) TENDERNESS IN MOUTH AND THROAT WITH OR WITHOUT PRESENCE OF ULCERS (sore throat, sores in mouth, or a toothache) UNUSUAL RASH, SWELLING OR PAIN  UNUSUAL VAGINAL DISCHARGE OR ITCHING   Items with * indicate a potential emergency and should be followed up as soon as possible or go to the Emergency Department if any problems should occur.  Please show the CHEMOTHERAPY ALERT CARD or IMMUNOTHERAPY ALERT CARD  at check-in to the Emergency Department and triage nurse.  Should you have questions after your visit or need to cancel or reschedule your appointment, please contact [facility]  Dept: [phone]  and follow the prompts.  Office hours are 8:00 a.m. to 4:30 p.m. Monday - Friday. Please note that voicemails left after 4:00 p.m. may not be returned until the following business day.  We are closed weekends and major holidays. You have access to a nurse at all times for urgent questions. Please call the main number to the clinic Dept: [phone] and follow the prompts.   For any non-urgent questions, you may also contact your provider using MyChart. We now offer e-Visits for anyone 38 and older to request care online for non-urgent symptoms. For details visit [email or web address].   Also download the MyChart app! Go to the app store, search "MyChart", open the app, select [facility], and log in with your MyChart username and password.  Masks are optional in the cancer centers. If you would like for your care team to wear a mask while they are taking care of you, please let them know. You may have one support person who is at least 56 years old accompany you for your appointments.

## 2021-12-31 NOTE — Assessment & Plan Note (Signed)
09/07/2021:Screening mammogram detected left breast asymmetry posteriorly.  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, margins negative, 0/5 sentinel lymph nodes negative, ER 0%, PR 0%, HER2 negative, Ki-67 70%   Treatment plan: Adjuvant chemotherapy with Adriamycin and Cytoxan followed by Taxol started 12/03/2021 No role of adjuvant radiation or antiestrogen therapy --------------------------------------------------------------------------------------------------------------------------------------------------------------------- Current treatment: Cycle 2 dose dense Adriamycin and Cytoxan Echocardiogram 09/22/2021: EF 60 to 65%   Chemotoxicities: Mild nausea Mild fatigue Chemotherapy-induced anemia Chemotherapy-induced leukopenia: We are waiting for today's ANC to decide on the next treatment dose.   Breast pain: Related to trauma on the right breast by running into a open car door.  She is currently on antibiotics because there is slight concern for infection but she is not having any fevers.  She is currently on Bactrim.   For cycle 3 onwards she is going to get these Neulasta injections with the home health nurse at her home.   Return to clinic in 2 weeks for cycle 3

## 2022-01-01 ENCOUNTER — Telehealth: Payer: Self-pay | Admitting: Hematology and Oncology

## 2022-01-01 NOTE — Telephone Encounter (Signed)
Rescheduled appointments per provider PAL. Patient is aware of the changes made to her upcoming appointments.

## 2022-01-02 ENCOUNTER — Inpatient Hospital Stay: Payer: Commercial Managed Care - HMO

## 2022-01-02 ENCOUNTER — Ambulatory Visit: Payer: Commercial Managed Care - HMO

## 2022-01-05 ENCOUNTER — Telehealth: Payer: Self-pay

## 2022-01-05 NOTE — Telephone Encounter (Signed)
Faxed recent notes to [facility], per their request

## 2022-01-08 ENCOUNTER — Encounter: Payer: Self-pay | Admitting: Plastic Surgery

## 2022-01-08 ENCOUNTER — Ambulatory Visit (INDEPENDENT_AMBULATORY_CARE_PROVIDER_SITE_OTHER): Payer: Commercial Managed Care - HMO | Admitting: Plastic Surgery

## 2022-01-08 VITALS — BP 133/84 | HR 95

## 2022-01-08 DIAGNOSIS — Z171 Estrogen receptor negative status [ER-]: Secondary | ICD-10-CM

## 2022-01-08 DIAGNOSIS — Z1501 Genetic susceptibility to malignant neoplasm of breast: Secondary | ICD-10-CM

## 2022-01-08 DIAGNOSIS — Z1509 Genetic susceptibility to other malignant neoplasm: Secondary | ICD-10-CM

## 2022-01-08 DIAGNOSIS — C50512 Malignant neoplasm of lower-outer quadrant of left female breast: Secondary | ICD-10-CM

## 2022-01-08 NOTE — Progress Notes (Signed)
   Subjective:    Patient ID: Julie Townsend, female    DOB: [birth year], 56 y.o.   MRN: [id number]  The patient is a 56 year old female here for further discussion about her breast.  She had left-sided breast cancer and underwent bilateral mastectomies with immediate reconstruction in September 2023.  Unfortunately she had problems with the right breast expander and had it removed in November 2023. It looks like her left expander may have 100 cc of fluid.  The skin is a little darker and a little tighter on the right side as expected.  We have encouraged her to do massage as able.  We will wait until her chemo is finished before trying to put in another expander.     Review of Systems  Constitutional: Negative.   Eyes: Negative.   Respiratory: Negative.    Cardiovascular: Negative.   Gastrointestinal: Negative.   Endocrine: Negative.   Genitourinary: Negative.   Musculoskeletal: Negative.        Objective:   Physical Exam Constitutional:      Appearance: Normal appearance.  Cardiovascular:     Rate and Rhythm: Normal rate.     Pulses: Normal pulses.  Pulmonary:     Effort: Pulmonary effort is normal.  Skin:    Capillary Refill: Capillary refill takes less than 2 seconds.     Coloration: Skin is not jaundiced.     Findings: Bruising present. No lesion.  Neurological:     Mental Status: She is alert and oriented to person, place, and time.  Psychiatric:        Mood and Affect: Mood normal.        Behavior: Behavior normal.        Thought Content: Thought content normal.        Judgment: Judgment normal.           Assessment & Plan:     ICD-10-CM   1. BRCA2 gene mutation positive  Z15.01    Z15.09     2. Malignant neoplasm of lower-outer quadrant of left breast of female, estrogen receptor negative ([facility])  C50.512    Z17.1       We placed injectable saline in the Expander using a sterile technique: Right: out Left: 50 cc for a total of 150 / 455  cc  Plan for follow-up in 2 weeks.  We are trying to balance the fills with her chemotherapy so we do not introduce infection.

## 2022-01-11 ENCOUNTER — Encounter: Payer: Commercial Managed Care - HMO | Admitting: Physician Assistant

## 2022-01-13 ENCOUNTER — Encounter: Payer: Commercial Managed Care - HMO | Admitting: Physician Assistant

## 2022-01-13 MED FILL — Dexamethasone Sodium Phosphate Inj 100 MG/10ML: INTRAMUSCULAR | Qty: 1 | Status: AC

## 2022-01-13 MED FILL — Fosaprepitant Dimeglumine For IV Infusion 150 MG (Base Eq): INTRAVENOUS | Qty: 5 | Status: AC

## 2022-01-13 NOTE — Progress Notes (Signed)
Patient Care Team: Pcp, No as PCP - General [name], RN as Oncology Nurse Navigator Mauro Kaufmann, RN as Oncology Nurse Navigator [name], MD as Consulting Physician (General Surgery) Nicholas Lose, MD as Consulting Physician (Hematology and Oncology) Kyung Rudd, MD as Consulting Physician (Radiation Oncology)  DIAGNOSIS: No diagnosis found.  SUMMARY OF ONCOLOGIC HISTORY: Oncology History  Malignant neoplasm of lower-outer quadrant of left breast of female, estrogen receptor negative ([place])  09/07/2021 Initial Diagnosis   Screening mammogram detected left breast asymmetry posteriorly.  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, Ki-67 70%   09/14/2021 Cancer Staging   Staging form: Breast, AJCC 8th Edition - Clinical stage from 09/14/2021: Stage IB (cT1c, cN0, cM0, G3, ER-, PR-, HER2-) - Signed by Hayden Pedro, PA-C on 09/14/2021 Stage prefix: Initial diagnosis Method of lymph node assessment: Clinical Histologic grading system: 3 grade system   09/26/2021 Genetic Testing   Pathogenic variant in BRCA2 at p.L8921* (c.3922G>T).  Report date is September 26, 2021 ([place]) and September 29, 2021 (expanded).    The CancerNext-Expanded gene panel offered by [facility] and includes sequencing, rearrangement, and RNA analysis for the following 77 genes: AIP, ALK, APC, ATM, AXIN2, BAP1, BARD1, BLM, BMPR1A, BRCA1, BRCA2, BRIP1, CDC73, CDH1, CDK4, CDKN1B, CDKN2A, CHEK2, CTNNA1, DICER1, FANCC, FH, FLCN, GALNT12, KIF1B, LZTR1, MAX, MEN1, MET, MLH1, MSH2, MSH3, MSH6, MUTYH, NBN, NF1, NF2, NTHL1, PALB2, PHOX2B, PMS2, POT1, PRKAR1A, PTCH1, PTEN, RAD51C, RAD51D, RB1, RECQL, RET, SDHA, SDHAF2, SDHB, SDHC, SDHD, SMAD4, SMARCA4, SMARCB1, SMARCE1, STK11, SUFU, TMEM127, TP53, TSC1, TSC2, VHL and XRCC2 (sequencing and deletion/duplication); EGFR, EGLN1, HOXB13, KIT, MITF, PDGFRA, POLD1, and POLE  (sequencing only); EPCAM and GREM1 (deletion/duplication only).    10/29/2021 Surgery   Bilateral mastectomies Right mastectomy: Benign, PASH Left mastectomy: Grade 3 IDC with DCIS 1.2 cm, margins negative, 0/5 sentinel lymph nodes negative, ER 0%, PR 0%, HER2 negative, Ki-67 70%   12/03/2021 -  Chemotherapy   Patient is on Treatment Plan : BREAST ADJUVANT DOSE DENSE AC q14d / PACLitaxel q7d       CHIEF COMPLIANT:  Cycle 3 AC   INTERVAL HISTORY: Julie Townsend is a  56 y.o. female with the above-mentioned left breast cancer. She presents to the clinic for a follow-up and cycle 3 AC.    ALLERGIES:  has No Known Allergies.  MEDICATIONS:  Current Outpatient Medications  Medication Sig Dispense Refill   cyclobenzaprine (FLEXERIL) 5 MG tablet Take 5 mg by mouth 3 (three) times daily as needed for muscle spasms.     dexamethasone (DECADRON) 4 MG tablet Take 4 mg by mouth 2 (two) times daily with a meal.     diazepam (VALIUM) 2 MG tablet Take 1 tablet (2 mg total) by mouth every 12 (twelve) hours as needed for up to 20 doses for muscle spasms. 20 tablet 0   Galcanezumab-gnlm (EMGALITY) 120 MG/ML SOAJ Inject 120 mg into the skin every 28 (twenty-eight) days. 3 mL 1   lidocaine-prilocaine (EMLA) cream Apply to affected area once 30 g 3   nortriptyline (PAMELOR) 10 MG capsule Take 1 capsule (10 mg total) by mouth at bedtime. 30 capsule 5   ondansetron (ZOFRAN) 4 MG tablet Take 1 tablet (4 mg total) by mouth every 8 (eight) hours as needed for up to 20 doses for nausea or vomiting. 20 tablet 0   ondansetron (ZOFRAN) 8 MG tablet Take 1  tab (8 mg) by mouth every 8 hrs as needed for nausea/vomiting. Start third day after doxorubicin/cyclophosphamide chemotherapy. 30 tablet 1   oxyCODONE (ROXICODONE) 5 MG immediate release tablet Take 1 tablet (5 mg total) by mouth every 8 (eight) hours as needed for up to 20 doses for severe pain. 20 tablet 0   prochlorperazine (COMPAZINE) 10 MG tablet Take 1  tablet (10 mg total) by mouth every 6 (six) hours as needed for nausea or vomiting. 30 tablet 1   SUMAtriptan (IMITREX) 50 MG tablet Take 1 tablet (50 mg total) by mouth as needed for migraine (May repeat after 2 hours.  Maximum 2 tablets in 24 hours). May repeat in 2 hours if headache persists or recurs. 30 tablet 1   UBRELVY 100 MG TABS TAKE AS NEEDED (TAKE 1 AT THE EARLIST ONSET OF A MIGRAINE. MAY REPEAT IN 2 HOURS. MAX 2/24 HOURS). 30 tablet 1   Zavegepant HCl (ZAVZPRET) 10 MG/ACT SOLN Place 10 mg into the nose daily as needed. 6 each 11   No current facility-administered medications for this visit.    PHYSICAL EXAMINATION: ECOG PERFORMANCE STATUS: {CHL ONC ECOG PS:[id number]}  There were no vitals filed for this visit. There were no vitals filed for this visit.  BREAST:*** No palpable masses or nodules in either right or left breasts. No palpable axillary supraclavicular or infraclavicular adenopathy no breast tenderness or nipple discharge. (exam performed in the presence of a chaperone)  LABORATORY DATA:  I have reviewed the data as listed    Latest Ref Rng & Units 12/31/2021   11:45 AM 12/10/2021   10:50 AM 12/03/2021   12:12 PM  CMP  Glucose 70 - 99 mg/dL 94  100  95   BUN 6 - 20 mg/dL _0 Creatinine 0.44 - 1.00 mg/dL 0.44  0.63  0.56   Sodium 135 - 145 mmol/L 140  136  138   Potassium 3.5 - 5.1 mmol/L 3.7  4.1  3.4   Chloride 98 - 111 mmol/L 106  104  103   CO2 22 - 32 mmol/L _1 Calcium 8.9 - 10.3 mg/dL 9.4  9.1  9.4   Total Protein 6.5 - 8.1 g/dL 6.7  6.4  7.3   Total Bilirubin 0.3 - 1.2 mg/dL 0.5  0.2  0.4   Alkaline Phos 38 - 126 U/L 61  66  61   AST 15 - 41 U/L _2 ALT 0 - 44 U/L _3 Lab Results  Component Value Date   WBC 5.2 12/31/2021   HGB 10.3 (L) 12/31/2021   HCT 31.1 (L) 12/31/2021   MCV 95.7 12/31/2021   PLT 338 12/31/2021   NEUTROABS 2.5 12/31/2021    ASSESSMENT & PLAN:  No problem-specific Assessment & Plan  notes found for this encounter.    No orders of the defined types were placed in this encounter.  The patient has a good understanding of the overall plan. she agrees with it. she will call with any problems that may develop before the next visit here. Total time spent: 30 mins including face to face time and time spent for planning, charting and co-ordination of care   Suzzette Righter, [place] 01/13/22    I Gardiner Coins am acting as a Education administrator for [employer]  ***

## 2022-01-14 ENCOUNTER — Inpatient Hospital Stay: Payer: Commercial Managed Care - HMO

## 2022-01-14 ENCOUNTER — Other Ambulatory Visit: Payer: Self-pay | Admitting: Hematology and Oncology

## 2022-01-14 ENCOUNTER — Inpatient Hospital Stay: Payer: Commercial Managed Care - HMO | Attending: Hematology and Oncology

## 2022-01-14 ENCOUNTER — Other Ambulatory Visit: Payer: Self-pay

## 2022-01-14 ENCOUNTER — Inpatient Hospital Stay (HOSPITAL_BASED_OUTPATIENT_CLINIC_OR_DEPARTMENT_OTHER): Payer: Commercial Managed Care - HMO | Admitting: Hematology and Oncology

## 2022-01-14 VITALS — BP 132/89 | HR 89 | Temp 97.2°F | Resp 18 | Ht 62.0 in | Wt 112.6 lb

## 2022-01-14 VITALS — BP 136/88 | HR 86 | Temp 98.0°F | Resp 16

## 2022-01-14 DIAGNOSIS — Z171 Estrogen receptor negative status [ER-]: Secondary | ICD-10-CM | POA: Insufficient documentation

## 2022-01-14 DIAGNOSIS — Z9013 Acquired absence of bilateral breasts and nipples: Secondary | ICD-10-CM | POA: Diagnosis not present

## 2022-01-14 DIAGNOSIS — T451X5A Adverse effect of antineoplastic and immunosuppressive drugs, initial encounter: Secondary | ICD-10-CM | POA: Diagnosis not present

## 2022-01-14 DIAGNOSIS — Z7952 Long term (current) use of systemic steroids: Secondary | ICD-10-CM | POA: Diagnosis not present

## 2022-01-14 DIAGNOSIS — Z9221 Personal history of antineoplastic chemotherapy: Secondary | ICD-10-CM | POA: Insufficient documentation

## 2022-01-14 DIAGNOSIS — C50512 Malignant neoplasm of lower-outer quadrant of left female breast: Secondary | ICD-10-CM | POA: Diagnosis not present

## 2022-01-14 DIAGNOSIS — Z79899 Other long term (current) drug therapy: Secondary | ICD-10-CM | POA: Insufficient documentation

## 2022-01-14 DIAGNOSIS — D6481 Anemia due to antineoplastic chemotherapy: Secondary | ICD-10-CM | POA: Diagnosis not present

## 2022-01-14 DIAGNOSIS — R110 Nausea: Secondary | ICD-10-CM | POA: Insufficient documentation

## 2022-01-14 DIAGNOSIS — Z923 Personal history of irradiation: Secondary | ICD-10-CM | POA: Insufficient documentation

## 2022-01-14 DIAGNOSIS — D72818 Other decreased white blood cell count: Secondary | ICD-10-CM | POA: Insufficient documentation

## 2022-01-14 LAB — CMP (CANCER CENTER ONLY)
ALT: 15 U/L (ref 0–44)
AST: 23 U/L (ref 15–41)
Albumin: 4.3 g/dL (ref 3.5–5.0)
Alkaline Phosphatase: 92 U/L (ref 38–126)
Anion gap: 6 (ref 5–15)
BUN: 8 mg/dL (ref 6–20)
CO2: 30 mmol/L (ref 22–32)
Calcium: 9.8 mg/dL (ref 8.9–10.3)
Chloride: 104 mmol/L (ref 98–111)
Creatinine: 0.54 mg/dL (ref 0.44–1.00)
GFR, Estimated: >60 mL/min (ref >60)
Glucose, Bld: 91 mg/dL (ref 70–99)
Potassium: 4.1 mmol/L (ref 3.5–5.1)
Sodium: 140 mmol/L (ref 135–145)
Total Bilirubin: 0.3 mg/dL (ref 0.3–1.2)
Total Protein: 6.8 g/dL (ref 6.5–8.1)

## 2022-01-14 LAB — CBC WITH DIFFERENTIAL (CANCER CENTER ONLY)
Abs Immature Granulocytes: 0.62 10*3/uL — ABNORMAL HIGH (ref 0.00–0.07)
Basophils Absolute: 0.1 10*3/uL (ref 0.0–0.1)
Basophils Relative: 1 %
Eosinophils Absolute: 0.1 10*3/uL (ref 0.0–0.5)
Eosinophils Relative: 1 %
HCT: 33.2 % — ABNORMAL LOW (ref 36.0–46.0)
Hemoglobin: 11.2 g/dL — ABNORMAL LOW (ref 12.0–15.0)
Immature Granulocytes: 7 %
Lymphocytes Relative: 20 %
Lymphs Abs: 1.7 10*3/uL (ref 0.7–4.0)
MCH: 32.2 pg (ref 26.0–34.0)
MCHC: 33.7 g/dL (ref 30.0–36.0)
MCV: 95.4 fL (ref 80.0–100.0)
Monocytes Absolute: 0.7 10*3/uL (ref 0.1–1.0)
Monocytes Relative: 9 %
Neutro Abs: 5.3 10*3/uL (ref 1.7–7.7)
Neutrophils Relative %: 62 %
Platelet Count: 248 10*3/uL (ref 150–400)
RBC: 3.48 MIL/uL — ABNORMAL LOW (ref 3.87–5.11)
RDW: 15.4 % (ref 11.5–15.5)
Smear Review: NORMAL
WBC Count: 8.5 10*3/uL (ref 4.0–10.5)
nRBC: 0.4 % — ABNORMAL HIGH (ref 0.0–0.2)

## 2022-01-14 MED ORDER — SODIUM CHLORIDE 0.9 % IV SOLN
150.0000 mg | Freq: Once | INTRAVENOUS | Status: AC
  Administered 2022-01-14: 150 mg via INTRAVENOUS
  Filled 2022-01-14: qty 150

## 2022-01-14 MED ORDER — PALONOSETRON HCL INJECTION 0.25 MG/5ML
0.2500 mg | Freq: Once | INTRAVENOUS | Status: AC
  Administered 2022-01-14: 0.25 mg via INTRAVENOUS
  Filled 2022-01-14: qty 5

## 2022-01-14 MED ORDER — HEPARIN SOD (PORK) LOCK FLUSH 100 UNIT/ML IV SOLN
500.0000 [IU] | Freq: Once | INTRAVENOUS | Status: AC | PRN
  Administered 2022-01-14: 500 [IU]

## 2022-01-14 MED ORDER — SODIUM CHLORIDE 0.9 % IV SOLN
500.0000 mg/m2 | Freq: Once | INTRAVENOUS | Status: AC
  Administered 2022-01-14: 760 mg via INTRAVENOUS
  Filled 2022-01-14: qty 38

## 2022-01-14 MED ORDER — SODIUM CHLORIDE 0.9 % IV SOLN
10.0000 mg | Freq: Once | INTRAVENOUS | Status: AC
  Administered 2022-01-14: 10 mg via INTRAVENOUS
  Filled 2022-01-14: qty 10

## 2022-01-14 MED ORDER — DOXORUBICIN HCL CHEMO IV INJECTION 2 MG/ML
50.0000 mg/m2 | Freq: Once | INTRAVENOUS | Status: AC
  Administered 2022-01-14: 76 mg via INTRAVENOUS
  Filled 2022-01-14: qty 38

## 2022-01-14 MED ORDER — SODIUM CHLORIDE 0.9% FLUSH
10.0000 mL | INTRAVENOUS | Status: DC | PRN
  Administered 2022-01-14: 10 mL

## 2022-01-14 MED ORDER — SODIUM CHLORIDE 0.9% FLUSH
10.0000 mL | INTRAVENOUS | Status: DC | PRN
  Administered 2022-01-14: 10 mL via INTRAVENOUS

## 2022-01-14 MED ORDER — SODIUM CHLORIDE 0.9 % IV SOLN: Freq: Once | INTRAVENOUS | Status: AC

## 2022-01-14 NOTE — Assessment & Plan Note (Signed)
09/07/2021:Screening mammogram detected left breast asymmetry posteriorly.  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, margins negative, 0/5 sentinel lymph nodes negative, ER 0%, PR 0%, HER2 negative, Ki-67 70%   Treatment plan: Adjuvant chemotherapy with Adriamycin and Cytoxan followed by Taxol started 12/03/2021 No role of adjuvant radiation or antiestrogen therapy --------------------------------------------------------------------------------------------------------------------------------------------------------------------- Current treatment: Cycle 3 dose dense Adriamycin and Cytoxan Echocardiogram 09/22/2021: EF 60 to 65%   Chemotoxicities: Mild nausea Mild fatigue Chemotherapy-induced anemia Chemotherapy-induced leukopenia: Reduced the dosage for cycle 2   Breast pain: Expander has been removed and the pain is improved.   For cycle 3 onwards she is going to get these Neulasta injections with the home health nurse at her home.   Return to clinic in 2 weeks for cycle 4

## 2022-01-14 NOTE — Patient Instructions (Addendum)
[facility]  Discharge Instructions: Thank you for choosing [facility] to provide your oncology and hematology care.   If you have a lab appointment with the [facility], please go directly to the [facility] and check in at the registration area.   Wear comfortable clothing and clothing appropriate for easy access to any Portacath or PICC line.   We strive to give you quality time with your provider. You may need to reschedule your appointment if you arrive late (15 or more minutes).  Arriving late affects you and other patients whose appointments are after yours.  Also, if you miss three or more appointments without notifying the office, you may be dismissed from the clinic at the provider's discretion.      For prescription refill requests, have your pharmacy contact our office and allow 72 hours for refills to be completed.    Today you received the following chemotherapy and/or immunotherapy agents: Adriamycin and Cytoxan.   To help prevent nausea and vomiting after your treatment, we encourage you to take your nausea medication as directed.  BELOW ARE SYMPTOMS THAT SHOULD BE REPORTED IMMEDIATELY: *FEVER GREATER THAN 100.4 F (38 C) OR HIGHER *CHILLS OR SWEATING *NAUSEA AND VOMITING THAT IS NOT CONTROLLED WITH YOUR NAUSEA MEDICATION *UNUSUAL SHORTNESS OF BREATH *UNUSUAL BRUISING OR BLEEDING *URINARY PROBLEMS (pain or burning when urinating, or frequent urination) *BOWEL PROBLEMS (unusual diarrhea, constipation, pain near the anus) TENDERNESS IN MOUTH AND THROAT WITH OR WITHOUT PRESENCE OF ULCERS (sore throat, sores in mouth, or a toothache) UNUSUAL RASH, SWELLING OR PAIN  UNUSUAL VAGINAL DISCHARGE OR ITCHING   Items with * indicate a potential emergency and should be followed up as soon as possible or go to the Emergency Department if any problems should occur.  Please show the CHEMOTHERAPY ALERT CARD or IMMUNOTHERAPY ALERT CARD at  check-in to the Emergency Department and triage nurse.  Should you have questions after your visit or need to cancel or reschedule your appointment, please contact [facility]  Dept: [phone]  and follow the prompts.  Office hours are 8:00 a.m. to 4:30 p.m. Monday - Friday. Please note that voicemails left after 4:00 p.m. may not be returned until the following business day.  We are closed weekends and major holidays. You have access to a nurse at all times for urgent questions. Please call the main number to the clinic Dept: [phone] and follow the prompts.   For any non-urgent questions, you may also contact your provider using MyChart. We now offer e-Visits for anyone 42 and older to request care online for non-urgent symptoms. For details visit [email or web address].   Also download the MyChart app! Go to the app store, search "MyChart", open the app, select [facility], and log in with your MyChart username and password.  Masks are optional in the cancer centers. If you would like for your care team to wear a mask while they are taking care of you, please let them know. You may have one support person who is at least 56 years old accompany you for your appointments. Doxorubicin Injection What is this medication? DOXORUBICIN (dox oh ROO bi sin) treats some types of cancer. It works by slowing down the growth of cancer cells. This medicine may be used for other purposes; ask your health care provider or pharmacist if you have questions. COMMON BRAND NAME(S): Adriamycin, Adriamycin PFS, Adriamycin RDF, Rubex What should I tell my care team before  I take this medication? They need to know if you have any of these conditions: Heart disease History of low blood cell levels caused by a medication Liver disease Recent or ongoing radiation An unusual or allergic reaction to doxorubicin, other medications, foods, dyes, or preservatives If you or your partner  are pregnant or trying to get pregnant Breast-feeding How should I use this medication? This medication is injected into a vein. It is given by your care team in a hospital or clinic setting. Talk to your care team about the use of this medication in children. Special care may be needed. Overdosage: If you think you have taken too much of this medicine contact a poison control center or emergency room at once. NOTE: This medicine is only for you. Do not share this medicine with others. What if I miss a dose? Keep appointments for follow-up doses. It is important not to miss your dose. Call your care team if you are unable to keep an appointment. What may interact with this medication? 6-mercaptopurine Paclitaxel Phenytoin St. John's wort Trastuzumab Verapamil This list may not describe all possible interactions. Give your health care provider a list of all the medicines, herbs, non-prescription drugs, or dietary supplements you use. Also tell them if you smoke, drink alcohol, or use illegal drugs. Some items may interact with your medicine. What should I watch for while using this medication? Your condition will be monitored carefully while you are receiving this medication. You may need blood work while taking this medication. This medication may make you feel generally unwell. This is not uncommon as chemotherapy can affect healthy cells as well as cancer cells. Report any side effects. Continue your course of treatment even though you feel ill unless your care team tells you to stop. There is a maximum amount of this medication you should receive throughout your life. The amount depends on the medical condition being treated and your overall health. Your care team will watch how much of this medication you receive. Tell your care team if you have taken this medication before. Your urine may turn red for a few days after your dose. This is not blood. If your urine is dark or brown, call your  care team. In some cases, you may be given additional medications to help with side effects. Follow all directions for their use. This medication may increase your risk of getting an infection. Call your care team for advice if you get a fever, chills, sore throat, or other symptoms of a cold or flu. Do not treat yourself. Try to avoid being around people who are sick. This medication may increase your risk to bruise or bleed. Call your care team if you notice any unusual bleeding. Talk to your care team about your risk of cancer. You may be more at risk for certain types of cancers if you take this medication. You should make sure that you get enough Coenzyme Q10 while you are taking this medication. Discuss the foods you eat and the vitamins you take with your care team. Talk to your care team if you or your partner may be pregnant. Serious birth defects can occur if you take this medication during pregnancy and for 6 months after the last dose. Contraception is recommended while taking this medication and for 6 months after the last dose. Your care team can help you find the option that works for you. If your partner can get pregnant, use a condom while taking this medication and  for 6 months after the last dose. Do not breastfeed while taking this medication. This medication may cause infertility. Talk to your care team if you are concerned about your fertility. What side effects may I notice from receiving this medication? Side effects that you should report to your care team as soon as possible: Allergic reactions--skin rash, itching, hives, swelling of the face, lips, tongue, or throat Heart failure--shortness of breath, swelling of the ankles, feet, or hands, sudden weight gain, unusual weakness or fatigue Heart rhythm changes--fast or irregular heartbeat, dizziness, feeling faint or lightheaded, chest pain, trouble breathing Infection--fever, chills, cough, sore throat, wounds that don't  heal, pain or trouble when passing urine, general feeling of discomfort or being unwell Low red blood cell level--unusual weakness or fatigue, dizziness, headache, trouble breathing Painful swelling, warmth, or redness of the skin, blisters or sores at the infusion site Unusual bruising or bleeding Side effects that usually do not require medical attention (report to your care team if they continue or are bothersome): Diarrhea Hair loss Nausea Pain, redness, or swelling with sores inside the mouth or throat Red urine This list may not describe all possible side effects. Call your doctor for medical advice about side effects. You may report side effects to FDA at 1-800-FDA-1088. Where should I keep my medication? This medication is given in a hospital or clinic. It will not be stored at home. NOTE: This sheet is a summary. It may not cover all possible information. If you have questions about this medicine, talk to your doctor, pharmacist, or health care provider.  2023 Elsevier/Gold Standard (2021-05-27 00:00:00) Cyclophosphamide Injection What is this medication? CYCLOPHOSPHAMIDE (sye kloe FOSS fa mide) treats some types of cancer. It works by slowing down the growth of cancer cells. This medicine may be used for other purposes; ask your health care provider or pharmacist if you have questions. COMMON BRAND NAME(S): Cyclophosphamide, Cytoxan, Neosar What should I tell my care team before I take this medication? They need to know if you have any of these conditions: Heart disease Irregular heartbeat or rhythm Infection Kidney problems Liver disease Low blood cell levels (white cells, platelets, or red blood cells) Lung disease Previous radiation Trouble passing urine An unusual or allergic reaction to cyclophosphamide, other medications, foods, dyes, or preservatives Pregnant or trying to get pregnant Breast-feeding How should I use this medication? This medication is injected  into a vein. It is given by your care team in a hospital or clinic setting. Talk to your care team about the use of this medication in children. Special care may be needed. Overdosage: If you think you have taken too much of this medicine contact a poison control center or emergency room at once. NOTE: This medicine is only for you. Do not share this medicine with others. What if I miss a dose? Keep appointments for follow-up doses. It is important not to miss your dose. Call your care team if you are unable to keep an appointment. What may interact with this medication? Amphotericin B Amiodarone Azathioprine Certain antivirals for HIV or hepatitis Certain medications for blood pressure, such as enalapril, lisinopril, quinapril Cyclosporine Diuretics Etanercept Indomethacin Medications that relax muscles Metronidazole Natalizumab Tamoxifen Warfarin This list may not describe all possible interactions. Give your health care provider a list of all the medicines, herbs, non-prescription drugs, or dietary supplements you use. Also tell them if you smoke, drink alcohol, or use illegal drugs. Some items may interact with your medicine. What should I watch  for while using this medication? This medication may make you feel generally unwell. This is not uncommon as chemotherapy can affect healthy cells as well as cancer cells. Report any side effects. Continue your course of treatment even though you feel ill unless your care team tells you to stop. You may need blood work while you are taking this medication. This medication may increase your risk of getting an infection. Call your care team for advice if you get a fever, chills, sore throat, or other symptoms of a cold or flu. Do not treat yourself. Try to avoid being around people who are sick. Avoid taking medications that contain aspirin, acetaminophen, ibuprofen, naproxen, or ketoprofen unless instructed by your care team. These medications  may hide a fever. Be careful brushing or flossing your teeth or using a toothpick because you may get an infection or bleed more easily. If you have any dental work done, tell your dentist you are receiving this medication. Drink water or other fluids as directed. Urinate often, even at night. Some products may contain alcohol. Ask your care team if this medication contains alcohol. Be sure to tell all care teams you are taking this medicine. Certain medicines, like metronidazole and disulfiram, can cause an unpleasant reaction when taken with alcohol. The reaction includes flushing, headache, nausea, vomiting, sweating, and increased thirst. The reaction can last from 30 minutes to several hours. Talk to your care team if you wish to become pregnant or think you might be pregnant. This medication can cause serious birth defects if taken during pregnancy and for 1 year after the last dose. A negative pregnancy test is required before starting this medication. A reliable form of contraception is recommended while taking this medication and for 1 year after the last dose. Talk to your care team about reliable forms of contraception. Do not father a child while taking this medication and for 4 months after the last dose. Use a condom during this time period. Do not breast-feed while taking this medication or for 1 week after the last dose. This medication may cause infertility. Talk to your care team if you are concerned about your fertility. Talk to your care team about your risk of cancer. You may be more at risk for certain types of cancer if you take this medication. What side effects may I notice from receiving this medication? Side effects that you should report to your care team as soon as possible: Allergic reactions--skin rash, itching, hives, swelling of the face, lips, tongue, or throat Dry cough, shortness of breath or trouble breathing Heart failure--shortness of breath, swelling of the ankles,  feet, or hands, sudden weight gain, unusual weakness or fatigue Heart muscle inflammation--unusual weakness or fatigue, shortness of breath, chest pain, fast or irregular heartbeat, dizziness, swelling of the ankles, feet, or hands Heart rhythm changes--fast or irregular heartbeat, dizziness, feeling faint or lightheaded, chest pain, trouble breathing Infection--fever, chills, cough, sore throat, wounds that don't heal, pain or trouble when passing urine, general feeling of discomfort or being unwell Kidney injury--decrease in the amount of urine, swelling of the ankles, hands, or feet Liver injury--right upper belly pain, loss of appetite, nausea, light-colored stool, dark yellow or brown urine, yellowing skin or eyes, unusual weakness or fatigue Low red blood cell level--unusual weakness or fatigue, dizziness, headache, trouble breathing Low sodium level--muscle weakness, fatigue, dizziness, headache, confusion Red or dark brown urine Unusual bruising or bleeding Side effects that usually do not require medical attention (report to your  care team if they continue or are bothersome): Hair loss Irregular menstrual cycles or spotting Loss of appetite Nausea Pain, redness, or swelling with sores inside the mouth or throat Vomiting This list may not describe all possible side effects. Call your doctor for medical advice about side effects. You may report side effects to FDA at 1-800-FDA-1088. Where should I keep my medication? This medication is given in a hospital or clinic. It will not be stored at home. NOTE: This sheet is a summary. It may not cover all possible information. If you have questions about this medicine, talk to your doctor, pharmacist, or health care provider.  2023 Elsevier/Gold Standard (2021-03-10 00:00:00)

## 2022-01-15 ENCOUNTER — Other Ambulatory Visit: Payer: Self-pay | Admitting: *Deleted

## 2022-01-15 MED ORDER — DEXAMETHASONE 4 MG PO TABS: 4.0000 mg | ORAL_TABLET | Freq: Every day | ORAL | 0 refills | Status: DC

## 2022-01-21 NOTE — Progress Notes (Signed)
Patient is a pleasant 56 year old female with PMH of left-sided breast cancer s/p bilateral mastectomy with immediate reconstruction using tissue expander and Flex HD performed 10/29/2021 and subsequent right-sided infection and expander removal 12/17/2021 by Dr. Marla Roe who presents to clinic for postoperative follow-up.   She was last seen here in clinic on 01/08/2022 Dr. Marla Roe.  At that time, the skin on the right side at site of expander removal was little darker and tighter, as expected.  Encouraged massage as able.  Plan is to hold off on placing new expander into after her chemotherapy is finished.  On the left side, 50 cc was placed into the expander for a total of 150/455 cc.  Plan is for return in 2 weeks for possible repeat expander fill.  Most recent chemotherapy infusion 01/14/2022.  Today, she is doing okay.  She is accompanied by her husband at bedside.  She states that she has been having some adverse reactions from her chemotherapy.  From a breast reconstruction standpoint, she denies any concerns or complaints.  She states that she is about the size that she wants to be postoperatively on the left.  She obviously understands the right side reconstruction will be delayed after she finishes another 12 to 13 weeks of chemotherapy treatments.  On exam, right side expander removal site appears to be well-healed, a few residual absorbable sutures remain in place.  Will not remove.  Incision is CDI.  No evidence concerning for infection.  Left-sided expander site is also healthy appearing.  She does have some superior pole fullness/swelling, but the expander is positioned appropriately and the incision is CDI.  Skin is not too taut.  Discussed likely final fill given that she is already at her preferred size.  Given that she may contract a bit, discussed an additional 25 cc and she is agreeable.  We placed injectable saline in the Expander using a sterile technique: Left: 25 cc for a  total of 175 / 455 cc  She tolerated the expander fill without complication or difficulty.  She states that she has a particularly difficult upcoming chemotherapy treatment.  Follow-up in 2 weeks, sooner if she has any questions or concerns.

## 2022-01-22 ENCOUNTER — Encounter: Payer: Self-pay | Admitting: Physician Assistant

## 2022-01-22 ENCOUNTER — Ambulatory Visit (INDEPENDENT_AMBULATORY_CARE_PROVIDER_SITE_OTHER): Payer: Commercial Managed Care - HMO | Admitting: Physician Assistant

## 2022-01-22 VITALS — BP 112/75 | HR 87

## 2022-01-22 DIAGNOSIS — Z9889 Other specified postprocedural states: Secondary | ICD-10-CM

## 2022-01-26 MED FILL — Dexamethasone Sodium Phosphate Inj 100 MG/10ML: INTRAMUSCULAR | Qty: 1 | Status: AC

## 2022-01-26 MED FILL — Fosaprepitant Dimeglumine For IV Infusion 150 MG (Base Eq): INTRAVENOUS | Qty: 5 | Status: AC

## 2022-01-27 ENCOUNTER — Inpatient Hospital Stay: Payer: Commercial Managed Care - HMO

## 2022-01-27 ENCOUNTER — Encounter: Payer: Self-pay | Admitting: Adult Health

## 2022-01-27 ENCOUNTER — Inpatient Hospital Stay (HOSPITAL_BASED_OUTPATIENT_CLINIC_OR_DEPARTMENT_OTHER): Payer: Commercial Managed Care - HMO | Admitting: Adult Health

## 2022-01-27 DIAGNOSIS — C50512 Malignant neoplasm of lower-outer quadrant of left female breast: Secondary | ICD-10-CM

## 2022-01-27 DIAGNOSIS — Z171 Estrogen receptor negative status [ER-]: Secondary | ICD-10-CM

## 2022-01-27 LAB — CMP (CANCER CENTER ONLY)
ALT: 11 U/L (ref 0–44)
AST: 16 U/L (ref 15–41)
Albumin: 4.3 g/dL (ref 3.5–5.0)
Alkaline Phosphatase: 79 U/L (ref 38–126)
Anion gap: 7 (ref 5–15)
BUN: 11 mg/dL (ref 6–20)
CO2: 27 mmol/L (ref 22–32)
Calcium: 9.3 mg/dL (ref 8.9–10.3)
Chloride: 104 mmol/L (ref 98–111)
Creatinine: 0.59 mg/dL (ref 0.44–1.00)
GFR, Estimated: >60 mL/min (ref >60)
Glucose, Bld: 92 mg/dL (ref 70–99)
Potassium: 3.6 mmol/L (ref 3.5–5.1)
Sodium: 138 mmol/L (ref 135–145)
Total Bilirubin: 0.4 mg/dL (ref 0.3–1.2)
Total Protein: 6.9 g/dL (ref 6.5–8.1)

## 2022-01-27 LAB — CBC WITH DIFFERENTIAL (CANCER CENTER ONLY)
Abs Immature Granulocytes: 0.97 10*3/uL — ABNORMAL HIGH (ref 0.00–0.07)
Basophils Absolute: 0.1 10*3/uL (ref 0.0–0.1)
Basophils Relative: 1 %
Eosinophils Absolute: 0 10*3/uL (ref 0.0–0.5)
Eosinophils Relative: 0 %
HCT: 30.1 % — ABNORMAL LOW (ref 36.0–46.0)
Hemoglobin: 10.1 g/dL — ABNORMAL LOW (ref 12.0–15.0)
Immature Granulocytes: 10 %
Lymphocytes Relative: 16 %
Lymphs Abs: 1.5 10*3/uL (ref 0.7–4.0)
MCH: 32.1 pg (ref 26.0–34.0)
MCHC: 33.6 g/dL (ref 30.0–36.0)
MCV: 95.6 fL (ref 80.0–100.0)
Monocytes Absolute: 1.4 10*3/uL — ABNORMAL HIGH (ref 0.1–1.0)
Monocytes Relative: 15 %
Neutro Abs: 5.3 10*3/uL (ref 1.7–7.7)
Neutrophils Relative %: 58 %
Platelet Count: 197 10*3/uL (ref 150–400)
RBC: 3.15 MIL/uL — ABNORMAL LOW (ref 3.87–5.11)
RDW: 16.7 % — ABNORMAL HIGH (ref 11.5–15.5)
Smear Review: NORMAL
WBC Count: 9.3 10*3/uL (ref 4.0–10.5)
nRBC: 0.2 % (ref 0.0–0.2)

## 2022-01-27 MED ORDER — SODIUM CHLORIDE 0.9 % IV SOLN
10.0000 mg | Freq: Once | INTRAVENOUS | Status: AC
  Administered 2022-01-27: 10 mg via INTRAVENOUS
  Filled 2022-01-27: qty 10

## 2022-01-27 MED ORDER — HEPARIN SOD (PORK) LOCK FLUSH 100 UNIT/ML IV SOLN
500.0000 [IU] | Freq: Once | INTRAVENOUS | Status: AC | PRN
  Administered 2022-01-27: 500 [IU]

## 2022-01-27 MED ORDER — SODIUM CHLORIDE 0.9 % IV SOLN: Freq: Once | INTRAVENOUS | Status: AC

## 2022-01-27 MED ORDER — SODIUM CHLORIDE 0.9% FLUSH
10.0000 mL | INTRAVENOUS | Status: DC | PRN
  Administered 2022-01-27: 10 mL via INTRAVENOUS

## 2022-01-27 MED ORDER — PALONOSETRON HCL INJECTION 0.25 MG/5ML
0.2500 mg | Freq: Once | INTRAVENOUS | Status: AC
  Administered 2022-01-27: 0.25 mg via INTRAVENOUS
  Filled 2022-01-27: qty 5

## 2022-01-27 MED ORDER — SODIUM CHLORIDE 0.9 % IV SOLN
150.0000 mg | Freq: Once | INTRAVENOUS | Status: AC
  Administered 2022-01-27: 150 mg via INTRAVENOUS
  Filled 2022-01-27: qty 150

## 2022-01-27 MED ORDER — SODIUM CHLORIDE 0.9 % IV SOLN
500.0000 mg/m2 | Freq: Once | INTRAVENOUS | Status: AC
  Administered 2022-01-27: 760 mg via INTRAVENOUS
  Filled 2022-01-27: qty 38

## 2022-01-27 MED ORDER — SODIUM CHLORIDE 0.9% FLUSH
10.0000 mL | INTRAVENOUS | Status: DC | PRN
  Administered 2022-01-27: 10 mL

## 2022-01-27 MED ORDER — DOXORUBICIN HCL CHEMO IV INJECTION 2 MG/ML
50.0000 mg/m2 | Freq: Once | INTRAVENOUS | Status: AC
  Administered 2022-01-27: 76 mg via INTRAVENOUS
  Filled 2022-01-27: qty 38

## 2022-01-27 NOTE — Progress Notes (Signed)
[facility] Follow up:    Pcp, No No address on file   DIAGNOSIS:  Cancer Staging  Malignant neoplasm of lower-outer quadrant of left breast of female, estrogen receptor negative ([place]) Staging form: Breast, AJCC 8th Edition - Clinical stage from 09/14/2021: Stage IB (cT1c, cN0, cM0, G3, ER-, PR-, HER2-) - Signed by Hayden Pedro, PA-C on 09/14/2021 Stage prefix: Initial diagnosis Method of lymph node assessment: Clinical Histologic grading system: 3 grade system   SUMMARY OF ONCOLOGIC HISTORY: Oncology History  Malignant neoplasm of lower-outer quadrant of left breast of female, estrogen receptor negative ([place])  09/07/2021 Initial Diagnosis   Screening mammogram detected left breast asymmetry posteriorly.  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, Ki-67 70%   09/14/2021 Cancer Staging   Staging form: Breast, AJCC 8th Edition - Clinical stage from 09/14/2021: Stage IB (cT1c, cN0, cM0, G3, ER-, PR-, HER2-) - Signed by Hayden Pedro, PA-C on 09/14/2021 Stage prefix: Initial diagnosis Method of lymph node assessment: Clinical Histologic grading system: 3 grade system   09/26/2021 Genetic Testing   Pathogenic variant in BRCA2 at p.T0240* (c.3922G>T).  Report date is September 26, 2021 ([place]) and September 29, 2021 (expanded).    The CancerNext-Expanded gene panel offered by [facility] and includes sequencing, rearrangement, and RNA analysis for the following 77 genes: AIP, ALK, APC, ATM, AXIN2, BAP1, BARD1, BLM, BMPR1A, BRCA1, BRCA2, BRIP1, CDC73, CDH1, CDK4, CDKN1B, CDKN2A, CHEK2, CTNNA1, DICER1, FANCC, FH, FLCN, GALNT12, KIF1B, LZTR1, MAX, MEN1, MET, MLH1, MSH2, MSH3, MSH6, MUTYH, NBN, NF1, NF2, NTHL1, PALB2, PHOX2B, PMS2, POT1, PRKAR1A, PTCH1, PTEN, RAD51C, RAD51D, RB1, RECQL, RET, SDHA, SDHAF2, SDHB, SDHC, SDHD, SMAD4, SMARCA4, SMARCB1, SMARCE1, STK11, SUFU, TMEM127,  TP53, TSC1, TSC2, VHL and XRCC2 (sequencing and deletion/duplication); EGFR, EGLN1, HOXB13, KIT, MITF, PDGFRA, POLD1, and POLE (sequencing only); EPCAM and GREM1 (deletion/duplication only).    10/29/2021 Surgery   Bilateral mastectomies Right mastectomy: Benign, PASH Left mastectomy: Grade 3 IDC with DCIS 1.2 cm, margins negative, 0/5 sentinel lymph nodes negative, ER 0%, PR 0%, HER2 negative, Ki-67 70%   12/03/2021 -  Chemotherapy   Patient is on Treatment Plan : BREAST ADJUVANT DOSE DENSE AC q14d / PACLitaxel q7d       CURRENT THERAPY: Adriamycin and Cytoxan  INTERVAL HISTORY: Julie Townsend 56 y.o. female returns for f/u prior to receiving her fourth cycle of adjuvant Adriamycin and Cytoxan.  She is tolerating treatment well.  She has some fatigue, and nausea controlled with anti-emetics.  She says this was better since she took the Dexamethasone following her second cycle as compared to the nausea she developed following the first cycle.     Patient Active Problem List   Diagnosis Date Noted   Breast cancer ([place]) 10/29/2021   BRCA2 gene mutation positive 09/26/2021   Genetic testing 09/26/2021   Family history of breast cancer 09/22/2021   Family history of pancreatic cancer 09/22/2021   Family history of ovarian cancer 09/22/2021   Malignant neoplasm of lower-outer quadrant of left breast of female, estrogen receptor negative ([place]) 09/11/2021    has No Known Allergies.  MEDICAL HISTORY: Past Medical History:  Diagnosis Date   BRCA2 gene mutation positive 09/26/2021   Breast cancer ([place]) 09/2021   left breast IDC   Family history of breast cancer 09/22/2021   Family history of ovarian cancer 09/22/2021   Family history of pancreatic cancer 09/22/2021  Genetic testing 09/26/2021   Pathogenic variant in BRCA2 at p.K4818* (c.3922G>T).  Report date is September 26, 2021.    The BRCAplus panel offered by [employer] and includes sequencing and deletion/duplication  analysis for the following 8 genes: ATM, BRCA1, BRCA2, CDH1, CHEK2, PALB2, PTEN, and TP53.  Results of pan-cancer panel are pending.     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    correct Hands    SURGICAL HISTORY: Past Surgical History:  Procedure Laterality Date   BREAST IMPLANT REMOVAL Right 12/17/2021   Procedure: Right breast expander removal with washout;  Surgeon: Wallace Going, DO;  Location: [facility];  Service: Plastics;  Laterality: Right;   BREAST RECONSTRUCTION WITH PLACEMENT OF TISSUE EXPANDER AND FLEX HD (ACELLULAR HYDRATED DERMIS) Bilateral 10/29/2021   Procedure: BREAST RECONSTRUCTION WITH PLACEMENT OF TISSUE EXPANDER AND FLEX HD (ACELLULAR HYDRATED DERMIS);  Surgeon: Wallace Going, DO;  Location: [facility];  Service: Plastics;  Laterality: Bilateral;   CARPAL TUNNEL RELEASE Right 06/20/2020   Procedure: CARPAL TUNNEL RELEASE;  Surgeon: [name], MD;  Location: [facility];  Service: Orthopedics;  Laterality: Right;   MASTECTOMY W/ SENTINEL NODE BIOPSY Left 10/29/2021   Procedure: LEFT MASTECTOMY WITH LEFT SENTINEL LYMPH NODE BIOPSY;  Surgeon: [name], MD;  Location: [facility];  Service: General;  Laterality: Left;   PORTACATH PLACEMENT Right 10/29/2021   Procedure: INSERTION PORT-A-CATH;  Surgeon: [name], MD;  Location: [facility];  Service: General;  Laterality: Right;   TOTAL MASTECTOMY Right 10/29/2021   Procedure: RIGHT TOTAL MASTECTOMY;  Surgeon: [name], MD;  Location: [facility];  Service: General;  Laterality: Right;   TRIGGER FINGER RELEASE Left 05/08/2020   Procedure: LEFT LONG FINGER TRIGGER RELEASE AND LEFT RING FINGER TRIGGER RELEASE;  Surgeon: [name], MD;  Location: [facility];  Service: Orthopedics;  Laterality: Left;   trigger finger release rt hand      SOCIAL HISTORY: Social  History   Socioeconomic History   Marital status: Married    Spouse name: Not on file   Number of children: Not on file   Years of education: Not on file   Highest education level: Not on file  Occupational History   Not on file  Tobacco Use   Smoking status: Never   Smokeless tobacco: Never  Vaping Use   Vaping Use: Never used  Substance and Sexual Activity   Alcohol use: Never   Drug use: Never   Sexual activity: Yes    Birth control/protection: Post-menopausal  Other Topics Concern   Not on file  Social History Narrative   Lives with husband.   Right handed   Social Determinants of Health   Financial Resource Strain: Not on file  Food Insecurity: Not on file  Transportation Needs: Not on file  Physical Activity: Not on file  Stress: Not on file  Social Connections: Not on file  Intimate Partner Violence: Not on file    FAMILY HISTORY: Family History  Problem Relation Age of Onset   Migraines Mother    Breast cancer Mother 64       recurr at  82   Migraines Maternal Aunt    Colon cancer Maternal Uncle        dx late 54s   Pancreatic cancer Maternal Uncle 28   Migraines Maternal Grandmother    Ovarian cancer Maternal Grandmother  dx 19s    Review of Systems  Constitutional:  Negative for appetite change, chills, fatigue, fever and unexpected weight change.  HENT:   Negative for hearing loss, lump/mass and trouble swallowing.   Eyes:  Negative for eye problems and icterus.  Respiratory:  Negative for chest tightness, cough and shortness of breath.   Cardiovascular:  Negative for chest pain, leg swelling and palpitations.  Gastrointestinal:  Negative for abdominal distention, abdominal pain, constipation, diarrhea, nausea and vomiting.  Endocrine: Negative for hot flashes.  Genitourinary:  Negative for difficulty urinating.   Musculoskeletal:  Negative for arthralgias.  Skin:  Negative for itching and rash.  Neurological:  Negative for dizziness,  extremity weakness, headaches and numbness.  Hematological:  Negative for adenopathy. Does not bruise/bleed easily.  Psychiatric/Behavioral:  Negative for depression. The patient is not nervous/anxious.       PHYSICAL EXAMINATION  ECOG PERFORMANCE STATUS: 1 - Symptomatic but completely ambulatory  Vitals:   01/27/22 1355  BP: 103/80  Pulse: 97  Resp: 16  Temp: 97.8 F (36.6 C)  SpO2: 100%    Physical Exam Constitutional:      General: She is not in acute distress.    Appearance: Normal appearance. She is not toxic-appearing.  HENT:     Head: Normocephalic and atraumatic.  Eyes:     General: No scleral icterus. Cardiovascular:     Rate and Rhythm: Normal rate and regular rhythm.     Pulses: Normal pulses.     Heart sounds: Normal heart sounds.  Pulmonary:     Effort: Pulmonary effort is normal.     Breath sounds: Normal breath sounds.  Abdominal:     General: Abdomen is flat. Bowel sounds are normal. There is no distension.     Palpations: Abdomen is soft.     Tenderness: There is no abdominal tenderness.  Musculoskeletal:        General: No swelling.     Cervical back: Neck supple.  Lymphadenopathy:     Cervical: No cervical adenopathy.  Skin:    General: Skin is warm and dry.     Findings: No rash.  Neurological:     General: No focal deficit present.     Mental Status: She is alert.  Psychiatric:        Mood and Affect: Mood normal.        Behavior: Behavior normal.     LABORATORY DATA:  CBC    Component Value Date/Time   WBC 9.3 01/27/2022 1343   RBC 3.15 (L) 01/27/2022 1343   HGB 10.1 (L) 01/27/2022 1343   HCT 30.1 (L) 01/27/2022 1343   PLT 197 01/27/2022 1343   MCV 95.6 01/27/2022 1343   MCH 32.1 01/27/2022 1343   MCHC 33.6 01/27/2022 1343   RDW 16.7 (H) 01/27/2022 1343   LYMPHSABS 1.5 01/27/2022 1343   MONOABS 1.4 (H) 01/27/2022 1343   EOSABS 0.0 01/27/2022 1343   BASOSABS 0.1 01/27/2022 1343    CMP     Component Value Date/Time    NA 138 01/27/2022 1343   K 3.6 01/27/2022 1343   CL 104 01/27/2022 1343   CO2 27 01/27/2022 1343   GLUCOSE 92 01/27/2022 1343   BUN 11 01/27/2022 1343   CREATININE 0.59 01/27/2022 1343   CALCIUM 9.3 01/27/2022 1343   PROT 6.9 01/27/2022 1343   ALBUMIN 4.3 01/27/2022 1343   AST 16 01/27/2022 1343   ALT 11 01/27/2022 1343   ALKPHOS 79 01/27/2022 1343   BILITOT  0.4 01/27/2022 1343   GFRNONAA >60 01/27/2022 1343         ASSESSMENT and THERAPY PLAN:   Malignant neoplasm of lower-outer quadrant of left breast of female, estrogen receptor negative ([place]) Julie Townsend is a 56 year old woman with stage IB triple negative breast cancer diagnosed in 09/2021 s/p bilateral mastectomies who continues on adjuvant chemotherapy.  She is receiving her fourth cycle of Adriamycin and Cytoxan.  Julie Townsend is doing well today.  Her labs are within parameters which we reviewed and she will proceed with her fourth  cycle of adjuvant chemotherapy.  She is tolerating it well.    We discussed her upcoming treatment with 12 cycles of Taxol.  We reviewed common side effects that can happen with Taxol.    Julie Townsend will return in 2 weeks for labs, f/u, and to begin Taxol.     All questions were answered. The patient knows to call the clinic with any problems, questions or concerns. We can certainly see the patient much sooner if necessary.  Total encounter time:20 minutes*in face-to-face visit time, chart review, lab review, care coordination, order entry, and documentation of the encounter time.    Wilber Bihari, NP 01/30/22 12:18 PM Medical Oncology and Hematology [facility] [place], [place] [address] Tel. [phone]    Fax. [phone]  *Total Encounter Time as defined by the Centers for Medicare and Medicaid Services includes, in addition to the face-to-face time of a patient visit (documented in the note above) non-face-to-face time: obtaining and reviewing outside history, ordering  and reviewing medications, tests or procedures, care coordination (communications with other health care professionals or caregivers) and documentation in the medical record.

## 2022-01-27 NOTE — Patient Instructions (Signed)
[facility] ONCOLOGY  Discharge Instructions: Thank you for choosing [facility] to provide your oncology and hematology care.   If you have a lab appointment with the [facility], please go directly to the [facility] and check in at the registration area.   Wear comfortable clothing and clothing appropriate for easy access to any Portacath or PICC line.   We strive to give you quality time with your provider. You may need to reschedule your appointment if you arrive late (15 or more minutes).  Arriving late affects you and other patients whose appointments are after yours.  Also, if you miss three or more appointments without notifying the office, you may be dismissed from the clinic at the provider's discretion.      For prescription refill requests, have your pharmacy contact our office and allow 72 hours for refills to be completed.    Today you received the following chemotherapy and/or immunotherapy agents: Adriamycin/Cytoxan      To help prevent nausea and vomiting after your treatment, we encourage you to take your nausea medication as directed.  BELOW ARE SYMPTOMS THAT SHOULD BE REPORTED IMMEDIATELY: *FEVER GREATER THAN 100.4 F (38 C) OR HIGHER *CHILLS OR SWEATING *NAUSEA AND VOMITING THAT IS NOT CONTROLLED WITH YOUR NAUSEA MEDICATION *UNUSUAL SHORTNESS OF BREATH *UNUSUAL BRUISING OR BLEEDING *URINARY PROBLEMS (pain or burning when urinating, or frequent urination) *BOWEL PROBLEMS (unusual diarrhea, constipation, pain near the anus) TENDERNESS IN MOUTH AND THROAT WITH OR WITHOUT PRESENCE OF ULCERS (sore throat, sores in mouth, or a toothache) UNUSUAL RASH, SWELLING OR PAIN  UNUSUAL VAGINAL DISCHARGE OR ITCHING   Items with * indicate a potential emergency and should be followed up as soon as possible or go to the Emergency Department if any problems should occur.  Please show the CHEMOTHERAPY ALERT CARD or IMMUNOTHERAPY ALERT CARD at  check-in to the Emergency Department and triage nurse.  Should you have questions after your visit or need to cancel or reschedule your appointment, please contact [facility]  Dept: [phone]  and follow the prompts.  Office hours are 8:00 a.m. to 4:30 p.m. Monday - Friday. Please note that voicemails left after 4:00 p.m. may not be returned until the following business day.  We are closed weekends and major holidays. You have access to a nurse at all times for urgent questions. Please call the main number to the clinic Dept: [phone] and follow the prompts.   For any non-urgent questions, you may also contact your provider using MyChart. We now offer e-Visits for anyone 76 and older to request care online for non-urgent symptoms. For details visit [email or web address].   Also download the MyChart app! Go to the app store, search "MyChart", open the app, select [facility], and log in with your MyChart username and password.

## 2022-01-27 NOTE — Progress Notes (Signed)
Per Wilber Bihari, NP okay to proceed with pre-meds while waiting on CMP results.

## 2022-01-28 ENCOUNTER — Ambulatory Visit: Payer: Commercial Managed Care - HMO | Admitting: Hematology and Oncology

## 2022-01-28 ENCOUNTER — Ambulatory Visit: Payer: Commercial Managed Care - HMO

## 2022-01-28 ENCOUNTER — Other Ambulatory Visit: Payer: Commercial Managed Care - HMO

## 2022-01-30 ENCOUNTER — Encounter: Payer: Self-pay | Admitting: Hematology and Oncology

## 2022-01-30 ENCOUNTER — Other Ambulatory Visit: Payer: Self-pay | Admitting: Neurology

## 2022-01-30 NOTE — Assessment & Plan Note (Signed)
Julie Townsend is a 56 year old woman with stage IB triple negative breast cancer diagnosed in 09/2021 s/p bilateral mastectomies who continues on adjuvant chemotherapy.  She is receiving her fourth cycle of Adriamycin and Cytoxan.  Julie Townsend is doing well today.  Her labs are within parameters which we reviewed and she will proceed with her fourth  cycle of adjuvant chemotherapy.  She is tolerating it well.    We discussed her upcoming treatment with 12 cycles of Taxol.  We reviewed common side effects that can happen with Taxol.    Julie Townsend will return in 2 weeks for labs, f/u, and to begin Taxol.

## 2022-02-04 ENCOUNTER — Ambulatory Visit: Payer: Commercial Managed Care - HMO | Admitting: Hematology and Oncology

## 2022-02-04 ENCOUNTER — Other Ambulatory Visit: Payer: Commercial Managed Care - HMO

## 2022-02-04 ENCOUNTER — Ambulatory Visit: Payer: Commercial Managed Care - HMO

## 2022-02-04 NOTE — Progress Notes (Signed)
Patient Care Team: Pcp, No as PCP - General [name], RN as Oncology Nurse Navigator Mauro Kaufmann, RN as Oncology Nurse Navigator [name], MD as Consulting Physician (General Surgery) Nicholas Lose, MD as Consulting Physician (Hematology and Oncology) Kyung Rudd, MD as Consulting Physician (Radiation Oncology)  DIAGNOSIS:  Encounter Diagnosis  Name Primary?   Malignant neoplasm of lower-outer quadrant of left breast of female, estrogen receptor negative ([place]) Yes    SUMMARY OF ONCOLOGIC HISTORY: Oncology History  Malignant neoplasm of lower-outer quadrant of left breast of female, estrogen receptor negative ([place])  09/07/2021 Initial Diagnosis   Screening mammogram detected left breast asymmetry posteriorly.  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, Ki-67 70%   09/14/2021 Cancer Staging   Staging form: Breast, AJCC 8th Edition - Clinical stage from 09/14/2021: Stage IB (cT1c, cN0, cM0, G3, ER-, PR-, HER2-) - Signed by Hayden Pedro, PA-C on 09/14/2021 Stage prefix: Initial diagnosis Method of lymph node assessment: Clinical Histologic grading system: 3 grade system   09/26/2021 Genetic Testing   Pathogenic variant in BRCA2 at p.Z3664* (c.3922G>T).  Report date is September 26, 2021 ([place]) and September 29, 2021 (expanded).    The CancerNext-Expanded gene panel offered by [facility] and includes sequencing, rearrangement, and RNA analysis for the following 77 genes: AIP, ALK, APC, ATM, AXIN2, BAP1, BARD1, BLM, BMPR1A, BRCA1, BRCA2, BRIP1, CDC73, CDH1, CDK4, CDKN1B, CDKN2A, CHEK2, CTNNA1, DICER1, FANCC, FH, FLCN, GALNT12, KIF1B, LZTR1, MAX, MEN1, MET, MLH1, MSH2, MSH3, MSH6, MUTYH, NBN, NF1, NF2, NTHL1, PALB2, PHOX2B, PMS2, POT1, PRKAR1A, PTCH1, PTEN, RAD51C, RAD51D, RB1, RECQL, RET, SDHA, SDHAF2, SDHB, SDHC, SDHD, SMAD4, SMARCA4, SMARCB1, SMARCE1, STK11, SUFU, TMEM127, TP53,  TSC1, TSC2, VHL and XRCC2 (sequencing and deletion/duplication); EGFR, EGLN1, HOXB13, KIT, MITF, PDGFRA, POLD1, and POLE (sequencing only); EPCAM and GREM1 (deletion/duplication only).    10/29/2021 Surgery   Bilateral mastectomies Right mastectomy: Benign, PASH Left mastectomy: Grade 3 IDC with DCIS 1.2 cm, margins negative, 0/5 sentinel lymph nodes negative, ER 0%, PR 0%, HER2 negative, Ki-67 70%   12/03/2021 -  Chemotherapy   Patient is on Treatment Plan : BREAST ADJUVANT DOSE DENSE AC q14d / PACLitaxel q7d       CHIEF COMPLIANT: Cycle 1 Taxol  INTERVAL HISTORY: Julie Townsend is a 57 y.o. female with the above-mentioned left breast cancer. She presents to the clinic for a follow-up and cycle 1 Taxol.  After the last cycle of Adriamycin Cytoxan she had intense pain for 3 days after chemo as well as severe fatigue and nausea.  All of which have resolved at this current time.  She will spot of redness on the chest wall and is currently being treated with clindamycin antibiotic.   ALLERGIES:  has No Known Allergies.  MEDICATIONS:  Current Outpatient Medications  Medication Sig Dispense Refill   clindamycin (CLEOCIN) 150 MG capsule Take 3 capsules (450 mg total) by mouth 3 (three) times daily for 7 days. 63 capsule 0   cyclobenzaprine (FLEXERIL) 5 MG tablet Take 5 mg by mouth 3 (three) times daily as needed for muscle spasms.     dexamethasone (DECADRON) 4 MG tablet Take 1 tablet (4 mg total) by mouth daily. Take one tablet by mouth in the morning for two days after chemotherapy. 8 tablet 0   diazepam (VALIUM) 2 MG tablet Take 1 tablet (2 mg total) by mouth every 12 (twelve) hours as needed  for up to 20 doses for muscle spasms. 20 tablet 0   Galcanezumab-gnlm (EMGALITY) 120 MG/ML SOAJ Inject 120 mg into the skin every 28 (twenty-eight) days. 3 mL 1   lidocaine-prilocaine (EMLA) cream Apply to affected area once 30 g 3   nortriptyline (PAMELOR) 10 MG capsule Take 1 capsule (10 mg  total) by mouth at bedtime. 30 capsule 5   ondansetron (ZOFRAN) 4 MG tablet Take 1 tablet (4 mg total) by mouth every 8 (eight) hours as needed for up to 20 doses for nausea or vomiting. 20 tablet 0   ondansetron (ZOFRAN) 8 MG tablet Take 1 tab (8 mg) by mouth every 8 hrs as needed for nausea/vomiting. Start third day after doxorubicin/cyclophosphamide chemotherapy. 30 tablet 1   pegfilgrastim-cbqv (UDENYCA) 6 MG/0.6ML injection INJECT THE CONTENTS OF 1 SYRINGE (6 MG) UNDER THE SKIN ONCE EVERY 14 DAYS AFTER CHEMOTHERAPY 0.6 mL 0   prochlorperazine (COMPAZINE) 10 MG tablet Take 1 tablet (10 mg total) by mouth every 6 (six) hours as needed for nausea or vomiting. 30 tablet 1   SUMAtriptan (IMITREX) 50 MG tablet TAKE 1 TAB BY MOUTH AS NEEDED FOR MIGRAINE. MAX 2 TABS IN 24 HOURS. MAY REPEAT IN 2 HOURS IF HEADACHE PERSISTS OR RECURS. 30 tablet 1   UBRELVY 100 MG TABS TAKE AS NEEDED (TAKE 1 AT THE EARLIST ONSET OF A MIGRAINE. MAY REPEAT IN 2 HOURS. MAX 2/24 HOURS). 30 tablet 1   Zavegepant HCl (ZAVZPRET) 10 MG/ACT SOLN Place 10 mg into the nose daily as needed. 6 each 11   No current facility-administered medications for this visit.    PHYSICAL EXAMINATION: ECOG PERFORMANCE STATUS: 1 - Symptomatic but completely ambulatory  Vitals:   02/11/22 1131  BP: 121/79  Pulse: 95  Temp: 98.2 F (36.8 C)  SpO2: 100%   Filed Weights   02/11/22 1131  Weight: 115 lb 11.2 oz (52.5 kg)    BREAST: No palpable masses or nodules in either right or left breasts. No palpable axillary supraclavicular or infraclavicular adenopathy no breast tenderness or nipple discharge. (exam performed in the presence of a chaperone)  LABORATORY DATA:  I have reviewed the data as listed    Latest Ref Rng & Units 01/27/2022    1:43 PM 01/14/2022   11:37 AM 12/31/2021   11:45 AM  CMP  Glucose 70 - 99 mg/dL 92  91  94   BUN 6 - 20 mg/dL _0 Creatinine 0.44 - 1.00 mg/dL 0.59  0.54  0.44   Sodium 135 - 145 mmol/L  138  140  140   Potassium 3.5 - 5.1 mmol/L 3.6  4.1  3.7   Chloride 98 - 111 mmol/L 104  104  106   CO2 22 - 32 mmol/L _1 Calcium 8.9 - 10.3 mg/dL 9.3  9.8  9.4   Total Protein 6.5 - 8.1 g/dL 6.9  6.8  6.7   Total Bilirubin 0.3 - 1.2 mg/dL 0.4  0.3  0.5   Alkaline Phos 38 - 126 U/L 79  92  61   AST 15 - 41 U/L _2 ALT 0 - 44 U/L _3 Lab Results  Component Value Date   WBC 10.2 02/11/2022   HGB 9.3 (L) 02/11/2022   HCT 27.6 (L) 02/11/2022   MCV 98.2 02/11/2022   PLT 301 02/11/2022   NEUTROABS 7.5  02/11/2022    ASSESSMENT & PLAN:  Malignant neoplasm of lower-outer quadrant of left breast of female, estrogen receptor negative ([place]) 09/07/2021:Screening mammogram detected left breast asymmetry posteriorly.  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, margins negative, 0/5 sentinel lymph nodes negative, ER 0%, PR 0%, HER2 negative, Ki-67 70%   Treatment plan: Adjuvant chemotherapy with Adriamycin and Cytoxan followed by Taxol started 12/03/2021 No role of adjuvant radiation or antiestrogen therapy --------------------------------------------------------------------------------------------------------------------------------------------------------------------- Current treatment: Completed 4 cycles of dose dense Adriamycin and Cytoxan, today cycle 1 Taxol Echocardiogram 09/22/2021: EF 60 to 65%   Chemotoxicities: Nausea: Severe for 2 to 3 days after chemo  fatigue Chemotherapy-induced anemia: Today's hemoglobin is 9.3    Breast pain: Expander has been removed and the pain is improved.  Slight redness on the chest wall that is being treated with antibiotics She will need a oophorectomy once chemotherapy is completed.    Return to clinic in weekly for Taxol treatments.    No orders of the defined types were placed in this encounter.  The patient has a good understanding of the overall plan. she agrees with it. she will call with any problems that may develop before the next visit here. Total time spent: 30 mins including face to face time and time spent for planning, charting and co-ordination of care   Harriette Ohara, MD 02/11/22    I Gardiner Coins am acting as a Education administrator for [employer]  I have reviewed the above documentation for accuracy and completeness, and I agree with the above.

## 2022-02-08 ENCOUNTER — Encounter: Payer: Self-pay | Admitting: Physician Assistant

## 2022-02-08 ENCOUNTER — Ambulatory Visit (INDEPENDENT_AMBULATORY_CARE_PROVIDER_SITE_OTHER): Payer: Commercial Managed Care - HMO | Admitting: Physician Assistant

## 2022-02-08 VITALS — BP 116/82 | HR 100

## 2022-02-08 DIAGNOSIS — Z9889 Other specified postprocedural states: Secondary | ICD-10-CM

## 2022-02-08 MED ORDER — CLINDAMYCIN HCL 150 MG PO CAPS: 450.0000 mg | ORAL_CAPSULE | Freq: Three times a day (TID) | ORAL | 0 refills | Status: AC

## 2022-02-08 NOTE — Progress Notes (Signed)
Patient is a pleasant 57 year old female with PMH of left-sided breast cancer s/p bilateral mastectomy with immediate reconstruction using tissue expander and Flex HD performed 10/29/2021 and subsequent right-sided infection and expander removal 12/17/2021 by Dr. Marla Roe who presents to clinic for postoperative follow-up.  She was last seen here in clinic on 01/22/2022.  At that time, she was doing okay from a breast reconstruction standpoint, but was symptomatic from her chemotherapy.  Plan is for delayed reconstruction with right side expander placement after she has completed her chemotherapy.  On the left side, she felt as though she was as large as she would like to be postoperatively.  Discussed final fill and 25 cc was placed for a total of 175/455 cc on the left.  Exam on each side was reassuring.    Today, patient is accompanied by her husband at bedside.  She tells me that approximately 1 week ago she was accidentally kicked in the chest while playing with her grandson.  Since then, she has noticed a small swollen area of redness and tenderness over medial aspect of right expander removal site.  Patient is unclear as to whether or not this was secondary to the mild trauma because she was not kicked particularly hard and her sensation is diminished.  She denies any worsening of the redness/tenderness since initial realization 1 week ago.  She also denies any fevers or other systemic symptoms.  She is scheduled for her fifth round of chemotherapy 02/11/2022 which is also accompanied by steroids.  She will have a total of 12 rounds of chemotherapy, finalizing early April 2024.  She does not want to be any larger than the left side.  Will defer expander fill at today's visit, but may continue with small 10 cc expander fills in the future to help mitigate risk of capsular formation.  On exam, her right side expander removal site and closure appears to be healing quite nicely.  Incision CDI.  Just  medial to the mastectomy incision on that side, there is a 2 x 2 area of mild swelling, but no fluctuance.  Tender to palpation.  Mild erythema.  No induration or other skin changes.  Left-sided expander still positioned appropriately, skin is not too taut.  While there is no considerable evidence on exam for infection, the area is newly tender.  Additionally, it is the same specific area from which her previous infection had originated which led to the expander removal.  Reviewed culture obtained from her 12/17/2021 washout and there was rare Staphylococcus aureus, no anaerobes isolated.  Resistant only to tetracyclines.  Will prescribe clindamycin 450 mg 3 times daily x 7 days.  She would like to proceed with watchful waiting which is not unreasonable given that it has not gotten any worse and is not particularly concerning on exam, but she will start taking the antibiotic immediately if she notices any worsening in her tenderness, swelling, or redness.  Will also arrange for telephone encounter in 2 days for update.  Picture(s) obtained of the patient and placed in the chart were with the patient's or guardian's permission.

## 2022-02-10 ENCOUNTER — Telehealth (INDEPENDENT_AMBULATORY_CARE_PROVIDER_SITE_OTHER): Payer: Commercial Managed Care - HMO | Admitting: Physician Assistant

## 2022-02-10 DIAGNOSIS — Z9889 Other specified postprocedural states: Secondary | ICD-10-CM

## 2022-02-10 MED FILL — Dexamethasone Sodium Phosphate Inj 100 MG/10ML: INTRAMUSCULAR | Qty: 1 | Status: AC

## 2022-02-10 NOTE — Progress Notes (Signed)
Patient is a pleasant 57 year old female with PMH of left-sided breast cancer s/p bilateral mastectomy with immediate reconstruction using tissue expander and Flex HD performed 10/29/2021 and subsequent right-sided infection and expander removal 12/17/2021 by Dr. Marla Roe who presents to clinic for postoperative follow-up.   The patient, Julie Townsend, gave permission to have this encounter performed via telemedicine, two identifiers were used to confirm patient's identity.  They also consented to chart review and treatment via this encounter.  The patient was at home and this provider was calling from their office.    She was just seen here in clinic on 02/08/2022.  At that time, she reported that she was accidentally kicked in the chest by her grandson while playing and has since noticed an area of mild redness, swelling, and tenderness over medial aspect of right expander removal site.  This is the same area that had previously been infected leading to her expander removal.  She is scheduled for chemotherapy and steroids tomorrow.  She was prescribed clindamycin, but plan was for close 48-hour follow-up to see if there is any progression or improvement.  Today, patient still continues to endorse some discomfort at the area of concern.  Her daughters report that the redness has now extended slightly beyond the borders that had been marked in the clinic.  She will start clindamycin today as we were proceeding with a watch and wait approach.  She will not be receiving any steroids tomorrow with her chemotherapy.  She denies any fevers or other systemic symptoms.  No other areas of concern.  Plan for her to take the clindamycin as directed and notify the clinic should she have any new or worsening symptoms.  Plan for her to follow-up with [place] in 6 days for reevaluation when Dr. Marla Roe is in clinic, but told her that she will need to come in to the office in 2 days if she notices any continued worsening so that she  can be evaluated.  She voices understanding and is agreeable to the plan.

## 2022-02-11 ENCOUNTER — Inpatient Hospital Stay: Payer: Commercial Managed Care - HMO

## 2022-02-11 ENCOUNTER — Other Ambulatory Visit: Payer: Self-pay | Admitting: Hematology and Oncology

## 2022-02-11 ENCOUNTER — Other Ambulatory Visit: Payer: Self-pay

## 2022-02-11 ENCOUNTER — Inpatient Hospital Stay: Payer: Commercial Managed Care - HMO | Admitting: Hematology and Oncology

## 2022-02-11 ENCOUNTER — Inpatient Hospital Stay: Payer: Commercial Managed Care - HMO | Attending: Hematology and Oncology

## 2022-02-11 VITALS — BP 114/82 | HR 90 | Temp 98.4°F | Resp 18

## 2022-02-11 VITALS — BP 121/79 | HR 95 | Temp 98.2°F | Wt 115.7 lb

## 2022-02-11 DIAGNOSIS — Z7952 Long term (current) use of systemic steroids: Secondary | ICD-10-CM | POA: Insufficient documentation

## 2022-02-11 DIAGNOSIS — D6481 Anemia due to antineoplastic chemotherapy: Secondary | ICD-10-CM | POA: Diagnosis not present

## 2022-02-11 DIAGNOSIS — C50512 Malignant neoplasm of lower-outer quadrant of left female breast: Secondary | ICD-10-CM | POA: Diagnosis not present

## 2022-02-11 DIAGNOSIS — Z171 Estrogen receptor negative status [ER-]: Secondary | ICD-10-CM

## 2022-02-11 DIAGNOSIS — Z1501 Genetic susceptibility to malignant neoplasm of breast: Secondary | ICD-10-CM | POA: Diagnosis not present

## 2022-02-11 DIAGNOSIS — Z5111 Encounter for antineoplastic chemotherapy: Secondary | ICD-10-CM | POA: Diagnosis present

## 2022-02-11 DIAGNOSIS — Z79899 Other long term (current) drug therapy: Secondary | ICD-10-CM | POA: Diagnosis not present

## 2022-02-11 DIAGNOSIS — T451X5A Adverse effect of antineoplastic and immunosuppressive drugs, initial encounter: Secondary | ICD-10-CM | POA: Insufficient documentation

## 2022-02-11 DIAGNOSIS — Z9013 Acquired absence of bilateral breasts and nipples: Secondary | ICD-10-CM | POA: Insufficient documentation

## 2022-02-11 DIAGNOSIS — Z95828 Presence of other vascular implants and grafts: Secondary | ICD-10-CM

## 2022-02-11 LAB — CBC WITH DIFFERENTIAL (CANCER CENTER ONLY)
Abs Immature Granulocytes: 0.46 10*3/uL — ABNORMAL HIGH (ref 0.00–0.07)
Basophils Absolute: 0.1 10*3/uL (ref 0.0–0.1)
Basophils Relative: 1 %
Eosinophils Absolute: 0 10*3/uL (ref 0.0–0.5)
Eosinophils Relative: 0 %
HCT: 27.6 % — ABNORMAL LOW (ref 36.0–46.0)
Hemoglobin: 9.3 g/dL — ABNORMAL LOW (ref 12.0–15.0)
Immature Granulocytes: 5 %
Lymphocytes Relative: 12 %
Lymphs Abs: 1.3 10*3/uL (ref 0.7–4.0)
MCH: 33.1 pg (ref 26.0–34.0)
MCHC: 33.7 g/dL (ref 30.0–36.0)
MCV: 98.2 fL (ref 80.0–100.0)
Monocytes Absolute: 0.9 10*3/uL (ref 0.1–1.0)
Monocytes Relative: 9 %
Neutro Abs: 7.5 10*3/uL (ref 1.7–7.7)
Neutrophils Relative %: 73 %
Platelet Count: 301 10*3/uL (ref 150–400)
RBC: 2.81 MIL/uL — ABNORMAL LOW (ref 3.87–5.11)
RDW: 18.4 % — ABNORMAL HIGH (ref 11.5–15.5)
WBC Count: 10.2 10*3/uL (ref 4.0–10.5)
nRBC: 0 % (ref 0.0–0.2)

## 2022-02-11 LAB — CMP (CANCER CENTER ONLY)
ALT: 9 U/L (ref 0–44)
AST: 14 U/L — ABNORMAL LOW (ref 15–41)
Albumin: 4.1 g/dL (ref 3.5–5.0)
Alkaline Phosphatase: 76 U/L (ref 38–126)
Anion gap: 7 (ref 5–15)
BUN: 14 mg/dL (ref 6–20)
CO2: 28 mmol/L (ref 22–32)
Calcium: 9.4 mg/dL (ref 8.9–10.3)
Chloride: 104 mmol/L (ref 98–111)
Creatinine: 0.46 mg/dL (ref 0.44–1.00)
GFR, Estimated: >60 mL/min (ref >60)
Glucose, Bld: 84 mg/dL (ref 70–99)
Potassium: 3.7 mmol/L (ref 3.5–5.1)
Sodium: 139 mmol/L (ref 135–145)
Total Bilirubin: 0.3 mg/dL (ref 0.3–1.2)
Total Protein: 6.9 g/dL (ref 6.5–8.1)

## 2022-02-11 MED ORDER — SODIUM CHLORIDE 0.9 % IV SOLN
80.0000 mg/m2 | Freq: Once | INTRAVENOUS | Status: AC
  Administered 2022-02-11: 120 mg via INTRAVENOUS
  Filled 2022-02-11: qty 20

## 2022-02-11 MED ORDER — FAMOTIDINE IN NACL 20-0.9 MG/50ML-% IV SOLN
20.0000 mg | Freq: Once | INTRAVENOUS | Status: AC
  Administered 2022-02-11: 20 mg via INTRAVENOUS
  Filled 2022-02-11: qty 50

## 2022-02-11 MED ORDER — DIPHENHYDRAMINE HCL 50 MG/ML IJ SOLN
25.0000 mg | Freq: Once | INTRAMUSCULAR | Status: AC
  Administered 2022-02-11: 25 mg via INTRAVENOUS
  Filled 2022-02-11: qty 1

## 2022-02-11 MED ORDER — PALONOSETRON HCL INJECTION 0.25 MG/5ML
0.2500 mg | Freq: Once | INTRAVENOUS | Status: AC
  Administered 2022-02-11: 0.25 mg via INTRAVENOUS
  Filled 2022-02-11: qty 5

## 2022-02-11 MED ORDER — SODIUM CHLORIDE 0.9% FLUSH
10.0000 mL | INTRAVENOUS | Status: DC | PRN
  Administered 2022-02-11: 10 mL

## 2022-02-11 MED ORDER — SODIUM CHLORIDE 0.9 % IV SOLN: Freq: Once | INTRAVENOUS | Status: AC

## 2022-02-11 MED ORDER — HEPARIN SOD (PORK) LOCK FLUSH 100 UNIT/ML IV SOLN
500.0000 [IU] | Freq: Once | INTRAVENOUS | Status: AC | PRN
  Administered 2022-02-11: 500 [IU]

## 2022-02-11 MED ORDER — SODIUM CHLORIDE 0.9 % IV SOLN
10.0000 mg | Freq: Once | INTRAVENOUS | Status: AC
  Administered 2022-02-11: 10 mg via INTRAVENOUS
  Filled 2022-02-11: qty 10

## 2022-02-11 MED ORDER — SODIUM CHLORIDE 0.9% FLUSH
10.0000 mL | INTRAVENOUS | Status: AC | PRN
  Administered 2022-02-11: 10 mL

## 2022-02-11 NOTE — Assessment & Plan Note (Signed)
09/07/2021:Screening mammogram detected left breast asymmetry posteriorly.  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, margins negative, 0/5 sentinel lymph nodes negative, ER 0%, PR 0%, HER2 negative, Ki-67 70%   Treatment plan: Adjuvant chemotherapy with Adriamycin and Cytoxan followed by Taxol started 12/03/2021 No role of adjuvant radiation or antiestrogen therapy --------------------------------------------------------------------------------------------------------------------------------------------------------------------- Current treatment: Cycle 4 dose dense Adriamycin and Cytoxan Echocardiogram 09/22/2021: EF 60 to 65%   Chemotoxicities: Mild nausea: Lasted 3 to 4 days after chemotherapy starting on day 3.  She was not taking dexamethasone after chemo.  Encouraged her to take it. Mild fatigue Chemotherapy-induced anemia: Today's hemoglobin is 11.2. Chemotherapy-induced leukopenia: Reduced the dosage for cycle 2   Breast pain: Expander has been removed and the pain is improved.   For cycle 3 onwards she is going to get these Neulasta injections with the home health nurse at her home.   Return to clinic in 2 weeks for cycle 1 Taxol

## 2022-02-11 NOTE — Patient Instructions (Signed)
[facility]  Discharge Instructions: Thank you for choosing [facility] to provide your oncology and hematology care.   If you have a lab appointment with the [facility], please go directly to the [facility] and check in at the registration area.   Wear comfortable clothing and clothing appropriate for easy access to any Portacath or PICC line.   We strive to give you quality time with your provider. You may need to reschedule your appointment if you arrive late (15 or more minutes).  Arriving late affects you and other patients whose appointments are after yours.  Also, if you miss three or more appointments without notifying the office, you may be dismissed from the clinic at the provider's discretion.      For prescription refill requests, have your pharmacy contact our office and allow 72 hours for refills to be completed.    Today you received the following chemotherapy and/or immunotherapy agents: Paclitaxel      To help prevent nausea and vomiting after your treatment, we encourage you to take your nausea medication as directed.  BELOW ARE SYMPTOMS THAT SHOULD BE REPORTED IMMEDIATELY: *FEVER GREATER THAN 100.4 F (38 C) OR HIGHER *CHILLS OR SWEATING *NAUSEA AND VOMITING THAT IS NOT CONTROLLED WITH YOUR NAUSEA MEDICATION *UNUSUAL SHORTNESS OF BREATH *UNUSUAL BRUISING OR BLEEDING *URINARY PROBLEMS (pain or burning when urinating, or frequent urination) *BOWEL PROBLEMS (unusual diarrhea, constipation, pain near the anus) TENDERNESS IN MOUTH AND THROAT WITH OR WITHOUT PRESENCE OF ULCERS (sore throat, sores in mouth, or a toothache) UNUSUAL RASH, SWELLING OR PAIN  UNUSUAL VAGINAL DISCHARGE OR ITCHING   Items with * indicate a potential emergency and should be followed up as soon as possible or go to the Emergency Department if any problems should occur.  Please show the CHEMOTHERAPY ALERT CARD or IMMUNOTHERAPY ALERT CARD at check-in to  the Emergency Department and triage nurse.  Should you have questions after your visit or need to cancel or reschedule your appointment, please contact [facility]  Dept: [phone]  and follow the prompts.  Office hours are 8:00 a.m. to 4:30 p.m. Monday - Friday. Please note that voicemails left after 4:00 p.m. may not be returned until the following business day.  We are closed weekends and major holidays. You have access to a nurse at all times for urgent questions. Please call the main number to the clinic Dept: [phone] and follow the prompts.   For any non-urgent questions, you may also contact your provider using MyChart. We now offer e-Visits for anyone 60 and older to request care online for non-urgent symptoms. For details visit [email or web address].   Also download the MyChart app! Go to the app store, search "MyChart", open the app, select [facility], and log in with your MyChart username and password.                                                                                 Paclitaxel Injection What is this medication? PACLITAXEL (PAK li TAX el) treats some types of cancer. It works by slowing down the growth of cancer cells. This medicine may be used  for other purposes; ask your health care provider or pharmacist if you have questions. COMMON BRAND NAME(S): Onxol, Taxol What should I tell my care team before I take this medication? They need to know if you have any of these conditions: Heart disease Liver disease Low white blood cell levels An unusual or allergic reaction to paclitaxel, other medications, foods, dyes, or preservatives If you or your partner are pregnant or trying to get pregnant Breast-feeding How should I use this medication? This medication is injected into a vein. It is given by your care team in a hospital or clinic setting. Talk to your care team about the use of this medication in children. While it may be  given to children for selected conditions, precautions do apply. Overdosage: If you think you have taken too much of this medicine contact a poison control center or emergency room at once. NOTE: This medicine is only for you. Do not share this medicine with others. What if I miss a dose? Keep appointments for follow-up doses. It is important not to miss your dose. Call your care team if you are unable to keep an appointment. What may interact with this medication? Do not take this medication with any of the following: Live virus vaccines Other medications may affect the way this medication works. Talk with your care team about all of the medications you take. They may suggest changes to your treatment plan to lower the risk of side effects and to make sure your medications work as intended. This list may not describe all possible interactions. Give your health care provider a list of all the medicines, herbs, non-prescription drugs, or dietary supplements you use. Also tell them if you smoke, drink alcohol, or use illegal drugs. Some items may interact with your medicine. What should I watch for while using this medication? Your condition will be monitored carefully while you are receiving this medication. You may need blood work while taking this medication. This medication may make you feel generally unwell. This is not uncommon as chemotherapy can affect healthy cells as well as cancer cells. Report any side effects. Continue your course of treatment even though you feel ill unless your care team tells you to stop. This medication can cause serious allergic reactions. To reduce the risk, your care team may give you other medications to take before receiving this one. Be sure to follow the directions from your care team. This medication may increase your risk of getting an infection. Call your care team for advice if you get a fever, chills, sore throat, or other symptoms of a cold or flu. Do not  treat yourself. Try to avoid being around people who are sick. This medication may increase your risk to bruise or bleed. Call your care team if you notice any unusual bleeding. Be careful brushing or flossing your teeth or using a toothpick because you may get an infection or bleed more easily. If you have any dental work done, tell your dentist you are receiving this medication. Talk to your care team if you may be pregnant. Serious birth defects can occur if you take this medication during pregnancy. Talk to your care team before breastfeeding. Changes to your treatment plan may be needed. What side effects may I notice from receiving this medication? Side effects that you should report to your care team as soon as possible: Allergic reactions--skin rash, itching, hives, swelling of the face, lips, tongue, or throat Heart rhythm changes--fast or irregular heartbeat, dizziness,  feeling faint or lightheaded, chest pain, trouble breathing Increase in blood pressure Infection--fever, chills, cough, sore throat, wounds that don't heal, pain or trouble when passing urine, general feeling of discomfort or being unwell Low blood pressure--dizziness, feeling faint or lightheaded, blurry vision Low red blood cell level--unusual weakness or fatigue, dizziness, headache, trouble breathing Painful swelling, warmth, or redness of the skin, blisters or sores at the infusion site Pain, tingling, or numbness in the hands or feet Slow heartbeat--dizziness, feeling faint or lightheaded, confusion, trouble breathing, unusual weakness or fatigue Unusual bruising or bleeding Side effects that usually do not require medical attention (report to your care team if they continue or are bothersome): Diarrhea Hair loss Joint pain Loss of appetite Muscle pain Nausea Vomiting This list may not describe all possible side effects. Call your doctor for medical advice about side effects. You may report side effects to FDA  at 1-800-FDA-1088. Where should I keep my medication? This medication is given in a hospital or clinic. It will not be stored at home. NOTE: This sheet is a summary. It may not cover all possible information. If you have questions about this medicine, talk to your doctor, pharmacist, or health care provider.  2023 Elsevier/Gold Standard (2021-05-20 00:00:00)

## 2022-02-12 ENCOUNTER — Encounter: Payer: Self-pay | Admitting: *Deleted

## 2022-02-12 ENCOUNTER — Telehealth: Payer: Self-pay

## 2022-02-12 NOTE — Telephone Encounter (Signed)
Julie Townsend states that she is doing fine overall.  She is experiencing some body aches and a little dizzy.  Pt is eating, drinking, and urinating well. She has her water bottle beside her. She knows to call the office at [phone] if she has any questions or concerns.

## 2022-02-12 NOTE — Telephone Encounter (Signed)
-----  Message from Kem Kays, RN sent at 02/11/2022  4:17 PM EST ----- Regarding: Dr. Lindi Adie 1st time Taxel Pt tolerated 1st. Time Paclitaxel well.

## 2022-02-16 ENCOUNTER — Telehealth: Payer: Self-pay | Admitting: Hematology and Oncology

## 2022-02-16 ENCOUNTER — Encounter: Payer: Self-pay | Admitting: Surgical

## 2022-02-16 ENCOUNTER — Ambulatory Visit (INDEPENDENT_AMBULATORY_CARE_PROVIDER_SITE_OTHER): Payer: Commercial Managed Care - HMO | Admitting: Surgical

## 2022-02-16 VITALS — BP 129/78 | HR 106

## 2022-02-16 DIAGNOSIS — Z9889 Other specified postprocedural states: Secondary | ICD-10-CM

## 2022-02-16 DIAGNOSIS — Z5111 Encounter for antineoplastic chemotherapy: Secondary | ICD-10-CM | POA: Diagnosis not present

## 2022-02-16 DIAGNOSIS — C50512 Malignant neoplasm of lower-outer quadrant of left female breast: Secondary | ICD-10-CM

## 2022-02-16 DIAGNOSIS — Z171 Estrogen receptor negative status [ER-]: Secondary | ICD-10-CM

## 2022-02-16 MED ORDER — CLINDAMYCIN HCL 150 MG PO CAPS: 450.0000 mg | ORAL_CAPSULE | Freq: Three times a day (TID) | ORAL | 0 refills | Status: AC

## 2022-02-16 NOTE — Telephone Encounter (Signed)
Cancelled appointments per 1/16 staff message. Patient is aware.

## 2022-02-16 NOTE — Progress Notes (Signed)
Referring Provider No referring provider defined for this encounter.   CC:  Chief Complaint  Patient presents with   Post-op Follow-up      Julie Townsend is an 57 y.o. female.  HPI: Patient is a 57 year old female here for follow-up on her bilateral breast reconstruction.  She has a past medical history of left-sided breast cancer status post bilateral mastectomy, immediate breast reconstruction with placement tissue expanders and Flex HD on 10/29/2021 with Dr. Marla Roe.  She subsequently underwent right expander removal due to right breast infection on 12/17/2021.  She is here today with her daughter and husband.  Of note patient had a culture taken intraoperatively on 12/17/2021 when her tissue expander was removed which was positive for Staph aureus, sensitive to Cipro, clindamycin, Bactrim.  Resistant to doxycycline.  She most recently was here in the clinic on 02/08/2022, had some tenderness to the right medial breast as well as some mild swelling and erythema.  Patient was prescribed clindamycin at that time, but she wanted to wait to start antibiotic.  Unfortunately she later developed redness and started the antibiotic on 02/10/2022.  She reports today that she has not noticed much of an improvement, she reports today that she has worsening tenderness overlying the area of redness.  The redness has spread outside the purple markings that were created by previous provider on 02/10/2022.  Of note, she is also undergoing chemotherapy, reports her next infusion is Thursday at 1:15pm.  She reports she sees Dr. Lindi Adie for this.  Patient denies any systemic symptoms, no fevers or chills, urinating normally.  Her appetite is normal.  Review of Systems General: No fevers or chills  Physical Exam    02/16/2022    1:36 PM 02/11/2022    3:55 PM 02/11/2022    3:40 PM  Vitals with BMI  Systolic [phone]  Diastolic 78 82 86  Pulse 322 90 88    General:  No acute distress,  Alert  and oriented, Non-Toxic, Normal speech and affect, family at bedside. Right breast: Right breast incision is intact and healing well, nipple areola is surgically absent.  She does have surgical ink markings medially with erythema extending outward.  The area is significantly tender and fluctuance is present.  There is cellulitic changes overlying this area. Left breast: Left breast without any erythema or cellulitic changes, incision well-healed.  Assessment/Plan 58 year old female status post bilateral breast reconstruction with subsequent removal of the right breast tissue expander due to infection.  Dr. Marla Roe was present during today's encounter and we aspirated the medial right breast and obtained approximately 5-10 cc of purulent drainage from a medial breast abscess.  Sterile technique was used.  Patient overall tolerated this well, but she did have some discomfort.  The injection site was then covered with a circular Band-Aid.  Will send drainage for culture, currently on clindamycin.  We will continue this regimen until culture results are present and will review and reevaluate at that time.  She is currently undergoing chemotherapy, will message oncology to inform them of today's visit given she has additional chemotherapy scheduled for Thursday.  I would like to see the patient back via telephone visit in 1 to 2 days to discuss how she is feeling.  Encouraged patient to use Tylenol and ibuprofen for pain control given the area is quite tender.  She knows to call with questions or concerns, pictures were taken and placed in the patient's chart with patient's permission.  Julie Townsend Julie Townsend 02/16/2022, 3:19 PM

## 2022-02-18 ENCOUNTER — Inpatient Hospital Stay: Payer: Commercial Managed Care - HMO

## 2022-02-18 ENCOUNTER — Telehealth (INDEPENDENT_AMBULATORY_CARE_PROVIDER_SITE_OTHER): Payer: Commercial Managed Care - HMO | Admitting: Surgical

## 2022-02-18 ENCOUNTER — Inpatient Hospital Stay: Payer: Commercial Managed Care - HMO | Admitting: Hematology and Oncology

## 2022-02-18 DIAGNOSIS — Z9889 Other specified postprocedural states: Secondary | ICD-10-CM

## 2022-02-18 DIAGNOSIS — Z1501 Genetic susceptibility to malignant neoplasm of breast: Secondary | ICD-10-CM

## 2022-02-18 DIAGNOSIS — C50512 Malignant neoplasm of lower-outer quadrant of left female breast: Secondary | ICD-10-CM

## 2022-02-18 NOTE — Progress Notes (Signed)
Patient is a 57 year old female who presents for virtual visit to discuss her response to antibiotics and aspiration of right medial breast abscess at her last appointment on 02/16/2022.  After her appointment on 02/16/2022, I reached out to her oncologist, recommendation was for her to reschedule her chemotherapy for this week which would have been today 02/18/2022.  She reports she is continuing to take the clindamycin, feels as if the area is improving, reports it is less full and the redness seems to have improved.  She reports that it looks as if she has more darkening of the skin than redness at this point.  She continues to deny any systemic symptoms, drinking and eating normally.  She currently has a culture pending which was sent at her last appointment, abundant staff aureus is present, no susceptibilities available yet.  I discussed with the patient that I will continue to follow the culture results and update her as soon as I receive any additional information.  I discussed with her that I would like to see her next week on Tuesday, Dr. Marla Roe will also be present and can evaluate her at that time.  We discussed to please notify us of any symptomatic changes from now until the follow-up.  We discussed that once her susceptibilities are available I will call her to discuss if medication changes are necessary.  Patient was understanding of this, she was in agreement with the current plan.  We will plan to reevaluate on Tuesday and determine if she needs any additional antibiotics, change in regimen or IV antibiotics with oncology.

## 2022-02-19 ENCOUNTER — Telehealth: Payer: Self-pay | Admitting: Surgical

## 2022-02-19 NOTE — Telephone Encounter (Signed)
57 year old female with abscess of her right medial breast/mastectomy site, she initially had expander in place, however this was removed previously due to infection.  She unfortunately presented to the office last week with an abscess of the medial right breast, this was drained and the culture has resulted as Staph aureus, pansensitive, however resistant to tetracyclines.  She is currently on clindamycin and is tolerating this well.  We will continue this and follow-up with her Tuesday for reevaluation and to discuss possible need for IV antibiotics.  She knows to call with questions or concerns or if her symptoms change or worsen over the weekend.

## 2022-02-21 LAB — AEROBIC/ANAEROBIC CULTURE W GRAM STAIN (SURGICAL/DEEP WOUND)

## 2022-02-23 ENCOUNTER — Other Ambulatory Visit: Payer: Self-pay | Admitting: Hematology and Oncology

## 2022-02-23 ENCOUNTER — Ambulatory Visit (INDEPENDENT_AMBULATORY_CARE_PROVIDER_SITE_OTHER): Payer: Commercial Managed Care - HMO | Admitting: Surgical

## 2022-02-23 DIAGNOSIS — Z9889 Other specified postprocedural states: Secondary | ICD-10-CM

## 2022-02-23 DIAGNOSIS — C50512 Malignant neoplasm of lower-outer quadrant of left female breast: Secondary | ICD-10-CM

## 2022-02-23 DIAGNOSIS — Z1509 Genetic susceptibility to other malignant neoplasm: Secondary | ICD-10-CM

## 2022-02-23 DIAGNOSIS — Z1501 Genetic susceptibility to malignant neoplasm of breast: Secondary | ICD-10-CM

## 2022-02-23 DIAGNOSIS — Z171 Estrogen receptor negative status [ER-]: Secondary | ICD-10-CM

## 2022-02-23 NOTE — Progress Notes (Signed)
   Referring Provider No referring provider defined for this encounter.   CC:  Chief Complaint  Patient presents with   Post-op Follow-up      Julie Townsend is an 57 y.o. female.  HPI: Patient is a 57 year old female here for follow-up on her bilateral breast reconstruction, she most recently was seen in the office on 02/16/2022 for evaluation of right medial breast pain and swelling.  At that time we drained an abscess which almost immediately improved her pain.  Cultures were sent which grew staph aureus -resistant to tetracyclines.  She was on clindamycin which she finished yesterday.  She was scheduled for chemotherapy last week on Thursday, however this was postponed due to the infection.  She and her been have questions today about if chemotherapy will need to be postponed this week or not.  Her husband is also concerned about the possibility of how long chemo can be delayed.  Patient reports she is feeling much better today, she is only having very minimal pain along the medial aspect of her right breast, significantly less tender than last week.  She is not having any infectious symptoms.  She has completed her clindamycin.   Review of Systems General: No fevers or chills  Physical Exam    02/16/2022    1:36 PM 02/11/2022    3:55 PM 02/11/2022    3:40 PM  Vitals with BMI  Systolic 275 170 017  Diastolic 78 82 86  Pulse 494 90 88    General:  No acute distress,  Alert and oriented, Non-Toxic, Normal speech and affect Breast: Right breast incision is intact, Port-A-Cath in place superior to right mastectomy.  She does have some medial skin changes and sensitivity still present, however significantly improved.  No fluctuance or fluid collection noted with palpation.  Left breast incision is intact and healing well, expander is in place.  Assessment/Plan 57 year old female status post bilateral breast reconstruction with subsequent removal of the right breast tissue  expander, most recently had a small abscess of the medial right breast that was drained in the office and sent for culture.  Culture positive for staph, pansensitive, however resistant to tetracycline.  She has completed her antibiotics, which she has been on for 2 weeks.  We will plan to hold any additional antibiotics at this time given the area is significantly improved.  She is aware to continue to monitor the area for increased pain, redness, swelling or any other concerns and we can certainly restart antibiotics or discussed need for IV antibiotics through her port at that time.  I reached out to oncology to notify them of today's appointment and notify them of patient's questions about postponing chemotherapy or having chemotherapy this week.  I appreciate their help and assistance.   We will plan to see her back for follow-up in 1 week.  Julie Townsend 02/23/2022, 11:43 AM

## 2022-02-24 ENCOUNTER — Telehealth: Payer: Self-pay | Admitting: Hematology and Oncology

## 2022-02-24 ENCOUNTER — Other Ambulatory Visit: Payer: Self-pay | Admitting: *Deleted

## 2022-02-24 DIAGNOSIS — Z171 Estrogen receptor negative status [ER-]: Secondary | ICD-10-CM

## 2022-02-24 MED FILL — Dexamethasone Sodium Phosphate Inj 100 MG/10ML: INTRAMUSCULAR | Qty: 1 | Status: AC

## 2022-02-24 NOTE — Telephone Encounter (Signed)
Scheduled appointments per WQ. Patient is aware of all made appointments. 

## 2022-02-25 ENCOUNTER — Inpatient Hospital Stay: Payer: Commercial Managed Care - HMO | Admitting: Adult Health

## 2022-02-25 ENCOUNTER — Inpatient Hospital Stay: Payer: Commercial Managed Care - HMO

## 2022-03-02 ENCOUNTER — Ambulatory Visit (INDEPENDENT_AMBULATORY_CARE_PROVIDER_SITE_OTHER): Payer: Commercial Managed Care - HMO | Admitting: Surgical

## 2022-03-02 DIAGNOSIS — Z1509 Genetic susceptibility to other malignant neoplasm: Secondary | ICD-10-CM

## 2022-03-02 DIAGNOSIS — Z9889 Other specified postprocedural states: Secondary | ICD-10-CM

## 2022-03-02 DIAGNOSIS — Z171 Estrogen receptor negative status [ER-]: Secondary | ICD-10-CM

## 2022-03-02 NOTE — Progress Notes (Signed)
Patient is a 57 year old female here for virtual visit to discuss her right breast.  She has a history of staph infection to the right breast which required expander removal, subsequently developed a medial right breast abscess which was drained in the office.  She was placed on antibiotics which she has been off of for approximately 1 week.  She has discussed with oncology, and is scheduled to return to scheduled chemotherapy next week 03/11/2022.  She has had a 3-week sabbatical from chemo due to the staph infection.  She reports she is doing much better, reports the darkening of the skin is still present, however she does not have much pain, she is overall feeling well, eating and drinking normally.  She has not noticed any additional swelling in that area.  The patient gave consent to have this visit done by telemedicine / virtual visit, two identifiers were used to identify patient. This is also consent for access the chart and treat the patient via this visit. The patient is located in [place].  I, the provider, am at the office.  We spent 5 minutes together for the visit.  Joined by phone.  Recommend continuing to monitor the area, we will plan to see her next week to evaluate the right breast prior to returning to chemotherapy on 03/11/2022.  Recommend calling with any questions or concerns.  I am glad she is feeling much better.

## 2022-03-04 ENCOUNTER — Inpatient Hospital Stay: Payer: Commercial Managed Care - HMO

## 2022-03-04 ENCOUNTER — Inpatient Hospital Stay: Payer: Commercial Managed Care - HMO | Admitting: Adult Health

## 2022-03-04 ENCOUNTER — Other Ambulatory Visit: Payer: Self-pay | Admitting: Neurology

## 2022-03-06 ENCOUNTER — Telehealth (INDEPENDENT_AMBULATORY_CARE_PROVIDER_SITE_OTHER): Payer: Commercial Managed Care - HMO | Admitting: Physician Assistant

## 2022-03-06 DIAGNOSIS — Z9889 Other specified postprocedural states: Secondary | ICD-10-CM

## 2022-03-06 MED ORDER — CIPROFLOXACIN HCL 500 MG PO TABS: 500.0000 mg | ORAL_TABLET | Freq: Two times a day (BID) | ORAL | 0 refills | Status: DC

## 2022-03-06 NOTE — Telephone Encounter (Signed)
Patient is a pleasant 57 year old female with PMH of left-sided breast cancer s/p bilateral mastectomy with immediate reconstruction using tissue expander and Flex HD performed 10/29/2021 and subsequent right-sided infection and expander removal 12/17/2021 by Dr. Marla Roe who called into the on-call service today with concerns for infection.  She has developed a recurrence of her right sided infection since time of expander removal and washout.  Recent culture from aspiration in office revealed staph aureus sensitive to multiple classes of antibiotics.  She has just recently been treated with Bactrim.   Patient tells me that she had improvement since antibiotics and aspiration, but that is feeling swollen again and is exquisitely tender.  She continues to deny any systemic symptoms, but is understandably concerned for recurrence.  She also is concerned given that her oncologist has already held the past three chemotherapy treatments given her infection.  She is eager to resume.  Will prescribe ciprofloxacin given that she just recently completed Bactrim.  She will call the clinic if she develops any or new or worsening symptoms, otherwise plan for her to come to the office in three days, as scheduled.  May need to consider alternative management given her recurrence. Patient voices understanding and is agreeable to the plan.

## 2022-03-08 ENCOUNTER — Encounter: Payer: Self-pay | Admitting: *Deleted

## 2022-03-09 ENCOUNTER — Encounter: Payer: Self-pay | Admitting: Adult Health

## 2022-03-09 ENCOUNTER — Inpatient Hospital Stay: Payer: Commercial Managed Care - HMO | Admitting: Adult Health

## 2022-03-09 ENCOUNTER — Encounter: Payer: Self-pay | Admitting: Surgical

## 2022-03-09 ENCOUNTER — Telehealth: Payer: Self-pay

## 2022-03-09 ENCOUNTER — Inpatient Hospital Stay: Payer: Commercial Managed Care - HMO | Attending: Hematology and Oncology

## 2022-03-09 ENCOUNTER — Other Ambulatory Visit: Payer: Self-pay

## 2022-03-09 ENCOUNTER — Ambulatory Visit (INDEPENDENT_AMBULATORY_CARE_PROVIDER_SITE_OTHER): Payer: Commercial Managed Care - HMO | Admitting: Surgical

## 2022-03-09 VITALS — BP 128/82 | HR 84 | Temp 97.3°F | Resp 18 | Wt 118.1 lb

## 2022-03-09 VITALS — BP 153/90 | HR 84

## 2022-03-09 DIAGNOSIS — C50512 Malignant neoplasm of lower-outer quadrant of left female breast: Secondary | ICD-10-CM | POA: Diagnosis not present

## 2022-03-09 DIAGNOSIS — Z1501 Genetic susceptibility to malignant neoplasm of breast: Secondary | ICD-10-CM | POA: Insufficient documentation

## 2022-03-09 DIAGNOSIS — Z9889 Other specified postprocedural states: Secondary | ICD-10-CM

## 2022-03-09 DIAGNOSIS — Z8041 Family history of malignant neoplasm of ovary: Secondary | ICD-10-CM | POA: Insufficient documentation

## 2022-03-09 DIAGNOSIS — N61 Mastitis without abscess: Secondary | ICD-10-CM | POA: Diagnosis not present

## 2022-03-09 DIAGNOSIS — T451X5A Adverse effect of antineoplastic and immunosuppressive drugs, initial encounter: Secondary | ICD-10-CM | POA: Diagnosis not present

## 2022-03-09 DIAGNOSIS — Z7952 Long term (current) use of systemic steroids: Secondary | ICD-10-CM | POA: Insufficient documentation

## 2022-03-09 DIAGNOSIS — Z171 Estrogen receptor negative status [ER-]: Secondary | ICD-10-CM | POA: Diagnosis not present

## 2022-03-09 DIAGNOSIS — Z5111 Encounter for antineoplastic chemotherapy: Secondary | ICD-10-CM | POA: Insufficient documentation

## 2022-03-09 DIAGNOSIS — D6481 Anemia due to antineoplastic chemotherapy: Secondary | ICD-10-CM | POA: Insufficient documentation

## 2022-03-09 DIAGNOSIS — Z803 Family history of malignant neoplasm of breast: Secondary | ICD-10-CM | POA: Insufficient documentation

## 2022-03-09 DIAGNOSIS — Z9011 Acquired absence of right breast and nipple: Secondary | ICD-10-CM | POA: Diagnosis not present

## 2022-03-09 DIAGNOSIS — Z9013 Acquired absence of bilateral breasts and nipples: Secondary | ICD-10-CM | POA: Diagnosis not present

## 2022-03-09 DIAGNOSIS — Z9221 Personal history of antineoplastic chemotherapy: Secondary | ICD-10-CM | POA: Insufficient documentation

## 2022-03-09 DIAGNOSIS — Z800 Family history of malignant neoplasm of digestive organs: Secondary | ICD-10-CM | POA: Diagnosis not present

## 2022-03-09 DIAGNOSIS — Z95828 Presence of other vascular implants and grafts: Secondary | ICD-10-CM

## 2022-03-09 MED ORDER — OXYCODONE HCL 5 MG PO TABS: 5.0000 mg | ORAL_TABLET | Freq: Three times a day (TID) | ORAL | 0 refills | Status: AC | PRN

## 2022-03-09 MED ORDER — AMOXICILLIN-POT CLAVULANATE 875-125 MG PO TABS: 1.0000 | ORAL_TABLET | Freq: Two times a day (BID) | ORAL | 0 refills | Status: DC

## 2022-03-09 MED ORDER — SODIUM CHLORIDE 0.9 % IV SOLN: INTRAVENOUS | Status: DC

## 2022-03-09 MED ORDER — VANCOMYCIN HCL 1000 MG IV SOLR
1000.0000 mg | Freq: Once | INTRAVENOUS | Status: AC
  Administered 2022-03-09: 1000 mg via INTRAVENOUS
  Filled 2022-03-09: qty 20

## 2022-03-09 MED ORDER — VANCOMYCIN HCL 10 G IV SOLR: 2000.0000 mg | Freq: Once | INTRAVENOUS | Status: DC

## 2022-03-09 NOTE — H&P (View-Only) (Signed)
Referring Provider No referring provider defined for this encounter.   CC:  Chief Complaint  Patient presents with   Post-op Follow-up      Julie Townsend is an 57 y.o. female.  HPI: Patient is a 57 year old female here for follow-up on her breast reconstruction.  She has a history of staph infection to the right breast which required expander removal and drain placement on 12/17/2021 with Dr. Marla Roe. She was on antibiotics preoperatively with Bactrim and then postoperatively for 5 days with clindamycin.  She was overall doing well until she noticed some increased tenderness and skin changes to the right medial breast on 02/08/2022.  She was prescribed clindamycin 450 mg 3 times daily, but did not start the prescription until 02/10/2022 as it was difficult to determine if the skin changes were related to trauma from being accidentally kicked by her grandson versus infection.  The area subsequently worsened and she developed medial right breast cellulitis with an underlying fluid collection which was drained in the office by myself and Dr. Marla Roe on 02/16/2022.  Patient's clindamycin was extended for an additional 5 days. Culture grew Staphylococcus aureus, pansensitive with the exception of tetracycline.  Patient was reevaluated on 02/23/2022, she was doing much better, recommendation to hold off on any additional antibiotics at this time given the significant improvement.  She had a total of 2 weeks of clindamycin at this point, 02/10/2022 until 02/23/2022.  Of note, chemotherapy has been postponed at this point due to the infection.  She called the office this past weekend, spoke with on-call provider.  She had noticed increased swelling again, increased tenderness.  She was placed on ciprofloxacin on 03/06/2022, recommended for early follow-up this week.  She presents today, reports a lot of pain to the medial right breast, feels as if the area may be slightly improving after  starting the Cipro on Saturday, 03/06/2022.  She is here today with her husband, they are concerned about ongoing delay of her chemotherapy treatments and how this will affect her risk of recurrence.  She denies any systemic symptoms, not having any fevers or chills.  Review of Systems General: No fevers or chills  Physical Exam    03/09/2022    9:35 AM 02/16/2022    1:36 PM 02/11/2022    3:55 PM  Vitals with BMI  Systolic 0000000 [id number]  Diastolic 90 78 82  Pulse 84 106 90    General:  No acute distress,  Alert and oriented, Non-Toxic, Normal speech and affect.  Visually appears to be in pain.  Right breast: Right breast with significant tenderness medially extending to the medial breast, erythema and cellulitic changes changes noted, fluctuance noted with palpation of medial right breast.  No active drainage noted.  Mastectomy flaps are viable.  Port-A-Cath noted above right mastectomy surgical site.  Left breast: Left breast incision intact, expander in place, no erythema or cellulitic changes noted.  Assessment/Plan Patient is a 57 year old female here for follow-up after bilateral breast reconstruction, she subsequently had the right breast tissue expander removed due to infection, subsequently developed infected fluid collection of the right medial breast which was drained in the office, cultures showed Staphylococcus aureus, pansensitive with the exception of tetracyclines.  Today she has an additional fluid collection with fluctuance and surrounding cellulitic changes, 5 cc of purulent drainage was aspirated from the medial right breast today, patient has some discomfort with needle aspiration but overall tolerated it well. The culture was not sent for pathology  given recent cultures.   I have reached out to Dr. Marla Roe to notify her of patient's encounter today, discussed with her the physical exam findings and we discussed plan moving forward.  We discussed IV antibiotics through her  port, I reached out to oncology for their assistance with arranging IV antibiotics.  We also discussed possibility for returning to the operating room next week for drainage and washout.  Dr. Marla Roe planning to discuss further with infectious disease for recommendations on IV antibiotics.  We will plan to evaluate the patient later this week, I will call patient to discuss plan moving forward.  I would like to see the patient back on Friday, 03/12/2022. Appreciate oncology's assistance with arranging IV antibiotics.  Pictures were obtained of the patient and placed in the chart with the patient's or guardian's permission.    Julie Townsend 03/09/2022, 10:05 AM

## 2022-03-09 NOTE — Patient Instructions (Signed)
Vancomycin Injection What is this medication? VANCOMYCIN Lucianne Lei koe MYE sin) treats infections caused by bacteria. It belongs to a group of medications called antibiotics. It will not treat colds, the flu, or infections caused by viruses. This medicine may be used for other purposes; ask your health care provider or pharmacist if you have questions. COMMON BRAND NAME(S): Vancocin, Vancocin Powder, VANCOSOL What should I tell my care team before I take this medication? They need to know if you have any of these conditions: Hearing loss or problems Kidney disease Stomach or intestine problems, such as colitis An unusual or allergic reaction to vancomycin, other medications, foods, dyes, or preservatives Pregnant or trying to get pregnant Breast-feeding How should I use this medication? This medication is infused into a vein. It is usually given by your care team in a hospital or clinic setting. It may also be given at home. If you get this medication at home, you will be taught how to prepare and give it. Use exactly as directed. Take it as directed on the prescription label at the same time every day. Take all of this medication unless your care team tells you to stop it early. Keep taking it even if you think you are better. It is important that you put your used needles and syringes in a special sharps container. Do not put them in a trash can. If you do not have a sharps container, call your pharmacist or care team to get one. Talk to your care team about the use of this medication in children. While it may be prescribed for infants for selected conditions, precautions do apply. Overdosage: If you think you have taken too much of this medicine contact a poison control center or emergency room at once. NOTE: This medicine is only for you. Do not share this medicine with others. What if I miss a dose? If you get this medication at the hospital or clinic: It is important not to miss your dose. Call  your care team if you are unable to keep an appointment. If you give yourself this medication at home: If you miss a dose, take it as soon as you can. If it is almost time for your next dose, take only that dose. Do not take double or extra doses. Call your care team with questions. What may interact with this medication? Do not take this medication with any of the following: Cidofovir This medication may also interact with the following: Amphotericin B Bacitracin Certain antibiotics, such as tobramycin Cisplatin Colistin Polymyxin B Viomycin This list may not describe all possible interactions. Give your health care provider a list of all the medicines, herbs, non-prescription drugs, or dietary supplements you use. Also tell them if you smoke, drink alcohol, or use illegal drugs. Some items may interact with your medicine. What should I watch for while using this medication? Your condition will be monitored carefully while you are receiving this medication. Tell your care team if your symptoms do not start to get better or if they get worse. Do not treat diarrhea with over the counter products. Contact your care team if you have diarrhea that lasts more than 2 days or if it is severe and watery. This medication may cause serious skin reactions. They can happen weeks to months after starting the medication. Contact your care team right away if you notice fevers or flu-like symptoms with a rash. The rash may be red or purple and then turn into blisters or peeling of  the skin. You may also notice a red rash with swelling of the face, lips, or lymph nodes in your neck or under your arms. What side effects may I notice from receiving this medication? Side effects that you should report to your care team as soon as possible: Allergic reactions--skin rash, itching, hives, swelling of the face, lips, tongue, or throat Flushing, mostly over the face, neck, and chest, during injection Hearing loss,  ringing in ears Infusion reactions--chest pain, shortness of breath or trouble breathing, feeling faint or lightheaded Kidney injury--decrease in the amount of urine, swelling of the ankles, hands, or feet Redness, blistering, peeling, or loosening of the skin, including inside the mouth Severe diarrhea, fever Unusual vaginal discharge, itching, or odor Side effects that usually do not require medical attention (report to your care team if they continue or are bothersome): Diarrhea Nausea Pain, redness, or irritation at injection site Stomach cramping This list may not describe all possible side effects. Call your doctor for medical advice about side effects. You may report side effects to FDA at 1-800-FDA-1088. Where should I keep my medication? Keep out of the reach of children and pets. You will be instructed on how to store this medication. Get rid of any unused medication after the expiration date. To get rid of medications that are no longer needed or have expired: Take the medication to a medication take-back program. Check with your pharmacy or law enforcement to find a location. If you cannot return the medication, ask your pharmacist or care team how to get rid of this medication safely. NOTE: This sheet is a summary. It may not cover all possible information. If you have questions about this medicine, talk to your doctor, pharmacist, or health care provider.  2023 Elsevier/Gold Standard (2021-04-15 00:00:00)

## 2022-03-09 NOTE — Progress Notes (Signed)
 Referring Provider No referring provider defined for this encounter.   CC:  Chief Complaint  Patient presents with   Post-op Follow-up      Julie Townsend is an 56 y.o. female.  HPI: Patient is a 56-year-old female here for follow-up on her breast reconstruction.  She has a history of staph infection to the right breast which required expander removal and drain placement on 12/17/2021 with Dr. Dillingham. She was on antibiotics preoperatively with Bactrim and then postoperatively for 5 days with clindamycin.  She was overall doing well until she noticed some increased tenderness and skin changes to the right medial breast on 02/08/2022.  She was prescribed clindamycin 450 mg 3 times daily, but did not start the prescription until 02/10/2022 as it was difficult to determine if the skin changes were related to trauma from being accidentally kicked by her grandson versus infection.  The area subsequently worsened and she developed medial right breast cellulitis with an underlying fluid collection which was drained in the office by myself and Dr. Dillingham on 02/16/2022.  Patient's clindamycin was extended for an additional 5 days. Culture grew Staphylococcus aureus, pansensitive with the exception of tetracycline.  Patient was reevaluated on 02/23/2022, she was doing much better, recommendation to hold off on any additional antibiotics at this time given the significant improvement.  She had a total of 2 weeks of clindamycin at this point, 02/10/2022 until 02/23/2022.  Of note, chemotherapy has been postponed at this point due to the infection.  She called the office this past weekend, spoke with on-call provider.  She had noticed increased swelling again, increased tenderness.  She was placed on ciprofloxacin on 03/06/2022, recommended for early follow-up this week.  She presents today, reports a lot of pain to the medial right breast, feels as if the area may be slightly improving after  starting the Cipro on Saturday, 03/06/2022.  She is here today with her husband, they are concerned about ongoing delay of her chemotherapy treatments and how this will affect her risk of recurrence.  She denies any systemic symptoms, not having any fevers or chills.  Review of Systems General: No fevers or chills  Physical Exam    03/09/2022    9:35 AM 02/16/2022    1:36 PM 02/11/2022    3:55 PM  Vitals with BMI  Systolic 153 129 114  Diastolic 90 78 82  Pulse 84 106 90    General:  No acute distress,  Alert and oriented, Non-Toxic, Normal speech and affect.  Visually appears to be in pain.  Right breast: Right breast with significant tenderness medially extending to the medial breast, erythema and cellulitic changes changes noted, fluctuance noted with palpation of medial right breast.  No active drainage noted.  Mastectomy flaps are viable.  Port-A-Cath noted above right mastectomy surgical site.  Left breast: Left breast incision intact, expander in place, no erythema or cellulitic changes noted.  Assessment/Plan Patient is a 56-year-old female here for follow-up after bilateral breast reconstruction, she subsequently had the right breast tissue expander removed due to infection, subsequently developed infected fluid collection of the right medial breast which was drained in the office, cultures showed Staphylococcus aureus, pansensitive with the exception of tetracyclines.  Today she has an additional fluid collection with fluctuance and surrounding cellulitic changes, 5 cc of purulent drainage was aspirated from the medial right breast today, patient has some discomfort with needle aspiration but overall tolerated it well. The culture was not sent for pathology   given recent cultures.   I have reached out to Dr. Dillingham to notify her of patient's encounter today, discussed with her the physical exam findings and we discussed plan moving forward.  We discussed IV antibiotics through her  port, I reached out to oncology for their assistance with arranging IV antibiotics.  We also discussed possibility for returning to the operating room next week for drainage and washout.  Dr. Dillingham planning to discuss further with infectious disease for recommendations on IV antibiotics.  We will plan to evaluate the patient later this week, I will call patient to discuss plan moving forward.  I would like to see the patient back on Friday, 03/12/2022. Appreciate oncology's assistance with arranging IV antibiotics.  Pictures were obtained of the patient and placed in the chart with the patient's or guardian's permission.    [name] 03/09/2022, 10:05 AM        

## 2022-03-09 NOTE — Assessment & Plan Note (Addendum)
Julie Townsend is here today for follow-up and evaluation of her breast infection and to discuss IV antibiotics.  I reviewed this case with Dr. Tommy Medal in infectious disease who let me know that he did not think IV antibiotics were necessary since she had a soft tissue infection that was sensitive to antibiotics.  He recommended that she get in with one of the infectious disease physicians to discuss further however thought that further washout of the surgical field may be necessary.  I changed her oral antibiotics from Cipro to Augmentin and she met with Dr. Lindi Adie to review everything and the potential impact treatment delays may have on her outcomes.  Dr. Lindi Adie reassured [name] that she is 65% finished with her chemotherapy and has received the backbone of her therapy.  After hearing about the purulent drainage that was obtained from her surgical field he did want her to receive a one-time IV Vanco dose today while she is here today which was ordered.    She will follow-up with infectious disease for further evaluation and management of her breast infection, tomorrow and she will see plastic surgery later this week.  We will see her back on 03/18/2021  for labs, f/u, and possibly treatment.

## 2022-03-09 NOTE — Telephone Encounter (Signed)
Patient is aware of new appointments for today to see Wilber Bihari NP at 1:45pm and infusion at 2:30pm for Vanc.

## 2022-03-09 NOTE — Progress Notes (Addendum)
Julie Townsend Follow up:    Pcp, No No address on file   DIAGNOSIS:  Townsend Staging  Malignant neoplasm of lower-outer quadrant of left breast of female, estrogen receptor negative ([place]) Staging form: Breast, AJCC 8th Edition - Clinical stage from 09/14/2021: Stage IB (cT1c, cN0, cM0, G3, ER-, PR-, HER2-) - Signed by Hayden Pedro, PA-C on 09/14/2021 Stage prefix: Initial diagnosis Method of lymph node assessment: Clinical Histologic grading system: 3 grade system   SUMMARY OF ONCOLOGIC HISTORY: Oncology History  Malignant neoplasm of lower-outer quadrant of left breast of female, estrogen receptor negative ([place])  09/07/2021 Initial Diagnosis   Screening mammogram detected left breast asymmetry posteriorly.  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, Ki-67 70%   09/14/2021 Townsend Staging   Staging form: Breast, AJCC 8th Edition - Clinical stage from 09/14/2021: Stage IB (cT1c, cN0, cM0, G3, ER-, PR-, HER2-) - Signed by Hayden Pedro, PA-C on 09/14/2021 Stage prefix: Initial diagnosis Method of lymph node assessment: Clinical Histologic grading system: 3 grade system   09/26/2021 Genetic Testing   Pathogenic variant in BRCA2 at p.D6644* (c.3922G>T).  Report date is September 26, 2021 ([place]) and September 29, 2021 (expanded).    The CancerNext-Expanded gene panel offered by [facility] and includes sequencing, rearrangement, and RNA analysis for the following 77 genes: AIP, ALK, APC, ATM, AXIN2, BAP1, BARD1, BLM, BMPR1A, BRCA1, BRCA2, BRIP1, CDC73, CDH1, CDK4, CDKN1B, CDKN2A, CHEK2, CTNNA1, DICER1, FANCC, FH, FLCN, GALNT12, KIF1B, LZTR1, MAX, MEN1, MET, MLH1, MSH2, MSH3, MSH6, MUTYH, NBN, NF1, NF2, NTHL1, PALB2, PHOX2B, PMS2, POT1, PRKAR1A, PTCH1, PTEN, RAD51C, RAD51D, RB1, RECQL, RET, SDHA, SDHAF2, SDHB, SDHC, SDHD, SMAD4, SMARCA4, SMARCB1, SMARCE1, STK11, SUFU, TMEM127,  TP53, TSC1, TSC2, VHL and XRCC2 (sequencing and deletion/duplication); EGFR, EGLN1, HOXB13, KIT, MITF, PDGFRA, POLD1, and POLE (sequencing only); EPCAM and GREM1 (deletion/duplication only).    10/29/2021 Surgery   Bilateral mastectomies Right mastectomy: Benign, PASH Left mastectomy: Grade 3 IDC with DCIS 1.2 cm, margins negative, 0/5 sentinel lymph nodes negative, ER 0%, PR 0%, HER2 negative, Ki-67 70%   12/03/2021 -  Chemotherapy   Patient is on Treatment Plan : BREAST ADJUVANT DOSE DENSE AC q14d / PACLitaxel q7d       CURRENT THERAPY: Chemotherapy currently on hold  INTERVAL HISTORY: Julie Townsend 56 y.o. female returns for follow-up to discuss her breast infection and whether IV antibiotics would be beneficial.  She underwent bilateral breast mastectomies with reconstruction with Dr. Marla Roe.  Since that time she has required several courses of oral antibiotics for a right breast infection and she is status post expander removal and washout of that area as well.  She has received 4 cycles of Adriamycin and Cytoxan and had began on Taxol however this infection has slow the progress of her chemotherapy.    She has been on clindamycin, Cipro, Bactrim, has undergone a washout, and has undergone abscess drainage x 3--most recently today.  Her most recent cultures grew out Staph aureus that is only resistant to tetracycline antibiotics.   Patient Active Problem List   Diagnosis Date Noted   Breast Townsend ([place]) 10/29/2021   BRCA2 gene mutation positive 09/26/2021   Genetic testing 09/26/2021   Family history of breast Townsend 09/22/2021   Family history of pancreatic Townsend 09/22/2021   Family history of ovarian Townsend 09/22/2021   Malignant neoplasm of lower-outer quadrant of left breast of female,  estrogen receptor negative ([place]) 09/11/2021    has No Known Allergies.  MEDICAL HISTORY: Past Medical History:  Diagnosis Date   BRCA2 gene mutation positive 09/26/2021   Breast  Townsend ([place]) 09/2021   left breast IDC   Family history of breast Townsend 09/22/2021   Family history of ovarian Townsend 09/22/2021   Family history of pancreatic Townsend 09/22/2021   Genetic testing 09/26/2021   Pathogenic variant in BRCA2 at p.G8676* (c.3922G>T).  Report date is September 26, 2021.    The BRCAplus panel offered by [employer] and includes sequencing and deletion/duplication analysis for the following 8 genes: ATM, BRCA1, BRCA2, CDH1, CHEK2, PALB2, PTEN, and TP53.  Results of pan-Townsend panel are pending.     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    correct Hands    SURGICAL HISTORY: Past Surgical History:  Procedure Laterality Date   BREAST IMPLANT REMOVAL Right 12/17/2021   Procedure: Right breast expander removal with washout;  Surgeon: Wallace Going, DO;  Location: [place];  Service: Plastics;  Laterality: Right;   BREAST RECONSTRUCTION WITH PLACEMENT OF TISSUE EXPANDER AND FLEX HD (ACELLULAR HYDRATED DERMIS) Bilateral 10/29/2021   Procedure: BREAST RECONSTRUCTION WITH PLACEMENT OF TISSUE EXPANDER AND FLEX HD (ACELLULAR HYDRATED DERMIS);  Surgeon: Wallace Going, DO;  Location: [place];  Service: Plastics;  Laterality: Bilateral;   CARPAL TUNNEL RELEASE Right 06/20/2020   Procedure: CARPAL TUNNEL RELEASE;  Surgeon: [name], MD;  Location: [place];  Service: Orthopedics;  Laterality: Right;   MASTECTOMY W/ SENTINEL NODE BIOPSY Left 10/29/2021   Procedure: LEFT MASTECTOMY WITH LEFT SENTINEL LYMPH NODE BIOPSY;  Surgeon: [name], MD;  Location: [place];  Service: General;  Laterality: Left;   PORTACATH PLACEMENT Right 10/29/2021   Procedure: INSERTION PORT-A-CATH;  Surgeon: [name], MD;  Location: [place];  Service: General;  Laterality: Right;   TOTAL MASTECTOMY Right 10/29/2021   Procedure: RIGHT TOTAL MASTECTOMY;  Surgeon:  [name], MD;  Location: [place];  Service: General;  Laterality: Right;   TRIGGER FINGER RELEASE Left 05/08/2020   Procedure: LEFT LONG FINGER TRIGGER RELEASE AND LEFT RING FINGER TRIGGER RELEASE;  Surgeon: [name], MD;  Location: [place];  Service: Orthopedics;  Laterality: Left;   trigger finger release rt hand      SOCIAL HISTORY: Social History   Socioeconomic History   Marital status: Married    Spouse name: Not on file   Number of children: Not on file   Years of education: Not on file   Highest education level: Not on file  Occupational History   Not on file  Tobacco Use   Smoking status: Never   Smokeless tobacco: Never  Vaping Use   Vaping Use: Never used  Substance and Sexual Activity   Alcohol use: Never   Drug use: Never   Sexual activity: Yes    Birth control/protection: Post-menopausal  Other Topics Concern   Not on file  Social History Narrative   Lives with husband.   Right handed   Social Determinants of Health   Financial Resource Strain: Not on file  Food Insecurity: Not on file  Transportation Needs: Not on file  Physical Activity: Not on file  Stress: Not on file  Social Connections: Not on file  Intimate Partner Violence: Not on file    FAMILY HISTORY: Family History  Problem Relation Age of Onset  Migraines Mother    Breast Townsend Mother 74       recurr at  89   Migraines Maternal Aunt    Colon Townsend Maternal Uncle        dx late 60s   Pancreatic Townsend Maternal Uncle 28   Migraines Maternal Grandmother    Ovarian Townsend Maternal Grandmother        dx 31s    Review of Systems  Constitutional:  Negative for appetite change, chills, fatigue, fever and unexpected weight change.  HENT:   Negative for hearing loss, lump/mass and trouble swallowing.   Eyes:  Negative for eye problems and icterus.  Respiratory:  Negative for chest tightness, cough and shortness of breath.    Cardiovascular:  Negative for chest pain, leg swelling and palpitations.  Gastrointestinal:  Negative for abdominal distention, abdominal pain, constipation, diarrhea, nausea and vomiting.  Endocrine: Negative for hot flashes.  Genitourinary:  Negative for difficulty urinating.   Musculoskeletal:  Negative for arthralgias.  Skin:  Negative for itching and rash.  Neurological:  Negative for dizziness, extremity weakness, headaches and numbness.  Hematological:  Negative for adenopathy. Does not bruise/bleed easily.  Psychiatric/Behavioral:  Negative for depression. The patient is not nervous/anxious.       PHYSICAL EXAMINATION  ECOG PERFORMANCE STATUS: 1 - Symptomatic but completely ambulatory  Vitals:   03/09/22 1350  BP: 128/82  Pulse: 84  Resp: 18  Temp: (!) 97.3 F (36.3 C)  SpO2: 100%    Physical Exam Constitutional:      General: She is not in acute distress.    Appearance: Normal appearance. She is not toxic-appearing.  HENT:     Head: Normocephalic and atraumatic.  Eyes:     General: No scleral icterus. Cardiovascular:     Rate and Rhythm: Normal rate and regular rhythm.     Pulses: Normal pulses.     Heart sounds: Normal heart sounds.  Pulmonary:     Effort: Pulmonary effort is normal.     Breath sounds: Normal breath sounds.  Abdominal:     General: Abdomen is flat. Bowel sounds are normal. There is no distension.     Palpations: Abdomen is soft.     Tenderness: There is no abdominal tenderness.  Musculoskeletal:        General: No swelling.     Cervical back: Neck supple.  Lymphadenopathy:     Cervical: No cervical adenopathy.  Skin:    General: Skin is warm and dry.     Findings: No rash.  Neurological:     General: No focal deficit present.     Mental Status: She is alert.  Psychiatric:        Mood and Affect: Mood normal.        Behavior: Behavior normal.     LABORATORY DATA:  CBC    Component Value Date/Time   WBC 10.2 02/11/2022 1107    RBC 2.81 (L) 02/11/2022 1107   HGB 9.3 (L) 02/11/2022 1107   HCT 27.6 (L) 02/11/2022 1107   PLT 301 02/11/2022 1107   MCV 98.2 02/11/2022 1107   MCH 33.1 02/11/2022 1107   MCHC 33.7 02/11/2022 1107   RDW 18.4 (H) 02/11/2022 1107   LYMPHSABS 1.3 02/11/2022 1107   MONOABS 0.9 02/11/2022 1107   EOSABS 0.0 02/11/2022 1107   BASOSABS 0.1 02/11/2022 1107    CMP     Component Value Date/Time   NA 139 02/11/2022 1107   K 3.7 02/11/2022 1107  CL 104 02/11/2022 1107   CO2 28 02/11/2022 1107   GLUCOSE 84 02/11/2022 1107   BUN 14 02/11/2022 1107   CREATININE 0.46 02/11/2022 1107   CALCIUM 9.4 02/11/2022 1107   PROT 6.9 02/11/2022 1107   ALBUMIN 4.1 02/11/2022 1107   AST 14 (L) 02/11/2022 1107   ALT 9 02/11/2022 1107   ALKPHOS 76 02/11/2022 1107   BILITOT 0.3 02/11/2022 1107   GFRNONAA >60 02/11/2022 1107       ASSESSMENT and THERAPY PLAN:   Malignant neoplasm of lower-outer quadrant of left breast of female, estrogen receptor negative (H[facility]) Julie Townsend is here today for follow-up and evaluation of her breast infection and to discuss IV antibiotics.  I reviewed this case with Dr. Tommy Medal in infectious disease who let me know that he did not think IV antibiotics were necessary since she had a soft tissue infection that was sensitive to antibiotics.  He recommended that she get in with one of the infectious disease physicians to discuss further however thought that further washout of the surgical field may be necessary.  I changed her oral antibiotics from Cipro to Augmentin and she met with Dr. Lindi Adie to review everything and the potential impact treatment delays may have on her outcomes.  Dr. Lindi Adie reassured Julie Townsend that she is 65% finished with her chemotherapy and has received the backbone of her therapy.  After hearing about the purulent drainage that was obtained from her surgical field he did want her to receive a one-time IV Vanco dose today while she is here today which was ordered.     She will follow-up with infectious disease for further evaluation and management of her breast infection, tomorrow and she will see plastic surgery later this week.  We will see her back on 03/18/2021  for labs, f/u, and possibly treatment.     All questions were answered. The patient knows to call the clinic with any problems, questions or concerns. We can certainly see the patient much sooner if necessary.  Wilber Bihari, NP 03/09/22 4:37 PM Medical Oncology and Hematology [facility] [place], [place] [address] Tel. [phone]    Fax. [phone]  *Total Encounter Time as defined by the Centers for Medicare and Medicaid Services includes, in addition to the face-to-face time of a patient visit (documented in the note above) non-face-to-face time: obtaining and reviewing outside history, ordering and reviewing medications, tests or procedures, care coordination (communications with other health care professionals or caregivers) and documentation in the medical record.  .Attending Note  I personally saw and examined Julie Townsend. The plan of care was discussed with her. I agree with the physical exam findings and assessment and plan as documented above. I performed the majority of the counseling and assessment and plan regarding this encounter -Chest wall infection: We discussed different antibiotic choices and she is currently on ciprofloxacin.  I would like to add Augmentin and give her a dose of vancomycin today and IV.  She has an appointment with infectious disease to come up with a better treatment plan.  We will postpone her chemotherapy and I reassured her that it should not impact her overall cure or prognosis.  Signed Harriette Ohara, MD

## 2022-03-10 ENCOUNTER — Encounter: Payer: Self-pay | Admitting: Infectious Diseases

## 2022-03-10 ENCOUNTER — Ambulatory Visit: Payer: Commercial Managed Care - HMO | Admitting: Infectious Diseases

## 2022-03-10 ENCOUNTER — Other Ambulatory Visit: Payer: Self-pay

## 2022-03-10 VITALS — BP 135/90 | HR 81 | Temp 98.2°F | Wt 116.0 lb

## 2022-03-10 DIAGNOSIS — N61 Mastitis without abscess: Secondary | ICD-10-CM

## 2022-03-10 DIAGNOSIS — Z95828 Presence of other vascular implants and grafts: Secondary | ICD-10-CM

## 2022-03-10 DIAGNOSIS — Z79899 Other long term (current) drug therapy: Secondary | ICD-10-CM | POA: Diagnosis not present

## 2022-03-10 HISTORY — DX: Presence of other vascular implants and grafts: Z95.828

## 2022-03-10 NOTE — Progress Notes (Addendum)
Patient Active Problem List   Diagnosis Date Noted   Breast cancer ([place]) 10/29/2021   BRCA2 gene mutation positive 09/26/2021   Genetic testing 09/26/2021   Family history of breast cancer 09/22/2021   Family history of pancreatic cancer 09/22/2021   Family history of ovarian cancer 09/22/2021   Malignant neoplasm of lower-outer quadrant of left breast of female, estrogen receptor negative ([place]) 09/11/2021    Patient's Medications  New Prescriptions   No medications on file  Previous Medications   AMOXICILLIN-CLAVULANATE (AUGMENTIN) 875-125 MG TABLET    Take 1 tablet by mouth 2 (two) times daily.   CYCLOBENZAPRINE (FLEXERIL) 5 MG TABLET    Take 5 mg by mouth 3 (three) times daily as needed for muscle spasms.   DEXAMETHASONE (DECADRON) 4 MG TABLET    Take 1 tablet (4 mg total) by mouth daily. Take one tablet by mouth in the morning for two days after chemotherapy.   DIAZEPAM (VALIUM) 2 MG TABLET    Take 1 tablet (2 mg total) by mouth every 12 (twelve) hours as needed for up to 20 doses for muscle spasms.   GALCANEZUMAB-GNLM (EMGALITY) 120 MG/ML SOAJ    INJECT 120 MG INTO THE SKIN EVERY 28 (TWENTY-EIGHT) DAYS.   LIDOCAINE-PRILOCAINE (EMLA) CREAM    Apply to affected area once   NORTRIPTYLINE (PAMELOR) 10 MG CAPSULE    Take 1 capsule (10 mg total) by mouth at bedtime.   ONDANSETRON (ZOFRAN) 4 MG TABLET    Take 1 tablet (4 mg total) by mouth every 8 (eight) hours as needed for up to 20 doses for nausea or vomiting.   ONDANSETRON (ZOFRAN) 8 MG TABLET    Take 1 tab (8 mg) by mouth every 8 hrs as needed for nausea/vomiting. Start third day after doxorubicin/cyclophosphamide chemotherapy.   OXYCODONE (OXY IR/ROXICODONE) 5 MG IMMEDIATE RELEASE TABLET    Take 1 tablet (5 mg total) by mouth every 8 (eight) hours as needed for up to 5 days for severe pain.   PEGFILGRASTIM-CBQV (UDENYCA) 6 MG/0.6ML INJECTION    INJECT THE CONTENTS OF 1 SYRINGE (6 MG) UNDER THE SKIN ONCE EVERY 14 DAYS AFTER  CHEMOTHERAPY   PROCHLORPERAZINE (COMPAZINE) 10 MG TABLET    Take 1 tablet (10 mg total) by mouth every 6 (six) hours as needed for nausea or vomiting.   SUMATRIPTAN (IMITREX) 50 MG TABLET    TAKE 1 TAB BY MOUTH AS NEEDED FOR MIGRAINE. MAX 2 TABS IN 24 HOURS. MAY REPEAT IN 2 HOURS IF HEADACHE PERSISTS OR RECURS.   UBRELVY 100 MG TABS    TAKE AS NEEDED (TAKE 1 AT THE EARLIST ONSET OF A MIGRAINE. MAY REPEAT IN 2 HOURS. MAX 2/24 HOURS).   ZAVEGEPANT HCL (ZAVZPRET) 10 MG/ACT SOLN    Place 10 mg into the nose daily as needed.  Modified Medications   No medications on file  Discontinued Medications   No medications on file    Subjective: 57 Y O female with PMH as below including Left breast ca ER negative s/p  10/29/2021 Bilateral mastectomies (BRCA2 gene mutation positive), Left axillary LN sampling and Rt IJ port-a-cath placement s/p Adjuvant chemotherapy with Adriamycin and Cytoxan followed by Taxol started 12/03/2021 followed by Oncology Dr Lindi Adie, s/p 9/23 Bilateral immediate breast reconstruction with placement of Acellular Dermal Matrix and tissue expanders with course complicated by infection in rt medial breast s/p PO abtx eventually requiring 12/17/21 Removal of right breast expander for rt breast seroma and trauma for  treatment of a right breast seroma and trauma after accidentally hitting a car door (OR cx MSSA) who is here for rt medial breast cellulitis.   Patient accompanied by husband and her daughter. States that she had taken a couple of courses of PO abtx when she had infection in the rt medial breast prior to removal of rt breast expander in 12/17/21. OR cx MSSA. 11/22 given 5 days course of clindamycin. Reports she feels somewhat better while on abtx but symptoms do not completely resolve. 02/08/22 given  clindamycin 450 mg 3 times daily x 7 days for new tenderness in the rt breast. 1/16 5-10 cc of purulent drainage from a medial breast abscess by Plastics.Marland Kitchen Cx MSSA ( was taking clindamycin).  By 1/23 OV, she  had completed 2 weeks of clindamycin then monitored off abtx until starting cipro 2/3 for pain/swelling and redness in the rt breast at the same spot since 2/3.   Seen at the office 2/6 by plastics as well as Oncology, discussed with Dr Tommy Medal and Ciprofloxacin changed to Augmentin, received one dose of IV vancomycin in the Oncology office. She also has rt medial swelling aspirated at Plastics which was purulent at plastics office but does not seem to have been sent for cultures   Denies fevers, chills, sweats. Denies nausea, vomiting and diarrhea. Denies cough, chest pain and SOB. Denies GU symptoms. Denies rashes and joint pain.   Review of Systems: all systems reviewed with pertinent positives and negatives as listed above   Past Medical History:  Diagnosis Date   BRCA2 gene mutation positive 09/26/2021   Breast cancer ([place]) 09/2021   left breast IDC   Family history of breast cancer 09/22/2021   Family history of ovarian cancer 09/22/2021   Family history of pancreatic cancer 09/22/2021   Genetic testing 09/26/2021   Pathogenic variant in BRCA2 at p.X1062* (c.3922G>T).  Report date is September 26, 2021.    The BRCAplus panel offered by [employer] and includes sequencing and deletion/duplication analysis for the following 8 genes: ATM, BRCA1, BRCA2, CDH1, CHEK2, PALB2, PTEN, and TP53.  Results of pan-cancer panel are pending.     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    correct Hands   Past Surgical History:  Procedure Laterality Date   BREAST IMPLANT REMOVAL Right 12/17/2021   Procedure: Right breast expander removal with washout;  Surgeon: Wallace Going, DO;  Location: [facility];  Service: Plastics;  Laterality: Right;   BREAST RECONSTRUCTION WITH PLACEMENT OF TISSUE EXPANDER AND FLEX HD (ACELLULAR HYDRATED DERMIS) Bilateral 10/29/2021   Procedure: BREAST RECONSTRUCTION WITH PLACEMENT OF TISSUE EXPANDER AND FLEX HD  (ACELLULAR HYDRATED DERMIS);  Surgeon: Wallace Going, DO;  Location: [facility];  Service: Plastics;  Laterality: Bilateral;   CARPAL TUNNEL RELEASE Right 06/20/2020   Procedure: CARPAL TUNNEL RELEASE;  Surgeon: [name], MD;  Location: [facility];  Service: Orthopedics;  Laterality: Right;   MASTECTOMY W/ SENTINEL NODE BIOPSY Left 10/29/2021   Procedure: LEFT MASTECTOMY WITH LEFT SENTINEL LYMPH NODE BIOPSY;  Surgeon: [name], MD;  Location: [facility];  Service: General;  Laterality: Left;   PORTACATH PLACEMENT Right 10/29/2021   Procedure: INSERTION PORT-A-CATH;  Surgeon: [name], MD;  Location: [facility];  Service: General;  Laterality: Right;   TOTAL MASTECTOMY Right 10/29/2021   Procedure: RIGHT TOTAL MASTECTOMY;  Surgeon: [name], MD;  Location: [place];  Service: General;  Laterality: Right;   TRIGGER FINGER RELEASE Left 05/08/2020   Procedure: LEFT LONG FINGER TRIGGER RELEASE AND LEFT RING FINGER TRIGGER RELEASE;  Surgeon: [name], MD;  Location: [place];  Service: Orthopedics;  Laterality: Left;   trigger finger release rt hand       Social History   Tobacco Use   Smoking status: Never   Smokeless tobacco: Never  Vaping Use   Vaping Use: Never used  Substance Use Topics   Alcohol use: Never   Drug use: Never    Family History  Problem Relation Age of Onset   Migraines Mother    Breast cancer Mother 87       recurr at  97   Migraines Maternal Aunt    Colon cancer Maternal Uncle        dx late 60s   Pancreatic cancer Maternal Uncle 28   Migraines Maternal Grandmother    Ovarian cancer Maternal Grandmother        dx 23s    No Known Allergies  Health Maintenance  Topic Date Due   COVID-19 Vaccine (1) Never done   HIV Screening  Never done   Hepatitis C Screening  Never done   DTaP/Tdap/Td (1 - Tdap) Never done   Zoster Vaccines-  Shingrix (1 of 2) Never done   COLONOSCOPY (Pts 45-41yr Insurance coverage will need to be confirmed)  Never done   INFLUENZA VACCINE  11/02/2022 (Originally 09/01/2021)   MAMMOGRAM  08/21/2023   PAP SMEAR-Modifier  07/30/2024   HPV VACCINES  Aged Out    Objective: BP (!) 135/90   Pulse 81   Temp 98.2 F (36.8 C) (Oral)   Wt 116 lb (52.6 kg)   SpO2 100%   BMI 21.22 kg/m    Physical Exam Constitutional:      Appearance: Normal appearance.  HENT:     Head: Normocephalic and atraumatic.      Mouth: Mucous membranes are moist.  Eyes:    Conjunctiva/sclera: Conjunctivae normal.     Pupils: Pupils are equal, round, and bilaterally symmetrical  Cardiovascular:     Rate and Rhythm: Normal rate and regular rhythm.     Heart sounds: s1s2,   Pulmonary:     Effort: Pulmonary effort is normal.     Breath sounds: Normal breath sounds.   Abdominal:     General: Non distended     Palpations: soft.   Musculoskeletal:        General: Normal range of motion.   Skin:    General: Skin is warm and dry.     Comments: RT Port-a-cath - no swelling, warmth and tenderness Examined with chaperone Bilateral mastectomies RT medial breast with dark red swelling, tenderness, fluctuant, no crepitus, has been marked. No active drainage  Neurological:     General: grossly non focal     Mental Status: awake, alert and oriented to person, place, and time.   Psychiatric:        Mood and Affect: Mood normal.   Lab Results Lab Results  Component Value Date   WBC 10.2 02/11/2022   HGB 9.3 (L) 02/11/2022   HCT 27.6 (L) 02/11/2022   MCV 98.2 02/11/2022   PLT 301 02/11/2022    Lab Results  Component Value Date   CREATININE 0.46 02/11/2022   BUN 14 02/11/2022   NA 139 02/11/2022   K 3.7 02/11/2022   CL 104 02/11/2022   CO2 28 02/11/2022  Lab Results  Component Value Date   ALT 9 02/11/2022   AST 14 (L) 02/11/2022   ALKPHOS 76 02/11/2022   BILITOT 0.3 02/11/2022    No results  found for: "CHOL", "HDL", "LDLCALC", "LDLDIRECT", "TRIG", "CHOLHDL" No results found for: "LABRPR", "RPRTITER" No results found for: "HIV1RNAQUANT", "HIV1RNAVL", "CD4TABS"   Microbiology Results for orders placed or performed in visit on 02/16/22  Aerobic/Anaerobic Culture w Gram Stain (surgical/deep wound)     Status: None   Collection Time: 02/16/22  2:32 PM   Specimen: Wound  Result Value Ref Range Status   Specimen Description WOUND LEFT BREAST  Final   Special Requests NONE  Final   Gram Stain   Final    MODERATE WBC PRESENT, PREDOMINANTLY PMN RARE GRAM POSITIVE COCCI IN CLUSTERS    Culture   Final    ABUNDANT STAPHYLOCOCCUS AUREUS NO ANAEROBES ISOLATED Performed at [facility], [place] [address], [place], [place] [address]    Report Status 02/21/2022 FINAL  Final   Organism ID, Bacteria STAPHYLOCOCCUS AUREUS  Final      Susceptibility   Staphylococcus aureus - MIC*    CIPROFLOXACIN <=0.5 SENSITIVE Sensitive     ERYTHROMYCIN <=0.25 SENSITIVE Sensitive     GENTAMICIN <=0.5 SENSITIVE Sensitive     OXACILLIN 0.5 SENSITIVE Sensitive     TETRACYCLINE >=16 RESISTANT Resistant     VANCOMYCIN 1 SENSITIVE Sensitive     TRIMETH/SULFA <=10 SENSITIVE Sensitive     CLINDAMYCIN <=0.25 SENSITIVE Sensitive     RIFAMPIN <=0.5 SENSITIVE Sensitive     Inducible Clindamycin NEGATIVE Sensitive     * ABUNDANT STAPHYLOCOCCUS AUREUS   Imaging No results found.  Assessment/Plan 29 Y O female with PMH as below including Left breast ca ER negative s/p  10/29/2021 Bilateral mastectomies (BRCA2 gene mutation positive), Left axillary LN sampling and Rt IJ port-a-cath placement s/p Adjuvant chemotherapy with Adriamycin and Cytoxan followed by Taxol started 12/03/2021 followed by Oncology Dr Lindi Adie, s/p 9/23 Bilateral immediate breast reconstruction with placement of Acellular Dermal Matrix and tissue expanders with course complicated by infection in rt medial breast s/p PO abtx eventually requiring  12/17/21 Removal of right breast expander for rt breast seroma and trauma for treatment of a right breast seroma and trauma after accidentally hitting a car door (OR cx MSSA) who is here for rt medial breast cellulitis.   # Rt breast cellulitis/Possible Abscess  Prior abtx course and surgical intervention as above in HPI This current episode: Ciprofloxacin 2/3 until 2/6 S/p IV Vancomycin 2/6 and Ciprofloxacin switched to PO augmentin 2/6 She has been closely followed by plastics Dr Marla Roe and has a fu tomorrow to determine for possible surgical intervention. Hence, I will no get additional imaging at this point   Plan  Continue PO augmentin as is ( likely same MSSA which she has been isolating since 12/2021). Do not think IV abtx is needed currently as possibly source control issue and not drug/bug mismatch She possibly needs surgical intervention for source control. Fu with Dr Marla Roe tomorrow Labs today CBC, ESR and CRP as baseline and monitoring Fu in a week to monitor progression with one of my partners in case she does not go for surgery  Discussed with her to mark the cellulitic area to assess progression as well as to go ED in case of signs of sepsis like fevers, chills, malaise, etc  I have personally spent 70 minutes involved in face-to-face and non-face-to-face activities for this patient on the  day of the visit. Professional time spent includes the following activities: Preparing to see the patient (review of tests), Obtaining and/or reviewing separately obtained history (admission/discharge record), Performing a medically appropriate examination and/or evaluation , Ordering medications/tests/procedures, referring and communicating with other health care professionals, Documenting clinical information in the EMR, Independently interpreting results (not separately reported), Communicating results to the patient/family/caregiver, Counseling and educating the patient/family/caregiver  and Care coordination (not separately reported).   Wilber Oliphant, [facility] 03/10/2022, 6:54 AM

## 2022-03-11 ENCOUNTER — Inpatient Hospital Stay: Payer: Commercial Managed Care - HMO

## 2022-03-11 ENCOUNTER — Inpatient Hospital Stay: Payer: Commercial Managed Care - HMO | Admitting: Adult Health

## 2022-03-11 LAB — CBC
HCT: 34.6 % — ABNORMAL LOW (ref 35.0–45.0)
Hemoglobin: 11.8 g/dL (ref 11.7–15.5)
MCH: 32.6 pg (ref 27.0–33.0)
MCHC: 34.1 g/dL (ref 32.0–36.0)
MCV: 95.6 fL (ref 80.0–100.0)
MPV: 9.8 fL (ref 7.5–12.5)
Platelets: 231 10*3/uL (ref 140–400)
RBC: 3.62 10*6/uL — ABNORMAL LOW (ref 3.80–5.10)
RDW: 15.6 % — ABNORMAL HIGH (ref 11.0–15.0)
WBC: 3.7 10*3/uL — ABNORMAL LOW (ref 3.8–10.8)

## 2022-03-11 LAB — SEDIMENTATION RATE: Sed Rate: 36 mm/h — ABNORMAL HIGH (ref 0–30)

## 2022-03-11 LAB — C-REACTIVE PROTEIN: CRP: 7.1 mg/L (ref <8.0)

## 2022-03-12 ENCOUNTER — Encounter: Payer: Self-pay | Admitting: Surgical

## 2022-03-12 ENCOUNTER — Encounter (HOSPITAL_COMMUNITY): Payer: Self-pay | Admitting: Plastic Surgery

## 2022-03-12 ENCOUNTER — Other Ambulatory Visit: Payer: Self-pay

## 2022-03-12 ENCOUNTER — Ambulatory Visit (INDEPENDENT_AMBULATORY_CARE_PROVIDER_SITE_OTHER): Payer: Commercial Managed Care - HMO | Admitting: Surgical

## 2022-03-12 VITALS — BP 120/81 | HR 84

## 2022-03-12 DIAGNOSIS — Z1509 Genetic susceptibility to other malignant neoplasm: Secondary | ICD-10-CM

## 2022-03-12 DIAGNOSIS — C50512 Malignant neoplasm of lower-outer quadrant of left female breast: Secondary | ICD-10-CM

## 2022-03-12 DIAGNOSIS — Z171 Estrogen receptor negative status [ER-]: Secondary | ICD-10-CM

## 2022-03-12 DIAGNOSIS — Z1501 Genetic susceptibility to malignant neoplasm of breast: Secondary | ICD-10-CM

## 2022-03-12 DIAGNOSIS — Z9889 Other specified postprocedural states: Secondary | ICD-10-CM

## 2022-03-12 NOTE — H&P (View-Only) (Signed)
   Referring Provider No referring provider defined for this encounter.   CC: No chief complaint on file.     Julie Townsend is an 57 y.o. female.  HPI: Patient is a very pleasant 57 year old female here for follow-up on her breast reconstruction.  Since her last appointment here on 03/09/2022, she has seen oncology, underwent IV vancomycin infusion x 1, referred to infectious disease by oncology for assistance with selection of antibiotics.  Antibiotics were switched from ciprofloxacin to Augmentin.  Appreciate infectious disease and oncology for their assistance and help.  Patient reports today that she has not noticed any improvement after starting the Augmentin, also has not noticed any worsening of the surrounding erythema.  She does report that the area that was previously drained had filled up very quickly.  She is here with her husband.    She denies any systemic symptoms at this time.  She does report ongoing pain in her right medial breast, she has been using oxycodone as needed, reports mostly at night.  We are planning for returning to the operating room for drainage of the medial right breast with Dr. Marla Roe next week.  We discussed the possibility of Monday or Wednesday depending on availability in the OR.  We discussed the risks associated with returning to the operating room, she did not have any specific questions.  Review of Systems General: No fevers or chills  Physical Exam    03/10/2022   11:15 AM 03/09/2022    1:50 PM 03/09/2022    9:35 AM  Vitals with BMI  Weight 116 lbs 118 lbs 2 oz   Systolic 127 517 001  Diastolic 90 82 90  Pulse 81 84 84    General:  No acute distress,  Alert and oriented, Non-Toxic, Normal speech and affect Right breast: Right medial breast with infected fluid collection noted, fluctuance noted, surrounding erythema and cellulitic changes.  She has significant tenderness with palpation in this area.  Erythema has not extended outside  of the marking.  Assessment/Plan 57 year old female status post bilateral breast reconstruction, right breast with infected fluid collection, we will plan for return to the operating room on Monday, 03/15/2022.  Discussed opening the medial breast incision, irrigating the medial breast pocket.  Dr. Marla Roe was also present during today's encounter and discussed the plan with the patient, all of her questions and her husband's questions were answered to their content.  They are going to continue with Augmentin and continue following up with infectious disease.  An additional 3 cc of purulent drainage was aspirated from the medial right breast, patient overall tolerated this well, had some discomfort.  We will plan to see patient on Monday for surgical intervention.  Pictures were obtained of the patient and placed in the chart with the patient's or guardian's permission.  Will send prescriptions to patient's pharmacy day of surgery, she is currently using oxycodone as needed for pain related to the medial breast.  We will hold off on any additional antibiotics given that she is seeing infectious disease.   Julie Townsend 03/12/2022, 8:18 AM

## 2022-03-12 NOTE — Progress Notes (Signed)
Spoke with pt for pre-op call. Pt denies cardiac history, HTN or Diabetes.  Shower instructions given to pt, she has CHG soap at home and will use that.

## 2022-03-12 NOTE — Progress Notes (Cosign Needed Addendum)
   Referring Provider No referring provider defined for this encounter.   CC: No chief complaint on file.     Julie Townsend is an 57 y.o. female.  HPI: Patient is a very pleasant 57 year old female here for follow-up on her breast reconstruction.  Since her last appointment here on 03/09/2022, she has seen oncology, underwent IV vancomycin infusion x 1, referred to infectious disease by oncology for assistance with selection of antibiotics.  Antibiotics were switched from ciprofloxacin to Augmentin.  Appreciate infectious disease and oncology for their assistance and help.  Patient reports today that she has not noticed any improvement after starting the Augmentin, also has not noticed any worsening of the surrounding erythema.  She does report that the area that was previously drained had filled up very quickly.  She is here with her husband.    She denies any systemic symptoms at this time.  She does report ongoing pain in her right medial breast, she has been using oxycodone as needed, reports mostly at night.  We are planning for returning to the operating room for drainage of the medial right breast with Dr. Marla Roe next week.  We discussed the possibility of Monday or Wednesday depending on availability in the OR.  We discussed the risks associated with returning to the operating room, she did not have any specific questions.  Review of Systems General: No fevers or chills  Physical Exam    03/10/2022   11:15 AM 03/09/2022    1:50 PM 03/09/2022    9:35 AM  Vitals with BMI  Weight 116 lbs 118 lbs 2 oz   Systolic [phone]  Diastolic 90 82 90  Pulse 81 84 84    General:  No acute distress,  Alert and oriented, Non-Toxic, Normal speech and affect Right breast: Right medial breast with infected fluid collection noted, fluctuance noted, surrounding erythema and cellulitic changes.  She has significant tenderness with palpation in this area.  Erythema has not extended outside  of the marking.  Assessment/Plan 57 year old female status post bilateral breast reconstruction, right breast with infected fluid collection, we will plan for return to the operating room on Monday, 03/15/2022.  Discussed opening the medial breast incision, irrigating the medial breast pocket.  Dr. Marla Roe was also present during today's encounter and discussed the plan with the patient, all of her questions and her husband's questions were answered to their content.  They are going to continue with Augmentin and continue following up with infectious disease.  An additional 3 cc of purulent drainage was aspirated from the medial right breast, patient overall tolerated this well, had some discomfort.  We will plan to see patient on Monday for surgical intervention.  Pictures were obtained of the patient and placed in the chart with the patient's or guardian's permission.  Will send prescriptions to patient's pharmacy day of surgery, she is currently using oxycodone as needed for pain related to the medial breast.  We will hold off on any additional antibiotics given that she is seeing infectious disease.   Carola Rhine Myrikal Messmer 03/12/2022, 8:18 AM

## 2022-03-15 ENCOUNTER — Encounter (HOSPITAL_COMMUNITY)
Admission: RE | Disposition: A | Discharge status: Home / Self Care | Payer: Self-pay | Source: Home / Self Care | Attending: Plastic Surgery

## 2022-03-15 ENCOUNTER — Encounter (HOSPITAL_COMMUNITY): Payer: Self-pay | Admitting: Plastic Surgery

## 2022-03-15 ENCOUNTER — Ambulatory Visit (HOSPITAL_COMMUNITY)
Admission: RE | Admit: 2022-03-15 | Discharge: 2022-03-15 | Disposition: A | Discharge status: Home / Self Care | Payer: Commercial Managed Care - HMO | Attending: Plastic Surgery | Admitting: Plastic Surgery

## 2022-03-15 ENCOUNTER — Ambulatory Visit (HOSPITAL_BASED_OUTPATIENT_CLINIC_OR_DEPARTMENT_OTHER): Payer: Commercial Managed Care - HMO | Admitting: Anesthesiology

## 2022-03-15 ENCOUNTER — Other Ambulatory Visit: Payer: Self-pay

## 2022-03-15 ENCOUNTER — Ambulatory Visit (HOSPITAL_COMMUNITY): Payer: Commercial Managed Care - HMO | Admitting: Anesthesiology

## 2022-03-15 DIAGNOSIS — N6489 Other specified disorders of breast: Secondary | ICD-10-CM | POA: Insufficient documentation

## 2022-03-15 DIAGNOSIS — Z853 Personal history of malignant neoplasm of breast: Secondary | ICD-10-CM

## 2022-03-15 DIAGNOSIS — L7634 Postprocedural seroma of skin and subcutaneous tissue following other procedure: Secondary | ICD-10-CM

## 2022-03-15 DIAGNOSIS — Z9013 Acquired absence of bilateral breasts and nipples: Secondary | ICD-10-CM

## 2022-03-15 HISTORY — PX: IRRIGATION AND DEBRIDEMENT OF WOUND WITH SPLIT THICKNESS SKIN GRAFT: SHX5879

## 2022-03-15 SURGERY — IRRIGATION AND DEBRIDEMENT OF WOUND WITH SPLIT THICKNESS SKIN GRAFT
Anesthesia: General | Site: Breast | Laterality: Right

## 2022-03-15 MED ORDER — LIDOCAINE-EPINEPHRINE 1 %-1:100000 IJ SOLN
INTRAMUSCULAR | Status: AC
  Filled 2022-03-15: qty 1

## 2022-03-15 MED ORDER — CHLORHEXIDINE GLUCONATE 0.12 % MT SOLN
OROMUCOSAL | Status: AC
  Administered 2022-03-15: 15 mL via OROMUCOSAL
  Filled 2022-03-15: qty 15

## 2022-03-15 MED ORDER — LACTATED RINGERS IV SOLN: INTRAVENOUS | Status: DC

## 2022-03-15 MED ORDER — FENTANYL CITRATE (PF) 100 MCG/2ML IJ SOLN
INTRAMUSCULAR | Status: AC
  Filled 2022-03-15: qty 2

## 2022-03-15 MED ORDER — FENTANYL CITRATE (PF) 250 MCG/5ML IJ SOLN
INTRAMUSCULAR | Status: AC
  Filled 2022-03-15: qty 5

## 2022-03-15 MED ORDER — FENTANYL CITRATE (PF) 250 MCG/5ML IJ SOLN
INTRAMUSCULAR | Status: DC | PRN
  Administered 2022-03-15 (×2): 50 ug via INTRAVENOUS
  Administered 2022-03-15: 100 ug via INTRAVENOUS
  Administered 2022-03-15: 50 ug via INTRAVENOUS

## 2022-03-15 MED ORDER — PHENYLEPHRINE HCL-NACL 20-0.9 MG/250ML-% IV SOLN
INTRAVENOUS | Status: DC | PRN
  Administered 2022-03-15: 50 ug/min via INTRAVENOUS

## 2022-03-15 MED ORDER — CHLORHEXIDINE GLUCONATE CLOTH 2 % EX PADS: 6.0000 | MEDICATED_PAD | Freq: Once | CUTANEOUS | Status: DC

## 2022-03-15 MED ORDER — CHLORHEXIDINE GLUCONATE 0.12 % MT SOLN: 15.0000 mL | Freq: Once | OROMUCOSAL | Status: AC

## 2022-03-15 MED ORDER — KETOROLAC TROMETHAMINE 30 MG/ML IJ SOLN
30.0000 mg | Freq: Once | INTRAMUSCULAR | Status: AC | PRN
  Administered 2022-03-15: 30 mg via INTRAVENOUS

## 2022-03-15 MED ORDER — ORAL CARE MOUTH RINSE: 15.0000 mL | Freq: Once | OROMUCOSAL | Status: AC

## 2022-03-15 MED ORDER — KETOROLAC TROMETHAMINE 30 MG/ML IJ SOLN
INTRAMUSCULAR | Status: AC
  Filled 2022-03-15: qty 1

## 2022-03-15 MED ORDER — ACETAMINOPHEN 325 MG PO TABS: 650.0000 mg | ORAL_TABLET | ORAL | Status: DC | PRN

## 2022-03-15 MED ORDER — OXYCODONE HCL 5 MG PO TABS: 5.0000 mg | ORAL_TABLET | ORAL | Status: DC | PRN

## 2022-03-15 MED ORDER — ACETAMINOPHEN 650 MG RE SUPP: 650.0000 mg | RECTAL | Status: DC | PRN

## 2022-03-15 MED ORDER — FENTANYL CITRATE (PF) 100 MCG/2ML IJ SOLN
25.0000 ug | INTRAMUSCULAR | Status: DC | PRN
  Administered 2022-03-15 (×2): 50 ug via INTRAVENOUS

## 2022-03-15 MED ORDER — FENTANYL CITRATE (PF) 100 MCG/2ML IJ SOLN: 25.0000 ug | INTRAMUSCULAR | Status: DC | PRN

## 2022-03-15 MED ORDER — SUCCINYLCHOLINE CHLORIDE 200 MG/10ML IV SOSY
PREFILLED_SYRINGE | INTRAVENOUS | Status: DC | PRN
  Administered 2022-03-15: 100 mg via INTRAVENOUS

## 2022-03-15 MED ORDER — SODIUM CHLORIDE 0.9% FLUSH: 3.0000 mL | INTRAVENOUS | Status: DC | PRN

## 2022-03-15 MED ORDER — MIDAZOLAM HCL 2 MG/2ML IJ SOLN
INTRAMUSCULAR | Status: DC | PRN
  Administered 2022-03-15: 1 mg via INTRAVENOUS

## 2022-03-15 MED ORDER — EPHEDRINE SULFATE-NACL 50-0.9 MG/10ML-% IV SOSY
PREFILLED_SYRINGE | INTRAVENOUS | Status: DC | PRN
  Administered 2022-03-15: 5 mg via INTRAVENOUS
  Administered 2022-03-15: 10 mg via INTRAVENOUS
  Administered 2022-03-15: 5 mg via INTRAVENOUS

## 2022-03-15 MED ORDER — SODIUM CHLORIDE 0.9 % IV SOLN: 250.0000 mL | INTRAVENOUS | Status: DC | PRN

## 2022-03-15 MED ORDER — LIDOCAINE-EPINEPHRINE 1 %-1:100000 IJ SOLN
INTRAMUSCULAR | Status: DC | PRN
  Administered 2022-03-15: 10 mL

## 2022-03-15 MED ORDER — SODIUM CHLORIDE 0.9% FLUSH: 3.0000 mL | Freq: Two times a day (BID) | INTRAVENOUS | Status: DC

## 2022-03-15 MED ORDER — OXYCODONE HCL 5 MG PO TABS: 5.0000 mg | ORAL_TABLET | Freq: Once | ORAL | Status: DC | PRN

## 2022-03-15 MED ORDER — OXYCODONE HCL 5 MG/5ML PO SOLN: 5.0000 mg | Freq: Once | ORAL | Status: DC | PRN

## 2022-03-15 MED ORDER — LIDOCAINE 2% (20 MG/ML) 5 ML SYRINGE
INTRAMUSCULAR | Status: DC | PRN
  Administered 2022-03-15: 100 mg via INTRAVENOUS

## 2022-03-15 MED ORDER — ONDANSETRON HCL 4 MG/2ML IJ SOLN
INTRAMUSCULAR | Status: DC | PRN
  Administered 2022-03-15: 4 mg via INTRAVENOUS

## 2022-03-15 MED ORDER — CEFAZOLIN SODIUM-DEXTROSE 2-4 GM/100ML-% IV SOLN
INTRAVENOUS | Status: AC
  Filled 2022-03-15: qty 100

## 2022-03-15 MED ORDER — 0.9 % SODIUM CHLORIDE (POUR BTL) OPTIME
TOPICAL | Status: DC | PRN
  Administered 2022-03-15: 1000 mL

## 2022-03-15 MED ORDER — MIDAZOLAM HCL 2 MG/2ML IJ SOLN
INTRAMUSCULAR | Status: AC
  Filled 2022-03-15: qty 2

## 2022-03-15 MED ORDER — PROPOFOL 10 MG/ML IV BOLUS
INTRAVENOUS | Status: DC | PRN
  Administered 2022-03-15: 50 mg via INTRAVENOUS
  Administered 2022-03-15: 150 mg via INTRAVENOUS

## 2022-03-15 MED ORDER — CEFAZOLIN SODIUM-DEXTROSE 2-4 GM/100ML-% IV SOLN
2.0000 g | INTRAVENOUS | Status: AC
  Administered 2022-03-15: 2 g via INTRAVENOUS

## 2022-03-15 MED ORDER — DIPHENHYDRAMINE HCL 50 MG/ML IJ SOLN
INTRAMUSCULAR | Status: DC | PRN
  Administered 2022-03-15: 6.25 mg via INTRAVENOUS

## 2022-03-15 MED ORDER — EPHEDRINE 5 MG/ML INJ
INTRAVENOUS | Status: AC
  Filled 2022-03-15: qty 5

## 2022-03-15 MED ORDER — ONDANSETRON HCL 4 MG/2ML IJ SOLN: 4.0000 mg | Freq: Once | INTRAMUSCULAR | Status: DC | PRN

## 2022-03-15 MED ORDER — DEXAMETHASONE SODIUM PHOSPHATE 10 MG/ML IJ SOLN
INTRAMUSCULAR | Status: DC | PRN
  Administered 2022-03-15: 10 mg via INTRAVENOUS

## 2022-03-15 SURGICAL SUPPLY — 62 items
AGENT HMST GEL NTOXPRSV FR 28 (Miscellaneous) ×1 IMPLANT
AGENT HMST PWDR HDRLZ BVN CLGN (Miscellaneous) ×1 IMPLANT
APL SKNCLS STERI-STRIP NONHPOA (GAUZE/BANDAGES/DRESSINGS) ×1
APL SWBSTK 6 STRL LF DISP (MISCELLANEOUS)
APPLICATOR COTTON TIP 6 STRL (MISCELLANEOUS) IMPLANT
APPLICATOR COTTON TIP 6IN STRL (MISCELLANEOUS)
BAG COUNTER SPONGE SURGICOUNT (BAG) ×1 IMPLANT
BAG DECANTER FOR FLEXI CONT (MISCELLANEOUS) IMPLANT
BAG SPNG CNTER NS LX DISP (BAG) ×1
BENZOIN TINCTURE PRP APPL 2/3 (GAUZE/BANDAGES/DRESSINGS) ×1 IMPLANT
BIOPATCH RED 1 DISK 7.0 (GAUZE/BANDAGES/DRESSINGS) IMPLANT
CANISTER SUCT 3000ML PPV (MISCELLANEOUS) ×1 IMPLANT
CNTNR URN SCR LID CUP LEK RST (MISCELLANEOUS) IMPLANT
COLLAGEN CELLERATERX 1 GRAM (Miscellaneous) IMPLANT
CONT SPEC 4OZ STRL OR WHT (MISCELLANEOUS)
COVER SURGICAL LIGHT HANDLE (MISCELLANEOUS) ×1 IMPLANT
DRAIN CHANNEL 15F RND FF W/TCR (WOUND CARE) IMPLANT
DRAIN CHANNEL 19F RND (DRAIN) IMPLANT
DRAIN JP 10F RND SILICONE (MISCELLANEOUS) IMPLANT
DRAPE HALF SHEET 40X57 (DRAPES) IMPLANT
DRAPE IMP U-DRAPE 54X76 (DRAPES) ×1 IMPLANT
DRAPE INCISE IOBAN 66X45 STRL (DRAPES) IMPLANT
DRAPE LAPAROSCOPIC ABDOMINAL (DRAPES) IMPLANT
DRAPE LAPAROTOMY 100X72 PEDS (DRAPES) ×1 IMPLANT
DRSG ADAPTIC 3X8 NADH LF (GAUZE/BANDAGES/DRESSINGS) IMPLANT
DRSG TELFA 3X8 NADH STRL (GAUZE/BANDAGES/DRESSINGS) IMPLANT
DRSG VAC ATS LRG SENSATRAC (GAUZE/BANDAGES/DRESSINGS) IMPLANT
DRSG VAC ATS MED SENSATRAC (GAUZE/BANDAGES/DRESSINGS) IMPLANT
DRSG VAC ATS SM SENSATRAC (GAUZE/BANDAGES/DRESSINGS) IMPLANT
ELECT CAUTERY BLADE 6.4 (BLADE) IMPLANT
ELECT REM PT RETURN 9FT ADLT (ELECTROSURGICAL) ×1
ELECTRODE REM PT RTRN 9FT ADLT (ELECTROSURGICAL) ×1 IMPLANT
GAUZE PAD ABD 8X10 STRL (GAUZE/BANDAGES/DRESSINGS) IMPLANT
GAUZE SPONGE 4X4 12PLY STRL (GAUZE/BANDAGES/DRESSINGS) IMPLANT
GEL CELLERATE 28G (Miscellaneous) IMPLANT
GLOVE BIO SURGEON STRL SZ 6.5 (GLOVE) ×1 IMPLANT
GLOVE BIOGEL M 6.5 STRL (GLOVE) ×1 IMPLANT
GOWN STRL REUS W/ TWL LRG LVL3 (GOWN DISPOSABLE) ×3 IMPLANT
GOWN STRL REUS W/TWL LRG LVL3 (GOWN DISPOSABLE) ×3
KIT BASIN OR (CUSTOM PROCEDURE TRAY) ×1 IMPLANT
KIT TURNOVER KIT B (KITS) ×1 IMPLANT
NDL HYPO 25GX1X1/2 BEV (NEEDLE) ×1 IMPLANT
NEEDLE HYPO 25GX1X1/2 BEV (NEEDLE) ×1 IMPLANT
NS IRRIG 1000ML POUR BTL (IV SOLUTION) ×1 IMPLANT
PACK GENERAL/GYN (CUSTOM PROCEDURE TRAY) ×1 IMPLANT
PACK UNIVERSAL I (CUSTOM PROCEDURE TRAY) ×1 IMPLANT
PAD ARMBOARD 7.5X6 YLW CONV (MISCELLANEOUS) ×2 IMPLANT
STAPLER VISISTAT 35W (STAPLE) ×1 IMPLANT
SURGILUBE 2OZ TUBE FLIPTOP (MISCELLANEOUS) IMPLANT
SUT ETHILON 4 0 PS 2 18 (SUTURE) IMPLANT
SUT MNCRL AB 3-0 PS2 27 (SUTURE) IMPLANT
SUT MNCRL AB 4-0 PS2 18 (SUTURE) IMPLANT
SUT MON AB 2-0 CT1 36 (SUTURE) IMPLANT
SUT MON AB 5-0 PS2 18 (SUTURE) IMPLANT
SUT SILK 4 0 PS 2 (SUTURE) IMPLANT
SUT VIC AB 5-0 PS2 18 (SUTURE) IMPLANT
SUT VICRYL 3 0 (SUTURE) IMPLANT
SWAB COLLECTION DEVICE MRSA (MISCELLANEOUS) IMPLANT
SWAB CULTURE ESWAB REG 1ML (MISCELLANEOUS) IMPLANT
SYR CONTROL 10ML LL (SYRINGE) ×1 IMPLANT
TOWEL GREEN STERILE (TOWEL DISPOSABLE) ×1 IMPLANT
UNDERPAD 30X36 HEAVY ABSORB (UNDERPADS AND DIAPERS) ×1 IMPLANT

## 2022-03-15 NOTE — Transfer of Care (Signed)
Immediate Anesthesia Transfer of Care Note  Patient: Julie Townsend  Procedure(s) Performed: Debridement of right breast (Right: Breast)  Patient Location: PACU  Anesthesia Type:General  Level of Consciousness: awake, patient cooperative, and responds to stimulation  Airway & Oxygen Therapy: Patient Spontanous Breathing and Patient connected to nasal cannula oxygen  Post-op Assessment: Report given to RN and Post -op Vital signs reviewed and stable  Post vital signs: Reviewed and stable  Last Vitals:  Vitals Value Taken Time  BP 134/82 03/15/22 1440  Temp    Pulse 93 03/15/22 1441  Resp 21 03/15/22 1441  SpO2 100 % 03/15/22 1441  Vitals shown include unvalidated device data.  Last Pain:  Vitals:   03/15/22 1430  TempSrc:   PainSc: Asleep         Complications: No notable events documented.

## 2022-03-15 NOTE — Anesthesia Procedure Notes (Signed)
Procedure Name: Intubation Date/Time: 03/15/2022 1:46 PM  Performed by: Cathren Harsh, CRNAPre-anesthesia Checklist: Patient identified, Emergency Drugs available, Suction available and Patient being monitored Patient Re-evaluated:Patient Re-evaluated prior to induction Oxygen Delivery Method: Circle System Utilized Preoxygenation: Pre-oxygenation with 100% oxygen Induction Type: IV induction Ventilation: Mask ventilation without difficulty LMA: LMA inserted LMA Size: 3.0 and 4.0 Laryngoscope Size: Mac and 4 Grade View: Grade I Tube type: Oral Tube size: 7.0 mm Number of attempts: 1 Airway Equipment and Method: Stylet and Oral airway Placement Confirmation: ETT inserted through vocal cords under direct vision, positive ETCO2 and breath sounds checked- equal and bilateral Secured at: 21 cm Tube secured with: Tape Dental Injury: Teeth and Oropharynx as per pre-operative assessment  Comments: See quicknote for details.  7.0 OETT for case.

## 2022-03-15 NOTE — Op Note (Signed)
Op note:    DATE OF PROCEDURE: 03/15/2022  LOCATION: [facility]  SURGEON: Lyndee Leo Sanger Yuvia Plant, DO  ASSISTANT: Donnamarie Rossetti, PA  PREOPERATIVE DIAGNOSIS 1. History of breast cancer 2. Acquired absence of bilateral breasts 3. Seroma of right breast  POSTOPERATIVE DIAGNOSIS Same as preoperative diagnosis  PROCEDURES 1. Evacuation of right breast seroma with cellerate and drain placement   COMPLICATIONS: None.  DRAINS: #15  INDICATIONS FOR PROCEDURE Julie Townsend is a 57 y.o. year-old female born on 24-Jul-1965.  She underwent bilateral mastectomies with reconstruction with expander placement.  She had a trauma to the right breast and then a fluid collection.  She underwent removal of the expander previously and has had collection of fluid to the medial right breast.  The decision was made to bring her to the OR to be sure she did not have any retained ADM and evacuation of the seroma.  CONSENT Informed consent was obtained directly from the patient. The risks, benefits and alternatives were fully discussed. Specific risks including but not limited to bleeding, infection, hematoma, seroma, scarring, pain, asymmetry, poor cosmetic results, and need for further surgery were discussed. The patient's questions were answered.  DESCRIPTION OF PROCEDURE  Patient was brought into the operating room and rested on the operating room table in the supine position.  SCDs were placed and appropriate padding was performed.  Antibiotics were given. The patient underwent general anesthesia and the chest was prepped and draped in a sterile fashion.  A timeout was performed and all information was confirmed to be correct by those in the room.  Local was placed in the incision line to be used.  The previous mastectomy incision was utilized.  The skin was opened with the #15 blade for 3 cm.  The Bovie was used to dissect to the pocket of the breast. There was a  small seroma.  Nothing was concerning in appearance.  Hemostasis was achieved with the Bovie.  The pocket was irrigated with saline.   The skin was closed with deep dermal 3-0 Monocryl and subcuticular 4-0 Monocryl sutures. I released the scar tissue under the incision from the previous surgery to help decrease the tension.  The medial breast area skin opened due to how thin it was.  Cellerate was placed.  A drain was placed and secured to the lateral chest wall with the 4-0 Silk, The patient tolerated the procedure well. The patient was allowed to wake from anesthesia and taken to the recovery room in satisfactory condition.  The advanced practice practitioner (APP) assisted throughout the case.  The APP was essential in retraction and counter traction when needed to make the case progress smoothly.  This retraction and assistance made it possible to see the tissue plans for the procedure.  The assistance was needed for blood control, tissue re-approximation and assisted with closure of the incision site.

## 2022-03-15 NOTE — Anesthesia Postprocedure Evaluation (Signed)
Anesthesia Post Note  Patient: Julie Townsend  Procedure(s) Performed: Debridement of right breast (Right: Breast)     Patient location during evaluation: PACU Anesthesia Type: General Level of consciousness: awake and alert Pain management: pain level controlled Vital Signs Assessment: post-procedure vital signs reviewed and stable Respiratory status: spontaneous breathing, nonlabored ventilation, respiratory function stable and patient connected to nasal cannula oxygen Cardiovascular status: blood pressure returned to baseline and stable Postop Assessment: no apparent nausea or vomiting Anesthetic complications: no  No notable events documented.  Last Vitals:  Vitals:   03/15/22 1510 03/15/22 1525  BP: 117/79 119/88  Pulse: 85 86  Resp: 19 17  Temp:  36.5 C  SpO2: 100% 100%    Last Pain:  Vitals:   03/15/22 1525  TempSrc:   PainSc: 3                  [name]

## 2022-03-15 NOTE — Anesthesia Preprocedure Evaluation (Signed)
Anesthesia Evaluation  Patient identified by MRN, date of birth, ID band Patient awake    Reviewed: Allergy & Precautions, H&P , NPO status , Patient's Chart, lab work & pertinent test results  Airway Mallampati: II  TM Distance: >3 FB Neck ROM: Full    Dental no notable dental hx.    Pulmonary neg pulmonary ROS   Pulmonary exam normal breath sounds clear to auscultation       Cardiovascular negative cardio ROS Normal cardiovascular exam Rhythm:Regular Rate:Normal     Neuro/Psych negative neurological ROS  negative psych ROS   GI/Hepatic negative GI ROS, Neg liver ROS,,,  Endo/Other  negative endocrine ROS    Renal/GU negative Renal ROS  negative genitourinary   Musculoskeletal negative musculoskeletal ROS (+)    Abdominal   Peds negative pediatric ROS (+)  Hematology negative hematology ROS (+)   Anesthesia Other Findings   Reproductive/Obstetrics negative OB ROS                             Anesthesia Physical Anesthesia Plan  ASA: 2  Anesthesia Plan: General   Post-op Pain Management: Minimal or no pain anticipated   Induction: Intravenous  PONV Risk Score and Plan: 3 and Ondansetron, Dexamethasone and Treatment may vary due to age or medical condition  Airway Management Planned: LMA  Additional Equipment:   Intra-op Plan:   Post-operative Plan: Extubation in OR  Informed Consent: I have reviewed the patients History and Physical, chart, labs and discussed the procedure including the risks, benefits and alternatives for the proposed anesthesia with the patient or authorized representative who has indicated his/her understanding and acceptance.     Dental advisory given  Plan Discussed with: CRNA and Surgeon  Anesthesia Plan Comments:        Anesthesia Quick Evaluation

## 2022-03-15 NOTE — Interval H&P Note (Signed)
History and Physical Interval Note:  03/15/2022 1:04 PM  Julie Townsend  has presented today for surgery, with the diagnosis of R breast wound.  The various methods of treatment have been discussed with the patient and family. After consideration of risks, benefits and other options for treatment, the patient has consented to  Procedure(s): Debridement of right breast (Right) as a surgical intervention.  The patient's history has been reviewed, patient examined, no change in status, stable for surgery.  I have reviewed the patient's chart and labs.  Questions were answered to the patient's satisfaction.     Loel Lofty [name]

## 2022-03-15 NOTE — Discharge Instructions (Signed)

## 2022-03-15 NOTE — Interval H&P Note (Signed)
History and Physical Interval Note:  03/15/2022 1:04 PM  Julie Townsend  has presented today for surgery, with the diagnosis of R breast wound.  The various methods of treatment have been discussed with the patient and family. After consideration of risks, benefits and other options for treatment, the patient has consented to  Procedure(s): Debridement of right breast (Right) as a surgical intervention.  The patient's history has been reviewed, patient examined, no change in status, stable for surgery.  I have reviewed the patient's chart and labs.  Questions were answered to the patient's satisfaction.     Loel Lofty Judith Demps

## 2022-03-16 ENCOUNTER — Encounter (HOSPITAL_COMMUNITY): Payer: Self-pay | Admitting: Plastic Surgery

## 2022-03-16 ENCOUNTER — Ambulatory Visit (INDEPENDENT_AMBULATORY_CARE_PROVIDER_SITE_OTHER): Payer: Commercial Managed Care - HMO | Admitting: Physician Assistant

## 2022-03-16 ENCOUNTER — Telehealth: Payer: Self-pay | Admitting: Hematology and Oncology

## 2022-03-16 DIAGNOSIS — Z9889 Other specified postprocedural states: Secondary | ICD-10-CM

## 2022-03-16 NOTE — Progress Notes (Signed)
This is a 86 who is postop day #1 status post evacuation of right breast fluid collection with and drain placement by Dr. Marla Roe yesterday on 03/15/2022.  The patient was reached via phone for postop telehealth visit.  She notes that she is doing well, no significant complaints other than ongoing drainage from the open wound.  She did have questions with regards to the wound care moving forward.  Telehealth visit.  Patient speaking full sentences with no acute distress  Overall the patient appears to be doing well.  She has ongoing drainage from the medial breast wound that was unable to be completely closed.  She will continue frequent dressing changes.  Would like to see her back in our office for her formal follow-up as previously scheduled.  She will reach out to [place] immediately she develops any new or worsening signs or symptoms.  She verbalized understanding and agreement to today's plan had no further questions or concerns.

## 2022-03-16 NOTE — Telephone Encounter (Signed)
Rescheduled appointment per provider PAL. Left voicemail. 

## 2022-03-17 ENCOUNTER — Ambulatory Visit: Payer: Commercial Managed Care - HMO | Admitting: Internal Medicine

## 2022-03-17 MED FILL — Dexamethasone Sodium Phosphate Inj 100 MG/10ML: INTRAMUSCULAR | Qty: 1 | Status: AC

## 2022-03-18 ENCOUNTER — Inpatient Hospital Stay: Payer: Commercial Managed Care - HMO

## 2022-03-18 ENCOUNTER — Other Ambulatory Visit: Payer: Self-pay

## 2022-03-18 ENCOUNTER — Inpatient Hospital Stay (HOSPITAL_BASED_OUTPATIENT_CLINIC_OR_DEPARTMENT_OTHER): Payer: Commercial Managed Care - HMO | Admitting: Hematology and Oncology

## 2022-03-18 VITALS — BP 142/92 | HR 72 | Temp 97.7°F | Resp 18 | Wt 117.2 lb

## 2022-03-18 DIAGNOSIS — Z95828 Presence of other vascular implants and grafts: Secondary | ICD-10-CM

## 2022-03-18 DIAGNOSIS — Z171 Estrogen receptor negative status [ER-]: Secondary | ICD-10-CM | POA: Diagnosis not present

## 2022-03-18 DIAGNOSIS — Z5111 Encounter for antineoplastic chemotherapy: Secondary | ICD-10-CM | POA: Diagnosis not present

## 2022-03-18 DIAGNOSIS — C50512 Malignant neoplasm of lower-outer quadrant of left female breast: Secondary | ICD-10-CM

## 2022-03-18 LAB — CBC WITH DIFFERENTIAL (CANCER CENTER ONLY)
Abs Immature Granulocytes: 0.01 10*3/uL (ref 0.00–0.07)
Basophils Absolute: 0 10*3/uL (ref 0.0–0.1)
Basophils Relative: 1 %
Eosinophils Absolute: 0.2 10*3/uL (ref 0.0–0.5)
Eosinophils Relative: 4 %
HCT: 33.1 % — ABNORMAL LOW (ref 36.0–46.0)
Hemoglobin: 11.1 g/dL — ABNORMAL LOW (ref 12.0–15.0)
Immature Granulocytes: 0 %
Lymphocytes Relative: 41 %
Lymphs Abs: 1.6 10*3/uL (ref 0.7–4.0)
MCH: 32.8 pg (ref 26.0–34.0)
MCHC: 33.5 g/dL (ref 30.0–36.0)
MCV: 97.9 fL (ref 80.0–100.0)
Monocytes Absolute: 0.3 10*3/uL (ref 0.1–1.0)
Monocytes Relative: 8 %
Neutro Abs: 1.7 10*3/uL (ref 1.7–7.7)
Neutrophils Relative %: 46 %
Platelet Count: 238 10*3/uL (ref 150–400)
RBC: 3.38 MIL/uL — ABNORMAL LOW (ref 3.87–5.11)
RDW: 14.2 % (ref 11.5–15.5)
WBC Count: 3.8 10*3/uL — ABNORMAL LOW (ref 4.0–10.5)
nRBC: 0 % (ref 0.0–0.2)

## 2022-03-18 LAB — CMP (CANCER CENTER ONLY)
ALT: 13 U/L (ref 0–44)
AST: 19 U/L (ref 15–41)
Albumin: 4 g/dL (ref 3.5–5.0)
Alkaline Phosphatase: 62 U/L (ref 38–126)
Anion gap: 6 (ref 5–15)
BUN: 11 mg/dL (ref 6–20)
CO2: 29 mmol/L (ref 22–32)
Calcium: 9.4 mg/dL (ref 8.9–10.3)
Chloride: 105 mmol/L (ref 98–111)
Creatinine: 0.47 mg/dL (ref 0.44–1.00)
GFR, Estimated: >60 mL/min (ref >60)
Glucose, Bld: 85 mg/dL (ref 70–99)
Potassium: 3.8 mmol/L (ref 3.5–5.1)
Sodium: 140 mmol/L (ref 135–145)
Total Bilirubin: 0.4 mg/dL (ref 0.3–1.2)
Total Protein: 6.6 g/dL (ref 6.5–8.1)

## 2022-03-18 MED ORDER — SODIUM CHLORIDE 0.9% FLUSH
10.0000 mL | INTRAVENOUS | Status: AC | PRN
  Administered 2022-03-18: 10 mL

## 2022-03-18 MED ORDER — SODIUM CHLORIDE 0.9% FLUSH
10.0000 mL | INTRAVENOUS | Status: DC | PRN
  Administered 2022-03-18: 10 mL

## 2022-03-18 MED ORDER — SODIUM CHLORIDE 0.9 % IV SOLN
10.0000 mg | Freq: Once | INTRAVENOUS | Status: AC
  Administered 2022-03-18: 10 mg via INTRAVENOUS
  Filled 2022-03-18: qty 10

## 2022-03-18 MED ORDER — SODIUM CHLORIDE 0.9 % IV SOLN: Freq: Once | INTRAVENOUS | Status: AC

## 2022-03-18 MED ORDER — DIPHENHYDRAMINE HCL 50 MG/ML IJ SOLN
25.0000 mg | Freq: Once | INTRAMUSCULAR | Status: AC
  Administered 2022-03-18: 25 mg via INTRAVENOUS
  Filled 2022-03-18: qty 1

## 2022-03-18 MED ORDER — SODIUM CHLORIDE 0.9 % IV SOLN
80.0000 mg/m2 | Freq: Once | INTRAVENOUS | Status: AC
  Administered 2022-03-18: 120 mg via INTRAVENOUS
  Filled 2022-03-18: qty 20

## 2022-03-18 MED ORDER — PALONOSETRON HCL INJECTION 0.25 MG/5ML
0.2500 mg | Freq: Once | INTRAVENOUS | Status: AC
  Administered 2022-03-18: 0.25 mg via INTRAVENOUS
  Filled 2022-03-18: qty 5

## 2022-03-18 MED ORDER — FAMOTIDINE IN NACL 20-0.9 MG/50ML-% IV SOLN
20.0000 mg | Freq: Once | INTRAVENOUS | Status: AC
  Administered 2022-03-18: 20 mg via INTRAVENOUS
  Filled 2022-03-18: qty 50

## 2022-03-18 MED ORDER — HEPARIN SOD (PORK) LOCK FLUSH 100 UNIT/ML IV SOLN
500.0000 [IU] | Freq: Once | INTRAVENOUS | Status: AC | PRN
  Administered 2022-03-18: 500 [IU]

## 2022-03-18 NOTE — Assessment & Plan Note (Signed)
09/07/2021:Screening mammogram detected left breast asymmetry posteriorly.  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, margins negative, 0/5 sentinel lymph nodes negative, ER 0%, PR 0%, HER2 negative, Ki-67 70%   Treatment plan: Adjuvant chemotherapy with Adriamycin and Cytoxan followed by Taxol started 12/03/2021 No role of adjuvant radiation or antiestrogen therapy --------------------------------------------------------------------------------------------------------------------------------------------------------------------- Current treatment: Completed 4 cycles of dose dense Adriamycin and Cytoxan, today cycle 2 Taxol Echocardiogram 09/22/2021: EF 60 to 65%   Chemotoxicities: Nausea: Severe for 2 to 3 days after chemo  fatigue Chemotherapy-induced anemia: Today's hemoglobin is 9.3     Breast infection: treated with antibiotics She will need a oophorectomy once chemotherapy is completed.   Return to clinic in weekly for Taxol treatments.

## 2022-03-18 NOTE — Patient Instructions (Signed)
[facility]  Discharge Instructions: Thank you for choosing [facility] to provide your oncology and hematology care.   If you have a lab appointment with the [facility], please go directly to the [facility] and check in at the registration area.   Wear comfortable clothing and clothing appropriate for easy access to any Portacath or PICC line.   We strive to give you quality time with your provider. You may need to reschedule your appointment if you arrive late (15 or more minutes).  Arriving late affects you and other patients whose appointments are after yours.  Also, if you miss three or more appointments without notifying the office, you may be dismissed from the clinic at the provider's discretion.      For prescription refill requests, have your pharmacy contact our office and allow 72 hours for refills to be completed.    Today you received the following chemotherapy and/or immunotherapy agents; Paclitaxel       To help prevent nausea and vomiting after your treatment, we encourage you to take your nausea medication as directed.  BELOW ARE SYMPTOMS THAT SHOULD BE REPORTED IMMEDIATELY: *FEVER GREATER THAN 100.4 F (38 C) OR HIGHER *CHILLS OR SWEATING *NAUSEA AND VOMITING THAT IS NOT CONTROLLED WITH YOUR NAUSEA MEDICATION *UNUSUAL SHORTNESS OF BREATH *UNUSUAL BRUISING OR BLEEDING *URINARY PROBLEMS (pain or burning when urinating, or frequent urination) *BOWEL PROBLEMS (unusual diarrhea, constipation, pain near the anus) TENDERNESS IN MOUTH AND THROAT WITH OR WITHOUT PRESENCE OF ULCERS (sore throat, sores in mouth, or a toothache) UNUSUAL RASH, SWELLING OR PAIN  UNUSUAL VAGINAL DISCHARGE OR ITCHING   Items with * indicate a potential emergency and should be followed up as soon as possible or go to the Emergency Department if any problems should occur.  Please show the CHEMOTHERAPY ALERT CARD or IMMUNOTHERAPY ALERT CARD at  check-in to the Emergency Department and triage nurse.  Should you have questions after your visit or need to cancel or reschedule your appointment, please contact [facility]  Dept: [phone]  and follow the prompts.  Office hours are 8:00 a.m. to 4:30 p.m. Monday - Friday. Please note that voicemails left after 4:00 p.m. may not be returned until the following business day.  We are closed weekends and major holidays. You have access to a nurse at all times for urgent questions. Please call the main number to the clinic Dept: [phone] and follow the prompts.   For any non-urgent questions, you may also contact your provider using MyChart. We now offer e-Visits for anyone 73 and older to request care online for non-urgent symptoms. For details visit [email or web address].   Also download the MyChart app! Go to the app store, search "MyChart", open the app, select [facility], and log in with your MyChart username and password.

## 2022-03-18 NOTE — Progress Notes (Signed)
Patient Care Team: Pcp, No as PCP - General [name], RN as Oncology Nurse Navigator Mauro Kaufmann, RN as Oncology Nurse Navigator [name], MD as Consulting Physician (General Surgery) Nicholas Lose, MD as Consulting Physician (Hematology and Oncology) Kyung Rudd, MD as Consulting Physician (Radiation Oncology)  DIAGNOSIS:  Encounter Diagnosis  Name Primary?   Malignant neoplasm of lower-outer quadrant of left breast of female, estrogen receptor negative ([place]) Yes    SUMMARY OF ONCOLOGIC HISTORY: Oncology History  Malignant neoplasm of lower-outer quadrant of left breast of female, estrogen receptor negative ([place])  09/07/2021 Initial Diagnosis   Screening mammogram detected left breast asymmetry posteriorly.  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  Report date is September 26, 2021 ([facility]) and September 29, 2021 (expanded).    The CancerNext-Expanded gene panel offered by [facility] and includes sequencing, rearrangement, and RNA analysis for the following 77 genes: AIP, ALK, APC, ATM, AXIN2, BAP1, BARD1, BLM, BMPR1A, BRCA1, BRCA2, BRIP1, CDC73, CDH1, CDK4, CDKN1B, CDKN2A, CHEK2, CTNNA1, DICER1, FANCC, FH, FLCN, GALNT12, KIF1B, LZTR1, MAX, MEN1, MET, MLH1, MSH2, MSH3, MSH6, MUTYH, NBN, NF1, NF2, NTHL1, PALB2, PHOX2B, PMS2, POT1, PRKAR1A, PTCH1, PTEN, RAD51C, RAD51D, RB1, RECQL, RET, SDHA, SDHAF2, SDHB, SDHC, SDHD, SMAD4, SMARCA4, SMARCB1, SMARCE1, STK11, SUFU, TMEM127, TP53,  TSC1, TSC2, VHL and XRCC2 (sequencing and deletion/duplication); EGFR, EGLN1, HOXB13, KIT, MITF, PDGFRA, POLD1, and POLE (sequencing only); EPCAM and GREM1 (deletion/duplication only).    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 female with the above-mentioned left breast cancer. She presents to the clinic for a follow-up and cycle 2 Taxol.  She had a abscess which was drained and she is starting to feel better.  She is here to resume her chemotherapy.  She had been off treatment for a full 4 weeks.   ALLERGIES:  has No Known Allergies.  MEDICATIONS:  Current Outpatient Medications  Medication Sig Dispense Refill   acetaminophen (TYLENOL) 500 MG tablet Take 500-1,000 mg by mouth every 6 (six) hours as needed (pain.).     dexamethasone (DECADRON) 4 MG tablet Take 1 tablet (4 mg total) by mouth daily. Take one tablet by mouth in the morning for two days after chemotherapy. 8 tablet 0   diazepam (VALIUM) 2 MG tablet Take 1 tablet (2 mg total) by mouth every 12 (twelve) hours as needed for up to 20 doses for muscle spasms. 20 tablet 0   nortriptyline (PAMELOR) 10 MG capsule Take 1 capsule (10 mg total) by mouth at bedtime. 30 capsule 5   ondansetron (ZOFRAN) 4 MG tablet Take 1 tablet (4 mg total) by  mouth every 8 (eight) hours as needed for up to 20 doses for nausea or vomiting. 20 tablet 0   ondansetron (ZOFRAN) 8 MG tablet Take 1 tab (8 mg) by mouth every 8 hrs as needed for nausea/vomiting. Start third day after doxorubicin/cyclophosphamide chemotherapy. 30 tablet 1   pegfilgrastim-cbqv (UDENYCA) 6 MG/0.6ML injection  INJECT THE CONTENTS OF 1 SYRINGE (6 MG) UNDER THE SKIN ONCE EVERY 14 DAYS AFTER CHEMOTHERAPY 0.6 mL 0   prochlorperazine (COMPAZINE) 10 MG tablet Take 1 tablet (10 mg total) by mouth every 6 (six) hours as needed for nausea or vomiting. 30 tablet 1   SUMAtriptan (IMITREX) 50 MG tablet TAKE 1 TAB BY MOUTH AS NEEDED FOR MIGRAINE. MAX 2 TABS IN 24 HOURS. MAY REPEAT IN 2 HOURS IF HEADACHE PERSISTS OR RECURS. 30 tablet 1   UBRELVY 100 MG TABS TAKE AS NEEDED (TAKE 1 AT THE EARLIST ONSET OF A MIGRAINE. MAY REPEAT IN 2 HOURS. MAX 2/24 HOURS). 30 tablet 1   Zavegepant HCl (ZAVZPRET) 10 MG/ACT SOLN Place 10 mg into the nose daily as needed. 6 each 11   No current facility-administered medications for this visit.    PHYSICAL EXAMINATION: ECOG PERFORMANCE STATUS: 1 - Symptomatic but completely ambulatory  Vitals:   03/18/22 1102  BP: (!) 142/92  Pulse: 72  Resp: 18  Temp: 97.7 F (36.5 C)  SpO2: 100%   Filed Weights   03/18/22 1102  Weight: 117 lb 3 oz (53.2 kg)      LABORATORY DATA:  I have reviewed the data as listed    Latest Ref Rng & Units 02/11/2022   11:07 AM 01/27/2022    1:43 PM 01/14/2022   11:37 AM  CMP  Glucose 70 - 99 mg/dL 84  92  91   BUN 6 - 20 mg/dL 14  11  8   $ Creatinine 0.44 - 1.00 mg/dL 0.46  0.59  0.54   Sodium 135 - 145 mmol/L 139  138  140   Potassium 3.5 - 5.1 mmol/L 3.7  3.6  4.1   Chloride 98 - 111 mmol/L 104  104  104   CO2 22 - 32 mmol/L 28  27  30   $ Calcium 8.9 - 10.3 mg/dL 9.4  9.3  9.8   Total Protein 6.5 - 8.1 g/dL 6.9  6.9  6.8   Total Bilirubin 0.3 - 1.2 mg/dL 0.3  0.4  0.3   Alkaline Phos 38 - 126 U/L 76  79  92   AST 15 - 41 U/L 14  16  23   $ ALT 0 - 44 U/L 9  11  15     $ Lab Results  Component Value Date   WBC 3.8 (L) 03/18/2022   HGB 11.1 (L) 03/18/2022   HCT 33.1 (L) 03/18/2022   MCV 97.9 03/18/2022   PLT 238 03/18/2022   NEUTROABS 1.7 03/18/2022    ASSESSMENT & PLAN:  Malignant neoplasm of lower-outer quadrant of left breast of  female, estrogen receptor negative ([place]) 09/07/2021:Screening mammogram detected left breast asymmetry posteriorly.  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, margins negative, 0/5 sentinel lymph nodes negative, ER 0%, PR 0%, HER2 negative, Ki-67 70%   Treatment plan: Adjuvant chemotherapy with Adriamycin and Cytoxan followed by Taxol started 12/03/2021 No  role of adjuvant radiation or antiestrogen therapy --------------------------------------------------------------------------------------------------------------------------------------------------------------------- Current treatment: Completed 4 cycles of dose dense Adriamycin and Cytoxan, today cycle 2 Taxol Echocardiogram 09/22/2021: EF 60 to 65%   Chemotoxicities: Nausea: Severe for 2 to 3 days after chemo  fatigue Chemotherapy-induced anemia: Today's hemoglobin is 11.1   Abscess of the breast which has been drained which led to the delay in continuing her chemotherapy. Breast infection: treated with antibiotics She will need a oophorectomy once chemotherapy is completed.   Return to clinic in weekly for Taxol treatments.    No orders of the defined types were placed in this encounter.  The patient has a good understanding of the overall plan. she agrees with it. she will call with any problems that may develop before the next visit here. Total time spent: 30 mins including face to face time and time spent for planning, charting and co-ordination of care   Harriette Ohara, MD 03/18/22    I Gardiner Coins am acting as a Education administrator for [employer]  I have reviewed the above documentation for accuracy and completeness, and I agree with the above.

## 2022-03-23 ENCOUNTER — Encounter: Payer: Self-pay | Admitting: Plastic Surgery

## 2022-03-23 ENCOUNTER — Ambulatory Visit (INDEPENDENT_AMBULATORY_CARE_PROVIDER_SITE_OTHER): Payer: Commercial Managed Care - HMO | Admitting: Plastic Surgery

## 2022-03-23 DIAGNOSIS — Z171 Estrogen receptor negative status [ER-]: Secondary | ICD-10-CM

## 2022-03-23 DIAGNOSIS — C50512 Malignant neoplasm of lower-outer quadrant of left female breast: Secondary | ICD-10-CM

## 2022-03-23 NOTE — Progress Notes (Signed)
   Subjective:    Patient ID: Julie Townsend, female    DOB: Mar 18, 1965, 58 y.o.   MRN: [id number]  The patient is a 45 old female here for follow-up after going to the OR on February 12 for evacuation of seroma.  A drain was placed.  Review of her history: The patient was diagnosed with breast cancer in send just in September 2023.  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 Ninfa Linden.  She is 5 feet 2 inches tall and weighs 113 pounds.  She underwent bilateral mastectomies with expander placement in September 2023.  She had an accident that caused an injury to the right breast and the expander was removed in November 2023.  Unfortunately this area continued to accumulate fluid and she was taken for evacuation of the fluid February 12.     Review of Systems  Constitutional: Negative.   Eyes: Negative.   Respiratory: Negative.    Cardiovascular: Negative.        Objective:   Physical Exam Cardiovascular:     Rate and Rhythm: Normal rate.     Pulses: Normal pulses.  Skin:    Capillary Refill: Capillary refill takes less than 2 seconds.  Neurological:     Mental Status: She is alert and oriented to person, place, and time.  Psychiatric:        Mood and Affect: Mood normal.        Behavior: Behavior normal.        Thought Content: Thought content normal.        Judgment: Judgment normal.         Assessment & Plan:     ICD-10-CM   1. Malignant neoplasm of lower-outer quadrant of left breast of female, estrogen receptor negative ([place])  C50.512    Z17.1        I was able to remove the drain.  I put some donated myriad on the open area of the medial right breast.  When that absorbs I gave her piece of silver resting to put on.  I would like to see her back in 10 days to 2 weeks.  I also sent her some pictures of latissimus muscle flap that I got from the Internet they are not my patients.

## 2022-03-24 MED FILL — Dexamethasone Sodium Phosphate Inj 100 MG/10ML: INTRAMUSCULAR | Qty: 1 | Status: AC

## 2022-03-25 ENCOUNTER — Other Ambulatory Visit: Payer: Self-pay

## 2022-03-25 ENCOUNTER — Inpatient Hospital Stay: Payer: Commercial Managed Care - HMO

## 2022-03-25 ENCOUNTER — Encounter: Payer: Self-pay | Admitting: Adult Health

## 2022-03-25 ENCOUNTER — Inpatient Hospital Stay: Payer: Commercial Managed Care - HMO | Admitting: Hematology and Oncology

## 2022-03-25 ENCOUNTER — Inpatient Hospital Stay: Payer: Commercial Managed Care - HMO | Admitting: Adult Health

## 2022-03-25 DIAGNOSIS — Z171 Estrogen receptor negative status [ER-]: Secondary | ICD-10-CM

## 2022-03-25 DIAGNOSIS — Z5111 Encounter for antineoplastic chemotherapy: Secondary | ICD-10-CM | POA: Diagnosis not present

## 2022-03-25 DIAGNOSIS — Z95828 Presence of other vascular implants and grafts: Secondary | ICD-10-CM

## 2022-03-25 DIAGNOSIS — C50512 Malignant neoplasm of lower-outer quadrant of left female breast: Secondary | ICD-10-CM

## 2022-03-25 LAB — CMP (CANCER CENTER ONLY)
ALT: 21 U/L (ref 0–44)
AST: 25 U/L (ref 15–41)
Albumin: 4.2 g/dL (ref 3.5–5.0)
Alkaline Phosphatase: 67 U/L (ref 38–126)
Anion gap: 6 (ref 5–15)
BUN: 9 mg/dL (ref 6–20)
CO2: 29 mmol/L (ref 22–32)
Calcium: 9.3 mg/dL (ref 8.9–10.3)
Chloride: 103 mmol/L (ref 98–111)
Creatinine: 0.51 mg/dL (ref 0.44–1.00)
GFR, Estimated: >60 mL/min (ref >60)
Glucose, Bld: 93 mg/dL (ref 70–99)
Potassium: 4.2 mmol/L (ref 3.5–5.1)
Sodium: 138 mmol/L (ref 135–145)
Total Bilirubin: 0.3 mg/dL (ref 0.3–1.2)
Total Protein: 6.9 g/dL (ref 6.5–8.1)

## 2022-03-25 LAB — CBC WITH DIFFERENTIAL (CANCER CENTER ONLY)
Abs Immature Granulocytes: 0.01 10*3/uL (ref 0.00–0.07)
Basophils Absolute: 0 10*3/uL (ref 0.0–0.1)
Basophils Relative: 1 %
Eosinophils Absolute: 0.2 10*3/uL (ref 0.0–0.5)
Eosinophils Relative: 5 %
HCT: 34.3 % — ABNORMAL LOW (ref 36.0–46.0)
Hemoglobin: 11.6 g/dL — ABNORMAL LOW (ref 12.0–15.0)
Immature Granulocytes: 0 %
Lymphocytes Relative: 38 %
Lymphs Abs: 1.2 10*3/uL (ref 0.7–4.0)
MCH: 33.1 pg (ref 26.0–34.0)
MCHC: 33.8 g/dL (ref 30.0–36.0)
MCV: 98 fL (ref 80.0–100.0)
Monocytes Absolute: 0.2 10*3/uL (ref 0.1–1.0)
Monocytes Relative: 6 %
Neutro Abs: 1.5 10*3/uL — ABNORMAL LOW (ref 1.7–7.7)
Neutrophils Relative %: 50 %
Platelet Count: 246 10*3/uL (ref 150–400)
RBC: 3.5 MIL/uL — ABNORMAL LOW (ref 3.87–5.11)
RDW: 13.7 % (ref 11.5–15.5)
WBC Count: 3.1 10*3/uL — ABNORMAL LOW (ref 4.0–10.5)
nRBC: 0 % (ref 0.0–0.2)

## 2022-03-25 MED ORDER — SODIUM CHLORIDE 0.9 % IV SOLN: Freq: Once | INTRAVENOUS | Status: AC

## 2022-03-25 MED ORDER — SODIUM CHLORIDE 0.9% FLUSH
10.0000 mL | INTRAVENOUS | Status: DC | PRN
  Administered 2022-03-25: 10 mL

## 2022-03-25 MED ORDER — SODIUM CHLORIDE 0.9% FLUSH
10.0000 mL | Freq: Once | INTRAVENOUS | Status: AC
  Administered 2022-03-25: 10 mL

## 2022-03-25 MED ORDER — SODIUM CHLORIDE 0.9 % IV SOLN
80.0000 mg/m2 | Freq: Once | INTRAVENOUS | Status: AC
  Administered 2022-03-25: 120 mg via INTRAVENOUS
  Filled 2022-03-25: qty 20

## 2022-03-25 MED ORDER — HEPARIN SOD (PORK) LOCK FLUSH 100 UNIT/ML IV SOLN
500.0000 [IU] | Freq: Once | INTRAVENOUS | Status: AC | PRN
  Administered 2022-03-25: 500 [IU]

## 2022-03-25 MED ORDER — FAMOTIDINE IN NACL 20-0.9 MG/50ML-% IV SOLN
20.0000 mg | Freq: Once | INTRAVENOUS | Status: AC
  Administered 2022-03-25: 20 mg via INTRAVENOUS
  Filled 2022-03-25: qty 50

## 2022-03-25 MED ORDER — PALONOSETRON HCL INJECTION 0.25 MG/5ML
0.2500 mg | Freq: Once | INTRAVENOUS | Status: AC
  Administered 2022-03-25: 0.25 mg via INTRAVENOUS
  Filled 2022-03-25: qty 5

## 2022-03-25 MED ORDER — DIPHENHYDRAMINE HCL 50 MG/ML IJ SOLN
25.0000 mg | Freq: Once | INTRAMUSCULAR | Status: AC
  Administered 2022-03-25: 25 mg via INTRAVENOUS
  Filled 2022-03-25: qty 1

## 2022-03-25 MED ORDER — SODIUM CHLORIDE 0.9 % IV SOLN
10.0000 mg | Freq: Once | INTRAVENOUS | Status: AC
  Administered 2022-03-25: 10 mg via INTRAVENOUS
  Filled 2022-03-25: qty 1
  Filled 2022-03-25: qty 10

## 2022-03-25 NOTE — Assessment & Plan Note (Signed)
Julie Townsend is a 57 year old woman with history of stage Ib triple negative breast cancer diagnosed in August 2023 status post bilateral mastectomies followed by adjuvant chemotherapy with dose dense Adriamycin and Cytoxan every 2 weeks x 4 cycles now being followed by weekly paclitaxel.  Her labs are stable today and she will proceed with her next cycle of weekly Taxol.  She is cleared to proceed with the [place] of 1.5.  This can certainly fluctuate and we will see what it is next week if it continues to decline would consider adding Neupogen or bio similar.  I reviewed this with them today.  Her breast wound has continued to heal since the debridement and washout that she underwent with plastic surgery last week.  We will see her back next week for labs, follow-up, and her next treatment.  She knows to call for any questions or concerns that may arise between now and her next visit.

## 2022-03-25 NOTE — Progress Notes (Signed)
[name] Follow up:    Pcp, No No address on file   DIAGNOSIS:  Cancer Staging  Malignant neoplasm of lower-outer quadrant of left breast of female, estrogen receptor negative ([place]) Staging form: Breast, AJCC 8th Edition - Clinical stage from 09/14/2021: Stage IB (cT1c, cN0, cM0, G3, ER-, PR-, HER2-) - Signed by Hayden Pedro, PA-C on 09/14/2021 Stage prefix: Initial diagnosis Method of lymph node assessment: Clinical Histologic grading system: 3 grade system   SUMMARY OF ONCOLOGIC HISTORY: Oncology History  Malignant neoplasm of lower-outer quadrant of left breast of female, estrogen receptor negative ([place])  09/07/2021 Initial Diagnosis   Screening mammogram detected left breast asymmetry posteriorly.  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  Report date is September 26, 2021 ([place]) and September 29, 2021 (expanded).    The CancerNext-Expanded gene panel offered by [facility] and includes sequencing, rearrangement, and RNA analysis for the following 77 genes: AIP, ALK, APC, ATM, AXIN2, BAP1, BARD1, BLM, BMPR1A, BRCA1, BRCA2, BRIP1, CDC73, CDH1, CDK4, CDKN1B, CDKN2A, CHEK2, CTNNA1, DICER1, FANCC, FH, FLCN, GALNT12, KIF1B, LZTR1, MAX, MEN1, MET, MLH1, MSH2, MSH3, MSH6, MUTYH, NBN, NF1, NF2, NTHL1, PALB2, PHOX2B, PMS2, POT1, PRKAR1A, PTCH1, PTEN, RAD51C, RAD51D, RB1, RECQL, RET, SDHA, SDHAF2, SDHB, SDHC, SDHD, SMAD4, SMARCA4, SMARCB1, SMARCE1, STK11, SUFU, TMEM127,  TP53, TSC1, TSC2, VHL and XRCC2 (sequencing and deletion/duplication); EGFR, EGLN1, HOXB13, KIT, MITF, PDGFRA, POLD1, and POLE (sequencing only); EPCAM and GREM1 (deletion/duplication only).    10/29/2021 Surgery   Bilateral mastectomies Right mastectomy: Benign, PASH Left mastectomy: Grade 3 IDC with DCIS 1.2 cm, margins negative, 0/5 sentinel lymph nodes negative, ER 0%, PR 0%, HER2 negative, Ki-67 70%   12/03/2021 -  Chemotherapy   Patient is on Treatment Plan : BREAST ADJUVANT DOSE DENSE AC q14d / PACLitaxel q7d       CURRENT THERAPY: taxol  INTERVAL HISTORY: Julie Townsend 57 y.o. female returns for labs and follow-up prior to receiving Taxol chemotherapy.  She is doing well today.  She did undergo breast debridement last week for breast infection.  She is feeling much better since she underwent this and tells me her wound is dressed and is in the process of healing well.  She is changing her own dressings and has not noted any signs of infection.  She denies any fatigue or neuropathy.   Patient Active Problem List   Diagnosis Date Noted   Cellulitis of right breast 03/10/2022   Medication management 03/10/2022   Port-A-Cath in place 03/10/2022   Breast cancer ([place]) 10/29/2021   BRCA2 gene mutation positive 09/26/2021   Genetic testing 09/26/2021   Family history of breast cancer 09/22/2021   Family history of pancreatic cancer 09/22/2021   Family history of ovarian cancer 09/22/2021   Malignant neoplasm of lower-outer quadrant of left breast of female, estrogen receptor negative ([place]) 09/11/2021    has No Known Allergies.  MEDICAL HISTORY: Past Medical History:  Diagnosis Date   BRCA2 gene mutation positive 09/26/2021   Breast cancer (  [place]) 09/2021   left breast IDC   Family history of breast cancer 09/22/2021   Family history of ovarian cancer 09/22/2021   Family history of pancreatic cancer 09/22/2021   Genetic testing 09/26/2021   Pathogenic variant in BRCA2  at p.[id number]* (c.3922G>T).  Report date is September 26, 2021.    The BRCAplus panel offered by [employer] and includes sequencing and deletion/duplication analysis for the following 8 genes: ATM, BRCA1, BRCA2, CDH1, CHEK2, PALB2, PTEN, and TP53.  Results of pan-cancer panel are pending.     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    correct Hands    SURGICAL HISTORY: Past Surgical History:  Procedure Laterality Date   BREAST IMPLANT REMOVAL Right 12/17/2021   Procedure: Right breast expander removal with washout;  Surgeon: Wallace Going, DO;  Location: [facility];  Service: Plastics;  Laterality: Right;   BREAST RECONSTRUCTION WITH PLACEMENT OF TISSUE EXPANDER AND FLEX HD (ACELLULAR HYDRATED DERMIS) Bilateral 10/29/2021   Procedure: BREAST RECONSTRUCTION WITH PLACEMENT OF TISSUE EXPANDER AND FLEX HD (ACELLULAR HYDRATED DERMIS);  Surgeon: Wallace Going, DO;  Location: [place];  Service: Plastics;  Laterality: Bilateral;   CARPAL TUNNEL RELEASE Right 06/20/2020   Procedure: CARPAL TUNNEL RELEASE;  Surgeon: [name], MD;  Location: [facility];  Service: Orthopedics;  Laterality: Right;   IRRIGATION AND DEBRIDEMENT OF WOUND WITH SPLIT THICKNESS SKIN GRAFT Right 03/15/2022   Procedure: Debridement of right breast;  Surgeon: Wallace Going, DO;  Location: [facility];  Service: Plastics;  Laterality: Right;   MASTECTOMY W/ SENTINEL NODE BIOPSY Left 10/29/2021   Procedure: LEFT MASTECTOMY WITH LEFT SENTINEL LYMPH NODE BIOPSY;  Surgeon: [name], MD;  Location: [facility];  Service: General;  Laterality: Left;   PORTACATH PLACEMENT Right 10/29/2021   Procedure: INSERTION PORT-A-CATH;  Surgeon: [name], MD;  Location: [facility];  Service: General;  Laterality: Right;   TOTAL MASTECTOMY Right 10/29/2021   Procedure: RIGHT TOTAL MASTECTOMY;  Surgeon: [name],  MD;  Location: [facility];  Service: General;  Laterality: Right;   TRIGGER FINGER RELEASE Left 05/08/2020   Procedure: LEFT LONG FINGER TRIGGER RELEASE AND LEFT RING FINGER TRIGGER RELEASE;  Surgeon: [name], MD;  Location: [facility];  Service: Orthopedics;  Laterality: Left;   trigger finger release rt hand      SOCIAL HISTORY: Social History   Socioeconomic History   Marital status: Married    Spouse name: Not on file   Number of children: Not on file   Years of education: Not on file   Highest education level: Not on file  Occupational History   Not on file  Tobacco Use   Smoking status: Never    Passive exposure: Never   Smokeless tobacco: Never  Vaping Use   Vaping Use: Never used  Substance and Sexual Activity   Alcohol use: Never   Drug use: Never   Sexual activity: Yes    Birth control/protection: Post-menopausal  Other Topics Concern   Not on file  Social History Narrative   Lives with husband.   Right handed   Social Determinants of Health   Financial Resource Strain: Not on file  Food Insecurity: Not on file  Transportation Needs: Not on file  Physical Activity: Not on file  Stress: Not on file  Social Connections: Not on file  Intimate Partner Violence: Not on file  FAMILY HISTORY: Family History  Problem Relation Age of Onset   Migraines Mother    Breast cancer Mother 68       recurr at  60   Migraines Maternal Aunt    Colon cancer Maternal Uncle        dx late 60s   Pancreatic cancer Maternal Uncle 28   Migraines Maternal Grandmother    Ovarian cancer Maternal Grandmother        dx 63s    Review of Systems  Constitutional:  Negative for appetite change, chills, fatigue, fever and unexpected weight change.  HENT:   Negative for hearing loss, lump/mass and trouble swallowing.   Eyes:  Negative for eye problems and icterus.  Respiratory:  Negative for chest tightness, cough and shortness of breath.    Cardiovascular:  Negative for chest pain, leg swelling and palpitations.  Gastrointestinal:  Negative for abdominal distention, abdominal pain, constipation, diarrhea, nausea and vomiting.  Endocrine: Negative for hot flashes.  Genitourinary:  Negative for difficulty urinating.   Musculoskeletal:  Negative for arthralgias.  Skin:  Positive for wound (This is stress per patient and there are no signs of infection.). Negative for itching and rash.  Neurological:  Negative for dizziness, extremity weakness, headaches and numbness.  Hematological:  Negative for adenopathy. Does not bruise/bleed easily.  Psychiatric/Behavioral:  Negative for depression. The patient is not nervous/anxious.       PHYSICAL EXAMINATION  ECOG PERFORMANCE STATUS: 1 - Symptomatic but completely ambulatory  Vitals:   03/25/22 1210  BP: 121/79  Pulse: 78  Resp: 18  Temp: 97.7 F (36.5 C)  SpO2: 100%    Physical Exam Constitutional:      General: She is not in acute distress.    Appearance: Normal appearance. She is not toxic-appearing.  HENT:     Head: Normocephalic and atraumatic.  Eyes:     General: No scleral icterus. Cardiovascular:     Rate and Rhythm: Normal rate and regular rhythm.     Pulses: Normal pulses.     Heart sounds: Normal heart sounds.  Pulmonary:     Effort: Pulmonary effort is normal.     Breath sounds: Normal breath sounds.  Abdominal:     General: Abdomen is flat. Bowel sounds are normal. There is no distension.     Palpations: Abdomen is soft.     Tenderness: There is no abdominal tenderness.  Musculoskeletal:        General: No swelling.     Cervical back: Neck supple.  Lymphadenopathy:     Cervical: No cervical adenopathy.  Skin:    General: Skin is warm and dry.     Findings: No rash.  Neurological:     General: No focal deficit present.     Mental Status: She is alert.  Psychiatric:        Mood and Affect: Mood normal.        Behavior: Behavior normal.      LABORATORY DATA:  CBC    Component Value Date/Time   WBC 3.1 (L) 03/25/2022 1139   WBC 3.7 (L) 03/10/2022 1148   RBC 3.50 (L) 03/25/2022 1139   HGB 11.6 (L) 03/25/2022 1139   HCT 34.3 (L) 03/25/2022 1139   PLT 246 03/25/2022 1139   MCV 98.0 03/25/2022 1139   MCH 33.1 03/25/2022 1139   MCHC 33.8 03/25/2022 1139   RDW 13.7 03/25/2022 1139   LYMPHSABS 1.2 03/25/2022 1139   MONOABS 0.2 03/25/2022 1139   EOSABS 0.2  03/25/2022 1139   BASOSABS 0.0 03/25/2022 1139    CMP     Component Value Date/Time   NA 138 03/25/2022 1139   K 4.2 03/25/2022 1139   CL 103 03/25/2022 1139   CO2 29 03/25/2022 1139   GLUCOSE 93 03/25/2022 1139   BUN 9 03/25/2022 1139   CREATININE 0.51 03/25/2022 1139   CALCIUM 9.3 03/25/2022 1139   PROT 6.9 03/25/2022 1139   ALBUMIN 4.2 03/25/2022 1139   AST 25 03/25/2022 1139   ALT 21 03/25/2022 1139   ALKPHOS 67 03/25/2022 1139   BILITOT 0.3 03/25/2022 1139   GFRNONAA >60 03/25/2022 1139     ASSESSMENT and THERAPY PLAN:   Malignant neoplasm of lower-outer quadrant of left breast of female, estrogen receptor negative (HCC) Crystiana is a 57 year old woman with history of stage Ib triple negative breast cancer diagnosed in August 2023 status post bilateral mastectomies followed by adjuvant chemotherapy with dose dense Adriamycin and Cytoxan every 2 weeks x 4 cycles now being followed by weekly paclitaxel.  Her labs are stable today and she will proceed with her next cycle of weekly Taxol.  She is cleared to proceed with the [place] of 1.5.  This can certainly fluctuate and we will see what it is next week if it continues to decline would consider adding Neupogen or bio similar.  I reviewed this with them today.  Her breast wound has continued to heal since the debridement and washout that she underwent with plastic surgery last week.  We will see her back next week for labs, follow-up, and her next treatment.  She knows to call for any questions or concerns  that may arise between now and her next visit.   All questions were answered. The patient knows to call the clinic with any problems, questions or concerns. We can certainly see the patient much sooner if necessary.  Total encounter time: 20 minutes*in face-to-face visit time, chart review, lab review, care coordination, order entry, and documentation of the encounter time.  Wilber Bihari, NP 03/25/22 12:58 PM Medical Oncology and Hematology [facility] [place], [place] [address] Tel. [phone]    Fax. [phone]  *Total Encounter Time as defined by the Centers for Medicare and Medicaid Services includes, in addition to the face-to-face time of a patient visit (documented in the note above) non-face-to-face time: obtaining and reviewing outside history, ordering and reviewing medications, tests or procedures, care coordination (communications with other health care professionals or caregivers) and documentation in the medical record.

## 2022-03-25 NOTE — Patient Instructions (Signed)
[facility]  Discharge Instructions: Thank you for choosing [facility] to provide your oncology and hematology care.   If you have a lab appointment with the [facility], please go directly to the [facility] and check in at the registration area.   Wear comfortable clothing and clothing appropriate for easy access to any Portacath or PICC line.   We strive to give you quality time with your provider. You may need to reschedule your appointment if you arrive late (15 or more minutes).  Arriving late affects you and other patients whose appointments are after yours.  Also, if you miss three or more appointments without notifying the office, you may be dismissed from the clinic at the provider's discretion.      For prescription refill requests, have your pharmacy contact our office and allow 72 hours for refills to be completed.    Today you received the following chemotherapy and/or immunotherapy agents: paclitaxel      To help prevent nausea and vomiting after your treatment, we encourage you to take your nausea medication as directed.  BELOW ARE SYMPTOMS THAT SHOULD BE REPORTED IMMEDIATELY: *FEVER GREATER THAN 100.4 F (38 C) OR HIGHER *CHILLS OR SWEATING *NAUSEA AND VOMITING THAT IS NOT CONTROLLED WITH YOUR NAUSEA MEDICATION *UNUSUAL SHORTNESS OF BREATH *UNUSUAL BRUISING OR BLEEDING *URINARY PROBLEMS (pain or burning when urinating, or frequent urination) *BOWEL PROBLEMS (unusual diarrhea, constipation, pain near the anus) TENDERNESS IN MOUTH AND THROAT WITH OR WITHOUT PRESENCE OF ULCERS (sore throat, sores in mouth, or a toothache) UNUSUAL RASH, SWELLING OR PAIN  UNUSUAL VAGINAL DISCHARGE OR ITCHING   Items with * indicate a potential emergency and should be followed up as soon as possible or go to the Emergency Department if any problems should occur.  Please show the CHEMOTHERAPY ALERT CARD or IMMUNOTHERAPY ALERT CARD at  check-in to the Emergency Department and triage nurse.  Should you have questions after your visit or need to cancel or reschedule your appointment, please contact [facility]  Dept: [phone]  and follow the prompts.  Office hours are 8:00 a.m. to 4:30 p.m. Monday - Friday. Please note that voicemails left after 4:00 p.m. may not be returned until the following business day.  We are closed weekends and major holidays. You have access to a nurse at all times for urgent questions. Please call the main number to the clinic Dept: [phone] and follow the prompts.   For any non-urgent questions, you may also contact your provider using MyChart. We now offer e-Visits for anyone 18 and older to request care online for non-urgent symptoms. For details visit [email or web address].   Also download the MyChart app! Go to the app store, search "MyChart", open the app, select [facility], and log in with your MyChart username and password.

## 2022-03-30 NOTE — Progress Notes (Unsigned)
Patient is a pleasant 57 year old female with PMH of left-sided breast cancer s/p bilateral mastectomy with immediate reconstruction using tissue expander and Flex HD performed 10/29/2021 and subsequent right-sided infection and expander removal 12/17/2021 by Dr. Marla Roe.  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  Reviewed operative report and Cellerate was placed at medial aspect of mastectomy incision where it was not fully closed primarily.    She was last seen here in clinic on 03/23/2022.  At that time, drain was removed without complication or difficulty.  Donated myriad was placed on the open area of the medial right breast.  Plan was for her to return in 10 to 14 days.  She was also provided with pictures of latissimus muscle dorsi flap reconstruction for her to review because that would likely be required if patient would like to continue with reconstructive process.  Today, she is accompanied by her husband at bedside.  She tells me that she has been dressing the wound with silver alginate dressing followed by a small amount of K-Y jelly and a Band-Aid.  She states that it is mildly tender in that area, but denies the significant constant and chronic aching pain that she had prior to the most recent surgery.  She also states that it has not gotten worse since time of surgery.  She says that it has remained flat and there has been no recurrence of fluid collection.  That said, she states that it is draining a mild to moderate amount of thin serous drainage daily from the shrinking wound.  She continues to deny any systemic symptoms.    On exam, she does have a 3 x 3 cm area of erythema over medial aspect of right side mastectomy site.  Wound is less than 1 cm in size, draining thin yellow serous drainage.  No cloudiness, malodor, or overt purulence.  Area is mildly tender.  It is still draining, but no obvious fluid collections or fluctuance.   Discussed with patient that my concern would be if when this wound heals superficially and is no longer able to drain that she could possibly develop recurrence of her previous seroma.  Will continue to apply silver calcium alginate dressings secured with a bandage and keep close observation of the wound.  Close outpatient follow-up, recommending weekly evaluations until there is considerable improvement.  She understands to call the office should she experience any worsening redness, swelling, change in character/volume of drainage, systemic symptoms, or worsening pain symptoms.  Picture(s) obtained of the patient and placed in the chart were with the patient's or guardian's permission.'

## 2022-03-31 ENCOUNTER — Ambulatory Visit (INDEPENDENT_AMBULATORY_CARE_PROVIDER_SITE_OTHER): Payer: Commercial Managed Care - HMO | Admitting: Physician Assistant

## 2022-03-31 DIAGNOSIS — Z171 Estrogen receptor negative status [ER-]: Secondary | ICD-10-CM

## 2022-03-31 DIAGNOSIS — C50512 Malignant neoplasm of lower-outer quadrant of left female breast: Secondary | ICD-10-CM

## 2022-03-31 MED FILL — Dexamethasone Sodium Phosphate Inj 100 MG/10ML: INTRAMUSCULAR | Qty: 1 | Status: AC

## 2022-03-31 NOTE — Progress Notes (Signed)
Patient Care Team: Pcp, No as PCP - General [name], RN as Oncology Nurse Navigator Mauro Kaufmann, RN as Oncology Nurse Navigator [name], MD as Consulting Physician (General Surgery) Nicholas Lose, MD as Consulting Physician (Hematology and Oncology) Kyung Rudd, MD as Consulting Physician (Radiation Oncology)  DIAGNOSIS: No diagnosis found.  SUMMARY OF ONCOLOGIC HISTORY: Oncology History  Malignant neoplasm of lower-outer quadrant of left breast of female, estrogen receptor negative ([place])  09/07/2021 Initial Diagnosis   Screening mammogram detected left breast asymmetry posteriorly.  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  Report date is September 26, 2021 ([place]) and September 29, 2021 (expanded).    The CancerNext-Expanded gene panel offered by [facility] and includes sequencing, rearrangement, and RNA analysis for the following 77 genes: AIP, ALK, APC, ATM, AXIN2, BAP1, BARD1, BLM, BMPR1A, BRCA1, BRCA2, BRIP1, CDC73, CDH1, CDK4, CDKN1B, CDKN2A, CHEK2, CTNNA1, DICER1, FANCC, FH, FLCN, GALNT12, KIF1B, LZTR1, MAX, MEN1, MET, MLH1, MSH2, MSH3, MSH6, MUTYH, NBN, NF1, NF2, NTHL1, PALB2, PHOX2B, PMS2, POT1, PRKAR1A, PTCH1, PTEN, RAD51C, RAD51D, RB1, RECQL, RET, SDHA, SDHAF2, SDHB, SDHC, SDHD, SMAD4, SMARCA4, SMARCB1, SMARCE1, STK11, SUFU, TMEM127, TP53, TSC1, TSC2, VHL and XRCC2 (sequencing and deletion/duplication); EGFR, EGLN1, HOXB13, KIT, MITF, PDGFRA, POLD1, and POLE  (sequencing only); EPCAM and GREM1 (deletion/duplication only).    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  MEDICATIONS:  Current Outpatient Medications  Medication Sig Dispense Refill   acetaminophen (TYLENOL) 500 MG tablet Take 500-1,000 mg by mouth every 6 (six) hours as needed (pain.).     dexamethasone (DECADRON) 4 MG tablet Take 1 tablet (4 mg total) by mouth daily. Take one tablet by mouth in the morning for two days after chemotherapy. 8 tablet 0   diazepam (VALIUM) 2 MG tablet Take 1 tablet (2 mg total) by mouth every 12 (twelve) hours as needed for up to 20 doses for muscle spasms. 20 tablet 0   nortriptyline (PAMELOR) 10 MG capsule Take 1 capsule (10 mg total) by mouth at bedtime. 30 capsule 5   ondansetron (ZOFRAN) 4 MG tablet Take 1 tablet (4 mg total) by mouth every 8 (eight) hours as needed for up to 20 doses for nausea or vomiting. 20 tablet 0   ondansetron (ZOFRAN) 8 MG tablet Take 1 tab (8 mg) by mouth every 8 hrs as needed for nausea/vomiting. Start third day after doxorubicin/cyclophosphamide chemotherapy. 30 tablet 1   pegfilgrastim-cbqv (UDENYCA) 6 MG/0.6ML injection INJECT THE CONTENTS OF 1 SYRINGE (6 MG) UNDER THE SKIN ONCE EVERY 14 DAYS AFTER CHEMOTHERAPY 0.6 mL 0   prochlorperazine (  COMPAZINE) 10 MG tablet Take 1 tablet (10 mg total) by mouth every 6 (six) hours as needed for nausea or vomiting. 30 tablet 1   SUMAtriptan (IMITREX) 50 MG tablet TAKE 1 TAB BY MOUTH AS NEEDED FOR MIGRAINE. MAX 2 TABS IN 24 HOURS. MAY REPEAT IN 2 HOURS IF HEADACHE PERSISTS OR RECURS. 30 tablet  1   UBRELVY 100 MG TABS TAKE AS NEEDED (TAKE 1 AT THE EARLIST ONSET OF A MIGRAINE. MAY REPEAT IN 2 HOURS. MAX 2/24 HOURS). 30 tablet 1   Zavegepant HCl (ZAVZPRET) 10 MG/ACT SOLN Place 10 mg into the nose daily as needed. 6 each 11   No current facility-administered medications for this visit.    PHYSICAL EXAMINATION: ECOG PERFORMANCE STATUS: {CHL ONC ECOG [phone]}  There were no vitals filed for this visit. There were no vitals filed for this visit.  BREAST:*** No palpable masses or nodules in either right or left breasts. No palpable axillary supraclavicular or infraclavicular adenopathy no breast tenderness or nipple discharge. (exam performed in the presence of a chaperone)  LABORATORY DATA:  I have reviewed the data as listed    Latest Ref Rng & Units 03/25/2022   11:39 AM 03/18/2022   10:38 AM 02/11/2022   11:07 AM  CMP  Glucose 70 - 99 mg/dL 93  85  84   BUN 6 - 20 mg/dL '9  11  14   '$ Creatinine 0.44 - 1.00 mg/dL 0.51  0.47  0.46   Sodium 135 - 145 mmol/L 138  140  139   Potassium 3.5 - 5.1 mmol/L 4.2  3.8  3.7   Chloride 98 - 111 mmol/L 103  105  104   CO2 22 - 32 mmol/L '29  29  28   '$ Calcium 8.9 - 10.3 mg/dL 9.3  9.4  9.4   Total Protein 6.5 - 8.1 g/dL 6.9  6.6  6.9   Total Bilirubin 0.3 - 1.2 mg/dL 0.3  0.4  0.3   Alkaline Phos 38 - 126 U/L 67  62  76   AST 15 - 41 U/L '25  19  14   '$ ALT 0 - 44 U/L '21  13  9     '$ Lab Results  Component Value Date   WBC 3.1 (L) 03/25/2022   HGB 11.6 (L) 03/25/2022   HCT 34.3 (L) 03/25/2022   MCV 98.0 03/25/2022   PLT 246 03/25/2022   NEUTROABS 1.5 (L) 03/25/2022    ASSESSMENT & PLAN:  No problem-specific Assessment & Plan notes found for this encounter.    No orders of the defined types were placed in this encounter.  The patient has a good understanding of the overall plan. she agrees with it. she will call with any problems that may develop before the next visit here. Total time spent: 30 mins including face to face time  and time spent for planning, charting and co-ordination of care   Suzzette Righter, [place] 03/31/22    I Gardiner Coins am acting as a Education administrator for [employer]  ***

## 2022-04-01 ENCOUNTER — Inpatient Hospital Stay: Payer: Commercial Managed Care - HMO

## 2022-04-01 ENCOUNTER — Other Ambulatory Visit: Payer: Self-pay

## 2022-04-01 ENCOUNTER — Inpatient Hospital Stay (HOSPITAL_BASED_OUTPATIENT_CLINIC_OR_DEPARTMENT_OTHER): Payer: Commercial Managed Care - HMO | Admitting: Hematology and Oncology

## 2022-04-01 VITALS — BP 123/85 | HR 90 | Temp 98.4°F | Resp 17

## 2022-04-01 VITALS — BP 135/92 | HR 80 | Temp 97.8°F | Resp 18 | Ht 62.0 in | Wt 116.6 lb

## 2022-04-01 DIAGNOSIS — C50512 Malignant neoplasm of lower-outer quadrant of left female breast: Secondary | ICD-10-CM

## 2022-04-01 DIAGNOSIS — Z171 Estrogen receptor negative status [ER-]: Secondary | ICD-10-CM

## 2022-04-01 DIAGNOSIS — Z5111 Encounter for antineoplastic chemotherapy: Secondary | ICD-10-CM | POA: Diagnosis not present

## 2022-04-01 DIAGNOSIS — Z95828 Presence of other vascular implants and grafts: Secondary | ICD-10-CM

## 2022-04-01 LAB — CMP (CANCER CENTER ONLY)
ALT: 12 U/L (ref 0–44)
AST: 18 U/L (ref 15–41)
Albumin: 4.2 g/dL (ref 3.5–5.0)
Alkaline Phosphatase: 60 U/L (ref 38–126)
Anion gap: 5 (ref 5–15)
BUN: 10 mg/dL (ref 6–20)
CO2: 28 mmol/L (ref 22–32)
Calcium: 9 mg/dL (ref 8.9–10.3)
Chloride: 106 mmol/L (ref 98–111)
Creatinine: 0.41 mg/dL — ABNORMAL LOW (ref 0.44–1.00)
GFR, Estimated: >60 mL/min (ref >60)
Glucose, Bld: 78 mg/dL (ref 70–99)
Potassium: 3.9 mmol/L (ref 3.5–5.1)
Sodium: 139 mmol/L (ref 135–145)
Total Bilirubin: 0.4 mg/dL (ref 0.3–1.2)
Total Protein: 6.3 g/dL — ABNORMAL LOW (ref 6.5–8.1)

## 2022-04-01 LAB — CBC WITH DIFFERENTIAL (CANCER CENTER ONLY)
Abs Immature Granulocytes: 0.01 10*3/uL (ref 0.00–0.07)
Basophils Absolute: 0 10*3/uL (ref 0.0–0.1)
Basophils Relative: 1 %
Eosinophils Absolute: 0.1 10*3/uL (ref 0.0–0.5)
Eosinophils Relative: 3 %
HCT: 30.8 % — ABNORMAL LOW (ref 36.0–46.0)
Hemoglobin: 10.5 g/dL — ABNORMAL LOW (ref 12.0–15.0)
Immature Granulocytes: 0 %
Lymphocytes Relative: 45 %
Lymphs Abs: 1.4 10*3/uL (ref 0.7–4.0)
MCH: 33 pg (ref 26.0–34.0)
MCHC: 34.1 g/dL (ref 30.0–36.0)
MCV: 96.9 fL (ref 80.0–100.0)
Monocytes Absolute: 0.2 10*3/uL (ref 0.1–1.0)
Monocytes Relative: 5 %
Neutro Abs: 1.4 10*3/uL — ABNORMAL LOW (ref 1.7–7.7)
Neutrophils Relative %: 46 %
Platelet Count: 216 10*3/uL (ref 150–400)
RBC: 3.18 MIL/uL — ABNORMAL LOW (ref 3.87–5.11)
RDW: 13.4 % (ref 11.5–15.5)
WBC Count: 3 10*3/uL — ABNORMAL LOW (ref 4.0–10.5)
nRBC: 0 % (ref 0.0–0.2)

## 2022-04-01 MED ORDER — DIPHENHYDRAMINE HCL 50 MG/ML IJ SOLN
25.0000 mg | Freq: Once | INTRAMUSCULAR | Status: AC
  Administered 2022-04-01: 25 mg via INTRAVENOUS
  Filled 2022-04-01: qty 1

## 2022-04-01 MED ORDER — SODIUM CHLORIDE 0.9% FLUSH
10.0000 mL | Freq: Once | INTRAVENOUS | Status: AC
  Administered 2022-04-01: 10 mL

## 2022-04-01 MED ORDER — SODIUM CHLORIDE 0.9% FLUSH
10.0000 mL | INTRAVENOUS | Status: DC | PRN
  Administered 2022-04-01: 10 mL

## 2022-04-01 MED ORDER — SODIUM CHLORIDE 0.9 % IV SOLN
80.0000 mg/m2 | Freq: Once | INTRAVENOUS | Status: AC
  Administered 2022-04-01: 120 mg via INTRAVENOUS
  Filled 2022-04-01: qty 20

## 2022-04-01 MED ORDER — FAMOTIDINE IN NACL 20-0.9 MG/50ML-% IV SOLN
20.0000 mg | Freq: Once | INTRAVENOUS | Status: AC
  Administered 2022-04-01: 20 mg via INTRAVENOUS
  Filled 2022-04-01: qty 50

## 2022-04-01 MED ORDER — SODIUM CHLORIDE 0.9 % IV SOLN
10.0000 mg | Freq: Once | INTRAVENOUS | Status: AC
  Administered 2022-04-01: 10 mg via INTRAVENOUS
  Filled 2022-04-01: qty 10

## 2022-04-01 MED ORDER — SODIUM CHLORIDE 0.9 % IV SOLN: Freq: Once | INTRAVENOUS | Status: AC

## 2022-04-01 MED ORDER — HEPARIN SOD (PORK) LOCK FLUSH 100 UNIT/ML IV SOLN
500.0000 [IU] | Freq: Once | INTRAVENOUS | Status: AC | PRN
  Administered 2022-04-01: 500 [IU]

## 2022-04-01 MED ORDER — PALONOSETRON HCL INJECTION 0.25 MG/5ML
0.2500 mg | Freq: Once | INTRAVENOUS | Status: AC
  Administered 2022-04-01: 0.25 mg via INTRAVENOUS
  Filled 2022-04-01: qty 5

## 2022-04-01 NOTE — Assessment & Plan Note (Signed)
09/07/2021:Screening mammogram detected left breast asymmetry posteriorly.  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, margins negative, 0/5 sentinel lymph nodes negative, ER 0%, PR 0%, HER2 negative, Ki-67 70%   Treatment plan: Adjuvant chemotherapy with Adriamycin and Cytoxan followed by Taxol started 12/03/2021 No role of adjuvant radiation or antiestrogen therapy --------------------------------------------------------------------------------------------------------------------------------------------------------------------- Current treatment: Completed 4 cycles of dose dense Adriamycin and Cytoxan, today cycle 8Taxol Echocardiogram 09/22/2021: EF 60 to 65%   Chemotoxicities: Nausea: Severe for 2 to 3 days after chemo  fatigue Chemotherapy-induced anemia: Today's hemoglobin is 11.1   Abscess of the breast which has been drained which led to the delay in continuing her chemotherapy. Breast infection: treated with antibiotics She will need a oophorectomy once chemotherapy is completed.   Return to clinic in weekly for Taxol treatments.

## 2022-04-01 NOTE — Progress Notes (Signed)
Per Dr Lindi Adie, ok to treat with ANC 1.4 today

## 2022-04-04 ENCOUNTER — Other Ambulatory Visit: Payer: Self-pay

## 2022-04-06 ENCOUNTER — Encounter: Payer: Commercial Managed Care - HMO | Admitting: Surgical

## 2022-04-06 NOTE — Progress Notes (Unsigned)
Patient is a pleasant 57 year old female with PMH of left-sided breast cancer s/p bilateral mastectomy with immediate reconstruction using tissue expander and Flex HD performed 10/29/2021 and subsequent right-sided infection and expander removal 12/17/2021 by Dr. Marla Roe.  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  Reviewed operative report and Cellerate was placed at medial aspect of mastectomy incision where it was not fully closed primarily.  She was last seen here in office on 03/31/2022.  At that time, 3 x 3 cm area of erythema over medial aspect right sided mastectomy site.  Wound is less than 1 cm in size, draining thin yellow serous drainage.  There was no obvious infection or purulence.  However, concerned that patient could have recurrence of seroma if wound closes completely and she is unable to drain.  Plan for continued silver calcium alginate dressings secured with a bandage and close observation.  Strict precautions provided.  Today, she is accompanied by her husband at bedside.  She tells me that it has only drained a couple times since last encounter and that overall the drainage has improved considerably since first postop week.  She continues to apply calcium alginate dressings secured with Band-Aid.  She does not feel as though the area of erythema has improved considerably since last appointment.  However, denies any worsening pain, swelling, redness, malodor, or other symptoms.  On exam, wound is less than 0.5 cm.  No expressible drainage.  Surrounding erythema measures 2 x 2.25 cm.  The area is tender to palpation.  No fluctuance or palpable subcutaneous fluid collection.  Remainder of reconstruction sites are without concern.  Continue with the once daily calcium alginate dressings secured with Band-Aid.  Return in 2 weeks.  She understands what to watch for when to call the clinic for earlier visit.  She has 8 more treatments of  chemotherapy before she is complete.  Picture(s) obtained of the patient and placed in the chart were with the patient's or guardian's permission.

## 2022-04-06 NOTE — Progress Notes (Signed)
Patient Care Team: Pcp, No as PCP - General [name], RN as Oncology Nurse Navigator Mauro Kaufmann, RN as Oncology Nurse Navigator [name], MD as Consulting Physician (General Surgery) Nicholas Lose, MD as Consulting Physician (Hematology and Oncology) Kyung Rudd, MD as Consulting Physician (Radiation Oncology)  DIAGNOSIS: No diagnosis found.  SUMMARY OF ONCOLOGIC HISTORY: Oncology History  Malignant neoplasm of lower-outer quadrant of left breast of female, estrogen receptor negative ([place])  09/07/2021 Initial Diagnosis   Screening mammogram detected left breast asymmetry posteriorly.  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  Report date is September 26, 2021 ([place]) and September 29, 2021 (expanded).    The CancerNext-Expanded gene panel offered by [facility] and includes sequencing, rearrangement, and RNA analysis for the following 77 genes: AIP, ALK, APC, ATM, AXIN2, BAP1, BARD1, BLM, BMPR1A, BRCA1, BRCA2, BRIP1, CDC73, CDH1, CDK4, CDKN1B, CDKN2A, CHEK2, CTNNA1, DICER1, FANCC, FH, FLCN, GALNT12, KIF1B, LZTR1, MAX, MEN1, MET, MLH1, MSH2, MSH3, MSH6, MUTYH, NBN, NF1, NF2, NTHL1, PALB2, PHOX2B, PMS2, POT1, PRKAR1A, PTCH1, PTEN, RAD51C, RAD51D, RB1, RECQL, RET, SDHA, SDHAF2, SDHB, SDHC, SDHD, SMAD4, SMARCA4, SMARCB1, SMARCE1, STK11, SUFU, TMEM127, TP53, TSC1, TSC2, VHL and XRCC2 (sequencing and deletion/duplication); EGFR, EGLN1, HOXB13, KIT, MITF, PDGFRA, POLD1, and POLE  (sequencing only); EPCAM and GREM1 (deletion/duplication only).    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  MEDICATIONS:  Current Outpatient Medications  Medication Sig Dispense Refill   acetaminophen (TYLENOL) 500 MG tablet Take 500-1,000 mg by mouth every 6 (six) hours as needed (pain.).     diazepam (VALIUM) 2 MG tablet Take 1 tablet (2 mg total) by mouth every 12 (twelve) hours as needed for up to 20 doses for muscle spasms. 20 tablet 0   nortriptyline (PAMELOR) 10 MG capsule Take 1 capsule (10 mg total) by mouth at bedtime. 30 capsule 5   ondansetron (ZOFRAN) 4 MG tablet Take 1 tablet (4 mg total) by mouth every 8 (eight) hours as needed for up to 20 doses for nausea or vomiting. 20 tablet 0   SUMAtriptan (IMITREX) 50 MG tablet TAKE 1 TAB BY MOUTH AS NEEDED FOR MIGRAINE. MAX 2 TABS IN 24 HOURS. MAY REPEAT IN 2 HOURS IF HEADACHE PERSISTS OR RECURS. 30 tablet 1   UBRELVY 100 MG TABS TAKE AS NEEDED (TAKE 1 AT THE EARLIST ONSET OF A MIGRAINE. MAY REPEAT IN 2 HOURS. MAX 2/24 HOURS). 30 tablet 1   Zavegepant HCl (ZAVZPRET) 10 MG/ACT SOLN Place 10 mg into the nose daily as needed. 6 each 11   No current facility-administered medications for this  visit.    PHYSICAL EXAMINATION: ECOG PERFORMANCE STATUS: {CHL ONC ECOG PS:[id number]}  There were no vitals filed for this visit. There were no vitals filed for this visit.  BREAST:*** No palpable masses or nodules in either right or left breasts. No palpable axillary supraclavicular or infraclavicular adenopathy no breast tenderness or nipple  discharge. (exam performed in the presence of a chaperone)  LABORATORY DATA:  I have reviewed the data as listed    Latest Ref Rng & Units 04/01/2022    1:35 PM 03/25/2022   11:39 AM 03/18/2022   10:38 AM  CMP  Glucose 70 - 99 mg/dL 78  93  85   BUN 6 - 20 mg/dL '10  9  11   '$ Creatinine 0.44 - 1.00 mg/dL 0.41  0.51  0.47   Sodium 135 - 145 mmol/L 139  138  140   Potassium 3.5 - 5.1 mmol/L 3.9  4.2  3.8   Chloride 98 - 111 mmol/L 106  103  105   CO2 22 - 32 mmol/L '28  29  29   '$ Calcium 8.9 - 10.3 mg/dL 9.0  9.3  9.4   Total Protein 6.5 - 8.1 g/dL 6.3  6.9  6.6   Total Bilirubin 0.3 - 1.2 mg/dL 0.4  0.3  0.4   Alkaline Phos 38 - 126 U/L 60  67  62   AST 15 - 41 U/L '18  25  19   '$ ALT 0 - 44 U/L '12  21  13     '$ Lab Results  Component Value Date   WBC 3.0 (L) 04/01/2022   HGB 10.5 (L) 04/01/2022   HCT 30.8 (L) 04/01/2022   MCV 96.9 04/01/2022   PLT 216 04/01/2022   NEUTROABS 1.4 (L) 04/01/2022    ASSESSMENT & PLAN:  No problem-specific Assessment & Plan notes found for this encounter.    No orders of the defined types were placed in this encounter.  The patient has a good understanding of the overall plan. she agrees with it. she will call with any problems that may develop before the next visit here. Total time spent: 30 mins including face to face time and time spent for planning, charting and co-ordination of care   Suzzette Righter, [place] 04/06/22    I Gardiner Coins am acting as a Education administrator for [employer]  ***

## 2022-04-07 ENCOUNTER — Ambulatory Visit (INDEPENDENT_AMBULATORY_CARE_PROVIDER_SITE_OTHER): Payer: Self-pay | Admitting: Physician Assistant

## 2022-04-07 DIAGNOSIS — Z9889 Other specified postprocedural states: Secondary | ICD-10-CM

## 2022-04-07 MED FILL — Dexamethasone Sodium Phosphate Inj 100 MG/10ML: INTRAMUSCULAR | Qty: 1 | Status: AC

## 2022-04-08 ENCOUNTER — Inpatient Hospital Stay: Payer: Commercial Managed Care - HMO | Attending: Hematology and Oncology

## 2022-04-08 ENCOUNTER — Inpatient Hospital Stay: Payer: Commercial Managed Care - HMO

## 2022-04-08 ENCOUNTER — Inpatient Hospital Stay (HOSPITAL_BASED_OUTPATIENT_CLINIC_OR_DEPARTMENT_OTHER): Payer: Commercial Managed Care - HMO | Admitting: Hematology and Oncology

## 2022-04-08 ENCOUNTER — Other Ambulatory Visit: Payer: Self-pay

## 2022-04-08 VITALS — BP 122/84 | HR 82 | Temp 97.3°F | Resp 18 | Ht 62.0 in | Wt 117.2 lb

## 2022-04-08 DIAGNOSIS — C50512 Malignant neoplasm of lower-outer quadrant of left female breast: Secondary | ICD-10-CM | POA: Diagnosis not present

## 2022-04-08 DIAGNOSIS — Z171 Estrogen receptor negative status [ER-]: Secondary | ICD-10-CM | POA: Insufficient documentation

## 2022-04-08 DIAGNOSIS — Z95828 Presence of other vascular implants and grafts: Secondary | ICD-10-CM

## 2022-04-08 DIAGNOSIS — R5383 Other fatigue: Secondary | ICD-10-CM | POA: Insufficient documentation

## 2022-04-08 DIAGNOSIS — Z5111 Encounter for antineoplastic chemotherapy: Secondary | ICD-10-CM | POA: Insufficient documentation

## 2022-04-08 DIAGNOSIS — D709 Neutropenia, unspecified: Secondary | ICD-10-CM | POA: Insufficient documentation

## 2022-04-08 DIAGNOSIS — D6481 Anemia due to antineoplastic chemotherapy: Secondary | ICD-10-CM | POA: Diagnosis not present

## 2022-04-08 DIAGNOSIS — Z79899 Other long term (current) drug therapy: Secondary | ICD-10-CM | POA: Diagnosis not present

## 2022-04-08 DIAGNOSIS — R110 Nausea: Secondary | ICD-10-CM | POA: Insufficient documentation

## 2022-04-08 LAB — CMP (CANCER CENTER ONLY)
ALT: 11 U/L (ref 0–44)
AST: 16 U/L (ref 15–41)
Albumin: 4 g/dL (ref 3.5–5.0)
Alkaline Phosphatase: 60 U/L (ref 38–126)
Anion gap: 5 (ref 5–15)
BUN: 9 mg/dL (ref 6–20)
CO2: 28 mmol/L (ref 22–32)
Calcium: 9.1 mg/dL (ref 8.9–10.3)
Chloride: 106 mmol/L (ref 98–111)
Creatinine: 0.55 mg/dL (ref 0.44–1.00)
GFR, Estimated: >60 mL/min (ref >60)
Glucose, Bld: 90 mg/dL (ref 70–99)
Potassium: 4 mmol/L (ref 3.5–5.1)
Sodium: 139 mmol/L (ref 135–145)
Total Bilirubin: 0.2 mg/dL — ABNORMAL LOW (ref 0.3–1.2)
Total Protein: 6.5 g/dL (ref 6.5–8.1)

## 2022-04-08 LAB — CBC WITH DIFFERENTIAL (CANCER CENTER ONLY)
Abs Immature Granulocytes: 0.01 10*3/uL (ref 0.00–0.07)
Basophils Absolute: 0 10*3/uL (ref 0.0–0.1)
Basophils Relative: 2 %
Eosinophils Absolute: 0.1 10*3/uL (ref 0.0–0.5)
Eosinophils Relative: 3 %
HCT: 30 % — ABNORMAL LOW (ref 36.0–46.0)
Hemoglobin: 10.4 g/dL — ABNORMAL LOW (ref 12.0–15.0)
Immature Granulocytes: 0 %
Lymphocytes Relative: 49 %
Lymphs Abs: 1.2 10*3/uL (ref 0.7–4.0)
MCH: 33.7 pg (ref 26.0–34.0)
MCHC: 34.7 g/dL (ref 30.0–36.0)
MCV: 97.1 fL (ref 80.0–100.0)
Monocytes Absolute: 0.2 10*3/uL (ref 0.1–1.0)
Monocytes Relative: 8 %
Neutro Abs: 0.9 10*3/uL — ABNORMAL LOW (ref 1.7–7.7)
Neutrophils Relative %: 38 %
Platelet Count: 245 10*3/uL (ref 150–400)
RBC: 3.09 MIL/uL — ABNORMAL LOW (ref 3.87–5.11)
RDW: 13.7 % (ref 11.5–15.5)
Smear Review: NORMAL
WBC Count: 2.4 10*3/uL — ABNORMAL LOW (ref 4.0–10.5)
nRBC: 0 % (ref 0.0–0.2)

## 2022-04-08 MED ORDER — SODIUM CHLORIDE 0.9% FLUSH
10.0000 mL | Freq: Once | INTRAVENOUS | Status: AC
  Administered 2022-04-08: 10 mL

## 2022-04-08 MED ORDER — HEPARIN SOD (PORK) LOCK FLUSH 100 UNIT/ML IV SOLN
500.0000 [IU] | Freq: Once | INTRAVENOUS | Status: AC
  Administered 2022-04-08: 500 [IU]

## 2022-04-08 NOTE — Progress Notes (Signed)
Pt. ANC 0.9 K/uL per Dr. Lindi Adie, no treatment today. Pt. educated and states she understands.

## 2022-04-08 NOTE — Assessment & Plan Note (Addendum)
09/07/2021:Screening mammogram detected left breast asymmetry posteriorly.  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, margins negative, 0/5 sentinel lymph nodes negative, ER 0%, PR 0%, HER2 negative, Ki-67 70%   Treatment plan: Adjuvant chemotherapy with Adriamycin and Cytoxan followed by Taxol started 12/03/2021 No role of adjuvant radiation or antiestrogen therapy --------------------------------------------------------------------------------------------------------------------------------------------------------------------- Current treatment: Completed 4 cycles of dose dense Adriamycin and Cytoxan, today cycle 9 Taxol Echocardiogram 09/22/2021: EF 60 to 65%   Chemotoxicities: Nausea: Severe for 2 to 3 days after chemo  fatigue Chemotherapy-induced anemia: Today's hemoglobin is 11.1 Pain during the last 15 minutes of chemotherapy Body aches and pains at home: Improved with Claritin   She will need a oophorectomy once chemotherapy is completed.   Return to clinic in weekly for Taxol treatments.

## 2022-04-08 NOTE — Patient Instructions (Addendum)
Neutropenia Neutropenia is a condition that occurs when you have low levels of neutrophils. Neutrophils are a type of white blood cells. They are made in the spongy center of bones (bone marrow). They fight infections. Neutrophils are your body's main defense against infections. The fewer neutrophils you have and the longer your body remains without them, the greater your risk of getting a severe infection. What are the causes? This condition can occur if your body uses up or destroys neutrophils faster than your bone marrow can make them. Neutropenia may be caused by: A bacterial or fungal infection. Allergic disorders. Reactions to some medicines. An autoimmune disease. An enlarged spleen. This condition can also occur if your bone marrow does not produce enough neutrophils. This problem may be caused by: Cancer. Cancer treatments, such as radiation or chemotherapy. Viral infections. Medicines, such as phenytoin. Vitamin B12 deficiency. Diseases of the bone marrow. Environmental toxins, such as insecticides. What are the signs or symptoms? This condition does not usually cause symptoms. If symptoms are present, they are usually caused by an underlying infection. Symptoms of an infection may include: Fever. Chills. Swollen glands. Mouth ulcers. Cough. Rash or skin infection. Skin may be red, swollen, or painful. Abdominal or rectal pain. Frequent urination or pain or burning with urination. Because neutropenia weakens the immune system, symptoms of infection may be reduced. It is important to be aware of any changes in your body and talk to your health care provider. How is this diagnosed? This condition is diagnosed based on your medical history and a physical exam. Tests will also be done, such as: A complete blood count (CBC). Bone marrow biopsy. This is collecting a sample of bone marrow for testing. A chest X-ray. A urine culture. A blood culture. How is this  treated? Treatment depends on the underlying cause and severity of your condition. Mild neutropenia may not require treatment. Treatment may include medicines, such as: Antibiotic medicine given through an IV. Antiviral medicines. Antifungal medicines. A medicine to increase production of neutrophils (colony-stimulating factor). You may get this medicine through an IV or by injection. Steroids given through an IV. If an underlying condition is causing neutropenia, you may need treatment for that condition. If medicines or cancer treatments are causing neutropenia, your health care provider may have you stop the medicines or treatment. Follow these instructions at home: Medicines  Take over-the-counter and prescription medicines only as told by your health care provider. Get an annual flu shot. Ask your health care provider whether you or anyone you live with needs any other vaccines. Eating and drinking Do not share food utensils. Do not eat unpasteurized foods. Do not eat raw or undercooked meat, eggs, or seafood. Do not eat unwashed, raw fruits or vegetables. Lifestyle Avoid exposure to groups of people or children. Avoid being around people who are sick. Avoid being around live plants or fresh flowers. Avoid being around dirt or dust, such as in construction areas or gardens. Wear gloves if you are going to do yard work or gardening. Do not provide direct care for pets. Avoid animal droppings. Do not clean litter boxes and bird cages. Do not have sex unless your health care provider has approved. Hygiene  Bathe daily. Clean the area between the genitals and the anus (perineal area) after you urinate or have a bowel movement. If you are female, wipe from front to back. Get regular dental care and brush your teeth with a soft toothbrush before and after meals. Do not use   a regular razor. Use an electric razor to remove hair. Wash your hands often with soap and water for at least 20  seconds. Make sure others who come in contact with you also wash their hands. If soap and water are not available, use hand sanitizer. General instructions Take steps to reduce your risk of injury or infection. Follow any precautions as told by your health care provider. Take actions to avoid cuts and burns. For example: Be cautious when you use knives. Always cut away from yourself. Keep knives in protective sheaths or guards when not in use. Use oven mitts when you cook with a hot stove, oven, or grill. Stand a safe distance away from open fires. Do not use tampons, enemas, or rectal suppositories unless your health care provider has approved. Keep all follow-up visits. This is important. Contact a health care provider if: You have a cough. You have a sore throat. You develop sores in your mouth or anus. You have a warm, red, or tender area on your skin. You have red streaks on the skin. You develop a rash. You have swollen lymph nodes. You have frequent or painful urination. You have vaginal discharge or itching. Get help right away if: You have a fever. You have chills or shaking. You have nausea or vomiting. You have a lot of fatigue. You have shortness of breath. Summary Neutropenia is a condition that occurs when you have a lower-than-normal level of a type of white blood cell (neutrophils) in your body. This condition can occur if your body uses up or destroys neutrophils faster than your bone marrow can make them. Treatment depends on the underlying cause and severity of your condition. Mild neutropenia may not require treatment. Follow any precautions as told by your health care provider to reduce your risk for injury or infection. This information is not intended to replace advice given to you by your health care provider. Make sure you discuss any questions you have with your health care provider. Document Revised: 07/16/2020 Document Reviewed: 07/16/2020 Elsevier Patient  Education  2023 [employer]vier Inc[employer]  

## 2022-04-09 NOTE — Progress Notes (Signed)
Patient Care Team: Pcp, No as PCP - General [name], RN as Oncology Nurse Navigator Mauro Kaufmann, RN as Oncology Nurse Navigator [name], MD as Consulting Physician (General Surgery) Nicholas Lose, MD as Consulting Physician (Hematology and Oncology) Kyung Rudd, MD as Consulting Physician (Radiation Oncology)  DIAGNOSIS: No diagnosis found.  SUMMARY OF ONCOLOGIC HISTORY: Oncology History  Malignant neoplasm of lower-outer quadrant of left breast of female, estrogen receptor negative ([place])  09/07/2021 Initial Diagnosis   Screening mammogram detected left breast asymmetry posteriorly.  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  Report date is September 26, 2021 ([place]) and September 29, 2021 (expanded).    The CancerNext-Expanded gene panel offered by [facility] and includes sequencing, rearrangement, and RNA analysis for the following 77 genes: AIP, ALK, APC, ATM, AXIN2, BAP1, BARD1, BLM, BMPR1A, BRCA1, BRCA2, BRIP1, CDC73, CDH1, CDK4, CDKN1B, CDKN2A, CHEK2, CTNNA1, DICER1, FANCC, FH, FLCN, GALNT12, KIF1B, LZTR1, MAX, MEN1, MET, MLH1, MSH2, MSH3, MSH6, MUTYH, NBN, NF1, NF2, NTHL1, PALB2, PHOX2B, PMS2, POT1, PRKAR1A, PTCH1, PTEN, RAD51C, RAD51D, RB1, RECQL, RET, SDHA, SDHAF2, SDHB, SDHC, SDHD, SMAD4, SMARCA4, SMARCB1, SMARCE1, STK11, SUFU, TMEM127, TP53, TSC1, TSC2, VHL and XRCC2 (sequencing and deletion/duplication); EGFR, EGLN1, HOXB13, KIT, MITF, PDGFRA, POLD1, and POLE  (sequencing only); EPCAM and GREM1 (deletion/duplication only).    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 female with the above-mentioned left breast cancer. She presents to the clinic for a follow-up.    ALLERGIES:  has No Known Allergies.  MEDICATIONS:  Current Outpatient Medications  Medication Sig Dispense Refill   acetaminophen (TYLENOL) 500 MG tablet Take 500-1,000 mg by mouth every 6 (six) hours as needed (pain.).     diazepam (VALIUM) 2 MG tablet Take 1 tablet (2 mg total) by mouth every 12 (twelve) hours as needed for up to 20 doses for muscle spasms. 20 tablet 0   nortriptyline (PAMELOR) 10 MG capsule Take 1 capsule (10 mg total) by mouth at bedtime. 30 capsule 5   ondansetron (ZOFRAN) 4 MG tablet Take 1 tablet (4 mg total) by mouth every 8 (eight) hours as needed for up to 20 doses for nausea or vomiting. 20 tablet 0   SUMAtriptan (IMITREX) 50 MG tablet TAKE 1 TAB BY MOUTH AS NEEDED FOR MIGRAINE. MAX 2 TABS IN 24 HOURS. MAY REPEAT IN 2 HOURS IF HEADACHE PERSISTS OR RECURS. 30 tablet 1   UBRELVY 100 MG TABS TAKE AS NEEDED (TAKE 1 AT THE EARLIST ONSET OF A MIGRAINE. MAY REPEAT IN 2 HOURS. MAX 2/24 HOURS). 30 tablet 1   Zavegepant HCl (ZAVZPRET) 10 MG/ACT  SOLN Place 10 mg into the nose daily as needed. 6 each 11   No current facility-administered medications for this visit.    PHYSICAL EXAMINATION: ECOG PERFORMANCE STATUS: {CHL ONC ECOG PS:[id number]}  There were no vitals filed for this visit. There were no vitals filed for this visit.  BREAST:*** No palpable masses or nodules in  either right or left breasts. No palpable axillary supraclavicular or infraclavicular adenopathy no breast tenderness or nipple discharge. (exam performed in the presence of a chaperone)  LABORATORY DATA:  I have reviewed the data as listed    Latest Ref Rng & Units 04/08/2022   11:22 AM 04/01/2022    1:35 PM 03/25/2022   11:39 AM  CMP  Glucose 70 - 99 mg/dL 90  78  93   BUN 6 - 20 mg/dL '9  10  9   '$ Creatinine 0.44 - 1.00 mg/dL 0.55  0.41  0.51   Sodium 135 - 145 mmol/L 139  139  138   Potassium 3.5 - 5.1 mmol/L 4.0  3.9  4.2   Chloride 98 - 111 mmol/L 106  106  103   CO2 22 - 32 mmol/L '28  28  29   '$ Calcium 8.9 - 10.3 mg/dL 9.1  9.0  9.3   Total Protein 6.5 - 8.1 g/dL 6.5  6.3  6.9   Total Bilirubin 0.3 - 1.2 mg/dL 0.2  0.4  0.3   Alkaline Phos 38 - 126 U/L 60  60  67   AST 15 - 41 U/L '16  18  25   '$ ALT 0 - 44 U/L '11  12  21     '$ Lab Results  Component Value Date   WBC 2.4 (L) 04/08/2022   HGB 10.4 (L) 04/08/2022   HCT 30.0 (L) 04/08/2022   MCV 97.1 04/08/2022   PLT 245 04/08/2022   NEUTROABS 0.9 (L) 04/08/2022    ASSESSMENT & PLAN:  No problem-specific Assessment & Plan notes found for this encounter.    No orders of the defined types were placed in this encounter.  The patient has a good understanding of the overall plan. she agrees with it. she will call with any problems that may develop before the next visit here. Total time spent: 30 mins including face to face time and time spent for planning, charting and co-ordination of care   Suzzette Righter, [place] 04/09/22    I Gardiner Coins am acting as a Education administrator for [employer]  ***

## 2022-04-14 MED FILL — Dexamethasone Sodium Phosphate Inj 100 MG/10ML: INTRAMUSCULAR | Qty: 1 | Status: AC

## 2022-04-15 ENCOUNTER — Inpatient Hospital Stay (HOSPITAL_BASED_OUTPATIENT_CLINIC_OR_DEPARTMENT_OTHER): Payer: Commercial Managed Care - HMO | Admitting: Hematology and Oncology

## 2022-04-15 ENCOUNTER — Inpatient Hospital Stay: Payer: Commercial Managed Care - HMO

## 2022-04-15 ENCOUNTER — Other Ambulatory Visit: Payer: Self-pay

## 2022-04-15 VITALS — BP 115/78 | HR 80 | Resp 17

## 2022-04-15 VITALS — BP 128/83 | HR 77 | Temp 97.5°F | Resp 18 | Ht 62.0 in | Wt 116.0 lb

## 2022-04-15 DIAGNOSIS — Z171 Estrogen receptor negative status [ER-]: Secondary | ICD-10-CM

## 2022-04-15 DIAGNOSIS — C50512 Malignant neoplasm of lower-outer quadrant of left female breast: Secondary | ICD-10-CM

## 2022-04-15 DIAGNOSIS — Z5111 Encounter for antineoplastic chemotherapy: Secondary | ICD-10-CM | POA: Diagnosis not present

## 2022-04-15 DIAGNOSIS — Z95828 Presence of other vascular implants and grafts: Secondary | ICD-10-CM

## 2022-04-15 LAB — CMP (CANCER CENTER ONLY)
ALT: 12 U/L (ref 0–44)
AST: 19 U/L (ref 15–41)
Albumin: 4.3 g/dL (ref 3.5–5.0)
Alkaline Phosphatase: 69 U/L (ref 38–126)
Anion gap: 7 (ref 5–15)
BUN: 12 mg/dL (ref 6–20)
CO2: 27 mmol/L (ref 22–32)
Calcium: 9.6 mg/dL (ref 8.9–10.3)
Chloride: 105 mmol/L (ref 98–111)
Creatinine: 0.62 mg/dL (ref 0.44–1.00)
GFR, Estimated: >60 mL/min (ref >60)
Glucose, Bld: 87 mg/dL (ref 70–99)
Potassium: 3.7 mmol/L (ref 3.5–5.1)
Sodium: 139 mmol/L (ref 135–145)
Total Bilirubin: 0.4 mg/dL (ref 0.3–1.2)
Total Protein: 7.2 g/dL (ref 6.5–8.1)

## 2022-04-15 LAB — CBC WITH DIFFERENTIAL (CANCER CENTER ONLY)
Abs Immature Granulocytes: 0.01 10*3/uL (ref 0.00–0.07)
Basophils Absolute: 0 10*3/uL (ref 0.0–0.1)
Basophils Relative: 2 %
Eosinophils Absolute: 0.1 10*3/uL (ref 0.0–0.5)
Eosinophils Relative: 2 %
HCT: 32.3 % — ABNORMAL LOW (ref 36.0–46.0)
Hemoglobin: 11.2 g/dL — ABNORMAL LOW (ref 12.0–15.0)
Immature Granulocytes: 0 %
Lymphocytes Relative: 46 %
Lymphs Abs: 1.2 10*3/uL (ref 0.7–4.0)
MCH: 33.1 pg (ref 26.0–34.0)
MCHC: 34.7 g/dL (ref 30.0–36.0)
MCV: 95.6 fL (ref 80.0–100.0)
Monocytes Absolute: 0.3 10*3/uL (ref 0.1–1.0)
Monocytes Relative: 12 %
Neutro Abs: 1 10*3/uL — ABNORMAL LOW (ref 1.7–7.7)
Neutrophils Relative %: 38 %
Platelet Count: 267 10*3/uL (ref 150–400)
RBC: 3.38 MIL/uL — ABNORMAL LOW (ref 3.87–5.11)
RDW: 13.9 % (ref 11.5–15.5)
WBC Count: 2.6 10*3/uL — ABNORMAL LOW (ref 4.0–10.5)
nRBC: 0 % (ref 0.0–0.2)

## 2022-04-15 MED ORDER — PALONOSETRON HCL INJECTION 0.25 MG/5ML
0.2500 mg | Freq: Once | INTRAVENOUS | Status: AC
  Administered 2022-04-15: 0.25 mg via INTRAVENOUS
  Filled 2022-04-15: qty 5

## 2022-04-15 MED ORDER — SODIUM CHLORIDE 0.9 % IV SOLN
10.0000 mg | Freq: Once | INTRAVENOUS | Status: AC
  Administered 2022-04-15: 10 mg via INTRAVENOUS
  Filled 2022-04-15: qty 10

## 2022-04-15 MED ORDER — SODIUM CHLORIDE 0.9 % IV SOLN: Freq: Once | INTRAVENOUS | Status: AC

## 2022-04-15 MED ORDER — SODIUM CHLORIDE 0.9 % IV SOLN
65.0000 mg/m2 | Freq: Once | INTRAVENOUS | Status: AC
  Administered 2022-04-15: 96 mg via INTRAVENOUS
  Filled 2022-04-15: qty 16

## 2022-04-15 MED ORDER — DIPHENHYDRAMINE HCL 50 MG/ML IJ SOLN
25.0000 mg | Freq: Once | INTRAMUSCULAR | Status: AC
  Administered 2022-04-15: 25 mg via INTRAVENOUS
  Filled 2022-04-15: qty 1

## 2022-04-15 MED ORDER — SODIUM CHLORIDE 0.9% FLUSH
10.0000 mL | INTRAVENOUS | Status: DC | PRN
  Administered 2022-04-15: 10 mL

## 2022-04-15 MED ORDER — HEPARIN SOD (PORK) LOCK FLUSH 100 UNIT/ML IV SOLN
500.0000 [IU] | Freq: Once | INTRAVENOUS | Status: AC | PRN
  Administered 2022-04-15: 500 [IU]

## 2022-04-15 MED ORDER — FAMOTIDINE IN NACL 20-0.9 MG/50ML-% IV SOLN
20.0000 mg | Freq: Once | INTRAVENOUS | Status: AC
  Administered 2022-04-15: 20 mg via INTRAVENOUS
  Filled 2022-04-15: qty 50

## 2022-04-15 MED ORDER — SODIUM CHLORIDE 0.9% FLUSH
10.0000 mL | Freq: Once | INTRAVENOUS | Status: AC
  Administered 2022-04-15: 10 mL

## 2022-04-15 NOTE — Assessment & Plan Note (Signed)
09/07/2021:Screening mammogram detected left breast asymmetry posteriorly.  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, margins negative, 0/5 sentinel lymph nodes negative, ER 0%, PR 0%, HER2 negative, Ki-67 70%   Treatment plan: Adjuvant chemotherapy with Adriamycin and Cytoxan followed by Taxol started 12/03/2021 No role of adjuvant radiation or antiestrogen therapy --------------------------------------------------------------------------------------------------------------------------------------------------------------------- Current treatment: Completed 4 cycles of dose dense Adriamycin and Cytoxan, today cycle 9 Taxol (last week treatment was held for neutropenia) Echocardiogram 09/22/2021: EF 60 to 65%   Chemotoxicities: Nausea: Severe for 2 to 3 days after chemo  fatigue Chemotherapy-induced anemia: Today's hemoglobin is 11.1 Pain during the last 15 minutes of chemotherapy Body aches and pains at home: Improved with Claritin   She will need a oophorectomy once chemotherapy is completed.   Return to clinic in weekly for Taxol treatments.

## 2022-04-15 NOTE — Patient Instructions (Signed)
[facility]  Discharge Instructions: Thank you for choosing [facility] to provide your oncology and hematology care.   If you have a lab appointment with the Cancer Center, please go directly to the Cancer Center and check in at the registration area.   Wear comfortable clothing and clothing appropriate for easy access to any Portacath or PICC line.   We strive to give you quality time with your provider. You may need to reschedule your appointment if you arrive late (15 or more minutes).  Arriving late affects you and other patients whose appointments are after yours.  Also, if you miss three or more appointments without notifying the office, you may be dismissed from the clinic at the provider's discretion.      For prescription refill requests, have your pharmacy contact our office and allow 72 hours for refills to be completed.    Today you received the following chemotherapy and/or immunotherapy agents Paclitaxel      To help prevent nausea and vomiting after your treatment, we encourage you to take your nausea medication as directed.  BELOW ARE SYMPTOMS THAT SHOULD BE REPORTED IMMEDIATELY: *FEVER GREATER THAN 100.4 F (38 C) OR HIGHER *CHILLS OR SWEATING *NAUSEA AND VOMITING THAT IS NOT CONTROLLED WITH YOUR NAUSEA MEDICATION *UNUSUAL SHORTNESS OF BREATH *UNUSUAL BRUISING OR BLEEDING *URINARY PROBLEMS (pain or burning when urinating, or frequent urination) *BOWEL PROBLEMS (unusual diarrhea, constipation, pain near the anus) TENDERNESS IN MOUTH AND THROAT WITH OR WITHOUT PRESENCE OF ULCERS (sore throat, sores in mouth, or a toothache) UNUSUAL RASH, SWELLING OR PAIN  UNUSUAL VAGINAL DISCHARGE OR ITCHING   Items with * indicate a potential emergency and should be followed up as soon as possible or go to the Emergency Department if any problems should occur.  Please show the CHEMOTHERAPY ALERT CARD or IMMUNOTHERAPY ALERT CARD at  check-in to the Emergency Department and triage nurse.  Should you have questions after your visit or need to cancel or reschedule your appointment, please contact [facility]  Dept: [phone]  and follow the prompts.  Office hours are 8:00 a.m. to 4:30 p.m. Monday - Friday. Please note that voicemails left after 4:00 p.m. may not be returned until the following business day.  We are closed weekends and major holidays. You have access to a nurse at all times for urgent questions. Please call the main number to the clinic Dept: [phone] and follow the prompts.   For any non-urgent questions, you may also contact your provider using MyChart. We now offer e-Visits for anyone 18 and older to request care online for non-urgent symptoms. For details visit [email or web address].   Also download the MyChart app! Go to the app store, search "MyChart", open the app, select , and log in with your MyChart username and password.   

## 2022-04-16 ENCOUNTER — Other Ambulatory Visit: Payer: Self-pay

## 2022-04-16 ENCOUNTER — Telehealth: Payer: Self-pay | Admitting: Hematology and Oncology

## 2022-04-16 NOTE — Telephone Encounter (Signed)
Scheduled appointments per WQ. Patient is aware of all made appointments. 

## 2022-04-19 NOTE — Progress Notes (Unsigned)
Patient is a pleasant 57 year old female with PMH of left-sided breast cancer s/p bilateral mastectomy with immediate reconstruction using tissue expander and Flex HD performed 10/29/2021 and subsequent right-sided infection and expander removal 12/17/2021 by Dr. Marla Roe.  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  Reviewed operative report and Cellerate was placed at medial aspect of mastectomy incision where it was not fully closed primarily.   She was last seen here in clinic on 04/07/2022.  At that time, she was feeling improved with use of calcium alginate dressings.  On exam, wound was less than 0.5 cm and the surrounding erythema measured 2 x 2 5 cm.  Remainder of reconstruction sites were without any concern.  Plan was for continued dressing changes and follow-up in 2 weeks.  Today,

## 2022-04-20 ENCOUNTER — Other Ambulatory Visit: Payer: Self-pay

## 2022-04-20 ENCOUNTER — Inpatient Hospital Stay: Payer: Commercial Managed Care - HMO

## 2022-04-20 VITALS — BP 118/83 | HR 79 | Temp 98.3°F | Resp 16

## 2022-04-20 DIAGNOSIS — Z5111 Encounter for antineoplastic chemotherapy: Secondary | ICD-10-CM | POA: Diagnosis not present

## 2022-04-20 DIAGNOSIS — C50512 Malignant neoplasm of lower-outer quadrant of left female breast: Secondary | ICD-10-CM

## 2022-04-20 MED ORDER — FILGRASTIM-AAFI 300 MCG/0.5ML IJ SOSY
300.0000 ug | PREFILLED_SYRINGE | Freq: Once | INTRAMUSCULAR | Status: AC
  Administered 2022-04-20: 300 ug via SUBCUTANEOUS
  Filled 2022-04-20: qty 0.5

## 2022-04-20 NOTE — Patient Instructions (Signed)
Filgrastim Injection What is this medication? FILGRASTIM (fil GRA stim) lowers the risk of infection in people who are receiving chemotherapy. It works by helping your body make more white blood cells, which protects your body from infection. It may also be used to help people who have been exposed to high doses of radiation. It can be used to help prepare your body before a stem cell transplant. It works by helping your bone marrow make and release stem cells into the blood. This medicine may be used for other purposes; ask your health care provider or pharmacist if you have questions. COMMON BRAND NAME(S): Neupogen, Nivestym, Releuko, Zarxio What should I tell my care team before I take this medication? They need to know if you have any of these conditions: History of blood diseases, such as sickle cell anemia Kidney disease Recent or ongoing radiation An unusual or allergic reaction to filgrastim, pegfilgrastim, latex, rubber, other medications, foods, dyes, or preservatives Pregnant or trying to get pregnant Breast-feeding How should I use this medication? This medication is injected under the skin or into a vein. It is usually given by your care team in a hospital or clinic setting. It may be given at home. If you get this medication at home, you will be taught how to prepare and give it. Use exactly as directed. Take it as directed on the prescription label at the same time every day. Keep taking it unless your care team tells you to stop. It is important that you put your used needles and syringes in a special sharps container. Do not put them in a trash can. If you do not have a sharps container, call your pharmacist or care team to get one. This medication comes with INSTRUCTIONS FOR USE. Ask your pharmacist for directions on how to use this medication. Read the information carefully. Talk to your pharmacist or care team if you have questions. Talk to your care team about the use of this  medication in children. While it may be prescribed for children for selected conditions, precautions do apply. Overdosage: If you think you have taken too much of this medicine contact a poison control center or emergency room at once. NOTE: This medicine is only for you. Do not share this medicine with others. What if I miss a dose? It is important not to miss any doses. Talk to your care team about what to do if you miss a dose. What may interact with this medication? Medications that may cause a release of neutrophils, such as lithium This list may not describe all possible interactions. Give your health care provider a list of all the medicines, herbs, non-prescription drugs, or dietary supplements you use. Also tell them if you smoke, drink alcohol, or use illegal drugs. Some items may interact with your medicine. What should I watch for while using this medication? Your condition will be monitored carefully while you are receiving this medication. You may need bloodwork while taking this medication. Talk to your care team about your risk of cancer. You may be more at risk for certain types of cancer if you take this medication. What side effects may I notice from receiving this medication? Side effects that you should report to your care team as soon as possible: Allergic reactions--skin rash, itching, hives, swelling of the face, lips, tongue, or throat Capillary leak syndrome--stomach or muscle pain, unusual weakness or fatigue, feeling faint or lightheaded, decrease in the amount of urine, swelling of the ankles, hands, or   feet, trouble breathing High white blood cell level--fever, fatigue, trouble breathing, night sweats, change in vision, weight loss Inflammation of the aorta--fever, fatigue, back, chest, or stomach pain, severe headache Kidney injury (glomerulonephritis)--decrease in the amount of urine, red or dark brown urine, foamy or bubbly urine, swelling of the ankles, hands, or  feet Shortness of breath or trouble breathing Spleen injury--pain in upper left stomach or shoulder Unusual bruising or bleeding Side effects that usually do not require medical attention (report to your care team if they continue or are bothersome): Back pain Bone pain Fatigue Fever Headache Nausea This list may not describe all possible side effects. Call your doctor for medical advice about side effects. You may report side effects to FDA at 1-800-FDA-1088. Where should I keep my medication? Keep out of the reach of children and pets. Keep this medication in the original packaging until you are ready to take it. Protect from light. See product for storage information. Each product may have different instructions. Get rid of any unused medication after the expiration date. To get rid of medications that are no longer needed or have expired: Take the medication to a medications take-back program. Check with your pharmacy or law enforcement to find a location. If you cannot return the medication, ask your pharmacist or care team how to get rid of this medication safely. NOTE: This sheet is a summary. It may not cover all possible information. If you have questions about this medicine, talk to your doctor, pharmacist, or health care provider.  2023 Elsevier/Gold Standard (2021-04-28 00:00:00)  

## 2022-04-21 ENCOUNTER — Ambulatory Visit (INDEPENDENT_AMBULATORY_CARE_PROVIDER_SITE_OTHER): Payer: Commercial Managed Care - HMO | Admitting: Physician Assistant

## 2022-04-21 ENCOUNTER — Encounter: Payer: Self-pay | Admitting: Physician Assistant

## 2022-04-21 VITALS — BP 120/78 | HR 94

## 2022-04-21 DIAGNOSIS — Z9889 Other specified postprocedural states: Secondary | ICD-10-CM

## 2022-04-21 MED FILL — Dexamethasone Sodium Phosphate Inj 100 MG/10ML: INTRAMUSCULAR | Qty: 1 | Status: AC

## 2022-04-22 ENCOUNTER — Inpatient Hospital Stay: Payer: Commercial Managed Care - HMO

## 2022-04-22 ENCOUNTER — Other Ambulatory Visit: Payer: Self-pay

## 2022-04-22 ENCOUNTER — Inpatient Hospital Stay (HOSPITAL_BASED_OUTPATIENT_CLINIC_OR_DEPARTMENT_OTHER): Payer: Commercial Managed Care - HMO | Admitting: Hematology and Oncology

## 2022-04-22 VITALS — BP 134/83 | HR 82 | Temp 97.3°F | Resp 18 | Ht 62.0 in | Wt 115.1 lb

## 2022-04-22 VITALS — BP 128/91 | HR 81 | Temp 98.1°F

## 2022-04-22 DIAGNOSIS — C50512 Malignant neoplasm of lower-outer quadrant of left female breast: Secondary | ICD-10-CM

## 2022-04-22 DIAGNOSIS — Z95828 Presence of other vascular implants and grafts: Secondary | ICD-10-CM

## 2022-04-22 DIAGNOSIS — Z171 Estrogen receptor negative status [ER-]: Secondary | ICD-10-CM | POA: Diagnosis not present

## 2022-04-22 DIAGNOSIS — Z5111 Encounter for antineoplastic chemotherapy: Secondary | ICD-10-CM | POA: Diagnosis not present

## 2022-04-22 LAB — CMP (CANCER CENTER ONLY)
ALT: 12 U/L (ref 0–44)
AST: 16 U/L (ref 15–41)
Albumin: 4.3 g/dL (ref 3.5–5.0)
Alkaline Phosphatase: 83 U/L (ref 38–126)
Anion gap: 6 (ref 5–15)
BUN: 10 mg/dL (ref 6–20)
CO2: 30 mmol/L (ref 22–32)
Calcium: 9.6 mg/dL (ref 8.9–10.3)
Chloride: 104 mmol/L (ref 98–111)
Creatinine: 0.54 mg/dL (ref 0.44–1.00)
GFR, Estimated: >60 mL/min (ref >60)
Glucose, Bld: 91 mg/dL (ref 70–99)
Potassium: 3.7 mmol/L (ref 3.5–5.1)
Sodium: 140 mmol/L (ref 135–145)
Total Bilirubin: 0.3 mg/dL (ref 0.3–1.2)
Total Protein: 6.8 g/dL (ref 6.5–8.1)

## 2022-04-22 LAB — CBC WITH DIFFERENTIAL (CANCER CENTER ONLY)
Abs Immature Granulocytes: 0.11 10*3/uL — ABNORMAL HIGH (ref 0.00–0.07)
Basophils Absolute: 0.1 10*3/uL (ref 0.0–0.1)
Basophils Relative: 1 %
Eosinophils Absolute: 0.1 10*3/uL (ref 0.0–0.5)
Eosinophils Relative: 1 %
HCT: 33.9 % — ABNORMAL LOW (ref 36.0–46.0)
Hemoglobin: 11.6 g/dL — ABNORMAL LOW (ref 12.0–15.0)
Immature Granulocytes: 1 %
Lymphocytes Relative: 16 %
Lymphs Abs: 1.9 10*3/uL (ref 0.7–4.0)
MCH: 32.9 pg (ref 26.0–34.0)
MCHC: 34.2 g/dL (ref 30.0–36.0)
MCV: 96 fL (ref 80.0–100.0)
Monocytes Absolute: 0.5 10*3/uL (ref 0.1–1.0)
Monocytes Relative: 4 %
Neutro Abs: 8.9 10*3/uL — ABNORMAL HIGH (ref 1.7–7.7)
Neutrophils Relative %: 77 %
Platelet Count: 283 10*3/uL (ref 150–400)
RBC: 3.53 MIL/uL — ABNORMAL LOW (ref 3.87–5.11)
RDW: 14.2 % (ref 11.5–15.5)
WBC Count: 11.5 10*3/uL — ABNORMAL HIGH (ref 4.0–10.5)
nRBC: 0 % (ref 0.0–0.2)

## 2022-04-22 MED ORDER — SODIUM CHLORIDE 0.9% FLUSH
10.0000 mL | INTRAVENOUS | Status: DC | PRN
  Administered 2022-04-22: 10 mL

## 2022-04-22 MED ORDER — FAMOTIDINE IN NACL 20-0.9 MG/50ML-% IV SOLN
20.0000 mg | Freq: Once | INTRAVENOUS | Status: AC
  Administered 2022-04-22: 20 mg via INTRAVENOUS
  Filled 2022-04-22: qty 50

## 2022-04-22 MED ORDER — HEPARIN SOD (PORK) LOCK FLUSH 100 UNIT/ML IV SOLN
500.0000 [IU] | Freq: Once | INTRAVENOUS | Status: AC | PRN
  Administered 2022-04-22: 500 [IU]

## 2022-04-22 MED ORDER — DIPHENHYDRAMINE HCL 50 MG/ML IJ SOLN
25.0000 mg | Freq: Once | INTRAMUSCULAR | Status: AC
  Administered 2022-04-22: 25 mg via INTRAVENOUS
  Filled 2022-04-22: qty 1

## 2022-04-22 MED ORDER — SODIUM CHLORIDE 0.9 % IV SOLN: Freq: Once | INTRAVENOUS | Status: AC

## 2022-04-22 MED ORDER — SODIUM CHLORIDE 0.9 % IV SOLN
10.0000 mg | Freq: Once | INTRAVENOUS | Status: AC
  Administered 2022-04-22: 10 mg via INTRAVENOUS
  Filled 2022-04-22: qty 10
  Filled 2022-04-22: qty 1

## 2022-04-22 MED ORDER — PALONOSETRON HCL INJECTION 0.25 MG/5ML
0.2500 mg | Freq: Once | INTRAVENOUS | Status: AC
  Administered 2022-04-22: 0.25 mg via INTRAVENOUS
  Filled 2022-04-22: qty 5

## 2022-04-22 MED ORDER — SODIUM CHLORIDE 0.9 % IV SOLN
65.0000 mg/m2 | Freq: Once | INTRAVENOUS | Status: AC
  Administered 2022-04-22: 96 mg via INTRAVENOUS
  Filled 2022-04-22: qty 16

## 2022-04-22 MED ORDER — SODIUM CHLORIDE 0.9% FLUSH
10.0000 mL | Freq: Once | INTRAVENOUS | Status: AC
  Administered 2022-04-22: 10 mL

## 2022-04-22 NOTE — Patient Instructions (Signed)
[facility]  Discharge Instructions: Thank you for choosing [facility] to provide your oncology and hematology care.   If you have a lab appointment with the Cancer Center, please go directly to the Cancer Center and check in at the registration area.   Wear comfortable clothing and clothing appropriate for easy access to any Portacath or PICC line.   We strive to give you quality time with your provider. You may need to reschedule your appointment if you arrive late (15 or more minutes).  Arriving late affects you and other patients whose appointments are after yours.  Also, if you miss three or more appointments without notifying the office, you may be dismissed from the clinic at the provider's discretion.      For prescription refill requests, have your pharmacy contact our office and allow 72 hours for refills to be completed.    Today you received the following chemotherapy and/or immunotherapy agents Paclitaxel      To help prevent nausea and vomiting after your treatment, we encourage you to take your nausea medication as directed.  BELOW ARE SYMPTOMS THAT SHOULD BE REPORTED IMMEDIATELY: *FEVER GREATER THAN 100.4 F (38 C) OR HIGHER *CHILLS OR SWEATING *NAUSEA AND VOMITING THAT IS NOT CONTROLLED WITH YOUR NAUSEA MEDICATION *UNUSUAL SHORTNESS OF BREATH *UNUSUAL BRUISING OR BLEEDING *URINARY PROBLEMS (pain or burning when urinating, or frequent urination) *BOWEL PROBLEMS (unusual diarrhea, constipation, pain near the anus) TENDERNESS IN MOUTH AND THROAT WITH OR WITHOUT PRESENCE OF ULCERS (sore throat, sores in mouth, or a toothache) UNUSUAL RASH, SWELLING OR PAIN  UNUSUAL VAGINAL DISCHARGE OR ITCHING   Items with * indicate a potential emergency and should be followed up as soon as possible or go to the Emergency Department if any problems should occur.  Please show the CHEMOTHERAPY ALERT CARD or IMMUNOTHERAPY ALERT CARD at  check-in to the Emergency Department and triage nurse.  Should you have questions after your visit or need to cancel or reschedule your appointment, please contact [place] [place] [facility]  Dept: [phone]  and follow the prompts.  Office hours are 8:00 a.m. to 4:30 p.m. Monday - Friday. Please note that voicemails left after 4:00 p.m. may not be returned until the following business day.  We are closed weekends and major holidays. You have access to a nurse at all times for urgent questions. Please call the main number to the clinic Dept: [phone] and follow the prompts.   For any non-urgent questions, you may also contact your provider using MyChart. We now offer e-Visits for anyone 18 and older to request care online for non-urgent symptoms. For details visit [email or web address].   Also download the MyChart app! Go to the app store, search "MyChart", open the app, select [place], and log in with your MyChart username and password.   

## 2022-04-22 NOTE — Assessment & Plan Note (Signed)
09/07/2021:Screening mammogram detected left breast asymmetry posteriorly.  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, margins negative, 0/5 sentinel lymph nodes negative, ER 0%, PR 0%, HER2 negative, Ki-67 70%   Treatment plan: Adjuvant chemotherapy with Adriamycin and Cytoxan followed by Taxol started 12/03/2021 No role of adjuvant radiation or antiestrogen therapy --------------------------------------------------------------------------------------------------------------------------------------------------------------------- Current treatment: Completed 4 cycles of dose dense Adriamycin and Cytoxan, today cycle 6 Taxol   Chemotoxicities: Nausea: Severe for 2 to 3 days after chemo  fatigue Chemotherapy-induced anemia: Today's hemoglobin is 11.1 Pain during the last 15 minutes of chemotherapy Body aches and pains at home: Improved with Claritin Neutropenia: ANC 1.0, reduce the dosage of Taxol to 65 mg/m.  Will also add a Granix injection 2 days before chemotherapy for the next cycle.  Okay to treat with an [place] of 1 today.   She will need a oophorectomy once chemotherapy is completed.   Return to clinic in weekly for Taxol treatments.

## 2022-04-22 NOTE — Progress Notes (Signed)
Patient Care Team: Pcp, No as PCP - General [name], RN as Oncology Nurse Navigator Mauro Kaufmann, RN as Oncology Nurse Navigator [name], MD as Consulting Physician (General Surgery) Nicholas Lose, MD as Consulting Physician (Hematology and Oncology) Kyung Rudd, MD as Consulting Physician (Radiation Oncology)  DIAGNOSIS:  Encounter Diagnosis  Name Primary?   Malignant neoplasm of lower-outer quadrant of left breast of female, estrogen receptor negative ([place]) Yes    SUMMARY OF ONCOLOGIC HISTORY: Oncology History  Malignant neoplasm of lower-outer quadrant of left breast of female, estrogen receptor negative ([place])  09/07/2021 Initial Diagnosis   Screening mammogram detected left breast asymmetry posteriorly.  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  Report date is September 26, 2021 ([place]) and September 29, 2021 (expanded).    The CancerNext-Expanded gene panel offered by [facility] and includes sequencing, rearrangement, and RNA analysis for the following 77 genes: AIP, ALK, APC, ATM, AXIN2, BAP1, BARD1, BLM, BMPR1A, BRCA1, BRCA2, BRIP1, CDC73, CDH1, CDK4, CDKN1B, CDKN2A, CHEK2, CTNNA1, DICER1, FANCC, FH, FLCN, GALNT12, KIF1B, LZTR1, MAX, MEN1, MET, MLH1, MSH2, MSH3, MSH6, MUTYH, NBN, NF1, NF2, NTHL1, PALB2, PHOX2B, PMS2, POT1, PRKAR1A, PTCH1, PTEN, RAD51C, RAD51D, RB1, RECQL, RET, SDHA, SDHAF2, SDHB, SDHC, SDHD, SMAD4, SMARCA4, SMARCB1, SMARCE1, STK11, SUFU, TMEM127, TP53,  TSC1, TSC2, VHL and XRCC2 (sequencing and deletion/duplication); EGFR, EGLN1, HOXB13, KIT, MITF, PDGFRA, POLD1, and POLE (sequencing only); EPCAM and GREM1 (deletion/duplication only).    10/29/2021 Surgery   Bilateral mastectomies Right mastectomy: Benign, PASH Left mastectomy: Grade 3 IDC with DCIS 1.2 cm, margins negative, 0/5 sentinel lymph nodes negative, ER 0%, PR 0%, HER2 negative, Ki-67 70%   12/03/2021 -  Chemotherapy   Patient is on Treatment Plan : BREAST ADJUVANT DOSE DENSE AC q14d / PACLitaxel q7d       CHIEF COMPLIANT: Follow-up cycle 9 Taxol   INTERVAL HISTORY: Julie Townsend is a 57 y.o. female with the above-mentioned left breast cancer. She presents to the clinic for a follow-up. She reports everything was going well. She denies any numbness or tingling. Staph infection is also getting smaller.   ALLERGIES:  has No Known Allergies.  MEDICATIONS:  Current Outpatient Medications  Medication Sig Dispense Refill   acetaminophen (TYLENOL) 500 MG tablet Take 500-1,000 mg by mouth every 6 (six) hours as needed (pain.).     diazepam (VALIUM) 2 MG tablet Take 1 tablet (2 mg total) by mouth every 12 (twelve) hours as needed for up to 20 doses for muscle spasms. 20 tablet 0   FILGRASTIM IJ Inject as directed once a week.     nortriptyline (PAMELOR) 10 MG capsule Take 1 capsule (10 mg total) by mouth at bedtime. 30 capsule 5   SUMAtriptan (IMITREX) 50 MG tablet TAKE 1 TAB BY MOUTH AS NEEDED FOR MIGRAINE. MAX 2 TABS IN 24 HOURS. MAY REPEAT IN 2 HOURS IF HEADACHE PERSISTS OR RECURS. 30 tablet 1   UBRELVY 100 MG TABS TAKE AS NEEDED (TAKE 1 AT THE EARLIST ONSET OF A  MIGRAINE. MAY REPEAT IN 2 HOURS. MAX 2/24 HOURS). 30 tablet 1   Zavegepant HCl (ZAVZPRET) 10 MG/ACT SOLN Place 10 mg into the nose daily as needed. 6 each 11   ondansetron (ZOFRAN) 4 MG tablet Take 1 tablet (4 mg total) by mouth every 8 (eight) hours as needed for up to 20 doses for nausea or vomiting. 20 tablet 0    No current facility-administered medications for this visit.    PHYSICAL EXAMINATION: ECOG PERFORMANCE STATUS: 1 - Symptomatic but completely ambulatory  Vitals:   04/22/22 1353  BP: 134/83  Pulse: 82  Resp: 18  Temp: (!) 97.3 F (36.3 C)  SpO2: 100%   Filed Weights   04/22/22 1353  Weight: 115 lb 1.6 oz (52.2 kg)      LABORATORY DATA:  I have reviewed the data as listed    Latest Ref Rng & Units 04/15/2022    1:18 PM 04/08/2022   11:22 AM 04/01/2022    1:35 PM  CMP  Glucose 70 - 99 mg/dL 87  90  78   BUN 6 - 20 mg/dL 12  9  10    Creatinine 0.44 - 1.00 mg/dL 0.62  0.55  0.41   Sodium 135 - 145 mmol/L 139  139  139   Potassium 3.5 - 5.1 mmol/L 3.7  4.0  3.9   Chloride 98 - 111 mmol/L 105  106  106   CO2 22 - 32 mmol/L 27  28  28    Calcium 8.9 - 10.3 mg/dL 9.6  9.1  9.0   Total Protein 6.5 - 8.1 g/dL 7.2  6.5  6.3   Total Bilirubin 0.3 - 1.2 mg/dL 0.4  0.2  0.4   Alkaline Phos 38 - 126 U/L 69  60  60   AST 15 - 41 U/L 19  16  18    ALT 0 - 44 U/L 12  11  12      Lab Results  Component Value Date   WBC 11.5 (H) 04/22/2022   HGB 11.6 (L) 04/22/2022   HCT 33.9 (L) 04/22/2022   MCV 96.0 04/22/2022   PLT 283 04/22/2022   NEUTROABS 8.9 (H) 04/22/2022    ASSESSMENT & PLAN:  Malignant neoplasm of lower-outer quadrant of left breast of female, estrogen receptor negative ([place]) 09/07/2021:Screening mammogram detected left breast asymmetry posteriorly.  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, margins negative, 0/5 sentinel lymph nodes negative, ER 0%, PR 0%, HER2 negative, Ki-67 70%   Treatment plan: Adjuvant chemotherapy with Adriamycin and Cytoxan followed by Taxol started 12/03/2021 No role of adjuvant radiation or antiestrogen  therapy --------------------------------------------------------------------------------------------------------------------------------------------------------------------- Current treatment: Completed 4 cycles of dose dense Adriamycin and Cytoxan, today cycle 6 Taxol   Chemotoxicities: Nausea: Severe for 2 to 3 days after chemo  fatigue Chemotherapy-induced anemia: Today's hemoglobin is 11.6 Pain during the last 15 minutes of chemotherapy Body aches and pains at home: Improved with Claritin Neutropenia: Resolved with Neupogen injection given couple of days before each chemo.   She will need a oophorectomy once chemotherapy is completed.   Return to clinic in weekly for Taxol treatments.    No orders of the defined types were placed in this encounter.  The patient has a good understanding of the overall plan. she agrees with  it. she will call with any problems that may develop before the next visit here. Total time spent: 30 mins including face to face time and time spent for planning, charting and co-ordination of care   Harriette Ohara, MD 04/22/22    I Gardiner Coins am acting as a Education administrator for [employer]  I have reviewed the above documentation for accuracy and completeness, and I agree with the above.

## 2022-04-23 ENCOUNTER — Telehealth: Payer: Self-pay

## 2022-04-23 ENCOUNTER — Other Ambulatory Visit: Payer: Self-pay | Admitting: Neurology

## 2022-04-23 MED ORDER — EMGALITY 120 MG/ML ~~LOC~~ SOAJ: 120.0000 mg | SUBCUTANEOUS | 1 refills | Status: DC

## 2022-04-23 NOTE — Telephone Encounter (Signed)
MEDICATION: EMGALITY  PHARMACY: [facility] [address] - [place], [place] - [address] AT [address] (Ph: [phone])   Comments: Patient only has one script left, coupon she uses requires more than one script on file.   **Let patient know to contact pharmacy at the end of the day to make sure medication is ready. **  ** Please notify patient to allow 48-72 hours to process**  **Encourage patient to contact the pharmacy for refills or they can request refills through [facility]**

## 2022-04-23 NOTE — Telephone Encounter (Signed)
Refill sent to [facility] in [place]

## 2022-04-27 ENCOUNTER — Inpatient Hospital Stay: Payer: Commercial Managed Care - HMO

## 2022-04-27 ENCOUNTER — Other Ambulatory Visit: Payer: Self-pay

## 2022-04-27 VITALS — BP 121/91 | HR 78 | Temp 98.2°F | Resp 16

## 2022-04-27 DIAGNOSIS — Z5111 Encounter for antineoplastic chemotherapy: Secondary | ICD-10-CM | POA: Diagnosis not present

## 2022-04-27 DIAGNOSIS — C50512 Malignant neoplasm of lower-outer quadrant of left female breast: Secondary | ICD-10-CM

## 2022-04-27 MED ORDER — FILGRASTIM-AAFI 300 MCG/0.5ML IJ SOSY
300.0000 ug | PREFILLED_SYRINGE | Freq: Once | INTRAMUSCULAR | Status: AC
  Administered 2022-04-27: 300 ug via SUBCUTANEOUS
  Filled 2022-04-27: qty 0.5

## 2022-04-28 MED FILL — Dexamethasone Sodium Phosphate Inj 100 MG/10ML: INTRAMUSCULAR | Qty: 1 | Status: AC

## 2022-04-28 NOTE — Progress Notes (Signed)
Patient Care Team: Pcp, No as PCP - General [name], RN as Oncology Nurse Navigator Mauro Kaufmann, RN as Oncology Nurse Navigator [name], MD as Consulting Physician (General Surgery) Nicholas Lose, MD as Consulting Physician (Hematology and Oncology) Kyung Rudd, MD as Consulting Physician (Radiation Oncology)  DIAGNOSIS: No diagnosis found.  SUMMARY OF ONCOLOGIC HISTORY: Oncology History  Malignant neoplasm of lower-outer quadrant of left breast of female, estrogen receptor negative ([place])  09/07/2021 Initial Diagnosis   Screening mammogram detected left breast asymmetry posteriorly.  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  Report date is September 26, 2021 ([place]) and September 29, 2021 (expanded).    The CancerNext-Expanded gene panel offered by [facility] and includes sequencing, rearrangement, and RNA analysis for the following 77 genes: AIP, ALK, APC, ATM, AXIN2, BAP1, BARD1, BLM, BMPR1A, BRCA1, BRCA2, BRIP1, CDC73, CDH1, CDK4, CDKN1B, CDKN2A, CHEK2, CTNNA1, DICER1, FANCC, FH, FLCN, GALNT12, KIF1B, LZTR1, MAX, MEN1, MET, MLH1, MSH2, MSH3, MSH6, MUTYH, NBN, NF1, NF2, NTHL1, PALB2, PHOX2B, PMS2, POT1, PRKAR1A, PTCH1, PTEN, RAD51C, RAD51D, RB1, RECQL, RET, SDHA, SDHAF2, SDHB, SDHC, SDHD, SMAD4, SMARCA4, SMARCB1, SMARCE1, STK11, SUFU, TMEM127, TP53, TSC1, TSC2, VHL and XRCC2 (sequencing and deletion/duplication); EGFR, EGLN1, HOXB13, KIT, MITF, PDGFRA, POLD1, and POLE  (sequencing only); EPCAM and GREM1 (deletion/duplication only).    10/29/2021 Surgery   Bilateral mastectomies Right mastectomy: Benign, PASH Left mastectomy: Grade 3 IDC with DCIS 1.2 cm, margins negative, 0/5 sentinel lymph nodes negative, ER 0%, PR 0%, HER2 negative, Ki-67 70%   12/03/2021 -  Chemotherapy   Patient is on Treatment Plan : BREAST ADJUVANT DOSE DENSE AC q14d / PACLitaxel q7d       CHIEF COMPLIANT: Cycle 6 Taxol   INTERVAL HISTORY: Julie Townsend is a 57 y.o. female with the above-mentioned left breast cancer. She presents to the clinic for a follow-up.    ALLERGIES:  has No Known Allergies.  MEDICATIONS:  Current Outpatient Medications  Medication Sig Dispense Refill   acetaminophen (TYLENOL) 500 MG tablet Take 500-1,000 mg by mouth every 6 (six) hours as needed (pain.).     diazepam (VALIUM) 2 MG tablet Take 1 tablet (2 mg total) by mouth every 12 (twelve) hours as needed for up to 20 doses for muscle spasms. 20 tablet 0   FILGRASTIM IJ Inject as directed once a week.     Galcanezumab-gnlm (EMGALITY) 120 MG/ML SOAJ Inject 120 mg into the skin every 28 (twenty-eight) days. 1 mL 1   nortriptyline (PAMELOR) 10 MG capsule Take 1 capsule (10 mg total) by mouth at bedtime. 30 capsule 5   ondansetron (ZOFRAN) 4 MG tablet Take 1 tablet (4 mg total) by mouth every 8 (eight) hours as needed for up to 20 doses for nausea or vomiting. 20 tablet 0   SUMAtriptan (IMITREX) 50 MG tablet TAKE 1 TAB BY MOUTH AS NEEDED FOR MIGRAINE. MAX 2 TABS IN 24 HOURS. MAY REPEAT IN 2 HOURS IF HEADACHE PERSISTS OR RECURS. 30 tablet 1   UBRELVY 100  MG TABS TAKE AS NEEDED (TAKE 1 AT THE EARLIST ONSET OF A MIGRAINE. MAY REPEAT IN 2 HOURS. MAX 2/24 HOURS). 30 tablet 1   Zavegepant HCl (ZAVZPRET) 10 MG/ACT SOLN Place 10 mg into the nose daily as needed. 6 each 11   No current facility-administered medications for this visit.    PHYSICAL EXAMINATION: ECOG PERFORMANCE STATUS: {CHL ONC ECOG  PS:[phone]}  There were no vitals filed for this visit. There were no vitals filed for this visit.  BREAST:*** No palpable masses or nodules in either right or left breasts. No palpable axillary supraclavicular or infraclavicular adenopathy no breast tenderness or nipple discharge. (exam performed in the presence of a chaperone)  LABORATORY DATA:  I have reviewed the data as listed    Latest Ref Rng & Units 04/22/2022    1:39 PM 04/15/2022    1:18 PM 04/08/2022   11:22 AM  CMP  Glucose 70 - 99 mg/dL 91  87  90   BUN 6 - 20 mg/dL 10  12  9    Creatinine 0.44 - 1.00 mg/dL 0.54  0.62  0.55   Sodium 135 - 145 mmol/L 140  139  139   Potassium 3.5 - 5.1 mmol/L 3.7  3.7  4.0   Chloride 98 - 111 mmol/L 104  105  106   CO2 22 - 32 mmol/L 30  27  28    Calcium 8.9 - 10.3 mg/dL 9.6  9.6  9.1   Total Protein 6.5 - 8.1 g/dL 6.8  7.2  6.5   Total Bilirubin 0.3 - 1.2 mg/dL 0.3  0.4  0.2   Alkaline Phos 38 - 126 U/L 83  69  60   AST 15 - 41 U/L 16  19  16    ALT 0 - 44 U/L 12  12  11      Lab Results  Component Value Date   WBC 11.5 (H) 04/22/2022   HGB 11.6 (L) 04/22/2022   HCT 33.9 (L) 04/22/2022   MCV 96.0 04/22/2022   PLT 283 04/22/2022   NEUTROABS 8.9 (H) 04/22/2022    ASSESSMENT & PLAN:  No problem-specific Assessment & Plan notes found for this encounter.    No orders of the defined types were placed in this encounter.  The patient has a good understanding of the overall plan. she agrees with it. she will call with any problems that may develop before the next visit here. Total time spent: 30 mins including face to face time and time spent for planning, charting and co-ordination of care   Suzzette Righter, [place] 04/28/22    I Gardiner Coins am acting as a Education administrator for [employer]  ***

## 2022-04-29 ENCOUNTER — Inpatient Hospital Stay: Payer: Commercial Managed Care - HMO

## 2022-04-29 ENCOUNTER — Other Ambulatory Visit: Payer: Self-pay

## 2022-04-29 ENCOUNTER — Inpatient Hospital Stay (HOSPITAL_BASED_OUTPATIENT_CLINIC_OR_DEPARTMENT_OTHER): Payer: Commercial Managed Care - HMO | Admitting: Adult Health

## 2022-04-29 ENCOUNTER — Encounter: Payer: Self-pay | Admitting: *Deleted

## 2022-04-29 ENCOUNTER — Encounter: Payer: Self-pay | Admitting: Adult Health

## 2022-04-29 VITALS — BP 120/84 | HR 87 | Temp 99.0°F | Resp 16

## 2022-04-29 DIAGNOSIS — C50512 Malignant neoplasm of lower-outer quadrant of left female breast: Secondary | ICD-10-CM

## 2022-04-29 DIAGNOSIS — Z171 Estrogen receptor negative status [ER-]: Secondary | ICD-10-CM

## 2022-04-29 DIAGNOSIS — Z95828 Presence of other vascular implants and grafts: Secondary | ICD-10-CM

## 2022-04-29 DIAGNOSIS — Z5111 Encounter for antineoplastic chemotherapy: Secondary | ICD-10-CM | POA: Diagnosis not present

## 2022-04-29 LAB — CMP (CANCER CENTER ONLY)
ALT: 13 U/L (ref 0–44)
AST: 17 U/L (ref 15–41)
Albumin: 4.3 g/dL (ref 3.5–5.0)
Alkaline Phosphatase: 79 U/L (ref 38–126)
Anion gap: 6 (ref 5–15)
BUN: 12 mg/dL (ref 6–20)
CO2: 30 mmol/L (ref 22–32)
Calcium: 9.7 mg/dL (ref 8.9–10.3)
Chloride: 104 mmol/L (ref 98–111)
Creatinine: 0.57 mg/dL (ref 0.44–1.00)
GFR, Estimated: >60 mL/min (ref >60)
Glucose, Bld: 88 mg/dL (ref 70–99)
Potassium: 3.9 mmol/L (ref 3.5–5.1)
Sodium: 140 mmol/L (ref 135–145)
Total Bilirubin: 0.3 mg/dL (ref 0.3–1.2)
Total Protein: 7.1 g/dL (ref 6.5–8.1)

## 2022-04-29 LAB — CBC WITH DIFFERENTIAL (CANCER CENTER ONLY)
Abs Immature Granulocytes: 0.08 10*3/uL — ABNORMAL HIGH (ref 0.00–0.07)
Basophils Absolute: 0.1 10*3/uL (ref 0.0–0.1)
Basophils Relative: 1 %
Eosinophils Absolute: 0.1 10*3/uL (ref 0.0–0.5)
Eosinophils Relative: 1 %
HCT: 35.6 % — ABNORMAL LOW (ref 36.0–46.0)
Hemoglobin: 11.8 g/dL — ABNORMAL LOW (ref 12.0–15.0)
Immature Granulocytes: 1 %
Lymphocytes Relative: 17 %
Lymphs Abs: 1.7 10*3/uL (ref 0.7–4.0)
MCH: 32.2 pg (ref 26.0–34.0)
MCHC: 33.1 g/dL (ref 30.0–36.0)
MCV: 97.3 fL (ref 80.0–100.0)
Monocytes Absolute: 0.4 10*3/uL (ref 0.1–1.0)
Monocytes Relative: 4 %
Neutro Abs: 7.8 10*3/uL — ABNORMAL HIGH (ref 1.7–7.7)
Neutrophils Relative %: 76 %
Platelet Count: 255 10*3/uL (ref 150–400)
RBC: 3.66 MIL/uL — ABNORMAL LOW (ref 3.87–5.11)
RDW: 14.4 % (ref 11.5–15.5)
WBC Count: 10 10*3/uL (ref 4.0–10.5)
nRBC: 0 % (ref 0.0–0.2)

## 2022-04-29 MED ORDER — FAMOTIDINE IN NACL 20-0.9 MG/50ML-% IV SOLN
20.0000 mg | Freq: Once | INTRAVENOUS | Status: AC
  Administered 2022-04-29: 20 mg via INTRAVENOUS
  Filled 2022-04-29: qty 50

## 2022-04-29 MED ORDER — PALONOSETRON HCL INJECTION 0.25 MG/5ML
0.2500 mg | Freq: Once | INTRAVENOUS | Status: AC
  Administered 2022-04-29: 0.25 mg via INTRAVENOUS
  Filled 2022-04-29: qty 5

## 2022-04-29 MED ORDER — SODIUM CHLORIDE 0.9% FLUSH
10.0000 mL | Freq: Once | INTRAVENOUS | Status: AC
  Administered 2022-04-29: 10 mL

## 2022-04-29 MED ORDER — HEPARIN SOD (PORK) LOCK FLUSH 100 UNIT/ML IV SOLN
500.0000 [IU] | Freq: Once | INTRAVENOUS | Status: AC | PRN
  Administered 2022-04-29: 500 [IU]

## 2022-04-29 MED ORDER — SODIUM CHLORIDE 0.9 % IV SOLN
10.0000 mg | Freq: Once | INTRAVENOUS | Status: AC
  Administered 2022-04-29: 10 mg via INTRAVENOUS
  Filled 2022-04-29: qty 10

## 2022-04-29 MED ORDER — SODIUM CHLORIDE 0.9 % IV SOLN
65.0000 mg/m2 | Freq: Once | INTRAVENOUS | Status: AC
  Administered 2022-04-29: 96 mg via INTRAVENOUS
  Filled 2022-04-29: qty 16

## 2022-04-29 MED ORDER — SODIUM CHLORIDE 0.9% FLUSH
10.0000 mL | INTRAVENOUS | Status: DC | PRN
  Administered 2022-04-29: 10 mL

## 2022-04-29 MED ORDER — DIPHENHYDRAMINE HCL 50 MG/ML IJ SOLN
25.0000 mg | Freq: Once | INTRAMUSCULAR | Status: AC
  Administered 2022-04-29: 25 mg via INTRAVENOUS
  Filled 2022-04-29: qty 1

## 2022-04-29 MED ORDER — SODIUM CHLORIDE 0.9 % IV SOLN: Freq: Once | INTRAVENOUS | Status: AC

## 2022-04-29 NOTE — Progress Notes (Signed)
Julie Townsend Follow up:    Pcp, No No address on file   DIAGNOSIS:  Townsend Staging  Malignant neoplasm of lower-outer quadrant of left breast of female, estrogen receptor negative ([place]) Staging form: Breast, AJCC 8th Edition - Clinical stage from 09/14/2021: Stage IB (cT1c, cN0, cM0, G3, ER-, PR-, HER2-) - Signed by Hayden Pedro, PA-C on 09/14/2021 Stage prefix: Initial diagnosis Method of lymph node assessment: Clinical Histologic grading system: 3 grade system   SUMMARY OF ONCOLOGIC HISTORY: Oncology History  Malignant neoplasm of lower-outer quadrant of left breast of female, estrogen receptor negative ([place])  09/07/2021 Initial Diagnosis   Screening mammogram detected left breast asymmetry posteriorly.  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, Ki-67 70%   09/14/2021 Townsend Staging   Staging form: Breast, AJCC 8th Edition - Clinical stage from 09/14/2021: Stage IB (cT1c, cN0, cM0, G3, ER-, PR-, HER2-) - Signed by Hayden Pedro, PA-C on 09/14/2021 Stage prefix: Initial diagnosis Method of lymph node assessment: Clinical Histologic grading system: 3 grade system   09/26/2021 Genetic Testing   Pathogenic variant in BRCA2 at p.RC:[id number]* (c.3922G>T).  Report date is September 26, 2021 ([place]) and September 29, 2021 (expanded).    The CancerNext-Expanded gene panel offered by [facility] and includes sequencing, rearrangement, and RNA analysis for the following 77 genes: AIP, ALK, APC, ATM, AXIN2, BAP1, BARD1, BLM, BMPR1A, BRCA1, BRCA2, BRIP1, CDC73, CDH1, CDK4, CDKN1B, CDKN2A, CHEK2, CTNNA1, DICER1, FANCC, FH, FLCN, GALNT12, KIF1B, LZTR1, MAX, MEN1, MET, MLH1, MSH2, MSH3, MSH6, MUTYH, NBN, NF1, NF2, NTHL1, PALB2, PHOX2B, PMS2, POT1, PRKAR1A, PTCH1, PTEN, RAD51C, RAD51D, RB1, RECQL, RET, SDHA, SDHAF2, SDHB, SDHC, SDHD, SMAD4, SMARCA4, SMARCB1, SMARCE1, STK11, SUFU, TMEM127,  TP53, TSC1, TSC2, VHL and XRCC2 (sequencing and deletion/duplication); EGFR, EGLN1, HOXB13, KIT, MITF, PDGFRA, POLD1, and POLE (sequencing only); EPCAM and GREM1 (deletion/duplication only).    10/29/2021 Surgery   Bilateral mastectomies Right mastectomy: Benign, PASH Left mastectomy: Grade 3 IDC with DCIS 1.2 cm, margins negative, 0/5 sentinel lymph nodes negative, ER 0%, PR 0%, HER2 negative, Ki-67 70%   12/03/2021 -  Chemotherapy   Patient is on Treatment Plan : BREAST ADJUVANT DOSE DENSE AC q14d / PACLitaxel q7d       CURRENT THERAPY: Taxol  INTERVAL HISTORY: Julie Townsend 57 y.o. female returns for f/u prior to receiving weekly Taxol therapy.  She tells me that she is tolerating this well and denies any issues with this, in particular peripheral neuropathy.   She receives [employer] due to treatment related neutropenia and her WBC are normal today.  Earlier in her treatment she developed a breast infection which required multiple antibiotics and had to go back to surgery for wash out with Dr. Marla Roe. She tells me that her breast continues to heal well and she has no signs of infection.     Patient Active Problem List   Diagnosis Date Noted   Cellulitis of right breast 03/10/2022   Medication management 03/10/2022   Port-A-Cath in place 03/10/2022   Breast Townsend ([place]) 10/29/2021   BRCA2 gene mutation positive 09/26/2021   Genetic testing 09/26/2021   Family history of breast Townsend 09/22/2021   Family history of pancreatic Townsend 09/22/2021   Family history of ovarian Townsend 09/22/2021   Malignant neoplasm of lower-outer quadrant of left breast of female, estrogen receptor negative ([place]) 09/11/2021    has No Known Allergies.  MEDICAL HISTORY: Past Medical History:  Diagnosis Date   BRCA2 gene mutation positive 09/26/2021   Breast Townsend ([place]) 09/2021   left breast IDC   Family history of breast Townsend 09/22/2021   Family history of ovarian  Townsend 09/22/2021   Family history of pancreatic Townsend 09/22/2021   Genetic testing 09/26/2021   Pathogenic variant in BRCA2 at p.FU:[id number]* (c.3922G>T).  Report date is September 26, 2021.    The BRCAplus panel offered by [employer] and includes sequencing and deletion/duplication analysis for the following 8 genes: ATM, BRCA1, BRCA2, CDH1, CHEK2, PALB2, PTEN, and TP53.  Results of pan-Townsend panel are pending.     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    correct Hands    SURGICAL HISTORY: Past Surgical History:  Procedure Laterality Date   BREAST IMPLANT REMOVAL Right 12/17/2021   Procedure: Right breast expander removal with washout;  Surgeon: Wallace Going, DO;  Location: [place];  Service: Plastics;  Laterality: Right;   BREAST RECONSTRUCTION WITH PLACEMENT OF TISSUE EXPANDER AND FLEX HD (ACELLULAR HYDRATED DERMIS) Bilateral 10/29/2021   Procedure: BREAST RECONSTRUCTION WITH PLACEMENT OF TISSUE EXPANDER AND FLEX HD (ACELLULAR HYDRATED DERMIS);  Surgeon: Wallace Going, DO;  Location: [place];  Service: Plastics;  Laterality: Bilateral;   CARPAL TUNNEL RELEASE Right 06/20/2020   Procedure: CARPAL TUNNEL RELEASE;  Surgeon: [name], MD;  Location: [place];  Service: Orthopedics;  Laterality: Right;   IRRIGATION AND DEBRIDEMENT OF WOUND WITH SPLIT THICKNESS SKIN GRAFT Right 03/15/2022   Procedure: Debridement of right breast;  Surgeon: Wallace Going, DO;  Location: [place];  Service: Plastics;  Laterality: Right;   MASTECTOMY W/ SENTINEL NODE BIOPSY Left 10/29/2021   Procedure: LEFT MASTECTOMY WITH LEFT SENTINEL LYMPH NODE BIOPSY;  Surgeon: [name], MD;  Location: [place];  Service: General;  Laterality: Left;   PORTACATH PLACEMENT Right 10/29/2021   Procedure: INSERTION PORT-A-CATH;  Surgeon: [name], MD;  Location: [place];  Service:  General;  Laterality: Right;   TOTAL MASTECTOMY Right 10/29/2021   Procedure: RIGHT TOTAL MASTECTOMY;  Surgeon: [name], MD;  Location: [place];  Service: General;  Laterality: Right;   TRIGGER FINGER RELEASE Left 05/08/2020   Procedure: LEFT LONG FINGER TRIGGER RELEASE AND LEFT RING FINGER TRIGGER RELEASE;  Surgeon: [name], MD;  Location: [place];  Service: Orthopedics;  Laterality: Left;   trigger finger release rt hand      SOCIAL HISTORY: Social History   Socioeconomic History   Marital status: Married    Spouse name: Not on file   Number of children: Not on file   Years of education: Not on file   Highest education level: Not on file  Occupational History   Not on file  Tobacco Use   Smoking status: Never    Passive exposure: Never   Smokeless tobacco: Never  Vaping Use   Vaping Use: Never used  Substance and Sexual Activity   Alcohol use: Never   Drug use: Never   Sexual activity: Yes    Birth control/protection: Post-menopausal  Other Topics Concern   Not on file  Social History Narrative   Lives with husband.   Right handed   Social Determinants of Health   Financial Resource Strain: Not on file  Food Insecurity: Not on file  Transportation Needs: Not on file  Physical Activity: Not on file  Stress: Not on file  Social Connections: Not on file  Intimate Partner Violence: Not on file    FAMILY HISTORY: Family History  Problem Relation Age of Onset   Migraines Mother    Breast Townsend Mother 54       recurr at  49   Migraines Maternal Aunt    Colon Townsend Maternal Uncle        dx late 3s   Pancreatic Townsend Maternal Uncle 57   Migraines Maternal Grandmother    Ovarian Townsend Maternal Grandmother        dx 58s    Review of Systems  Constitutional:  Positive for fatigue. Negative for appetite change, chills, fever and unexpected weight change.  HENT:   Negative for hearing loss, lump/mass and trouble  swallowing.   Eyes:  Negative for eye problems and icterus.  Respiratory:  Negative for chest tightness, cough and shortness of breath.   Cardiovascular:  Negative for chest pain, leg swelling and palpitations.  Gastrointestinal:  Negative for abdominal distention, abdominal pain, constipation, diarrhea, nausea and vomiting.  Endocrine: Negative for hot flashes.  Genitourinary:  Negative for difficulty urinating.   Musculoskeletal:  Negative for arthralgias.  Skin:  Negative for itching and rash.  Neurological:  Negative for dizziness, extremity weakness, headaches and numbness.  Hematological:  Negative for adenopathy. Does not bruise/bleed easily.  Psychiatric/Behavioral:  Negative for depression. The patient is not nervous/anxious.       PHYSICAL EXAMINATION  ECOG PERFORMANCE STATUS: 1 - Symptomatic but completely ambulatory  Vitals:   04/29/22 1243  BP: 122/79  Pulse: 82  Resp: 18  Temp: 97.6 F (36.4 C)  SpO2: 100%    Physical Exam Constitutional:      General: She is not in acute distress.    Appearance: Normal appearance. She is not toxic-appearing.  HENT:     Head: Normocephalic and atraumatic.     Mouth/Throat:     Mouth: Mucous membranes are moist.     Pharynx: Oropharynx is clear. No oropharyngeal exudate or posterior oropharyngeal erythema.  Eyes:     General: No scleral icterus. Cardiovascular:     Rate and Rhythm: Normal rate and regular rhythm.     Pulses: Normal pulses.     Heart sounds: Normal heart sounds.  Pulmonary:     Effort: Pulmonary effort is normal.     Breath sounds: Normal breath sounds.  Abdominal:     General: Abdomen is flat. Bowel sounds are normal. There is no distension.     Palpations: Abdomen is soft.     Tenderness: There is no abdominal tenderness.  Musculoskeletal:        General: No swelling.     Cervical back: Neck supple.  Lymphadenopathy:     Cervical: No cervical adenopathy.  Skin:    General: Skin is warm and dry.      Findings: No rash.  Neurological:     General: No focal deficit present.     Mental Status: She is alert.  Psychiatric:        Mood and Affect: Mood normal.        Behavior: Behavior normal.     LABORATORY DATA:  CBC    Component Value Date/Time   WBC 10.0 04/29/2022 1221   WBC 3.7 (L) 03/10/2022 1148   RBC 3.66 (L) 04/29/2022 1221   HGB 11.8 (L) 04/29/2022 1221   HCT 35.6 (L) 04/29/2022 1221   PLT 255 04/29/2022 1221   MCV 97.3 04/29/2022  1221   MCH 32.2 04/29/2022 1221   MCHC 33.1 04/29/2022 1221   RDW 14.4 04/29/2022 1221   LYMPHSABS 1.7 04/29/2022 1221   MONOABS 0.4 04/29/2022 1221   EOSABS 0.1 04/29/2022 1221   BASOSABS 0.1 04/29/2022 1221    CMP     Component Value Date/Time   NA 140 04/29/2022 1221   K 3.9 04/29/2022 1221   CL 104 04/29/2022 1221   CO2 30 04/29/2022 1221   GLUCOSE 88 04/29/2022 1221   BUN 12 04/29/2022 1221   CREATININE 0.57 04/29/2022 1221   CALCIUM 9.7 04/29/2022 1221   PROT 7.1 04/29/2022 1221   ALBUMIN 4.3 04/29/2022 1221   AST 17 04/29/2022 1221   ALT 13 04/29/2022 1221   ALKPHOS 79 04/29/2022 1221   BILITOT 0.3 04/29/2022 1221   GFRNONAA >60 04/29/2022 1221          ASSESSMENT and THERAPY PLAN:   Malignant neoplasm of lower-outer quadrant of left breast of female, estrogen receptor negative (HCC) Julie Townsend is a 57 year old woman with history of stage Ib triple negative breast Townsend diagnosed in August 2023 status post bilateral mastectomies followed by adjuvant chemotherapy with dose dense Adriamycin and Cytoxan every 2 weeks x 4 cycles now being followed by weekly paclitaxel.  Treatment Plan:  Bilateral mastectomies with left SLNB Adjuvant chemotherapy with Adriamycin and Cytoxan x 4 followed by weekly Paclitaxel x 12. No role for adjuvant radiation No role for adjuvant antiestrogen therapy  Chemo Toxicities: Fatigue-energy conservation Recurrent breast cellulitis-resolved after surgery with Dr.  Marla Roe Treatment related neutropenia: Nivestym on day 5 of each cycle.    Julie Townsend will return in each week for treatment and will see myself or Dr. Lindi Adie with every other cycle.     All questions were answered. The patient knows to call the clinic with any problems, questions or concerns. We can certainly see the patient much sooner if necessary.  Total encounter time:20 minutes*in face-to-face visit time, chart review, lab review, care coordination, order entry, and documentation of the encounter time.  Wilber Bihari, NP 05/02/22 4:31 PM Medical Oncology and Hematology [facility] [place], [place] [address] Tel. [phone]    Fax. [phone]  *Total Encounter Time as defined by the Centers for Medicare and Medicaid Services includes, in addition to the face-to-face time of a patient visit (documented in the note above) non-face-to-face time: obtaining and reviewing outside history, ordering and reviewing medications, tests or procedures, care coordination (communications with other health care professionals or caregivers) and documentation in the medical record.

## 2022-04-29 NOTE — Patient Instructions (Signed)
[facility]  Discharge Instructions: Thank you for choosing [facility] to provide your oncology and hematology care.   If you have a lab appointment with the [facility], please go directly to the [facility] and check in at the registration area.   Wear comfortable clothing and clothing appropriate for easy access to any Portacath or PICC line.   We strive to give you quality time with your provider. You may need to reschedule your appointment if you arrive late (15 or more minutes).  Arriving late affects you and other patients whose appointments are after yours.  Also, if you miss three or more appointments without notifying the office, you may be dismissed from the clinic at the provider's discretion.      For prescription refill requests, have your pharmacy contact our office and allow 72 hours for refills to be completed.    Today you received the following chemotherapy and/or immunotherapy agent: Paclitaxel (Taxol)    To help prevent nausea and vomiting after your treatment, we encourage you to take your nausea medication as directed.  BELOW ARE SYMPTOMS THAT SHOULD BE REPORTED IMMEDIATELY: *FEVER GREATER THAN 100.4 F (38 C) OR HIGHER *CHILLS OR SWEATING *NAUSEA AND VOMITING THAT IS NOT CONTROLLED WITH YOUR NAUSEA MEDICATION *UNUSUAL SHORTNESS OF BREATH *UNUSUAL BRUISING OR BLEEDING *URINARY PROBLEMS (pain or burning when urinating, or frequent urination) *BOWEL PROBLEMS (unusual diarrhea, constipation, pain near the anus) TENDERNESS IN MOUTH AND THROAT WITH OR WITHOUT PRESENCE OF ULCERS (sore throat, sores in mouth, or a toothache) UNUSUAL RASH, SWELLING OR PAIN  UNUSUAL VAGINAL DISCHARGE OR ITCHING   Items with * indicate a potential emergency and should be followed up as soon as possible or go to the Emergency Department if any problems should occur.  Please show the CHEMOTHERAPY ALERT CARD or IMMUNOTHERAPY ALERT CARD at  check-in to the Emergency Department and triage nurse.  Should you have questions after your visit or need to cancel or reschedule your appointment, please contact [facility]  Dept: [phone]  and follow the prompts.  Office hours are 8:00 a.m. to 4:30 p.m. Monday - Friday. Please note that voicemails left after 4:00 p.m. may not be returned until the following business day.  We are closed weekends and major holidays. You have access to a nurse at all times for urgent questions. Please call the main number to the clinic Dept: [phone] and follow the prompts.   For any non-urgent questions, you may also contact your provider using MyChart. We now offer e-Visits for anyone 67 and older to request care online for non-urgent symptoms. For details visit [email or web address].   Also download the MyChart app! Go to the app store, search "MyChart", open the app, select [facility], and log in with your MyChart username and password.  Paclitaxel Injection What is this medication? PACLITAXEL (PAK li TAX el) treats some types of cancer. It works by slowing down the growth of cancer cells. This medicine may be used for other purposes; ask your health care provider or pharmacist if you have questions. COMMON BRAND NAME(S): Onxol, Taxol What should I tell my care team before I take this medication? They need to know if you have any of these conditions: Heart disease Liver disease Low white blood cell levels An unusual or allergic reaction to paclitaxel, other medications, foods, dyes, or preservatives If you or your partner are pregnant or trying to get pregnant  Breast-feeding How should I use this medication? This medication is injected into a vein. It is given by your care team in a hospital or clinic setting. Talk to your care team about the use of this medication in children. While it may be given to children for selected conditions, precautions do  apply. Overdosage: If you think you have taken too much of this medicine contact a poison control center or emergency room at once. NOTE: This medicine is only for you. Do not share this medicine with others. What if I miss a dose? Keep appointments for follow-up doses. It is important not to miss your dose. Call your care team if you are unable to keep an appointment. What may interact with this medication? Do not take this medication with any of the following: Live virus vaccines Other medications may affect the way this medication works. Talk with your care team about all of the medications you take. They may suggest changes to your treatment plan to lower the risk of side effects and to make sure your medications work as intended. This list may not describe all possible interactions. Give your health care provider a list of all the medicines, herbs, non-prescription drugs, or dietary supplements you use. Also tell them if you smoke, drink alcohol, or use illegal drugs. Some items may interact with your medicine. What should I watch for while using this medication? Your condition will be monitored carefully while you are receiving this medication. You may need blood work while taking this medication. This medication may make you feel generally unwell. This is not uncommon as chemotherapy can affect healthy cells as well as cancer cells. Report any side effects. Continue your course of treatment even though you feel ill unless your care team tells you to stop. This medication can cause serious allergic reactions. To reduce the risk, your care team may give you other medications to take before receiving this one. Be sure to follow the directions from your care team. This medication may increase your risk of getting an infection. Call your care team for advice if you get a fever, chills, sore throat, or other symptoms of a cold or flu. Do not treat yourself. Try to avoid being around people who are  sick. This medication may increase your risk to bruise or bleed. Call your care team if you notice any unusual bleeding. Be careful brushing or flossing your teeth or using a toothpick because you may get an infection or bleed more easily. If you have any dental work done, tell your dentist you are receiving this medication. Talk to your care team if you may be pregnant. Serious birth defects can occur if you take this medication during pregnancy. Talk to your care team before breastfeeding. Changes to your treatment plan may be needed. What side effects may I notice from receiving this medication? Side effects that you should report to your care team as soon as possible: Allergic reactions--skin rash, itching, hives, swelling of the face, lips, tongue, or throat Heart rhythm changes--fast or irregular heartbeat, dizziness, feeling faint or lightheaded, chest pain, trouble breathing Increase in blood pressure Infection--fever, chills, cough, sore throat, wounds that don't heal, pain or trouble when passing urine, general feeling of discomfort or being unwell Low blood pressure--dizziness, feeling faint or lightheaded, blurry vision Low red blood cell level--unusual weakness or fatigue, dizziness, headache, trouble breathing Painful swelling, warmth, or redness of the skin, blisters or sores at the infusion site Pain, tingling, or numbness in the  hands or feet Slow heartbeat--dizziness, feeling faint or lightheaded, confusion, trouble breathing, unusual weakness or fatigue Unusual bruising or bleeding Side effects that usually do not require medical attention (report to your care team if they continue or are bothersome): Diarrhea Hair loss Joint pain Loss of appetite Muscle pain Nausea Vomiting This list may not describe all possible side effects. Call your doctor for medical advice about side effects. You may report side effects to FDA at 1-800-FDA-1088. Where should I keep my  medication? This medication is given in a hospital or clinic. It will not be stored at home. NOTE: This sheet is a summary. It may not cover all possible information. If you have questions about this medicine, talk to your doctor, pharmacist, or health care provider.  2023 Elsevier/Gold Standard (2021-06-04 00:00:00)

## 2022-05-02 ENCOUNTER — Encounter: Payer: Self-pay | Admitting: Hematology and Oncology

## 2022-05-02 NOTE — Assessment & Plan Note (Signed)
Julie Townsend is a 57 year old woman with history of stage Ib triple negative breast cancer diagnosed in August 2023 status post bilateral mastectomies followed by adjuvant chemotherapy with dose dense Adriamycin and Cytoxan every 2 weeks x 4 cycles now being followed by weekly paclitaxel.  Treatment Plan:  Bilateral mastectomies with left SLNB Adjuvant chemotherapy with Adriamycin and Cytoxan x 4 followed by weekly Paclitaxel x 12. No role for adjuvant radiation No role for adjuvant antiestrogen therapy  Chemo Toxicities: Fatigue-energy conservation Recurrent breast cellulitis-resolved after surgery with Dr. Marla Roe Treatment related neutropenia: Nivestym on day 5 of each cycle.    Julie Townsend will return in each week for treatment and will see myself or Dr. Lindi Adie with every other cycle.

## 2022-05-04 ENCOUNTER — Encounter: Payer: Self-pay | Admitting: Physician Assistant

## 2022-05-04 ENCOUNTER — Other Ambulatory Visit: Payer: Self-pay

## 2022-05-04 ENCOUNTER — Ambulatory Visit (INDEPENDENT_AMBULATORY_CARE_PROVIDER_SITE_OTHER): Payer: Commercial Managed Care - HMO | Admitting: Physician Assistant

## 2022-05-04 ENCOUNTER — Inpatient Hospital Stay: Payer: Commercial Managed Care - HMO | Attending: Hematology and Oncology

## 2022-05-04 ENCOUNTER — Telehealth: Payer: Self-pay | Admitting: Hematology and Oncology

## 2022-05-04 VITALS — BP 117/81 | HR 89

## 2022-05-04 VITALS — BP 130/91 | HR 86 | Temp 98.1°F | Resp 16

## 2022-05-04 DIAGNOSIS — T451X5A Adverse effect of antineoplastic and immunosuppressive drugs, initial encounter: Secondary | ICD-10-CM | POA: Insufficient documentation

## 2022-05-04 DIAGNOSIS — R110 Nausea: Secondary | ICD-10-CM | POA: Insufficient documentation

## 2022-05-04 DIAGNOSIS — R42 Dizziness and giddiness: Secondary | ICD-10-CM | POA: Diagnosis not present

## 2022-05-04 DIAGNOSIS — R5383 Other fatigue: Secondary | ICD-10-CM | POA: Insufficient documentation

## 2022-05-04 DIAGNOSIS — Z9889 Other specified postprocedural states: Secondary | ICD-10-CM

## 2022-05-04 DIAGNOSIS — Z5189 Encounter for other specified aftercare: Secondary | ICD-10-CM | POA: Insufficient documentation

## 2022-05-04 DIAGNOSIS — D709 Neutropenia, unspecified: Secondary | ICD-10-CM | POA: Insufficient documentation

## 2022-05-04 DIAGNOSIS — Z171 Estrogen receptor negative status [ER-]: Secondary | ICD-10-CM | POA: Diagnosis not present

## 2022-05-04 DIAGNOSIS — D6481 Anemia due to antineoplastic chemotherapy: Secondary | ICD-10-CM | POA: Insufficient documentation

## 2022-05-04 DIAGNOSIS — Z5111 Encounter for antineoplastic chemotherapy: Secondary | ICD-10-CM | POA: Insufficient documentation

## 2022-05-04 DIAGNOSIS — C50512 Malignant neoplasm of lower-outer quadrant of left female breast: Secondary | ICD-10-CM | POA: Diagnosis not present

## 2022-05-04 DIAGNOSIS — Z9013 Acquired absence of bilateral breasts and nipples: Secondary | ICD-10-CM | POA: Insufficient documentation

## 2022-05-04 DIAGNOSIS — Z1501 Genetic susceptibility to malignant neoplasm of breast: Secondary | ICD-10-CM | POA: Diagnosis not present

## 2022-05-04 MED ORDER — FILGRASTIM-AAFI 300 MCG/0.5ML IJ SOSY
300.0000 ug | PREFILLED_SYRINGE | Freq: Once | INTRAMUSCULAR | Status: AC
  Administered 2022-05-04: 300 ug via SUBCUTANEOUS
  Filled 2022-05-04: qty 0.5

## 2022-05-04 NOTE — Progress Notes (Signed)
Patient is a pleasant 57 year old female with PMH of left-sided breast cancer s/p bilateral mastectomy with immediate reconstruction using tissue expander and Flex HD performed 10/29/2021 and subsequent right-sided infection and expander removal 12/17/2021 by Dr. Marla Roe.  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  Reviewed operative report and Cellerate was placed at medial aspect of mastectomy incision where it was not fully closed primarily.   She was last seen here in clinic on 04/21/2022.  At that time, she was doing well and felt as though her wound was improving considerably.  She reported the pain had improved and her exam was reassuring.  She was provided with additional calcium alginate with which she could continue with daily dressing changes.  Discussed ultimate requirement for latissimus dorsi flap reconstruction, but will want to wait until a month after she has completed her chemotherapy which is tentatively scheduled to end May 2024.  Today she is doing well.  She states that the drainage has almost completely ceased, improved since last visit.  She states that she does not experience pain, but she continues to endorse some tenderness with palpation.  This tenderness has also improved since earlier on in her postoperative recovery.  She continues to deny any systemic symptoms aside from those related to her chemotherapy.  On exam, the wound at the medial aspect of right breast has improved since last encounter.  The surrounding skin is looking healthier and there is no swelling or drainage noted on exam.  Her mastectomy incision appears well-healed.  At this point, can continue with gauze/calcium alginate as needed for any light drainage, but otherwise keep covered with simple Band-Aid.  Discussed case with Dr. Marla Roe and plan is for her to return to clinic to meet with her next month after she concludes her chemotherapy treatments.  At  that time, we will further discuss next steps including likely latissimus dorsi flap reconstruction.  She will call clinic should she have questions or concerns in the interim.  Picture(s) obtained of the patient and placed in the chart were with the patient's or guardian's permission.

## 2022-05-04 NOTE — Patient Instructions (Signed)
Filgrastim Injection What is this medication? FILGRASTIM (fil GRA stim) lowers the risk of infection in people who are receiving chemotherapy. It works by helping your body make more white blood cells, which protects your body from infection. It may also be used to help people who have been exposed to high doses of radiation. It can be used to help prepare your body before a stem cell transplant. It works by helping your bone marrow make and release stem cells into the blood. This medicine may be used for other purposes; ask your health care provider or pharmacist if you have questions. COMMON BRAND NAME(S): Neupogen, Nivestym, Releuko, Zarxio What should I tell my care team before I take this medication? They need to know if you have any of these conditions: History of blood diseases, such as sickle cell anemia Kidney disease Recent or ongoing radiation An unusual or allergic reaction to filgrastim, pegfilgrastim, latex, rubber, other medications, foods, dyes, or preservatives Pregnant or trying to get pregnant Breast-feeding How should I use this medication? This medication is injected under the skin or into a vein. It is usually given by your care team in a hospital or clinic setting. It may be given at home. If you get this medication at home, you will be taught how to prepare and give it. Use exactly as directed. Take it as directed on the prescription label at the same time every day. Keep taking it unless your care team tells you to stop. It is important that you put your used needles and syringes in a special sharps container. Do not put them in a trash can. If you do not have a sharps container, call your pharmacist or care team to get one. This medication comes with INSTRUCTIONS FOR USE. Ask your pharmacist for directions on how to use this medication. Read the information carefully. Talk to your pharmacist or care team if you have questions. Talk to your care team about the use of this  medication in children. While it may be prescribed for children for selected conditions, precautions do apply. Overdosage: If you think you have taken too much of this medicine contact a poison control center or emergency room at once. NOTE: This medicine is only for you. Do not share this medicine with others. What if I miss a dose? It is important not to miss any doses. Talk to your care team about what to do if you miss a dose. What may interact with this medication? Medications that may cause a release of neutrophils, such as lithium This list may not describe all possible interactions. Give your health care provider a list of all the medicines, herbs, non-prescription drugs, or dietary supplements you use. Also tell them if you smoke, drink alcohol, or use illegal drugs. Some items may interact with your medicine. What should I watch for while using this medication? Your condition will be monitored carefully while you are receiving this medication. You may need bloodwork while taking this medication. Talk to your care team about your risk of cancer. You may be more at risk for certain types of cancer if you take this medication. What side effects may I notice from receiving this medication? Side effects that you should report to your care team as soon as possible: Allergic reactions--skin rash, itching, hives, swelling of the face, lips, tongue, or throat Capillary leak syndrome--stomach or muscle pain, unusual weakness or fatigue, feeling faint or lightheaded, decrease in the amount of urine, swelling of the ankles, hands, or   feet, trouble breathing High white blood cell level--fever, fatigue, trouble breathing, night sweats, change in vision, weight loss Inflammation of the aorta--fever, fatigue, back, chest, or stomach pain, severe headache Kidney injury (glomerulonephritis)--decrease in the amount of urine, red or dark brown urine, foamy or bubbly urine, swelling of the ankles, hands, or  feet Shortness of breath or trouble breathing Spleen injury--pain in upper left stomach or shoulder Unusual bruising or bleeding Side effects that usually do not require medical attention (report to your care team if they continue or are bothersome): Back pain Bone pain Fatigue Fever Headache Nausea This list may not describe all possible side effects. Call your doctor for medical advice about side effects. You may report side effects to FDA at 1-800-FDA-1088. Where should I keep my medication? Keep out of the reach of children and pets. Keep this medication in the original packaging until you are ready to take it. Protect from light. See product for storage information. Each product may have different instructions. Get rid of any unused medication after the expiration date. To get rid of medications that are no longer needed or have expired: Take the medication to a medications take-back program. Check with your pharmacy or law enforcement to find a location. If you cannot return the medication, ask your pharmacist or care team how to get rid of this medication safely. NOTE: This sheet is a summary. It may not cover all possible information. If you have questions about this medicine, talk to your doctor, pharmacist, or health care provider.  2023 Elsevier/Gold Standard (2021-04-28 00:00:00)  

## 2022-05-04 NOTE — Telephone Encounter (Signed)
Reached out to patient to schedule per WQ, unable to reach patient.

## 2022-05-05 MED FILL — Dexamethasone Sodium Phosphate Inj 100 MG/10ML: INTRAMUSCULAR | Qty: 1 | Status: AC

## 2022-05-06 ENCOUNTER — Other Ambulatory Visit: Payer: Self-pay

## 2022-05-06 ENCOUNTER — Inpatient Hospital Stay (HOSPITAL_BASED_OUTPATIENT_CLINIC_OR_DEPARTMENT_OTHER): Payer: Commercial Managed Care - HMO | Admitting: Hematology and Oncology

## 2022-05-06 ENCOUNTER — Inpatient Hospital Stay: Payer: Commercial Managed Care - HMO

## 2022-05-06 VITALS — BP 125/90 | HR 87 | Temp 98.3°F | Resp 18

## 2022-05-06 VITALS — BP 131/84 | HR 79 | Temp 97.3°F | Resp 18 | Ht 62.0 in | Wt 116.0 lb

## 2022-05-06 DIAGNOSIS — Z171 Estrogen receptor negative status [ER-]: Secondary | ICD-10-CM | POA: Diagnosis not present

## 2022-05-06 DIAGNOSIS — Z95828 Presence of other vascular implants and grafts: Secondary | ICD-10-CM

## 2022-05-06 DIAGNOSIS — C50512 Malignant neoplasm of lower-outer quadrant of left female breast: Secondary | ICD-10-CM | POA: Diagnosis not present

## 2022-05-06 LAB — CBC WITH DIFFERENTIAL (CANCER CENTER ONLY)
Abs Immature Granulocytes: 0.06 10*3/uL (ref 0.00–0.07)
Basophils Absolute: 0.1 10*3/uL (ref 0.0–0.1)
Basophils Relative: 1 %
Eosinophils Absolute: 0.1 10*3/uL (ref 0.0–0.5)
Eosinophils Relative: 1 %
HCT: 33.6 % — ABNORMAL LOW (ref 36.0–46.0)
Hemoglobin: 11.3 g/dL — ABNORMAL LOW (ref 12.0–15.0)
Immature Granulocytes: 1 %
Lymphocytes Relative: 17 %
Lymphs Abs: 1.4 10*3/uL (ref 0.7–4.0)
MCH: 32.8 pg (ref 26.0–34.0)
MCHC: 33.6 g/dL (ref 30.0–36.0)
MCV: 97.4 fL (ref 80.0–100.0)
Monocytes Absolute: 0.3 10*3/uL (ref 0.1–1.0)
Monocytes Relative: 4 %
Neutro Abs: 6.4 10*3/uL (ref 1.7–7.7)
Neutrophils Relative %: 76 %
Platelet Count: 274 10*3/uL (ref 150–400)
RBC: 3.45 MIL/uL — ABNORMAL LOW (ref 3.87–5.11)
RDW: 14.8 % (ref 11.5–15.5)
WBC Count: 8.3 10*3/uL (ref 4.0–10.5)
nRBC: 0 % (ref 0.0–0.2)

## 2022-05-06 LAB — CMP (CANCER CENTER ONLY)
ALT: 12 U/L (ref 0–44)
AST: 17 U/L (ref 15–41)
Albumin: 4.2 g/dL (ref 3.5–5.0)
Alkaline Phosphatase: 71 U/L (ref 38–126)
Anion gap: 7 (ref 5–15)
BUN: 8 mg/dL (ref 6–20)
CO2: 27 mmol/L (ref 22–32)
Calcium: 9.9 mg/dL (ref 8.9–10.3)
Chloride: 105 mmol/L (ref 98–111)
Creatinine: 0.5 mg/dL (ref 0.44–1.00)
GFR, Estimated: >60 mL/min (ref >60)
Glucose, Bld: 94 mg/dL (ref 70–99)
Potassium: 3.7 mmol/L (ref 3.5–5.1)
Sodium: 139 mmol/L (ref 135–145)
Total Bilirubin: 0.3 mg/dL (ref 0.3–1.2)
Total Protein: 7 g/dL (ref 6.5–8.1)

## 2022-05-06 MED ORDER — DIPHENHYDRAMINE HCL 50 MG/ML IJ SOLN
25.0000 mg | Freq: Once | INTRAMUSCULAR | Status: AC
  Administered 2022-05-06: 25 mg via INTRAVENOUS
  Filled 2022-05-06: qty 1

## 2022-05-06 MED ORDER — SODIUM CHLORIDE 0.9 % IV SOLN
10.0000 mg | Freq: Once | INTRAVENOUS | Status: AC
  Administered 2022-05-06: 10 mg via INTRAVENOUS
  Filled 2022-05-06: qty 10

## 2022-05-06 MED ORDER — FAMOTIDINE IN NACL 20-0.9 MG/50ML-% IV SOLN
20.0000 mg | Freq: Once | INTRAVENOUS | Status: AC
  Administered 2022-05-06: 20 mg via INTRAVENOUS
  Filled 2022-05-06: qty 50

## 2022-05-06 MED ORDER — SODIUM CHLORIDE 0.9 % IV SOLN: Freq: Once | INTRAVENOUS | Status: AC

## 2022-05-06 MED ORDER — PALONOSETRON HCL INJECTION 0.25 MG/5ML
0.2500 mg | Freq: Once | INTRAVENOUS | Status: AC
  Administered 2022-05-06: 0.25 mg via INTRAVENOUS
  Filled 2022-05-06: qty 5

## 2022-05-06 MED ORDER — SODIUM CHLORIDE 0.9% FLUSH
10.0000 mL | INTRAVENOUS | Status: DC | PRN
  Administered 2022-05-06: 10 mL

## 2022-05-06 MED ORDER — SODIUM CHLORIDE 0.9% FLUSH
10.0000 mL | Freq: Once | INTRAVENOUS | Status: AC
  Administered 2022-05-06: 10 mL

## 2022-05-06 MED ORDER — SODIUM CHLORIDE 0.9 % IV SOLN
65.0000 mg/m2 | Freq: Once | INTRAVENOUS | Status: AC
  Administered 2022-05-06: 96 mg via INTRAVENOUS
  Filled 2022-05-06: qty 16

## 2022-05-06 MED ORDER — HEPARIN SOD (PORK) LOCK FLUSH 100 UNIT/ML IV SOLN
500.0000 [IU] | Freq: Once | INTRAVENOUS | Status: AC | PRN
  Administered 2022-05-06: 500 [IU]

## 2022-05-06 NOTE — Assessment & Plan Note (Signed)
09/07/2021:Screening mammogram detected left breast asymmetry posteriorly.  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, margins negative, 0/5 sentinel lymph nodes negative, ER 0%, PR 0%, HER2 negative, Ki-67 70%   Treatment plan: Adjuvant chemotherapy with Adriamycin and Cytoxan followed by Taxol started 12/03/2021 No role of adjuvant radiation or antiestrogen therapy --------------------------------------------------------------------------------------------------------------------------------------------------------------------- Current treatment: Completed 4 cycles of dose dense Adriamycin and Cytoxan, today cycle 8 Taxol   Chemotoxicities: Nausea: Severe for 2 to 3 days after chemo  fatigue Chemotherapy-induced anemia: Today's hemoglobin is 11.6 Pain during the last 15 minutes of chemotherapy Body aches and pains at home: Improved with Claritin Neutropenia: Resolved with Neupogen injection given couple of days before each chemo.   She will need a oophorectomy once chemotherapy is completed.   Return to clinic in weekly for Taxol treatments.

## 2022-05-06 NOTE — Patient Instructions (Signed)
[facility]  Discharge Instructions: Thank you for choosing [facility] to provide your oncology and hematology care.   If you have a lab appointment with the Cancer Center, please go directly to the Cancer Center and check in at the registration area.   Wear comfortable clothing and clothing appropriate for easy access to any Portacath or PICC line.   We strive to give you quality time with your provider. You may need to reschedule your appointment if you arrive late (15 or more minutes).  Arriving late affects you and other patients whose appointments are after yours.  Also, if you miss three or more appointments without notifying the office, you may be dismissed from the clinic at the provider's discretion.      For prescription refill requests, have your pharmacy contact our office and allow 72 hours for refills to be completed.    Today you received the following chemotherapy and/or immunotherapy agents: paclitaxel      To help prevent nausea and vomiting after your treatment, we encourage you to take your nausea medication as directed.  BELOW ARE SYMPTOMS THAT SHOULD BE REPORTED IMMEDIATELY: *FEVER GREATER THAN 100.4 F (38 C) OR HIGHER *CHILLS OR SWEATING *NAUSEA AND VOMITING THAT IS NOT CONTROLLED WITH YOUR NAUSEA MEDICATION *UNUSUAL SHORTNESS OF BREATH *UNUSUAL BRUISING OR BLEEDING *URINARY PROBLEMS (pain or burning when urinating, or frequent urination) *BOWEL PROBLEMS (unusual diarrhea, constipation, pain near the anus) TENDERNESS IN MOUTH AND THROAT WITH OR WITHOUT PRESENCE OF ULCERS (sore throat, sores in mouth, or a toothache) UNUSUAL RASH, SWELLING OR PAIN  UNUSUAL VAGINAL DISCHARGE OR ITCHING   Items with * indicate a potential emergency and should be followed up as soon as possible or go to the Emergency Department if any problems should occur.  Please show the CHEMOTHERAPY ALERT CARD or IMMUNOTHERAPY ALERT CARD at  check-in to the Emergency Department and triage nurse.  Should you have questions after your visit or need to cancel or reschedule your appointment, please contact [facility]  Dept: [phone]  and follow the prompts.  Office hours are 8:00 a.m. to 4:30 p.m. Monday - Friday. Please note that voicemails left after 4:00 p.m. may not be returned until the following business day.  We are closed weekends and major holidays. You have access to a nurse at all times for urgent questions. Please call the main number to the clinic Dept: [phone] and follow the prompts.   For any non-urgent questions, you may also contact your provider using MyChart. We now offer e-Visits for anyone 18 and older to request care online for non-urgent symptoms. For details visit [email or web address].   Also download the MyChart app! Go to the app store, search "MyChart", open the app, select [facility], and log in with your MyChart username and password.   

## 2022-05-11 ENCOUNTER — Other Ambulatory Visit: Payer: Self-pay

## 2022-05-11 ENCOUNTER — Inpatient Hospital Stay: Payer: Commercial Managed Care - HMO

## 2022-05-11 VITALS — BP 110/84 | HR 83 | Temp 98.9°F | Resp 18

## 2022-05-11 DIAGNOSIS — C50512 Malignant neoplasm of lower-outer quadrant of left female breast: Secondary | ICD-10-CM | POA: Diagnosis not present

## 2022-05-11 DIAGNOSIS — Z171 Estrogen receptor negative status [ER-]: Secondary | ICD-10-CM

## 2022-05-11 MED ORDER — FILGRASTIM-AAFI 300 MCG/0.5ML IJ SOSY
300.0000 ug | PREFILLED_SYRINGE | Freq: Once | INTRAMUSCULAR | Status: AC
  Administered 2022-05-11: 300 ug via SUBCUTANEOUS
  Filled 2022-05-11: qty 0.5

## 2022-05-12 MED FILL — Dexamethasone Sodium Phosphate Inj 100 MG/10ML: INTRAMUSCULAR | Qty: 1 | Status: AC

## 2022-05-13 ENCOUNTER — Inpatient Hospital Stay: Payer: Commercial Managed Care - HMO | Admitting: Hematology and Oncology

## 2022-05-13 ENCOUNTER — Inpatient Hospital Stay: Payer: Commercial Managed Care - HMO

## 2022-05-13 ENCOUNTER — Other Ambulatory Visit: Payer: Self-pay | Admitting: *Deleted

## 2022-05-13 VITALS — BP 112/84 | HR 79 | Temp 98.1°F | Resp 18 | Wt 117.0 lb

## 2022-05-13 DIAGNOSIS — C50512 Malignant neoplasm of lower-outer quadrant of left female breast: Secondary | ICD-10-CM | POA: Diagnosis not present

## 2022-05-13 DIAGNOSIS — Z95828 Presence of other vascular implants and grafts: Secondary | ICD-10-CM

## 2022-05-13 LAB — CBC WITH DIFFERENTIAL (CANCER CENTER ONLY)
Abs Immature Granulocytes: 0.17 10*3/uL — ABNORMAL HIGH (ref 0.00–0.07)
Basophils Absolute: 0.1 10*3/uL (ref 0.0–0.1)
Basophils Relative: 1 %
Eosinophils Absolute: 0.1 10*3/uL (ref 0.0–0.5)
Eosinophils Relative: 1 %
HCT: 33.6 % — ABNORMAL LOW (ref 36.0–46.0)
Hemoglobin: 11.3 g/dL — ABNORMAL LOW (ref 12.0–15.0)
Immature Granulocytes: 2 %
Lymphocytes Relative: 16 %
Lymphs Abs: 1.7 10*3/uL (ref 0.7–4.0)
MCH: 32.5 pg (ref 26.0–34.0)
MCHC: 33.6 g/dL (ref 30.0–36.0)
MCV: 96.6 fL (ref 80.0–100.0)
Monocytes Absolute: 0.4 10*3/uL (ref 0.1–1.0)
Monocytes Relative: 4 %
Neutro Abs: 7.9 10*3/uL — ABNORMAL HIGH (ref 1.7–7.7)
Neutrophils Relative %: 76 %
Platelet Count: 284 10*3/uL (ref 150–400)
RBC: 3.48 MIL/uL — ABNORMAL LOW (ref 3.87–5.11)
RDW: 15.1 % (ref 11.5–15.5)
WBC Count: 10.4 10*3/uL (ref 4.0–10.5)
nRBC: 0 % (ref 0.0–0.2)

## 2022-05-13 LAB — CMP (CANCER CENTER ONLY)
ALT: 11 U/L (ref 0–44)
AST: 14 U/L — ABNORMAL LOW (ref 15–41)
Albumin: 4.2 g/dL (ref 3.5–5.0)
Alkaline Phosphatase: 73 U/L (ref 38–126)
Anion gap: 6 (ref 5–15)
BUN: 11 mg/dL (ref 6–20)
CO2: 27 mmol/L (ref 22–32)
Calcium: 9.5 mg/dL (ref 8.9–10.3)
Chloride: 107 mmol/L (ref 98–111)
Creatinine: 0.66 mg/dL (ref 0.44–1.00)
GFR, Estimated: >60 mL/min (ref >60)
Glucose, Bld: 97 mg/dL (ref 70–99)
Potassium: 4.1 mmol/L (ref 3.5–5.1)
Sodium: 140 mmol/L (ref 135–145)
Total Bilirubin: 0.3 mg/dL (ref 0.3–1.2)
Total Protein: 6.8 g/dL (ref 6.5–8.1)

## 2022-05-13 MED ORDER — SODIUM CHLORIDE 0.9 % IV SOLN
10.0000 mg | Freq: Once | INTRAVENOUS | Status: AC
  Administered 2022-05-13: 10 mg via INTRAVENOUS
  Filled 2022-05-13: qty 10

## 2022-05-13 MED ORDER — SODIUM CHLORIDE 0.9 % IV SOLN
65.0000 mg/m2 | Freq: Once | INTRAVENOUS | Status: AC
  Administered 2022-05-13: 96 mg via INTRAVENOUS
  Filled 2022-05-13: qty 16

## 2022-05-13 MED ORDER — FAMOTIDINE IN NACL 20-0.9 MG/50ML-% IV SOLN
20.0000 mg | Freq: Once | INTRAVENOUS | Status: AC
  Administered 2022-05-13: 20 mg via INTRAVENOUS
  Filled 2022-05-13: qty 50

## 2022-05-13 MED ORDER — AMOXICILLIN 500 MG PO TABS: 500.0000 mg | ORAL_TABLET | Freq: Two times a day (BID) | ORAL | 0 refills | Status: DC

## 2022-05-13 MED ORDER — HEPARIN SOD (PORK) LOCK FLUSH 100 UNIT/ML IV SOLN
500.0000 [IU] | Freq: Once | INTRAVENOUS | Status: AC | PRN
  Administered 2022-05-13: 500 [IU]

## 2022-05-13 MED ORDER — DIPHENHYDRAMINE HCL 50 MG/ML IJ SOLN
25.0000 mg | Freq: Once | INTRAMUSCULAR | Status: AC
  Administered 2022-05-13: 25 mg via INTRAVENOUS
  Filled 2022-05-13: qty 1

## 2022-05-13 MED ORDER — SODIUM CHLORIDE 0.9% FLUSH
10.0000 mL | Freq: Once | INTRAVENOUS | Status: AC
  Administered 2022-05-13: 10 mL

## 2022-05-13 MED ORDER — SODIUM CHLORIDE 0.9% FLUSH
10.0000 mL | INTRAVENOUS | Status: DC | PRN
  Administered 2022-05-13: 10 mL

## 2022-05-13 MED ORDER — SODIUM CHLORIDE 0.9 % IV SOLN: Freq: Once | INTRAVENOUS | Status: AC

## 2022-05-13 MED ORDER — PALONOSETRON HCL INJECTION 0.25 MG/5ML
0.2500 mg | Freq: Once | INTRAVENOUS | Status: AC
  Administered 2022-05-13: 0.25 mg via INTRAVENOUS
  Filled 2022-05-13: qty 5

## 2022-05-13 NOTE — Patient Instructions (Signed)
[facility]  Discharge Instructions: Thank you for choosing [facility] to provide your oncology and hematology care.   If you have a lab appointment with the Cancer Center, please go directly to the Cancer Center and check in at the registration area.   Wear comfortable clothing and clothing appropriate for easy access to any Portacath or PICC line.   We strive to give you quality time with your provider. You may need to reschedule your appointment if you arrive late (15 or more minutes).  Arriving late affects you and other patients whose appointments are after yours.  Also, if you miss three or more appointments without notifying the office, you may be dismissed from the clinic at the provider's discretion.      For prescription refill requests, have your pharmacy contact our office and allow 72 hours for refills to be completed.    Today you received the following chemotherapy and/or immunotherapy agents Paclitaxel      To help prevent nausea and vomiting after your treatment, we encourage you to take your nausea medication as directed.  BELOW ARE SYMPTOMS THAT SHOULD BE REPORTED IMMEDIATELY: *FEVER GREATER THAN 100.4 F (38 C) OR HIGHER *CHILLS OR SWEATING *NAUSEA AND VOMITING THAT IS NOT CONTROLLED WITH YOUR NAUSEA MEDICATION *UNUSUAL SHORTNESS OF BREATH *UNUSUAL BRUISING OR BLEEDING *URINARY PROBLEMS (pain or burning when urinating, or frequent urination) *BOWEL PROBLEMS (unusual diarrhea, constipation, pain near the anus) TENDERNESS IN MOUTH AND THROAT WITH OR WITHOUT PRESENCE OF ULCERS (sore throat, sores in mouth, or a toothache) UNUSUAL RASH, SWELLING OR PAIN  UNUSUAL VAGINAL DISCHARGE OR ITCHING   Items with * indicate a potential emergency and should be followed up as soon as possible or go to the Emergency Department if any problems should occur.  Please show the CHEMOTHERAPY ALERT CARD or IMMUNOTHERAPY ALERT CARD at  check-in to the Emergency Department and triage nurse.  Should you have questions after your visit or need to cancel or reschedule your appointment, please contact [facility]  Dept: [phone]  and follow the prompts.  Office hours are 8:00 a.m. to 4:30 p.m. Monday - Friday. Please note that voicemails left after 4:00 p.m. may not be returned until the following business day.  We are closed weekends and major holidays. You have access to a nurse at all times for urgent questions. Please call the main number to the clinic Dept: [phone] and follow the prompts.   For any non-urgent questions, you may also contact your provider using MyChart. We now offer e-Visits for anyone 18 and older to request care online for non-urgent symptoms. For details visit [email or web address].   Also download the MyChart app! Go to the app store, search "MyChart", open the app, select [facility], and log in with your MyChart username and password.   

## 2022-05-13 NOTE — Assessment & Plan Note (Deleted)
09/07/2021:Screening mammogram detected left breast asymmetry posteriorly.  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, margins negative, 0/5 sentinel lymph nodes negative, ER 0%, PR 0%, HER2 negative, Ki-67 70%   Treatment plan: Adjuvant chemotherapy with Adriamycin and Cytoxan followed by Taxol started 12/03/2021 No role of adjuvant radiation or antiestrogen therapy --------------------------------------------------------------------------------------------------------------------------------------------------------------------- Current treatment: Completed 4 cycles of dose dense Adriamycin and Cytoxan, today cycle 9 Taxol   Chemotoxicities: Nausea:  fatigue Chemotherapy-induced anemia: Today's hemoglobin is 11.3 Body aches and pains at home: Improved with Claritin Neutropenia: Resolved with Neupogen injection given couple of days before each chemo. 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   She will need a oophorectomy once chemotherapy is completed.   Return to clinic in weekly for Taxol treatments.

## 2022-05-18 ENCOUNTER — Inpatient Hospital Stay: Payer: Commercial Managed Care - HMO

## 2022-05-18 ENCOUNTER — Other Ambulatory Visit: Payer: Self-pay

## 2022-05-18 VITALS — BP 110/85 | HR 82 | Temp 98.5°F | Resp 16

## 2022-05-18 DIAGNOSIS — C50512 Malignant neoplasm of lower-outer quadrant of left female breast: Secondary | ICD-10-CM | POA: Diagnosis not present

## 2022-05-18 DIAGNOSIS — Z171 Estrogen receptor negative status [ER-]: Secondary | ICD-10-CM

## 2022-05-18 MED ORDER — FILGRASTIM-AAFI 300 MCG/0.5ML IJ SOSY
300.0000 ug | PREFILLED_SYRINGE | Freq: Once | INTRAMUSCULAR | Status: AC
  Administered 2022-05-18: 300 ug via SUBCUTANEOUS
  Filled 2022-05-18: qty 0.5

## 2022-05-18 NOTE — Progress Notes (Signed)
Patient Care Team: Pcp, No as PCP - General Donnelly Angelica, RN as Oncology Nurse Navigator Pershing Proud, RN as Oncology Nurse Navigator Abigail Miyamoto, MD as Consulting Physician (General Surgery) Serena Croissant, MD as Consulting Physician (Hematology and Oncology) Dorothy Puffer, MD as Consulting Physician (Radiation Oncology)  DIAGNOSIS: No diagnosis found.  SUMMARY OF ONCOLOGIC HISTORY: Oncology History  Malignant neoplasm of lower-outer quadrant of left breast of female, estrogen receptor negative  09/07/2021 Initial Diagnosis   Screening mammogram detected left breast asymmetry posteriorly.  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  Report date is September 26, 2021 (BRCAPlus) and September 29, 2021 (expanded).    The CancerNext-Expanded gene panel offered by [facility] and includes sequencing, rearrangement, and RNA analysis for the following 77 genes: AIP, ALK, APC, ATM, AXIN2, BAP1, BARD1, BLM, BMPR1A, BRCA1, BRCA2, BRIP1, CDC73, CDH1, CDK4, CDKN1B, CDKN2A, CHEK2, CTNNA1, DICER1, FANCC, FH, FLCN, GALNT12, KIF1B, LZTR1, MAX, MEN1, MET, MLH1, MSH2, MSH3, MSH6, MUTYH, NBN, NF1, NF2, NTHL1, PALB2, PHOX2B, PMS2, POT1, PRKAR1A, PTCH1, PTEN, RAD51C, RAD51D, RB1, RECQL, RET, SDHA, SDHAF2, SDHB, SDHC, SDHD, SMAD4, SMARCA4, SMARCB1, SMARCE1, STK11, SUFU, TMEM127, TP53, TSC1, TSC2, VHL and XRCC2 (sequencing and deletion/duplication); EGFR, EGLN1, HOXB13, KIT, MITF, PDGFRA, POLD1, and POLE (sequencing  only); EPCAM and GREM1 (deletion/duplication only).    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  MEDICATIONS:  Current Outpatient Medications  Medication Sig Dispense Refill   acetaminophen (TYLENOL) 500 MG tablet Take 500-1,000 mg by mouth every 6 (six) hours as needed (pain.).     amoxicillin (AMOXIL) 500 MG tablet Take 1 tablet (500 mg total) by mouth 2 (two) times daily. 10 tablet 0   diazepam (VALIUM) 2 MG tablet Take 1 tablet (2 mg total) by mouth every 12 (twelve) hours as needed for up to 20 doses for muscle spasms. 20 tablet 0   FILGRASTIM IJ Inject as directed once a week.     Galcanezumab-gnlm (EMGALITY) 120 MG/ML SOAJ Inject 120 mg into the skin every 28 (twenty-eight) days. 1 mL 1   nortriptyline (PAMELOR) 10 MG capsule Take 1 capsule (10 mg total) by mouth at bedtime. 30 capsule 5   ondansetron (ZOFRAN) 4 MG tablet Take 1 tablet (4 mg total) by mouth every 8 (eight) hours as needed for up to 20 doses for nausea or vomiting. 20 tablet 0   SUMAtriptan (IMITREX) 50 MG tablet TAKE 1 TAB BY MOUTH AS NEEDED FOR MIGRAINE. MAX 2 TABS IN 24 HOURS. MAY REPEAT IN 2 HOURS IF HEADACHE PERSISTS OR RECURS. 30 tablet 1   UBRELVY 100  MG TABS TAKE AS NEEDED (TAKE 1 AT THE EARLIST ONSET OF A MIGRAINE. MAY REPEAT IN 2 HOURS. MAX 2/24 HOURS). 30 tablet 1   Zavegepant HCl (ZAVZPRET) 10 MG/ACT SOLN Place 10 mg into the nose daily as needed. 6 each 11   No current facility-administered medications for this visit.    PHYSICAL EXAMINATION: ECOG PERFORMANCE STATUS: {CHL ONC ECOG PS:[phone]}  There  were no vitals filed for this visit. There were no vitals filed for this visit.  BREAST:*** No palpable masses or nodules in either right or left breasts. No palpable axillary supraclavicular or infraclavicular adenopathy no breast tenderness or nipple discharge. (exam performed in the presence of a chaperone)  LABORATORY DATA:  I have reviewed the data as listed    Latest Ref Rng & Units 05/13/2022   10:05 AM 05/06/2022   11:40 AM 04/29/2022   12:21 PM  CMP  Glucose 70 - 99 mg/dL 97  94  88   BUN 6 - 20 mg/dL 11  8  12    Creatinine 0.44 - 1.00 mg/dL 9.16  3.84  6.65   Sodium 135 - 145 mmol/L 140  139  140   Potassium 3.5 - 5.1 mmol/L 4.1  3.7  3.9   Chloride 98 - 111 mmol/L 107  105  104   CO2 22 - 32 mmol/L 27  27  30    Calcium 8.9 - 10.3 mg/dL 9.5  9.9  9.7   Total Protein 6.5 - 8.1 g/dL 6.8  7.0  7.1   Total Bilirubin 0.3 - 1.2 mg/dL 0.3  0.3  0.3   Alkaline Phos 38 - 126 U/L 73  71  79   AST 15 - 41 U/L 14  17  17    ALT 0 - 44 U/L 11  12  13      Lab Results  Component Value Date   WBC 10.4 05/13/2022   HGB 11.3 (L) 05/13/2022   HCT 33.6 (L) 05/13/2022   MCV 96.6 05/13/2022   PLT 284 05/13/2022   NEUTROABS 7.9 (H) 05/13/2022    ASSESSMENT & PLAN:  No problem-specific Assessment & Plan notes found for this encounter.    No orders of the defined types were placed in this encounter.  The patient has a good understanding of the overall plan. she agrees with it. she will call with any problems that may develop before the next visit here. Total time spent: 30 mins including face to face time and time spent for planning, charting and co-ordination of care   Sherlyn Lick, CMA 05/18/22    I Janan Ridge am acting as a Neurosurgeon for [employer]  ***

## 2022-05-19 MED FILL — Dexamethasone Sodium Phosphate Inj 100 MG/10ML: INTRAMUSCULAR | Qty: 1 | Status: AC

## 2022-05-20 ENCOUNTER — Inpatient Hospital Stay: Payer: Commercial Managed Care - HMO

## 2022-05-20 ENCOUNTER — Telehealth: Payer: Self-pay | Admitting: *Deleted

## 2022-05-20 ENCOUNTER — Encounter: Payer: Self-pay | Admitting: *Deleted

## 2022-05-20 ENCOUNTER — Inpatient Hospital Stay (HOSPITAL_BASED_OUTPATIENT_CLINIC_OR_DEPARTMENT_OTHER): Payer: Commercial Managed Care - HMO | Admitting: Hematology and Oncology

## 2022-05-20 ENCOUNTER — Other Ambulatory Visit: Payer: Self-pay

## 2022-05-20 VITALS — BP 132/90 | HR 85 | Temp 97.3°F | Resp 18 | Ht 62.0 in | Wt 118.6 lb

## 2022-05-20 VITALS — BP 131/91 | HR 90 | Temp 98.6°F | Resp 16

## 2022-05-20 DIAGNOSIS — Z1509 Genetic susceptibility to other malignant neoplasm: Secondary | ICD-10-CM

## 2022-05-20 DIAGNOSIS — Z171 Estrogen receptor negative status [ER-]: Secondary | ICD-10-CM

## 2022-05-20 DIAGNOSIS — C50512 Malignant neoplasm of lower-outer quadrant of left female breast: Secondary | ICD-10-CM

## 2022-05-20 DIAGNOSIS — Z1501 Genetic susceptibility to malignant neoplasm of breast: Secondary | ICD-10-CM

## 2022-05-20 DIAGNOSIS — Z95828 Presence of other vascular implants and grafts: Secondary | ICD-10-CM

## 2022-05-20 LAB — CBC WITH DIFFERENTIAL (CANCER CENTER ONLY)
Abs Immature Granulocytes: 0.14 10*3/uL — ABNORMAL HIGH (ref 0.00–0.07)
Basophils Absolute: 0.1 10*3/uL (ref 0.0–0.1)
Basophils Relative: 1 %
Eosinophils Absolute: 0.2 10*3/uL (ref 0.0–0.5)
Eosinophils Relative: 2 %
HCT: 32.5 % — ABNORMAL LOW (ref 36.0–46.0)
Hemoglobin: 10.9 g/dL — ABNORMAL LOW (ref 12.0–15.0)
Immature Granulocytes: 2 %
Lymphocytes Relative: 18 %
Lymphs Abs: 1.6 10*3/uL (ref 0.7–4.0)
MCH: 32.8 pg (ref 26.0–34.0)
MCHC: 33.5 g/dL (ref 30.0–36.0)
MCV: 97.9 fL (ref 80.0–100.0)
Monocytes Absolute: 0.4 10*3/uL (ref 0.1–1.0)
Monocytes Relative: 5 %
Neutro Abs: 6.4 10*3/uL (ref 1.7–7.7)
Neutrophils Relative %: 72 %
Platelet Count: 266 10*3/uL (ref 150–400)
RBC: 3.32 MIL/uL — ABNORMAL LOW (ref 3.87–5.11)
RDW: 15.1 % (ref 11.5–15.5)
WBC Count: 8.8 10*3/uL (ref 4.0–10.5)
nRBC: 0 % (ref 0.0–0.2)

## 2022-05-20 LAB — CMP (CANCER CENTER ONLY)
ALT: 11 U/L (ref 0–44)
AST: 15 U/L (ref 15–41)
Albumin: 3.9 g/dL (ref 3.5–5.0)
Alkaline Phosphatase: 67 U/L (ref 38–126)
Anion gap: 5 (ref 5–15)
BUN: 11 mg/dL (ref 6–20)
CO2: 26 mmol/L (ref 22–32)
Calcium: 9.3 mg/dL (ref 8.9–10.3)
Chloride: 108 mmol/L (ref 98–111)
Creatinine: 0.52 mg/dL (ref 0.44–1.00)
GFR, Estimated: >60 mL/min (ref >60)
Glucose, Bld: 99 mg/dL (ref 70–99)
Potassium: 4 mmol/L (ref 3.5–5.1)
Sodium: 139 mmol/L (ref 135–145)
Total Bilirubin: 0.2 mg/dL — ABNORMAL LOW (ref 0.3–1.2)
Total Protein: 6.5 g/dL (ref 6.5–8.1)

## 2022-05-20 MED ORDER — SODIUM CHLORIDE 0.9% FLUSH
10.0000 mL | INTRAVENOUS | Status: DC | PRN
  Administered 2022-05-20: 10 mL

## 2022-05-20 MED ORDER — SODIUM CHLORIDE 0.9% FLUSH
10.0000 mL | Freq: Once | INTRAVENOUS | Status: AC
  Administered 2022-05-20: 10 mL

## 2022-05-20 MED ORDER — SODIUM CHLORIDE 0.9 % IV SOLN: Freq: Once | INTRAVENOUS | Status: AC

## 2022-05-20 MED ORDER — FAMOTIDINE IN NACL 20-0.9 MG/50ML-% IV SOLN
20.0000 mg | Freq: Once | INTRAVENOUS | Status: AC
  Administered 2022-05-20: 20 mg via INTRAVENOUS
  Filled 2022-05-20: qty 50

## 2022-05-20 MED ORDER — PALONOSETRON HCL INJECTION 0.25 MG/5ML
0.2500 mg | Freq: Once | INTRAVENOUS | Status: AC
  Administered 2022-05-20: 0.25 mg via INTRAVENOUS
  Filled 2022-05-20: qty 5

## 2022-05-20 MED ORDER — HEPARIN SOD (PORK) LOCK FLUSH 100 UNIT/ML IV SOLN
500.0000 [IU] | Freq: Once | INTRAVENOUS | Status: AC | PRN
  Administered 2022-05-20: 500 [IU]

## 2022-05-20 MED ORDER — SODIUM CHLORIDE 0.9 % IV SOLN
10.0000 mg | Freq: Once | INTRAVENOUS | Status: AC
  Administered 2022-05-20: 10 mg via INTRAVENOUS
  Filled 2022-05-20: qty 10

## 2022-05-20 MED ORDER — SODIUM CHLORIDE 0.9 % IV SOLN
65.0000 mg/m2 | Freq: Once | INTRAVENOUS | Status: AC
  Administered 2022-05-20: 96 mg via INTRAVENOUS
  Filled 2022-05-20: qty 16

## 2022-05-20 MED ORDER — DIPHENHYDRAMINE HCL 50 MG/ML IJ SOLN
25.0000 mg | Freq: Once | INTRAMUSCULAR | Status: AC
  Administered 2022-05-20: 25 mg via INTRAVENOUS
  Filled 2022-05-20: qty 1

## 2022-05-20 NOTE — Patient Instructions (Signed)
[facility]  Discharge Instructions: Thank you for choosing [facility] to provide your oncology and hematology care.   If you have a lab appointment with the Cancer Center, please go directly to the Cancer Center and check in at the registration area.   Wear comfortable clothing and clothing appropriate for easy access to any Portacath or PICC line.   We strive to give you quality time with your provider. You may need to reschedule your appointment if you arrive late (15 or more minutes).  Arriving late affects you and other patients whose appointments are after yours.  Also, if you miss three or more appointments without notifying the office, you may be dismissed from the clinic at the provider's discretion.      For prescription refill requests, have your pharmacy contact our office and allow 72 hours for refills to be completed.    Today you received the following chemotherapy and/or immunotherapy agents Paclitaxel      To help prevent nausea and vomiting after your treatment, we encourage you to take your nausea medication as directed.  BELOW ARE SYMPTOMS THAT SHOULD BE REPORTED IMMEDIATELY: *FEVER GREATER THAN 100.4 F (38 C) OR HIGHER *CHILLS OR SWEATING *NAUSEA AND VOMITING THAT IS NOT CONTROLLED WITH YOUR NAUSEA MEDICATION *UNUSUAL SHORTNESS OF BREATH *UNUSUAL BRUISING OR BLEEDING *URINARY PROBLEMS (pain or burning when urinating, or frequent urination) *BOWEL PROBLEMS (unusual diarrhea, constipation, pain near the anus) TENDERNESS IN MOUTH AND THROAT WITH OR WITHOUT PRESENCE OF ULCERS (sore throat, sores in mouth, or a toothache) UNUSUAL RASH, SWELLING OR PAIN  UNUSUAL VAGINAL DISCHARGE OR ITCHING   Items with * indicate a potential emergency and should be followed up as soon as possible or go to the Emergency Department if any problems should occur.  Please show the CHEMOTHERAPY ALERT CARD or IMMUNOTHERAPY ALERT CARD at  check-in to the Emergency Department and triage nurse.  Should you have questions after your visit or need to cancel or reschedule your appointment, please contact [facility]  Dept: [phone]  and follow the prompts.  Office hours are 8:00 a.m. to 4:30 p.m. Monday - Friday. Please note that voicemails left after 4:00 p.m. may not be returned until the following business day.  We are closed weekends and major holidays. You have access to a nurse at all times for urgent questions. Please call the main number to the clinic Dept: [phone] and follow the prompts.   For any non-urgent questions, you may also contact your provider using MyChart. We now offer e-Visits for anyone 18 and older to request care online for non-urgent symptoms. For details visit [email or web address].   Also download the MyChart app! Go to the app store, search "MyChart", open the app, select [facility], and log in with your MyChart username and password.   

## 2022-05-20 NOTE — Telephone Encounter (Signed)
Spoke with the patient regarding the referral to GYN oncology. Patient scheduled as new patient with Dr Pricilla Holm on 5/17 at 10:30 am.  Patient given an arrival time of 10 am.  Explained to the patient the the doctor will perform a pelvic exam at this visit. Patient given the policy that only one visitor allowed and that visitor must be over 16 yrs are allowed in the Cancer Center. Patient given the address/phone number for the clinic and that the center offers free valet service. Patient aware that masks are option.

## 2022-05-20 NOTE — Assessment & Plan Note (Signed)
09/07/2021:Screening mammogram detected left breast asymmetry posteriorly.  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, margins negative, 0/5 sentinel lymph nodes negative, ER 0%, PR 0%, HER2 negative, Ki-67 70%   Treatment plan: Adjuvant chemotherapy with Adriamycin and Cytoxan followed by Taxol started 12/03/2021 No role of adjuvant radiation or antiestrogen therapy --------------------------------------------------------------------------------------------------------------------------------------------------------------------- Current treatment: Completed 4 cycles of dose dense Adriamycin and Cytoxan, today cycle 10 Taxol   Chemotoxicities: Nausea:  fatigue Chemotherapy-induced anemia: Today's hemoglobin is 11.3 Body aches and pains at home: Improved with Claritin Neutropenia: Resolved with Neupogen injection given couple of days before each chemo. 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   She will need a oophorectomy once chemotherapy is completed.   Return to clinic in weekly for Taxol treatments.

## 2022-05-21 ENCOUNTER — Telehealth: Payer: Self-pay | Admitting: Hematology and Oncology

## 2022-05-25 ENCOUNTER — Other Ambulatory Visit: Payer: Self-pay

## 2022-05-25 ENCOUNTER — Ambulatory Visit: Payer: Commercial Managed Care - HMO | Admitting: Neurology

## 2022-05-25 ENCOUNTER — Inpatient Hospital Stay: Payer: Commercial Managed Care - HMO

## 2022-05-25 VITALS — BP 120/89 | HR 81 | Temp 98.8°F | Resp 16

## 2022-05-25 DIAGNOSIS — C50512 Malignant neoplasm of lower-outer quadrant of left female breast: Secondary | ICD-10-CM | POA: Diagnosis not present

## 2022-05-25 MED ORDER — FILGRASTIM-AAFI 300 MCG/0.5ML IJ SOSY
300.0000 ug | PREFILLED_SYRINGE | Freq: Once | INTRAMUSCULAR | Status: AC
  Administered 2022-05-25: 300 ug via SUBCUTANEOUS
  Filled 2022-05-25: qty 0.5

## 2022-05-26 ENCOUNTER — Telehealth: Payer: Self-pay | Admitting: Anesthesiology

## 2022-05-26 MED FILL — Dexamethasone Sodium Phosphate Inj 100 MG/10ML: INTRAMUSCULAR | Qty: 1 | Status: AC

## 2022-05-26 NOTE — Telephone Encounter (Signed)
Pt called requesting refills on her medications  1. Which medications need refilled? Ubrelvi 100 mg  2. Which pharmacy/location is medication to be sent to? [facility] in [place], [place]   3. Do they need a 30 day or 90 day supply? 90 day supply      1. Which medications need refilled? Emgality  2. Which pharmacy/location is medication to be sent to? [facility] in [place], [place]  3. Do they need a 30 day or 90 day supply? 30 day supply

## 2022-05-27 ENCOUNTER — Other Ambulatory Visit: Payer: Self-pay

## 2022-05-27 ENCOUNTER — Inpatient Hospital Stay: Payer: Commercial Managed Care - HMO | Admitting: Adult Health

## 2022-05-27 ENCOUNTER — Inpatient Hospital Stay: Payer: Commercial Managed Care - HMO

## 2022-05-27 VITALS — BP 122/87 | HR 90 | Temp 98.1°F | Resp 16 | Wt 118.2 lb

## 2022-05-27 DIAGNOSIS — C50512 Malignant neoplasm of lower-outer quadrant of left female breast: Secondary | ICD-10-CM | POA: Diagnosis not present

## 2022-05-27 DIAGNOSIS — Z95828 Presence of other vascular implants and grafts: Secondary | ICD-10-CM

## 2022-05-27 DIAGNOSIS — Z171 Estrogen receptor negative status [ER-]: Secondary | ICD-10-CM

## 2022-05-27 LAB — CBC WITH DIFFERENTIAL (CANCER CENTER ONLY)
Abs Immature Granulocytes: 0.17 10*3/uL — ABNORMAL HIGH (ref 0.00–0.07)
Basophils Absolute: 0.1 10*3/uL (ref 0.0–0.1)
Basophils Relative: 1 %
Eosinophils Absolute: 0.1 10*3/uL (ref 0.0–0.5)
Eosinophils Relative: 1 %
HCT: 35.4 % — ABNORMAL LOW (ref 36.0–46.0)
Hemoglobin: 11.6 g/dL — ABNORMAL LOW (ref 12.0–15.0)
Immature Granulocytes: 2 %
Lymphocytes Relative: 15 %
Lymphs Abs: 1.7 10*3/uL (ref 0.7–4.0)
MCH: 32.1 pg (ref 26.0–34.0)
MCHC: 32.8 g/dL (ref 30.0–36.0)
MCV: 98.1 fL (ref 80.0–100.0)
Monocytes Absolute: 0.5 10*3/uL (ref 0.1–1.0)
Monocytes Relative: 4 %
Neutro Abs: 8.8 10*3/uL — ABNORMAL HIGH (ref 1.7–7.7)
Neutrophils Relative %: 77 %
Platelet Count: 302 10*3/uL (ref 150–400)
RBC: 3.61 MIL/uL — ABNORMAL LOW (ref 3.87–5.11)
RDW: 15.4 % (ref 11.5–15.5)
WBC Count: 11.3 10*3/uL — ABNORMAL HIGH (ref 4.0–10.5)
nRBC: 0 % (ref 0.0–0.2)

## 2022-05-27 LAB — CMP (CANCER CENTER ONLY)
ALT: 16 U/L (ref 0–44)
AST: 19 U/L (ref 15–41)
Albumin: 4.2 g/dL (ref 3.5–5.0)
Alkaline Phosphatase: 70 U/L (ref 38–126)
Anion gap: 5 (ref 5–15)
BUN: 19 mg/dL (ref 6–20)
CO2: 28 mmol/L (ref 22–32)
Calcium: 9.5 mg/dL (ref 8.9–10.3)
Chloride: 106 mmol/L (ref 98–111)
Creatinine: 0.54 mg/dL (ref 0.44–1.00)
GFR, Estimated: >60 mL/min (ref >60)
Glucose, Bld: 91 mg/dL (ref 70–99)
Potassium: 4 mmol/L (ref 3.5–5.1)
Sodium: 139 mmol/L (ref 135–145)
Total Bilirubin: 0.2 mg/dL — ABNORMAL LOW (ref 0.3–1.2)
Total Protein: 6.8 g/dL (ref 6.5–8.1)

## 2022-05-27 MED ORDER — SODIUM CHLORIDE 0.9 % IV SOLN: Freq: Once | INTRAVENOUS | Status: AC

## 2022-05-27 MED ORDER — SODIUM CHLORIDE 0.9% FLUSH
10.0000 mL | Freq: Once | INTRAVENOUS | Status: AC
  Administered 2022-05-27: 10 mL

## 2022-05-27 MED ORDER — FAMOTIDINE IN NACL 20-0.9 MG/50ML-% IV SOLN
20.0000 mg | Freq: Once | INTRAVENOUS | Status: AC
  Administered 2022-05-27: 20 mg via INTRAVENOUS
  Filled 2022-05-27: qty 50

## 2022-05-27 MED ORDER — DIPHENHYDRAMINE HCL 50 MG/ML IJ SOLN
25.0000 mg | Freq: Once | INTRAMUSCULAR | Status: AC
  Administered 2022-05-27: 25 mg via INTRAVENOUS
  Filled 2022-05-27: qty 1

## 2022-05-27 MED ORDER — HEPARIN SOD (PORK) LOCK FLUSH 100 UNIT/ML IV SOLN
500.0000 [IU] | Freq: Once | INTRAVENOUS | Status: AC | PRN
  Administered 2022-05-27: 500 [IU]

## 2022-05-27 MED ORDER — PALONOSETRON HCL INJECTION 0.25 MG/5ML
0.2500 mg | Freq: Once | INTRAVENOUS | Status: AC
  Administered 2022-05-27: 0.25 mg via INTRAVENOUS
  Filled 2022-05-27: qty 5

## 2022-05-27 MED ORDER — SODIUM CHLORIDE 0.9 % IV SOLN
65.0000 mg/m2 | Freq: Once | INTRAVENOUS | Status: AC
  Administered 2022-05-27: 96 mg via INTRAVENOUS
  Filled 2022-05-27: qty 16

## 2022-05-27 MED ORDER — SODIUM CHLORIDE 0.9% FLUSH
10.0000 mL | INTRAVENOUS | Status: DC | PRN
  Administered 2022-05-27: 10 mL

## 2022-05-27 MED ORDER — SODIUM CHLORIDE 0.9 % IV SOLN
10.0000 mg | Freq: Once | INTRAVENOUS | Status: AC
  Administered 2022-05-27: 10 mg via INTRAVENOUS
  Filled 2022-05-27: qty 10

## 2022-05-27 NOTE — Patient Instructions (Signed)
[facility]  Discharge Instructions: Thank you for choosing [facility] to provide your oncology and hematology care.   If you have a lab appointment with the Cancer Center, please go directly to the Cancer Center and check in at the registration area.   Wear comfortable clothing and clothing appropriate for easy access to any Portacath or PICC line.   We strive to give you quality time with your provider. You may need to reschedule your appointment if you arrive late (15 or more minutes).  Arriving late affects you and other patients whose appointments are after yours.  Also, if you miss three or more appointments without notifying the office, you may be dismissed from the clinic at the provider's discretion.      For prescription refill requests, have your pharmacy contact our office and allow 72 hours for refills to be completed.    Today you received the following chemotherapy and/or immunotherapy agents Taxol      To help prevent nausea and vomiting after your treatment, we encourage you to take your nausea medication as directed.  BELOW ARE SYMPTOMS THAT SHOULD BE REPORTED IMMEDIATELY: *FEVER GREATER THAN 100.4 F (38 C) OR HIGHER *CHILLS OR SWEATING *NAUSEA AND VOMITING THAT IS NOT CONTROLLED WITH YOUR NAUSEA MEDICATION *UNUSUAL SHORTNESS OF BREATH *UNUSUAL BRUISING OR BLEEDING *URINARY PROBLEMS (pain or burning when urinating, or frequent urination) *BOWEL PROBLEMS (unusual diarrhea, constipation, pain near the anus) TENDERNESS IN MOUTH AND THROAT WITH OR WITHOUT PRESENCE OF ULCERS (sore throat, sores in mouth, or a toothache) UNUSUAL RASH, SWELLING OR PAIN  UNUSUAL VAGINAL DISCHARGE OR ITCHING   Items with * indicate a potential emergency and should be followed up as soon as possible or go to the Emergency Department if any problems should occur.  Please show the CHEMOTHERAPY ALERT CARD or IMMUNOTHERAPY ALERT CARD at check-in  to the Emergency Department and triage nurse.  Should you have questions after your visit or need to cancel or reschedule your appointment, please contact [facility]  Dept: [phone]  and follow the prompts.  Office hours are 8:00 a.m. to 4:30 p.m. Monday - Friday. Please note that voicemails left after 4:00 p.m. may not be returned until the following business day.  We are closed weekends and major holidays. You have access to a nurse at all times for urgent questions. Please call the main number to the clinic Dept: [phone] and follow the prompts.   For any non-urgent questions, you may also contact your provider using MyChart. We now offer e-Visits for anyone 18 and older to request care online for non-urgent symptoms. For details visit [email or web address].   Also download the MyChart app! Go to the app store, search "MyChart", open the app, select , and log in with your MyChart username and password.   

## 2022-05-28 MED ORDER — UBRELVY 100 MG PO TABS: ORAL_TABLET | ORAL | 3 refills | Status: DC

## 2022-05-28 MED ORDER — EMGALITY 120 MG/ML ~~LOC~~ SOAJ: 120.0000 mg | SUBCUTANEOUS | 3 refills | Status: DC

## 2022-06-01 ENCOUNTER — Other Ambulatory Visit: Payer: Self-pay

## 2022-06-01 ENCOUNTER — Inpatient Hospital Stay: Payer: Commercial Managed Care - HMO

## 2022-06-01 VITALS — BP 127/94 | HR 82 | Temp 98.4°F | Resp 17

## 2022-06-01 DIAGNOSIS — Z171 Estrogen receptor negative status [ER-]: Secondary | ICD-10-CM

## 2022-06-01 DIAGNOSIS — C50512 Malignant neoplasm of lower-outer quadrant of left female breast: Secondary | ICD-10-CM | POA: Diagnosis not present

## 2022-06-01 MED ORDER — FILGRASTIM-AAFI 300 MCG/0.5ML IJ SOSY
300.0000 ug | PREFILLED_SYRINGE | Freq: Once | INTRAMUSCULAR | Status: AC
  Administered 2022-06-01: 300 ug via SUBCUTANEOUS
  Filled 2022-06-01: qty 0.5

## 2022-06-01 NOTE — Patient Instructions (Signed)
Filgrastim Injection What is this medication? FILGRASTIM (fil GRA stim) lowers the risk of infection in people who are receiving chemotherapy. It works by helping your body make more white blood cells, which protects your body from infection. It may also be used to help people who have been exposed to high doses of radiation. It can be used to help prepare your body before a stem cell transplant. It works by helping your bone marrow make and release stem cells into the blood. This medicine may be used for other purposes; ask your health care provider or pharmacist if you have questions. COMMON BRAND NAME(S): Neupogen, Nivestym, Releuko, Zarxio What should I tell my care team before I take this medication? They need to know if you have any of these conditions: History of blood diseases, such as sickle cell anemia Kidney disease Recent or ongoing radiation An unusual or allergic reaction to filgrastim, pegfilgrastim, latex, rubber, other medications, foods, dyes, or preservatives Pregnant or trying to get pregnant Breast-feeding How should I use this medication? This medication is injected under the skin or into a vein. It is usually given by your care team in a hospital or clinic setting. It may be given at home. If you get this medication at home, you will be taught how to prepare and give it. Use exactly as directed. Take it as directed on the prescription label at the same time every day. Keep taking it unless your care team tells you to stop. It is important that you put your used needles and syringes in a special sharps container. Do not put them in a trash can. If you do not have a sharps container, call your pharmacist or care team to get one. This medication comes with INSTRUCTIONS FOR USE. Ask your pharmacist for directions on how to use this medication. Read the information carefully. Talk to your pharmacist or care team if you have questions. Talk to your care team about the use of this  medication in children. While it may be prescribed for children for selected conditions, precautions do apply. Overdosage: If you think you have taken too much of this medicine contact a poison control center or emergency room at once. NOTE: This medicine is only for you. Do not share this medicine with others. What if I miss a dose? It is important not to miss any doses. Talk to your care team about what to do if you miss a dose. What may interact with this medication? Medications that may cause a release of neutrophils, such as lithium This list may not describe all possible interactions. Give your health care provider a list of all the medicines, herbs, non-prescription drugs, or dietary supplements you use. Also tell them if you smoke, drink alcohol, or use illegal drugs. Some items may interact with your medicine. What should I watch for while using this medication? Your condition will be monitored carefully while you are receiving this medication. You may need bloodwork while taking this medication. Talk to your care team about your risk of cancer. You may be more at risk for certain types of cancer if you take this medication. What side effects may I notice from receiving this medication? Side effects that you should report to your care team as soon as possible: Allergic reactions--skin rash, itching, hives, swelling of the face, lips, tongue, or throat Capillary leak syndrome--stomach or muscle pain, unusual weakness or fatigue, feeling faint or lightheaded, decrease in the amount of urine, swelling of the ankles, hands, or   feet, trouble breathing High white blood cell level--fever, fatigue, trouble breathing, night sweats, change in vision, weight loss Inflammation of the aorta--fever, fatigue, back, chest, or stomach pain, severe headache Kidney injury (glomerulonephritis)--decrease in the amount of urine, red or dark brown urine, foamy or bubbly urine, swelling of the ankles, hands, or  feet Shortness of breath or trouble breathing Spleen injury--pain in upper left stomach or shoulder Unusual bruising or bleeding Side effects that usually do not require medical attention (report to your care team if they continue or are bothersome): Back pain Bone pain Fatigue Fever Headache Nausea This list may not describe all possible side effects. Call your doctor for medical advice about side effects. You may report side effects to FDA at 1-800-FDA-1088. Where should I keep my medication? Keep out of the reach of children and pets. Keep this medication in the original packaging until you are ready to take it. Protect from light. See product for storage information. Each product may have different instructions. Get rid of any unused medication after the expiration date. To get rid of medications that are no longer needed or have expired: Take the medication to a medications take-back program. Check with your pharmacy or law enforcement to find a location. If you cannot return the medication, ask your pharmacist or care team how to get rid of this medication safely. NOTE: This sheet is a summary. It may not cover all possible information. If you have questions about this medicine, talk to your doctor, pharmacist, or health care provider.  2023 Elsevier/Gold Standard (2021-04-28 00:00:00)  

## 2022-06-02 ENCOUNTER — Other Ambulatory Visit: Payer: Self-pay | Admitting: Neurology

## 2022-06-02 MED FILL — Dexamethasone Sodium Phosphate Inj 100 MG/10ML: INTRAMUSCULAR | Qty: 1 | Status: AC

## 2022-06-02 NOTE — Progress Notes (Signed)
Patient Care Team: Pcp, No as PCP - General Donnelly Angelica, RN as Oncology Nurse Navigator Pershing Proud, RN as Oncology Nurse Navigator Abigail Miyamoto, MD as Consulting Physician (General Surgery) Serena Croissant, MD as Consulting Physician (Hematology and Oncology) Dorothy Puffer, MD as Consulting Physician (Radiation Oncology)  DIAGNOSIS: No diagnosis found.  SUMMARY OF ONCOLOGIC HISTORY: Oncology History  Malignant neoplasm of lower-outer quadrant of left breast of female, estrogen receptor negative (HCC)  09/07/2021 Initial Diagnosis   Screening mammogram detected left breast asymmetry posteriorly.  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  Report date is September 26, 2021 (BRCAPlus) and September 29, 2021 (expanded).    The CancerNext-Expanded gene panel offered by [facility] and includes sequencing, rearrangement, and RNA analysis for the following 77 genes: AIP, ALK, APC, ATM, AXIN2, BAP1, BARD1, BLM, BMPR1A, BRCA1, BRCA2, BRIP1, CDC73, CDH1, CDK4, CDKN1B, CDKN2A, CHEK2, CTNNA1, DICER1, FANCC, FH, FLCN, GALNT12, KIF1B, LZTR1, MAX, MEN1, MET, MLH1, MSH2, MSH3, MSH6, MUTYH, NBN, NF1, NF2, NTHL1, PALB2, PHOX2B, PMS2, POT1, PRKAR1A, PTCH1, PTEN, RAD51C, RAD51D, RB1, RECQL, RET, SDHA, SDHAF2, SDHB, SDHC, SDHD, SMAD4, SMARCA4, SMARCB1, SMARCE1, STK11, SUFU, TMEM127, TP53, TSC1, TSC2, VHL and XRCC2 (sequencing and deletion/duplication); EGFR, EGLN1, HOXB13, KIT, MITF, PDGFRA, POLD1, and POLE  (sequencing only); EPCAM and GREM1 (deletion/duplication only).    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  MEDICATIONS:  Current Outpatient Medications  Medication Sig Dispense Refill   acetaminophen (TYLENOL) 500 MG tablet Take 500-1,000 mg by mouth every 6 (six) hours as needed (pain.).     amoxicillin (AMOXIL) 500 MG tablet Take 1 tablet (500 mg total) by mouth 2 (two) times daily. 10 tablet 0   diazepam (VALIUM) 2 MG tablet Take 1 tablet (2 mg total) by mouth every 12 (twelve) hours as needed for up to 20 doses for muscle spasms. 20 tablet 0   FILGRASTIM IJ Inject as directed once a week.     Galcanezumab-gnlm (EMGALITY) 120 MG/ML SOAJ Inject 120 mg into the skin every 28 (twenty-eight) days. 1 mL 3   nortriptyline (PAMELOR) 10 MG capsule Take 1 capsule (10 mg total) by mouth at bedtime. 30 capsule 5   ondansetron (ZOFRAN) 4 MG tablet Take 1 tablet (4 mg total) by mouth every 8 (eight) hours as needed for up to 20 doses for nausea or vomiting. (Patient not taking: Reported on 05/20/2022) 20 tablet 0   SUMAtriptan (IMITREX) 50 MG tablet TAKE 1 TAB BY MOUTH AS NEEDED FOR MIGRAINE. MAX 2 TABS IN 24 HOURS. MAY REPEAT IN 2 HOURS IF HEADACHE PERSISTS OR RECURS.  30 tablet 1   Ubrogepant (UBRELVY) 100 MG TABS TAKE AS NEEDED (TAKE 1 AT THE EARLIST ONSET OF A MIGRAINE. MAY REPEAT IN 2 HOURS. MAX 2/24 HOURS). 16 tablet 3   Zavegepant HCl (ZAVZPRET) 10 MG/ACT SOLN Place 10 mg into the nose daily as needed. 6 each 11   No current facility-administered medications for this visit.    PHYSICAL  EXAMINATION: ECOG PERFORMANCE STATUS: {CHL ONC ECOG [phone]}  There were no vitals filed for this visit. There were no vitals filed for this visit.  BREAST:*** No palpable masses or nodules in either right or left breasts. No palpable axillary supraclavicular or infraclavicular adenopathy no breast tenderness or nipple discharge. (exam performed in the presence of a chaperone)  LABORATORY DATA:  I have reviewed the data as listed    Latest Ref Rng & Units 05/27/2022   11:21 AM 05/20/2022   10:13 AM 05/13/2022   10:05 AM  CMP  Glucose 70 - 99 mg/dL 91  99  97   BUN 6 - 20 mg/dL 19  11  11    Creatinine 0.44 - 1.00 mg/dL 4.09  8.11  9.14   Sodium 135 - 145 mmol/L 139  139  140   Potassium 3.5 - 5.1 mmol/L 4.0  4.0  4.1   Chloride 98 - 111 mmol/L 106  108  107   CO2 22 - 32 mmol/L 28  26  27    Calcium 8.9 - 10.3 mg/dL 9.5  9.3  9.5   Total Protein 6.5 - 8.1 g/dL 6.8  6.5  6.8   Total Bilirubin 0.3 - 1.2 mg/dL 0.2  0.2  0.3   Alkaline Phos 38 - 126 U/L 70  67  73   AST 15 - 41 U/L 19  15  14    ALT 0 - 44 U/L 16  11  11      Lab Results  Component Value Date   WBC 11.3 (H) 05/27/2022   HGB 11.6 (L) 05/27/2022   HCT 35.4 (L) 05/27/2022   MCV 98.1 05/27/2022   PLT 302 05/27/2022   NEUTROABS 8.8 (H) 05/27/2022    ASSESSMENT & PLAN:  No problem-specific Assessment & Plan notes found for this encounter.    No orders of the defined types were placed in this encounter.  The patient has a good understanding of the overall plan. she agrees with it. she will call with any problems that may develop before the next visit here. Total time spent: 30 mins including face to face time and time spent for planning, charting and co-ordination of care   Julie Townsend, CMA 06/02/22    I Janan Ridge am acting as a Neurosurgeon for [employer]  ***

## 2022-06-03 ENCOUNTER — Other Ambulatory Visit: Payer: Self-pay

## 2022-06-03 ENCOUNTER — Inpatient Hospital Stay: Payer: Commercial Managed Care - HMO | Attending: Hematology and Oncology

## 2022-06-03 ENCOUNTER — Inpatient Hospital Stay (HOSPITAL_BASED_OUTPATIENT_CLINIC_OR_DEPARTMENT_OTHER): Payer: Commercial Managed Care - HMO | Admitting: Hematology and Oncology

## 2022-06-03 ENCOUNTER — Inpatient Hospital Stay: Payer: Commercial Managed Care - HMO

## 2022-06-03 VITALS — BP 113/84 | HR 94 | Resp 17

## 2022-06-03 VITALS — BP 126/81 | HR 95 | Temp 97.2°F | Resp 18 | Ht 62.0 in | Wt 118.5 lb

## 2022-06-03 DIAGNOSIS — C50512 Malignant neoplasm of lower-outer quadrant of left female breast: Secondary | ICD-10-CM | POA: Insufficient documentation

## 2022-06-03 DIAGNOSIS — R5383 Other fatigue: Secondary | ICD-10-CM | POA: Insufficient documentation

## 2022-06-03 DIAGNOSIS — R110 Nausea: Secondary | ICD-10-CM | POA: Diagnosis not present

## 2022-06-03 DIAGNOSIS — Z171 Estrogen receptor negative status [ER-]: Secondary | ICD-10-CM | POA: Diagnosis not present

## 2022-06-03 DIAGNOSIS — Z9013 Acquired absence of bilateral breasts and nipples: Secondary | ICD-10-CM | POA: Diagnosis not present

## 2022-06-03 DIAGNOSIS — Z95828 Presence of other vascular implants and grafts: Secondary | ICD-10-CM

## 2022-06-03 DIAGNOSIS — Z1501 Genetic susceptibility to malignant neoplasm of breast: Secondary | ICD-10-CM | POA: Diagnosis not present

## 2022-06-03 DIAGNOSIS — T451X5A Adverse effect of antineoplastic and immunosuppressive drugs, initial encounter: Secondary | ICD-10-CM | POA: Insufficient documentation

## 2022-06-03 DIAGNOSIS — Z803 Family history of malignant neoplasm of breast: Secondary | ICD-10-CM | POA: Diagnosis not present

## 2022-06-03 DIAGNOSIS — D6481 Anemia due to antineoplastic chemotherapy: Secondary | ICD-10-CM | POA: Insufficient documentation

## 2022-06-03 DIAGNOSIS — Z8041 Family history of malignant neoplasm of ovary: Secondary | ICD-10-CM | POA: Diagnosis not present

## 2022-06-03 DIAGNOSIS — Z800 Family history of malignant neoplasm of digestive organs: Secondary | ICD-10-CM | POA: Diagnosis not present

## 2022-06-03 DIAGNOSIS — Z5111 Encounter for antineoplastic chemotherapy: Secondary | ICD-10-CM | POA: Insufficient documentation

## 2022-06-03 DIAGNOSIS — Z79899 Other long term (current) drug therapy: Secondary | ICD-10-CM | POA: Diagnosis not present

## 2022-06-03 DIAGNOSIS — R519 Headache, unspecified: Secondary | ICD-10-CM | POA: Insufficient documentation

## 2022-06-03 DIAGNOSIS — D709 Neutropenia, unspecified: Secondary | ICD-10-CM | POA: Diagnosis not present

## 2022-06-03 LAB — CMP (CANCER CENTER ONLY)
ALT: 11 U/L (ref 0–44)
AST: 15 U/L (ref 15–41)
Albumin: 4.3 g/dL (ref 3.5–5.0)
Alkaline Phosphatase: 71 U/L (ref 38–126)
Anion gap: 6 (ref 5–15)
BUN: 17 mg/dL (ref 6–20)
CO2: 29 mmol/L (ref 22–32)
Calcium: 9.3 mg/dL (ref 8.9–10.3)
Chloride: 105 mmol/L (ref 98–111)
Creatinine: 0.53 mg/dL (ref 0.44–1.00)
GFR, Estimated: >60 mL/min (ref >60)
Glucose, Bld: 93 mg/dL (ref 70–99)
Potassium: 4 mmol/L (ref 3.5–5.1)
Sodium: 140 mmol/L (ref 135–145)
Total Bilirubin: 0.2 mg/dL — ABNORMAL LOW (ref 0.3–1.2)
Total Protein: 6.8 g/dL (ref 6.5–8.1)

## 2022-06-03 LAB — CBC WITH DIFFERENTIAL (CANCER CENTER ONLY)
Abs Immature Granulocytes: 0.24 10*3/uL — ABNORMAL HIGH (ref 0.00–0.07)
Basophils Absolute: 0.1 10*3/uL (ref 0.0–0.1)
Basophils Relative: 1 %
Eosinophils Absolute: 0.1 10*3/uL (ref 0.0–0.5)
Eosinophils Relative: 1 %
HCT: 34.2 % — ABNORMAL LOW (ref 36.0–46.0)
Hemoglobin: 11.5 g/dL — ABNORMAL LOW (ref 12.0–15.0)
Immature Granulocytes: 2 %
Lymphocytes Relative: 15 %
Lymphs Abs: 1.5 10*3/uL (ref 0.7–4.0)
MCH: 33 pg (ref 26.0–34.0)
MCHC: 33.6 g/dL (ref 30.0–36.0)
MCV: 98 fL (ref 80.0–100.0)
Monocytes Absolute: 0.5 10*3/uL (ref 0.1–1.0)
Monocytes Relative: 5 %
Neutro Abs: 7.9 10*3/uL — ABNORMAL HIGH (ref 1.7–7.7)
Neutrophils Relative %: 76 %
Platelet Count: 287 10*3/uL (ref 150–400)
RBC: 3.49 MIL/uL — ABNORMAL LOW (ref 3.87–5.11)
RDW: 16.2 % — ABNORMAL HIGH (ref 11.5–15.5)
WBC Count: 10.4 10*3/uL (ref 4.0–10.5)
nRBC: 0 % (ref 0.0–0.2)

## 2022-06-03 MED ORDER — SODIUM CHLORIDE 0.9 % IV SOLN
65.0000 mg/m2 | Freq: Once | INTRAVENOUS | Status: AC
  Administered 2022-06-03: 96 mg via INTRAVENOUS
  Filled 2022-06-03: qty 16

## 2022-06-03 MED ORDER — SODIUM CHLORIDE 0.9% FLUSH
10.0000 mL | Freq: Once | INTRAVENOUS | Status: AC
  Administered 2022-06-03: 10 mL

## 2022-06-03 MED ORDER — SODIUM CHLORIDE 0.9% FLUSH
10.0000 mL | INTRAVENOUS | Status: DC | PRN
  Administered 2022-06-03: 10 mL

## 2022-06-03 MED ORDER — HEPARIN SOD (PORK) LOCK FLUSH 100 UNIT/ML IV SOLN
500.0000 [IU] | Freq: Once | INTRAVENOUS | Status: AC | PRN
  Administered 2022-06-03: 500 [IU]

## 2022-06-03 MED ORDER — FAMOTIDINE 20 MG IN NS 100 ML IVPB
20.0000 mg | Freq: Once | INTRAVENOUS | Status: AC
  Administered 2022-06-03: 20 mg via INTRAVENOUS
  Filled 2022-06-03: qty 100

## 2022-06-03 MED ORDER — PALONOSETRON HCL INJECTION 0.25 MG/5ML
0.2500 mg | Freq: Once | INTRAVENOUS | Status: AC
  Administered 2022-06-03: 0.25 mg via INTRAVENOUS
  Filled 2022-06-03: qty 5

## 2022-06-03 MED ORDER — SODIUM CHLORIDE 0.9 % IV SOLN
10.0000 mg | Freq: Once | INTRAVENOUS | Status: AC
  Administered 2022-06-03: 10 mg via INTRAVENOUS
  Filled 2022-06-03: qty 10

## 2022-06-03 MED ORDER — SODIUM CHLORIDE 0.9 % IV SOLN: Freq: Once | INTRAVENOUS | Status: AC

## 2022-06-03 MED ORDER — DIPHENHYDRAMINE HCL 50 MG/ML IJ SOLN
25.0000 mg | Freq: Once | INTRAMUSCULAR | Status: AC
  Administered 2022-06-03: 25 mg via INTRAVENOUS
  Filled 2022-06-03: qty 1

## 2022-06-03 NOTE — Assessment & Plan Note (Signed)
09/07/2021:Screening mammogram detected left breast asymmetry posteriorly.  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, margins negative, 0/5 sentinel lymph nodes negative, ER 0%, PR 0%, HER2 negative, Ki-67 70%   Treatment plan: Adjuvant chemotherapy with Adriamycin and Cytoxan followed by Taxol started 12/03/2021 No role of adjuvant radiation or antiestrogen therapy --------------------------------------------------------------------------------------------------------------------------------------------------------------------- Current treatment: Completed 4 cycles of dose dense Adriamycin and Cytoxan, today cycle 12 Taxol   Chemotoxicities: Nausea:  fatigue Chemotherapy-induced anemia: Today's hemoglobin is 11.3 Body aches and pains at home: Improved with Claritin Neutropenia: Resolved with Neupogen injection given couple of days before each chemo. 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   She will need a oophorectomy once chemotherapy is completed. She plans to do latissimus dorsi breast reconstruction surgery.  At the same time she can get the port removed or we can asked Dr. Magnus Ivan to remove it.   Return to clinic in 3 months for survivorship care plan visit

## 2022-06-04 ENCOUNTER — Encounter: Payer: Self-pay | Admitting: Plastic Surgery

## 2022-06-04 ENCOUNTER — Telehealth: Payer: Self-pay | Admitting: Hematology and Oncology

## 2022-06-04 ENCOUNTER — Ambulatory Visit (INDEPENDENT_AMBULATORY_CARE_PROVIDER_SITE_OTHER): Payer: Commercial Managed Care - HMO | Admitting: Plastic Surgery

## 2022-06-04 VITALS — BP 128/86 | HR 100

## 2022-06-04 DIAGNOSIS — Z1509 Genetic susceptibility to other malignant neoplasm: Secondary | ICD-10-CM

## 2022-06-04 DIAGNOSIS — Z1501 Genetic susceptibility to malignant neoplasm of breast: Secondary | ICD-10-CM

## 2022-06-04 DIAGNOSIS — Z95828 Presence of other vascular implants and grafts: Secondary | ICD-10-CM

## 2022-06-04 DIAGNOSIS — C50512 Malignant neoplasm of lower-outer quadrant of left female breast: Secondary | ICD-10-CM

## 2022-06-04 DIAGNOSIS — Z171 Estrogen receptor negative status [ER-]: Secondary | ICD-10-CM

## 2022-06-04 NOTE — Progress Notes (Signed)
   Subjective:    Patient ID: Julie Townsend, female    DOB: [birth year], 57 y.o.   MRN: [id number]  The patient is a 57 year old female here with her husband for evaluation of her breasts.  She was first seen in September 2023 with left breast cancer.  She underwent bilateral mastectomies with expander placement.  She had several concerning episodes of the right breast that required removal of the expander.  She is doing really well now.  There is no sign of infection.  She would like to proceed with latissimus and expander placement reconstruction.  She finished her chemotherapy this week.  She should be safe for surgery in June.      Review of Systems  Constitutional: Negative.   HENT: Negative.    Eyes: Negative.   Respiratory: Negative.    Cardiovascular: Negative.   Gastrointestinal: Negative.   Endocrine: Negative.   Genitourinary: Negative.        Objective:   Physical Exam Constitutional:      Appearance: Normal appearance.  HENT:     Head: Normocephalic and atraumatic.  Cardiovascular:     Rate and Rhythm: Normal rate.     Pulses: Normal pulses.  Musculoskeletal:        General: No swelling or deformity.  Skin:    General: Skin is warm.     Capillary Refill: Capillary refill takes less than 2 seconds.     Coloration: Skin is not jaundiced.     Findings: No bruising.  Neurological:     Mental Status: She is alert and oriented to person, place, and time.  Psychiatric:        Mood and Affect: Mood normal.        Behavior: Behavior normal.        Thought Content: Thought content normal.        Judgment: Judgment normal.        Assessment & Plan:     ICD-10-CM   1. Port-A-Cath in place  Z95.828     2. BRCA2 gene mutation positive  Z15.01    Z15.09     3. Malignant neoplasm of lower-outer quadrant of left breast of female, estrogen receptor negative (HCC)  C50.512    Z17.1        Plan for right breast reconstruction with expander and latissimus  flap reconstruction.  We reviewed other options and she is in agreement with this 1 as well as I am.  She would like to have the Port-A-Cath removed at the same time if possible.

## 2022-06-04 NOTE — Telephone Encounter (Signed)
Cancelled appointment per provider 5/2 los. Left voicemail.

## 2022-06-08 ENCOUNTER — Inpatient Hospital Stay: Payer: Commercial Managed Care - HMO

## 2022-06-09 ENCOUNTER — Encounter: Payer: Self-pay | Admitting: *Deleted

## 2022-06-09 ENCOUNTER — Encounter: Payer: Self-pay | Admitting: Plastic Surgery

## 2022-06-09 ENCOUNTER — Encounter: Payer: Self-pay | Admitting: Hematology and Oncology

## 2022-06-09 ENCOUNTER — Other Ambulatory Visit: Payer: Self-pay | Admitting: *Deleted

## 2022-06-09 DIAGNOSIS — Z171 Estrogen receptor negative status [ER-]: Secondary | ICD-10-CM

## 2022-06-09 NOTE — Progress Notes (Signed)
Per MD request RN successfully faxed signatera request to [phone]. 

## 2022-06-10 ENCOUNTER — Telehealth: Payer: Self-pay | Admitting: Plastic Surgery

## 2022-06-10 ENCOUNTER — Other Ambulatory Visit: Payer: Self-pay | Admitting: Surgery

## 2022-06-10 NOTE — Telephone Encounter (Signed)
Pending Ref# [id number] for Rt Breast reconstruciton.

## 2022-06-14 ENCOUNTER — Encounter: Payer: Self-pay | Admitting: Gastroenterology

## 2022-06-14 ENCOUNTER — Telehealth: Payer: Self-pay | Admitting: Plastic Surgery

## 2022-06-14 NOTE — Telephone Encounter (Signed)
Notified pt of approval of insurance for Rt breast reconstruction and that the scheduler will be reaching out to her when she returns to get her scheduled.

## 2022-06-15 ENCOUNTER — Encounter: Payer: Self-pay | Admitting: Gynecologic Oncology

## 2022-06-17 NOTE — Progress Notes (Unsigned)
GYNECOLOGIC ONCOLOGY NEW PATIENT CONSULTATION   Patient Name: Julie Townsend  Patient Age: 57 y.o. Date of Service: 06/18/22 Referring Provider: Serena Croissant, MD [address] [place],  [place] [address]   Primary Care Provider: Pcp, No Consulting Provider: Eugene Garnet, MD   Assessment/Plan:  Postmenopausal patient with recent triple negative breast cancer diagnosis in the setting of a BRCA2 mutation.  The absolute risk of ovarian cancer in patients with BRCA 2 mutations is estimated to be 13-29%.  In addition, patients with BRCA mutations are also at increased risk of breast cancer, and an increased (but still low) risk of pancreatic cancer and melanoma.  The [employer] ([employer]) recommends removal of bilateral ovaries and fallopian tubes between the ages of 15-40, and/or when childbearing is completed, to reduce the risk of ovarian and fallopian tube cancer.  Given the later increase in incidence of ovarian cancer in patients with a BRCA2 mutation, it is reasonable to wait until closer to 32-76 years of age prior to risk-reducing surgery.  If a patient does not undergo risk-reducing surgery, it is recommended they have CA-125 serum levels and pelvic ultrasounds every 6 months as a screening approach, although these are not particularly sensitive methods of screening.  Preventive surgery to remove the ovaries and fallopian tubes reduces the risk of a related cancer by 80% in women who carry a BRCA1 or BRCA2 mutation.  Women who undergo preventive surgery retain a 4% risk of developing cancer of the peritoneum.   We also discussed the pros and cons of removing the uterus.  Her estrogen receptor negative breast cancer, the patient will not be on antiestrogen therapy.  Proceeding with hysterectomy is optional.  After this discussion, Ms. Townsend proceed with risk reducing surgery but will do so after she has her breast reconstruction at  the end of June.  We tentatively plan for surgery in mid August, about 6 weeks after her breast surgery.  Given the interval between now and surgery, we plan to obtain a CA125 and pelvic ultrasound.  I will see her back closer to the date of surgery for preoperative visit.  She will think about whether she would like concurrent hysterectomy between now and her next visit with me.  A copy of this note was sent to the patient's referring provider.   55 minutes of total time was spent for this patient encounter, including preparation, face-to-face counseling with the patient and coordination of care, and documentation of the encounter.  Eugene Garnet, MD  Division of Gynecologic Oncology  Department of Obstetrics and Gynecology  [facility]  ___________________________________________  Chief Complaint: Chief Complaint  Patient presents with   BRCA positive    History of Present Illness:  Julie Townsend is a 57 y.o. y.o. female who is seen in consultation at the request of Serena Croissant, MD for an evaluation of risk reducing BSO in the setting of a BRCA2 mutation.  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 She has not undergone surgery followed by chemotherapy (adraimycin/cytoxan then taxol). 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  The patient finished treatment for her breast cancer on May 2.  She is undergoing reconstructive surgery in late June.  She endorses a good appetite without nausea or emesis.  She had some very mild weight gain with chemotherapy, working on losing this now.  She endorses normal bowel bladder function.  Denies any vaginal  bleeding or discharge.  PAST MEDICAL HISTORY:  Past Medical History:  Diagnosis Date   BRCA2 gene mutation positive 09/26/2021   Breast cancer (HCC) 09/2021   left breast IDC   Family history of breast cancer 09/22/2021   Family history of  ovarian cancer 09/22/2021   Family history of pancreatic cancer 09/22/2021   Genetic testing 09/26/2021   Pathogenic variant in BRCA2 at p.Z6109* (c.3922G>T).  Report date is September 26, 2021.    The BRCAplus panel offered by [employer] and includes sequencing and deletion/duplication analysis for the following 8 genes: ATM, BRCA1, BRCA2, CDH1, CHEK2, PALB2, PTEN, and TP53.  Results of pan-cancer panel are pending.     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    correct Hands     PAST SURGICAL HISTORY:  Past Surgical History:  Procedure Laterality Date   BREAST IMPLANT REMOVAL Right 12/17/2021   Procedure: Right breast expander removal with washout;  Surgeon: Peggye Form, DO;  Location: [facility];  Service: Plastics;  Laterality: Right;   BREAST RECONSTRUCTION WITH PLACEMENT OF TISSUE EXPANDER AND FLEX HD (ACELLULAR HYDRATED DERMIS) Bilateral 10/29/2021   Procedure: BREAST RECONSTRUCTION WITH PLACEMENT OF TISSUE EXPANDER AND FLEX HD (ACELLULAR HYDRATED DERMIS);  Surgeon: Peggye Form, DO;  Location: [facility];  Service: Plastics;  Laterality: Bilateral;   CARPAL TUNNEL RELEASE Right 06/20/2020   Procedure: CARPAL TUNNEL RELEASE;  Surgeon: Betha Loa, MD;  Location: [facility];  Service: Orthopedics;  Laterality: Right;   IRRIGATION AND DEBRIDEMENT OF WOUND WITH SPLIT THICKNESS SKIN GRAFT Right 03/15/2022   Procedure: Debridement of right breast;  Surgeon: Peggye Form, DO;  Location: [facility];  Service: Plastics;  Laterality: Right;   MASTECTOMY W/ SENTINEL NODE BIOPSY Left 10/29/2021   Procedure: LEFT MASTECTOMY WITH LEFT SENTINEL LYMPH NODE BIOPSY;  Surgeon: Abigail Miyamoto, MD;  Location: [facility];  Service: General;  Laterality: Left;   PORTACATH PLACEMENT Right 10/29/2021   Procedure: INSERTION PORT-A-CATH;  Surgeon: Abigail Miyamoto, MD;  Location: [facility];   Service: General;  Laterality: Right;   TOTAL MASTECTOMY Right 10/29/2021   Procedure: RIGHT TOTAL MASTECTOMY;  Surgeon: Abigail Miyamoto, MD;  Location: [facility];  Service: General;  Laterality: Right;   TRIGGER FINGER RELEASE Left 05/08/2020   Procedure: LEFT LONG FINGER TRIGGER RELEASE AND LEFT RING FINGER TRIGGER RELEASE;  Surgeon: Betha Loa, MD;  Location: [facility];  Service: Orthopedics;  Laterality: Left;   trigger finger release rt hand      OB/GYN HISTORY:  OB History  Gravida Para Term Preterm AB Living  2 2          SAB IAB Ectopic Multiple Live Births               # Outcome Date GA Lbr Len/2nd Weight Sex Delivery Anes PTL Lv  2 Para           1 Para             No LMP recorded. Patient is postmenopausal.  Age at menarche: 41 Age at menopause: 5.  Had minimal hot flashes.  Notes insomnia, otherwise denies symptoms. Hx of HRT: Denies Hx of STDs: Denies Last pap: 2023 History of abnormal pap smears: Denies  SCREENING STUDIES:  Last mammogram: 2023  Last colonoscopy: 2017  MEDICATIONS: Outpatient Encounter Medications as of 06/18/2022  Medication Sig   Galcanezumab-gnlm (EMGALITY) 120  MG/ML SOAJ Inject 120 mg into the skin every 28 (twenty-eight) days.   nortriptyline (PAMELOR) 10 MG capsule Take 1 capsule (10 mg total) by mouth at bedtime.   SUMAtriptan (IMITREX) 50 MG tablet TAKE 1 TAB BY MOUTH AS NEEDED FOR MIGRAINE. MAX 2 TABS IN 24 HOURS. MAY REPEAT IN 2 HOURS IF HEADACHE PERSISTS OR RECURS.   Ubrogepant (UBRELVY) 100 MG TABS TAKE AS NEEDED (TAKE 1 AT THE EARLIST ONSET OF A MIGRAINE. MAY REPEAT IN 2 HOURS. MAX 2/24 HOURS).   [DISCONTINUED] acetaminophen (TYLENOL) 500 MG tablet Take 500-1,000 mg by mouth every 6 (six) hours as needed (pain.).   [DISCONTINUED] amoxicillin (AMOXIL) 500 MG tablet Take 1 tablet (500 mg total) by mouth 2 (two) times daily.   [DISCONTINUED] diazepam (VALIUM) 2 MG tablet Take 1 tablet (2 mg total) by  mouth every 12 (twelve) hours as needed for up to 20 doses for muscle spasms.   [DISCONTINUED] FILGRASTIM IJ Inject as directed once a week.   [DISCONTINUED] ondansetron (ZOFRAN) 4 MG tablet Take 1 tablet (4 mg total) by mouth every 8 (eight) hours as needed for up to 20 doses for nausea or vomiting. (Patient not taking: Reported on 06/03/2022)   [DISCONTINUED] Zavegepant HCl (ZAVZPRET) 10 MG/ACT SOLN Place 10 mg into the nose daily as needed.   No facility-administered encounter medications on file as of 06/18/2022.    ALLERGIES:  No Known Allergies   FAMILY HISTORY:  Family History  Problem Relation Age of Onset   Migraines Mother    Breast cancer Mother 28       recurr at  5   Migraines Maternal Aunt    Colon cancer Maternal Uncle        dx late 60s   Pancreatic cancer Maternal Uncle 28   Migraines Maternal Grandmother    Ovarian cancer Maternal Grandmother        dx 73s     SOCIAL HISTORY:  Social Connections: Not on file    REVIEW OF SYSTEMS:  + Fatigue, migraines, easy bruising Denies appetite changes, fevers, chills, unexplained weight changes. Denies hearing loss, neck lumps or masses, mouth sores, ringing in ears or voice changes. Denies cough or wheezing.  Denies shortness of breath. Denies chest pain or palpitations. Denies leg swelling. Denies abdominal distention, pain, blood in stools, constipation, diarrhea, nausea, vomiting, or early satiety. Denies pain with intercourse, dysuria, frequency, hematuria or incontinence. Denies hot flashes, pelvic pain, vaginal bleeding or vaginal discharge.   Denies joint pain, back pain or muscle pain/cramps. Denies itching, rash, or wounds. Denies dizziness, numbness or seizures. Denies swollen lymph nodes or glands. Denies anxiety, depression, confusion, or decreased concentration.  Physical Exam:  Vital Signs for this encounter:  Blood pressure 123/87, pulse 92, temperature 98.1 F (36.7 C), temperature source Oral,  resp. rate 18, height 5' 2.99" (1.6 m), weight 119 lb 6.4 oz (54.2 kg), SpO2 100 %. Body mass index is 21.16 kg/m. General: Alert, oriented, no acute distress.  HEENT: Normocephalic, atraumatic. Sclera anicteric.  Chest: Clear to auscultation bilaterally. No wheezes, rhonchi, or rales. Cardiovascular: Regular rate and rhythm, no murmurs, rubs, or gallops.  Abdomen: Normoactive bowel sounds. Soft, nondistended, nontender to palpation. No masses or hepatosplenomegaly appreciated. No palpable fluid wave.  Extremities: Grossly normal range of motion. Warm, well perfused. No edema bilaterally.  Skin: No rashes or lesions.  Lymphatics: No cervical, supraclavicular, or inguinal adenopathy.  GU:  Normal external female genitalia. No lesions. No discharge or bleeding.  Bladder/urethra:  No lesions or masses, well supported bladder             Vagina: Well-rugated, no lesions or masses.             Cervix: Normal appearing, no lesions.             Uterus: Small, mobile, no parametrial involvement or nodularity.             Adnexa: No masses appreciated.  Rectal: Deferred.  LABORATORY AND RADIOLOGIC DATA:  Outside medical records were reviewed to synthesize the above history, along with the history and physical obtained during the visit.   Lab Results  Component Value Date   WBC 10.4 06/03/2022   HGB 11.5 (L) 06/03/2022   HCT 34.2 (L) 06/03/2022   PLT 287 06/03/2022   GLUCOSE 93 06/03/2022   ALT 11 06/03/2022   AST 15 06/03/2022   NA 140 06/03/2022   K 4.0 06/03/2022   CL 105 06/03/2022   CREATININE 0.53 06/03/2022   BUN 17 06/03/2022   CO2 29 06/03/2022

## 2022-06-18 ENCOUNTER — Other Ambulatory Visit: Payer: Self-pay

## 2022-06-18 ENCOUNTER — Inpatient Hospital Stay: Payer: Commercial Managed Care - HMO

## 2022-06-18 ENCOUNTER — Inpatient Hospital Stay (HOSPITAL_BASED_OUTPATIENT_CLINIC_OR_DEPARTMENT_OTHER): Payer: Commercial Managed Care - HMO | Admitting: Gynecologic Oncology

## 2022-06-18 ENCOUNTER — Encounter: Payer: Self-pay | Admitting: Gynecologic Oncology

## 2022-06-18 VITALS — BP 123/87 | HR 92 | Temp 98.1°F | Resp 18 | Ht 62.99 in | Wt 119.4 lb

## 2022-06-18 DIAGNOSIS — Z8041 Family history of malignant neoplasm of ovary: Secondary | ICD-10-CM

## 2022-06-18 DIAGNOSIS — Z1509 Genetic susceptibility to other malignant neoplasm: Secondary | ICD-10-CM

## 2022-06-18 DIAGNOSIS — Z1501 Genetic susceptibility to malignant neoplasm of breast: Secondary | ICD-10-CM

## 2022-06-18 DIAGNOSIS — Z171 Estrogen receptor negative status [ER-]: Secondary | ICD-10-CM

## 2022-06-18 DIAGNOSIS — Z1502 Genetic susceptibility to malignant neoplasm of ovary: Secondary | ICD-10-CM | POA: Diagnosis not present

## 2022-06-18 DIAGNOSIS — Z800 Family history of malignant neoplasm of digestive organs: Secondary | ICD-10-CM

## 2022-06-18 DIAGNOSIS — C50512 Malignant neoplasm of lower-outer quadrant of left female breast: Secondary | ICD-10-CM | POA: Diagnosis not present

## 2022-06-18 DIAGNOSIS — Z803 Family history of malignant neoplasm of breast: Secondary | ICD-10-CM

## 2022-06-18 NOTE — Patient Instructions (Addendum)
It was very nice to meet you today. We will get your surgery scheduled for August with a preop within several weeks of that date.   We discussed getting a CA-125 today (blood test) and scheduling you for a pelvic ultrasound. I will let you know when I get these results.  We will tentatively plan for surgery in September 15, 2022 at [facility] with Dr. Eugene Garnet. We will see you back in the office closer to the date for a preop appointment with Warner Mccreedy NP to discuss the instructions for before and after surgery. The surgery date is subject to change based on on availability. We will keep you posted on this.  You may also receive a phone call from the hospital to arrange for a pre-op appointment there as well. Usually both appointments can be combined on the same day.

## 2022-06-19 ENCOUNTER — Other Ambulatory Visit: Payer: Self-pay

## 2022-06-20 ENCOUNTER — Encounter: Payer: Self-pay | Admitting: Hematology and Oncology

## 2022-06-21 ENCOUNTER — Ambulatory Visit: Payer: Commercial Managed Care - HMO | Admitting: Psychiatry

## 2022-06-21 LAB — CA 125: Cancer Antigen (CA) 125: 7.2 U/mL (ref 0.0–38.1)

## 2022-06-22 ENCOUNTER — Encounter: Payer: Self-pay | Admitting: Plastic Surgery

## 2022-06-24 NOTE — Progress Notes (Signed)
Could someone please call and let her know CA-125 was normal? Thanks!

## 2022-06-25 ENCOUNTER — Ambulatory Visit (HOSPITAL_COMMUNITY)
Admission: RE | Admit: 2022-06-25 | Discharge: 2022-06-25 | Disposition: A | Discharge status: Home / Self Care | Payer: Commercial Managed Care - HMO | Source: Ambulatory Visit | Attending: Gynecologic Oncology | Admitting: Gynecologic Oncology

## 2022-06-25 ENCOUNTER — Telehealth: Payer: Self-pay | Admitting: *Deleted

## 2022-06-25 DIAGNOSIS — Z1501 Genetic susceptibility to malignant neoplasm of breast: Secondary | ICD-10-CM | POA: Insufficient documentation

## 2022-06-25 DIAGNOSIS — Z1509 Genetic susceptibility to other malignant neoplasm: Secondary | ICD-10-CM | POA: Insufficient documentation

## 2022-06-25 NOTE — Telephone Encounter (Signed)
-----   Message from Carver Fila, MD sent at 06/24/2022  5:49 PM EDT ----- Could someone please call and let her know CA-125 was normal? Thanks!

## 2022-06-25 NOTE — Telephone Encounter (Signed)
Spoke with Julie Townsend and relayed message from Dr. Eugene Garnet, Pt's CA 125 is normal. Pt to keep her pelvic ultrasound appointment today.   No further questions or concerns at this time.

## 2022-06-30 NOTE — Progress Notes (Signed)
Would you please call this patient to review ultrasound results (see my note at bottom of the report)? Thank you

## 2022-07-01 ENCOUNTER — Encounter: Payer: Self-pay | Admitting: Hematology and Oncology

## 2022-07-01 ENCOUNTER — Telehealth: Payer: Self-pay | Admitting: Pharmacy Technician

## 2022-07-01 ENCOUNTER — Telehealth: Payer: Self-pay | Admitting: *Deleted

## 2022-07-01 ENCOUNTER — Other Ambulatory Visit (HOSPITAL_COMMUNITY): Payer: Self-pay

## 2022-07-01 NOTE — Telephone Encounter (Signed)
-----   Message from Carver Fila, MD sent at 06/30/2022  3:18 PM EDT ----- Would you please call this patient to review ultrasound results (see my note at bottom of the report)? Thank you

## 2022-07-01 NOTE — Telephone Encounter (Signed)
Notified Julie Townsend per Dr. Winferd Humphrey message. Right ovary was not able to be seen (usually a good sign if its so small they have trouble finding it). Left ovary was normal in appearance. Patient thanked the office for calling with her ultrasound results.  Pt has no further questions or concerns at this time.

## 2022-07-01 NOTE — Telephone Encounter (Signed)
Patient Advocate Encounter  Received notification from [employer] that prior authorization for [facility] 120MG  is required.   PA submitted on 5.30.24 Key [id number] Status is pending

## 2022-07-03 LAB — SIGNATERA ONLY (NATERA MANAGED)
SIGNATERA MTM READOUT: 0 MTM/ml
SIGNATERA TEST RESULT: NEGATIVE

## 2022-07-06 ENCOUNTER — Encounter: Payer: Self-pay | Admitting: Hematology and Oncology

## 2022-07-06 NOTE — Telephone Encounter (Signed)
Attempted to call pt regarding [employer] results lvm that results was negative and [employer] will be contacting her in the next 3 months to repeat labs.

## 2022-07-07 ENCOUNTER — Encounter (HOSPITAL_COMMUNITY): Payer: Self-pay

## 2022-07-09 ENCOUNTER — Telehealth: Payer: Self-pay | Admitting: Pharmacy Technician

## 2022-07-09 NOTE — Telephone Encounter (Signed)
Patient Advocate Encounter  Prior Authorization for Bernita Raisin 100MG  tablets has been approved.    PA# [id number] Key: [facility] Insurance [employer] Electronic PA Form Effective dates: 07/09/2022 through 07/09/2023

## 2022-07-13 ENCOUNTER — Encounter: Payer: Self-pay | Admitting: Physician Assistant

## 2022-07-13 ENCOUNTER — Ambulatory Visit (INDEPENDENT_AMBULATORY_CARE_PROVIDER_SITE_OTHER): Payer: Commercial Managed Care - HMO | Admitting: Physician Assistant

## 2022-07-13 VITALS — BP 119/84 | HR 88 | Ht 62.0 in | Wt 113.0 lb

## 2022-07-13 DIAGNOSIS — Z95828 Presence of other vascular implants and grafts: Secondary | ICD-10-CM

## 2022-07-13 DIAGNOSIS — Z171 Estrogen receptor negative status [ER-]: Secondary | ICD-10-CM

## 2022-07-13 DIAGNOSIS — C50512 Malignant neoplasm of lower-outer quadrant of left female breast: Secondary | ICD-10-CM

## 2022-07-13 MED ORDER — SULFAMETHOXAZOLE-TRIMETHOPRIM 800-160 MG PO TABS: 1.0000 | ORAL_TABLET | Freq: Two times a day (BID) | ORAL | 0 refills | Status: AC

## 2022-07-13 MED ORDER — OXYCODONE HCL 5 MG PO TABS: 5.0000 mg | ORAL_TABLET | Freq: Four times a day (QID) | ORAL | 0 refills | Status: AC | PRN

## 2022-07-13 NOTE — H&P (View-Only) (Signed)
   Patient ID: Julie Townsend, female    DOB: 08/28/1965, 57 y.o.   MRN: [id number]  Chief Complaint  Patient presents with   Pre-op Exam      ICD-10-CM   1. Port-A-Cath in place  Z95.828     2. Malignant neoplasm of lower-outer quadrant of left breast of female, estrogen receptor negative (HCC)  C50.512    Z17.1        History of Present Illness: Julie Townsend is a 57 y.o.  female  with a history of left-sided breast cancer s/p bilateral mastectomy with immediate reconstruction using tissue expander and Flex HD performed 10/29/2021 and subsequent right-sided infection and expander removal 12/17/2021 by Dr. Dillingham.  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  She presents for preoperative evaluation for upcoming procedure, right breast reconstruction latissimus muscle flap and expander placement as well as removal of Port-A-Cath, scheduled for 07/26/2022 with Dr. Dillingham.  The patient has not had problems with anesthesia.  Previous surgeries without complication.  Denies any personal or family history of blood clots or clotting disorder.  No varicosities.  She is not on any anticoagulation and does not use any nicotine-containing products.  She tells me that she is already about as large as she wants to be on the left side and she only has approximately 100 cc in the expander.  She believes that Dr. Blackman will be doing the Port-A-Cath removal at the time of her dismiss dorsi flap reconstruction.  She is accompanied by her husband at bedside.  Discussed expectations regarding surgery as well as plan to stay overnight at [facility] for observation and pain control.  They inquire about use of a long-term anesthetic (Exparel?) as that had helped considerably with previous surgery.  She believes that Dr. Blackman will be removing the Port-A-Cath.  Summary of Previous Visit: She was last seen here in clinic on 06/04/2022 Dr. Dillingham.   At that time, she was doing well after right side expander removal.  She expressed that she would like to proceed with the latissimus flap and expander based reconstruction.  She was due for her last chemotherapy treatment that week and plan would be for surgery a month later.  Plan will also be for removal of the Port-A-Cath at the same time.  PMH Significant for: Left-sided breast cancer s/p bilateral mastectomy with immediate expander based reconstruction 10/29/2021 complicated by seromas leading to expander removals, chemotherapy treatments, carpal tunnel syndrome.   Past Medical History: Allergies: No Known Allergies  Current Medications:  Current Outpatient Medications:    Galcanezumab-gnlm (EMGALITY) 120 MG/ML SOAJ, Inject 120 mg into the skin every 28 (twenty-eight) days., Disp: 1 mL, Rfl: 3   nortriptyline (PAMELOR) 10 MG capsule, Take 1 capsule (10 mg total) by mouth at bedtime., Disp: 30 capsule, Rfl: 5   oxyCODONE (ROXICODONE) 5 MG immediate release tablet, Take 1 tablet (5 mg total) by mouth every 6 (six) hours as needed for up to 5 days for severe pain., Disp: 20 tablet, Rfl: 0   sulfamethoxazole-trimethoprim (BACTRIM DS) 800-160 MG tablet, Take 1 tablet by mouth 2 (two) times daily for 14 days., Disp: 28 tablet, Rfl: 0   SUMAtriptan (IMITREX) 50 MG tablet, TAKE 1 TAB BY MOUTH AS NEEDED FOR MIGRAINE. MAX 2 TABS IN 24 HOURS. MAY REPEAT IN 2 HOURS IF HEADACHE PERSISTS OR RECURS., Disp: 30 tablet, Rfl: 1   Ubrogepant (UBRELVY) 100 MG TABS, TAKE AS NEEDED (  TAKE 1 AT THE EARLIST ONSET OF A MIGRAINE. MAY REPEAT IN 2 HOURS. MAX 2/24 HOURS)., Disp: 16 tablet, Rfl: 3  Past Medical Problems: Past Medical History:  Diagnosis Date   BRCA2 gene mutation positive 09/26/2021   Breast cancer (HCC) 09/2021   left breast IDC   Family history of breast cancer 09/22/2021   Family history of ovarian cancer 09/22/2021   Family history of pancreatic cancer 09/22/2021   Genetic testing 09/26/2021    Pathogenic variant in BRCA2 at p.E1308* (c.3922G>T).  Report date is September 26, 2021.    The BRCAplus panel offered by [employer] and includes sequencing and deletion/duplication analysis for the following 8 genes: ATM, BRCA1, BRCA2, CDH1, CHEK2, PALB2, PTEN, and TP53.  Results of pan-cancer panel are pending.     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    correct Hands    Past Surgical History: Past Surgical History:  Procedure Laterality Date   BREAST IMPLANT REMOVAL Right 12/17/2021   Procedure: Right breast expander removal with washout;  Surgeon: Dillingham, Claire S, DO;  Location: [facility];  Service: Plastics;  Laterality: Right;   BREAST RECONSTRUCTION WITH PLACEMENT OF TISSUE EXPANDER AND FLEX HD (ACELLULAR HYDRATED DERMIS) Bilateral 10/29/2021   Procedure: BREAST RECONSTRUCTION WITH PLACEMENT OF TISSUE EXPANDER AND FLEX HD (ACELLULAR HYDRATED DERMIS);  Surgeon: Dillingham, Claire S, DO;  Location: [facility];  Service: Plastics;  Laterality: Bilateral;   CARPAL TUNNEL RELEASE Right 06/20/2020   Procedure: CARPAL TUNNEL RELEASE;  Surgeon: Kuzma, Kevin, MD;  Location: [facility];  Service: Orthopedics;  Laterality: Right;   DILATION AND CURETTAGE OF UTERUS     IRRIGATION AND DEBRIDEMENT OF WOUND WITH SPLIT THICKNESS SKIN GRAFT Right 03/15/2022   Procedure: Debridement of right breast;  Surgeon: Dillingham, Claire S, DO;  Location: [facility];  Service: Plastics;  Laterality: Right;   MASTECTOMY W/ SENTINEL NODE BIOPSY Left 10/29/2021   Procedure: LEFT MASTECTOMY WITH LEFT SENTINEL LYMPH NODE BIOPSY;  Surgeon: Blackman, Douglas, MD;  Location: [facility];  Service: General;  Laterality: Left;   PORTACATH PLACEMENT Right 10/29/2021   Procedure: INSERTION PORT-A-CATH;  Surgeon: Blackman, Douglas, MD;  Location: [facility];  Service: General;  Laterality: Right;   TOTAL MASTECTOMY Right  10/29/2021   Procedure: RIGHT TOTAL MASTECTOMY;  Surgeon: Blackman, Douglas, MD;  Location: [facility];  Service: General;  Laterality: Right;   TRIGGER FINGER RELEASE Left 05/08/2020   Procedure: LEFT LONG FINGER TRIGGER RELEASE AND LEFT RING FINGER TRIGGER RELEASE;  Surgeon: Kuzma, Kevin, MD;  Location: 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     Right handed   Social Determinants of Health   Financial Resource Strain: Not on file  Food Insecurity: Not on file  Transportation Needs: Not on file  Physical Activity: Not on file  Stress: Not on file  Social Connections: Not on file  Intimate Partner Violence: Not on file    Family History: Family History  Problem Relation Age of Onset   Migraines Mother    Breast cancer Mother 56       recurr at  65   Migraines Maternal Aunt    Colon cancer Maternal Uncle        dx late 60s   Pancreatic cancer Maternal Uncle 28   Migraines Maternal Grandmother    Ovarian cancer Maternal Grandmother        dx 50s    Review of Systems: ROS  Physical Exam: Vital Signs BP 119/84 (BP Location: Right Arm, Patient Position: Sitting, Cuff Size: Normal)   Pulse 88   Ht 5' 2" (1.575 m)   Wt 113 lb (51.3 kg)   SpO2 97%   BMI  20.67 kg/m   Physical Exam Constitutional:      General: Not in acute distress.    Appearance: Normal appearance. Not ill-appearing.  HENT:     Head: Normocephalic and atraumatic.  Eyes:     Pupils: Pupils are equal, round. Cardiovascular:     Rate and Rhythm: Normal rate.    Pulses: Normal pulses.  Pulmonary:     Effort: No respiratory distress or increased work of breathing.  Speaks in full sentences. Abdominal:     General: Abdomen is flat. No distension.   Musculoskeletal: Normal range of motion. No lower extremity swelling or edema. No varicosities. Skin:    General: Skin is warm and dry.     Findings: No erythema or rash.  Neurological:     Mental Status: Alert and oriented to person, place, and time.  Psychiatric:        Mood and Affect: Mood normal.        Behavior: Behavior normal.    Assessment/Plan: The patient is scheduled for right breast reconstruction latissimus muscle flap and expander placement as well as removal of Port-A-Cath with Dr. Dillingham.  Risks, benefits, and alternatives of procedure discussed, questions answered and consent obtained.    Smoking Status: Non-smoker. Last Mammogram: 08/31/2021; Results: Negative on right breast, BI-RADS Category 4: Suspicious on left breast.  Now s/p bilateral mastectomy.  Caprini Score: 7; Risk Factors include: Age, left-sided breast cancer, current Port-A-Cath (being removed), and length of planned surgery. Recommendation for mechanical and possibly pharmacological prophylaxis.  Will discuss with Dr. Dillingham and prescribe Lovenox if indicated, but either way we will encourage early ambulation.   Pictures obtained: 05/04/2022  Post-op Rx sent to pharmacy: Oxycodone and Bactrim.  She reports ample Zofran at home.  Patient was provided with the General Surgical Risk consent document and Pain Medication Agreement prior to their appointment.  They had adequate time to read through the risk consent documents and Pain  Medication Agreement. We also discussed them in person together during this preop appointment. All of their questions were answered to their satisfaction.  Recommended calling if they have any further questions.  Risk consent form and Pain Medication Agreement to be scanned into patient's chart.  The risks that can be encountered with and after placement of a breast expander placement were discussed and include the following but not limited to these: bleeding, infection, delayed healing, anesthesia risks, skin sensation   changes, injury to structures including nerves, blood vessels, and muscles which may be temporary or permanent, allergies to tape, suture materials and glues, blood products, topical preparations or injected agents, skin contour irregularities, skin discoloration and swelling, deep vein thrombosis, cardiac and pulmonary complications, pain, which may persist, fluid accumulation, wrinkling of the skin over the expander, changes in nipple or breast sensation, expander leakage or rupture, faulty position of the expander, persistent pain, formation of tight scar tissue around the expander (capsular contracture), possible need for revisional surgery or staged procedures.    Electronically signed by: Elexa Kivi, PA-C 07/13/2022 3:49 PM 

## 2022-07-13 NOTE — Progress Notes (Signed)
Patient ID: Julie Townsend, female    DOB: [birth year], 57 y.o.   MRN: [id number]  Chief Complaint  Patient presents with   Pre-op Exam      ICD-10-CM   1. Port-A-Cath in place  Z95.828     2. Malignant neoplasm of lower-outer quadrant of left breast of female, estrogen receptor negative (HCC)  C50.512    Z17.1        History of Present Illness: Julie Townsend is a 57 y.o.  female  with a history of left-sided breast cancer s/p bilateral mastectomy with immediate reconstruction using tissue expander and Flex HD performed 10/29/2021 and subsequent right-sided infection and expander removal 12/17/2021 by Dr. Ulice Bold.  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  She presents for preoperative evaluation for upcoming procedure, right breast reconstruction latissimus muscle flap and expander placement as well as removal of Port-A-Cath, scheduled for 07/26/2022 with Dr. Ulice Bold.  The patient has not had problems with anesthesia.  Previous surgeries without complication.  Denies any personal or family history of blood clots or clotting disorder.  No varicosities.  She is not on any anticoagulation and does not use any nicotine-containing products.  She tells me that she is already about as large as she wants to be on the left side and she only has approximately 100 cc in the expander.  She believes that Dr. Magnus Ivan will be doing the Port-A-Cath removal at the time of her dismiss dorsi flap reconstruction.  She is accompanied by her husband at bedside.  Discussed expectations regarding surgery as well as plan to stay overnight at [facility] for observation and pain control.  They inquire about use of a long-term anesthetic (Exparel?) as that had helped considerably with previous surgery.  She believes that Dr. Magnus Ivan will be removing the Port-A-Cath.  Summary of Previous Visit: She was last seen here in clinic on 06/04/2022 Dr. Ulice Bold.   At that time, she was doing well after right side expander removal.  She expressed that she would like to proceed with the latissimus flap and expander based reconstruction.  She was due for her last chemotherapy treatment that week and plan would be for surgery a month later.  Plan will also be for removal of the Port-A-Cath at the same time.  PMH Significant for: Left-sided breast cancer s/p bilateral mastectomy with immediate expander based reconstruction 10/29/2021 complicated by seromas leading to expander removals, chemotherapy treatments, carpal tunnel syndrome.   Past Medical History: Allergies: No Known Allergies  Current Medications:  Current Outpatient Medications:    Galcanezumab-gnlm (EMGALITY) 120 MG/ML SOAJ, Inject 120 mg into the skin every 28 (twenty-eight) days., Disp: 1 mL, Rfl: 3   nortriptyline (PAMELOR) 10 MG capsule, Take 1 capsule (10 mg total) by mouth at bedtime., Disp: 30 capsule, Rfl: 5   oxyCODONE (ROXICODONE) 5 MG immediate release tablet, Take 1 tablet (5 mg total) by mouth every 6 (six) hours as needed for up to 5 days for severe pain., Disp: 20 tablet, Rfl: 0   sulfamethoxazole-trimethoprim (BACTRIM DS) 800-160 MG tablet, Take 1 tablet by mouth 2 (two) times daily for 14 days., Disp: 28 tablet, Rfl: 0   SUMAtriptan (IMITREX) 50 MG tablet, TAKE 1 TAB BY MOUTH AS NEEDED FOR MIGRAINE. MAX 2 TABS IN 24 HOURS. MAY REPEAT IN 2 HOURS IF HEADACHE PERSISTS OR RECURS., Disp: 30 tablet, Rfl: 1   Ubrogepant (UBRELVY) 100 MG TABS, TAKE AS NEEDED (  TAKE 1 AT THE EARLIST ONSET OF A MIGRAINE. MAY REPEAT IN 2 HOURS. MAX 2/24 HOURS)., Disp: 16 tablet, Rfl: 3  Past Medical Problems: Past Medical History:  Diagnosis Date   BRCA2 gene mutation positive 09/26/2021   Breast cancer (HCC) 09/2021   left breast IDC   Family history of breast cancer 09/22/2021   Family history of ovarian cancer 09/22/2021   Family history of pancreatic cancer 09/22/2021   Genetic testing 09/26/2021    Pathogenic variant in BRCA2 at p.W0981* (c.3922G>T).  Report date is September 26, 2021.    The BRCAplus panel offered by [employer] and includes sequencing and deletion/duplication analysis for the following 8 genes: ATM, BRCA1, BRCA2, CDH1, CHEK2, PALB2, PTEN, and TP53.  Results of pan-cancer panel are pending.     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    correct Hands    Past Surgical History: Past Surgical History:  Procedure Laterality Date   BREAST IMPLANT REMOVAL Right 12/17/2021   Procedure: Right breast expander removal with washout;  Surgeon: Peggye Form, DO;  Location: [facility];  Service: Plastics;  Laterality: Right;   BREAST RECONSTRUCTION WITH PLACEMENT OF TISSUE EXPANDER AND FLEX HD (ACELLULAR HYDRATED DERMIS) Bilateral 10/29/2021   Procedure: BREAST RECONSTRUCTION WITH PLACEMENT OF TISSUE EXPANDER AND FLEX HD (ACELLULAR HYDRATED DERMIS);  Surgeon: Peggye Form, DO;  Location: [facility];  Service: Plastics;  Laterality: Bilateral;   CARPAL TUNNEL RELEASE Right 06/20/2020   Procedure: CARPAL TUNNEL RELEASE;  Surgeon: Betha Loa, MD;  Location: [facility];  Service: Orthopedics;  Laterality: Right;   DILATION AND CURETTAGE OF UTERUS     IRRIGATION AND DEBRIDEMENT OF WOUND WITH SPLIT THICKNESS SKIN GRAFT Right 03/15/2022   Procedure: Debridement of right breast;  Surgeon: Peggye Form, DO;  Location: [facility];  Service: Plastics;  Laterality: Right;   MASTECTOMY W/ SENTINEL NODE BIOPSY Left 10/29/2021   Procedure: LEFT MASTECTOMY WITH LEFT SENTINEL LYMPH NODE BIOPSY;  Surgeon: Abigail Miyamoto, MD;  Location: [facility];  Service: General;  Laterality: Left;   PORTACATH PLACEMENT Right 10/29/2021   Procedure: INSERTION PORT-A-CATH;  Surgeon: Abigail Miyamoto, MD;  Location: [facility];  Service: General;  Laterality: Right;   TOTAL MASTECTOMY Right  10/29/2021   Procedure: RIGHT TOTAL MASTECTOMY;  Surgeon: Abigail Miyamoto, MD;  Location: [facility];  Service: General;  Laterality: Right;   TRIGGER FINGER RELEASE Left 05/08/2020   Procedure: LEFT LONG FINGER TRIGGER RELEASE AND LEFT RING FINGER TRIGGER RELEASE;  Surgeon: Betha Loa, MD;  Location: [facility];  Service: Orthopedics;  Laterality: Left;   trigger finger release rt hand      Social History: Social History   Socioeconomic History   Marital status: Married    Spouse name: Not on file   Number of children: Not on file   Years of education: Not on file   Highest education level: Not on file  Occupational History   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  Right handed   Social Determinants of Health   Financial Resource Strain: Not on file  Food Insecurity: Not on file  Transportation Needs: Not on file  Physical Activity: Not on file  Stress: Not on file  Social Connections: Not on file  Intimate Partner Violence: Not on file    Family History: Family History  Problem Relation Age of Onset   Migraines Mother    Breast cancer Mother 40       recurr at  61   Migraines Maternal Aunt    Colon cancer Maternal Uncle        dx late 56s   Pancreatic cancer Maternal Uncle 28   Migraines Maternal Grandmother    Ovarian cancer Maternal Grandmother        dx 23s    Review of Systems: ROS  Physical Exam: Vital Signs BP 119/84 (BP Location: Right Arm, Patient Position: Sitting, Cuff Size: Normal)   Pulse 88   Ht 5\' 2"  (1.575 m)   Wt 113 lb (51.3 kg)   SpO2 97%   BMI  20.67 kg/m   Physical Exam Constitutional:      General: Not in acute distress.    Appearance: Normal appearance. Not ill-appearing.  HENT:     Head: Normocephalic and atraumatic.  Eyes:     Pupils: Pupils are equal, round. Cardiovascular:     Rate and Rhythm: Normal rate.    Pulses: Normal pulses.  Pulmonary:     Effort: No respiratory distress or increased work of breathing.  Speaks in full sentences. Abdominal:     General: Abdomen is flat. No distension.   Musculoskeletal: Normal range of motion. No lower extremity swelling or edema. No varicosities. Skin:    General: Skin is warm and dry.     Findings: No erythema or rash.  Neurological:     Mental Status: Alert and oriented to person, place, and time.  Psychiatric:        Mood and Affect: Mood normal.        Behavior: Behavior normal.    Assessment/Plan: The patient is scheduled for right breast reconstruction latissimus muscle flap and expander placement as well as removal of Port-A-Cath with Dr. Ulice Bold.  Risks, benefits, and alternatives of procedure discussed, questions answered and consent obtained.    Smoking Status: Non-smoker. Last Mammogram: 08/31/2021; Results: Negative on right breast, BI-RADS Category 4: Suspicious on left breast.  Now s/p bilateral mastectomy.  Caprini Score: 7; Risk Factors include: Age, left-sided breast cancer, current Port-A-Cath (being removed), and length of planned surgery. Recommendation for mechanical and possibly pharmacological prophylaxis.  Will discuss with Dr. Ulice Bold and prescribe Lovenox if indicated, but either way we will encourage early ambulation.   Pictures obtained: 05/04/2022  Post-op Rx sent to pharmacy: Oxycodone and Bactrim.  She reports ample Zofran at home.  Patient was provided with the General Surgical Risk consent document and Pain Medication Agreement prior to their appointment.  They had adequate time to read through the risk consent documents and Pain  Medication Agreement. We also discussed them in person together during this preop appointment. All of their questions were answered to their satisfaction.  Recommended calling if they have any further questions.  Risk consent form and Pain Medication Agreement to be scanned into patient's chart.  The risks that can be encountered with and after placement of a breast expander placement were discussed and include the following but not limited to these: bleeding, infection, delayed healing, anesthesia risks, skin sensation  changes, injury to structures including nerves, blood vessels, and muscles which may be temporary or permanent, allergies to tape, suture materials and glues, blood products, topical preparations or injected agents, skin contour irregularities, skin discoloration and swelling, deep vein thrombosis, cardiac and pulmonary complications, pain, which may persist, fluid accumulation, wrinkling of the skin over the expander, changes in nipple or breast sensation, expander leakage or rupture, faulty position of the expander, persistent pain, formation of tight scar tissue around the expander (capsular contracture), possible need for revisional surgery or staged procedures.    Electronically signed by: Evelena Leyden, PA-C 07/13/2022 3:49 PM

## 2022-07-14 ENCOUNTER — Telehealth: Payer: Self-pay

## 2022-07-14 NOTE — Telephone Encounter (Signed)
*  [facility]   PA request received for Emgality 120MG /ML syringes (migraine)  PA submitted to [employer] via [facility] and is pending additional questions/determination  Key: [id number]  Past NSAIDS/analgesics:  Sprix NS, Fioricet, Excedrin Migraine  Past abortive triptans:  Axert, Maxalt, Relpax, Zomig  Past abortive ergotamine:  none  Past muscle relaxants:  none Past anti-emetic:  none  Past antihypertensive medications:  verapamil Past antidepressant medications:  amitriptyline Past anticonvulsant medications:  Topiramate, Depakote  Past anti-CGRP:  Aimovig, Nurtec (rescue)  Past vitamins/Herbal/Supplements:  Mg, B2 Past antihistamines/decongestants:  none  Other past therapies:  none

## 2022-07-15 ENCOUNTER — Other Ambulatory Visit: Payer: Self-pay

## 2022-07-15 ENCOUNTER — Encounter: Payer: Self-pay | Admitting: Emergency Medicine

## 2022-07-15 ENCOUNTER — Inpatient Hospital Stay (HOSPITAL_COMMUNITY)
Admission: RE | Admit: 2022-07-15 | Discharge: 2022-07-15 | Disposition: A | Discharge status: Home / Self Care | Payer: Commercial Managed Care - HMO | Source: Ambulatory Visit

## 2022-07-15 ENCOUNTER — Emergency Department
Admission: EM | Admit: 2022-07-15 | Discharge: 2022-07-15 | Disposition: A | Discharge status: Home / Self Care | Payer: Commercial Managed Care - HMO | Attending: Emergency Medicine | Admitting: Emergency Medicine

## 2022-07-15 DIAGNOSIS — T7840XA Allergy, unspecified, initial encounter: Secondary | ICD-10-CM | POA: Diagnosis present

## 2022-07-15 DIAGNOSIS — Z853 Personal history of malignant neoplasm of breast: Secondary | ICD-10-CM | POA: Insufficient documentation

## 2022-07-15 DIAGNOSIS — T782XXA Anaphylactic shock, unspecified, initial encounter: Secondary | ICD-10-CM | POA: Diagnosis not present

## 2022-07-15 MED ORDER — FAMOTIDINE IN NACL 20-0.9 MG/50ML-% IV SOLN
20.0000 mg | Freq: Once | INTRAVENOUS | Status: AC
  Administered 2022-07-15: 20 mg via INTRAVENOUS
  Filled 2022-07-15: qty 50

## 2022-07-15 MED ORDER — PREDNISONE 50 MG PO TABS: 50.0000 mg | ORAL_TABLET | Freq: Every day | ORAL | 0 refills | Status: DC

## 2022-07-15 MED ORDER — METHYLPREDNISOLONE SODIUM SUCC 125 MG IJ SOLR
125.0000 mg | Freq: Once | INTRAMUSCULAR | Status: AC
  Administered 2022-07-15: 125 mg via INTRAVENOUS
  Filled 2022-07-15: qty 2

## 2022-07-15 MED ORDER — EPINEPHRINE 0.3 MG/0.3ML IJ SOAJ: 0.3000 mg | INTRAMUSCULAR | 1 refills | Status: AC | PRN

## 2022-07-15 NOTE — Progress Notes (Signed)
Surgical Instructions    Your procedure is scheduled on June 24,2024.  Report to [facility] Main Entrance "A" at 10:15 A.M., then check in with the Admitting office.  Call this number if you have problems the morning of surgery:  [phone]   If you have any questions prior to your surgery date call [phone]: Open Monday-Friday 8am-4pm If you experience any cold or flu symptoms such as cough, fever, chills, shortness of breath, etc. between now and your scheduled surgery, please notify us at the above number     Remember:  Do not eat after midnight the night before your surgery  You may drink clear liquids until 9:15am the morning of your surgery.   Clear liquids allowed are: Water, Non-Citrus Juices (without pulp), Carbonated Beverages, Clear Tea, Black Coffee ONLY (NO MILK, CREAM OR POWDERED CREAMER of any kind), and Gatorade    Take these medicines the morning of surgery with A SIP OF WATER:  sulfamethoxazole-trimethoprim (BACTRIM DS)  oxyCODONE (ROXICODONE) if needed SUMAtriptan (IMITREX) if needed Ubrogepant (UBRELVY) if needed  As of today, STOP taking any Aspirin (unless otherwise instructed by your surgeon) Aleve, Naproxen, Ibuprofen, Motrin, Advil, Goody's, BC's, all herbal medications, fish oil, and all vitamins.           Do not wear jewelry or makeup. Do not wear lotions, powders, perfumes/cologne or deodorant. Do not shave 48 hours prior to surgery.  Men may shave face and neck. Do not bring valuables to the hospital. Do not wear nail polish, gel polish, artificial nails, or any other type of covering on natural nails (fingers and toes) If you have artificial nails or gel coating that need to be removed by a nail salon, please have this removed prior to surgery. Artificial nails or gel coating may interfere with anesthesia's ability to adequately monitor your vital signs.  [facility] is not responsible for any belongings or valuables.    Do NOT Smoke  (Tobacco/Vaping)  24 hours prior to your procedure  If you use a CPAP at night, you may bring your mask for your overnight stay.   Contacts, glasses, hearing aids, dentures or partials may not be worn into surgery, please bring cases for these belongings   For patients admitted to the hospital, discharge time will be determined by your treatment team.   Patients discharged the day of surgery will not be allowed to drive home, and someone needs to stay with them for 24 hours.   SURGICAL WAITING ROOM VISITATION Patients having surgery or a procedure may have no more than 2 support people in the waiting area - these visitors may rotate.   Children under the age of 52 must have an adult with them who is not the patient. If the patient needs to stay at the hospital during part of their recovery, the visitor guidelines for inpatient rooms apply. Pre-op nurse will coordinate an appropriate time for 1 support person to accompany patient in pre-op.  This support person may not rotate.   Please refer to [email or web address] for the visitor guidelines for Inpatients (after your surgery is over and you are in a regular room).    Special instructions:    Oral Hygiene is also important to reduce your risk of infection.  Remember - BRUSH YOUR TEETH THE MORNING OF SURGERY WITH YOUR REGULAR TOOTHPASTE   [place]- Preparing For Surgery  Before surgery, you can play an important role. Because skin is not sterile, your skin needs to be as free  of germs as possible. You can reduce the number of germs on your skin by washing with CHG (chlorahexidine gluconate) Soap before surgery.  CHG is an antiseptic cleaner which kills germs and bonds with the skin to continue killing germs even after washing.     Please do not use if you have an allergy to CHG or antibacterial soaps. If your skin becomes reddened/irritated stop using the CHG.  Do not shave (including  legs and underarms) for at least 48 hours prior to first CHG shower. It is OK to shave your face.  Please follow these instructions carefully.     Shower the NIGHT BEFORE SURGERY and the MORNING OF SURGERY with CHG Soap.   If you chose to wash your hair, wash your hair first as usual with your normal shampoo. After you shampoo, rinse your hair and body thoroughly to remove the shampoo.  Then [employer] and genitals (private parts) with your normal soap and rinse thoroughly to remove soap.  After that Use CHG Soap as you would any other liquid soap. You can apply CHG directly to the skin and wash gently with a scrungie or a clean washcloth.   Apply the CHG Soap to your body ONLY FROM THE NECK DOWN.  Do not use on open wounds or open sores. Avoid contact with your eyes, ears, mouth and genitals (private parts). Wash Face and genitals (private parts)  with your normal soap.   Wash thoroughly, paying special attention to the area where your surgery will be performed.  Thoroughly rinse your body with warm water from the neck down.  DO NOT shower/wash with your normal soap after using and rinsing off the CHG Soap.  Pat yourself dry with a CLEAN TOWEL.  Wear CLEAN PAJAMAS to bed the night before surgery  Place CLEAN SHEETS on your bed the night before your surgery  DO NOT SLEEP WITH PETS.   Day of Surgery:  Take a shower with CHG soap. Wear Clean/Comfortable clothing the morning of surgery Do not apply any deodorants/lotions.   Remember to brush your teeth WITH YOUR REGULAR TOOTHPASTE.    If you received a COVID test during your pre-op visit, it is requested that you wear a mask when out in public, stay away from anyone that may not be feeling well, and notify your surgeon if you develop symptoms. If you have been in contact with anyone that has tested positive in the last 10 days, please notify your surgeon.    Please read over the following fact sheets that you were given.

## 2022-07-15 NOTE — ED Provider Notes (Signed)
[facility] Provider Note    Event Date/Time   First MD Initiated Contact with Patient 07/15/22 1239     (approximate)   History   Allergic Reaction (/)   HPI  Julie Townsend is a 57 y.o. female with a history of breast cancer, just finished chemotherapy last month who presents with allergic reaction/anaphylaxis.  Patient reports she was stung by bee, initially this causes localized inflammation but then she became itchy behind her ears and developed shortness of breath.  Received epinephrine injection and IV Benadryl from EMS.  Is feeling better now but still has slight rash.  History of anaphylaxis when she was 57 years old.     Physical Exam   Triage Vital Signs: ED Triage Vitals  Enc Vitals Group     BP 07/15/22 1242 (!) 137/95     Pulse Rate 07/15/22 1242 94     Resp 07/15/22 1242 18     Temp 07/15/22 1242 (!) 97.5 F (36.4 C)     Temp Source 07/15/22 1242 Oral     SpO2 07/15/22 1242 97 %     Weight 07/15/22 1239 51.7 kg (114 lb)     Height 07/15/22 1239 1.575 m (5\' 2" )     Head Circumference --      Peak Flow --      Pain Score 07/15/22 1239 3     Pain Loc --      Pain Edu? --      Excl. in GC? --     Most recent vital signs: Vitals:   07/15/22 1242 07/15/22 1525  BP: (!) 137/95 130/88  Pulse: 94 87  Resp: 18 18  Temp: (!) 97.5 F (36.4 C)   SpO2: 97% 98%     General: Awake, no distress.  CV:  Good peripheral perfusion.  Resp:  Normal effort.  No wheezing Abd:  No distention.  Other:  Urticaria noted to the extremities and back   ED Results / Procedures / Treatments   Labs (all labs ordered are listed, but only abnormal results are displayed) Labs Reviewed - No data to display   EKG     RADIOLOGY     PROCEDURES:  Critical Care performed: no  CRITICAL CARE    Procedures   MEDICATIONS ORDERED IN ED: Medications  methylPREDNISolone sodium succinate (SOLU-MEDROL) 125 mg/2 mL injection 125 mg  (125 mg Intravenous Given 07/15/22 1250)  famotidine (PEPCID) IVPB 20 mg premix (0 mg Intravenous Stopped 07/15/22 1524)     IMPRESSION / MDM / ASSESSMENT AND PLAN / ED COURSE  I reviewed the triage vital signs and the nursing notes. Patient's presentation is most consistent with acute presentation with potential threat to life or bodily function.  Patient presents after anaphylactic reaction status post epinephrine.  Will add on Solu-Medrol, Pepcid and monitor carefully.  ----------------------------------------- 2:17 PM on 07/15/2022 ----------------------------------------- Rash has resolved, patient is feeling well  ----------------------------------------- 3:32 PM on 07/15/2022 ----------------------------------------- Patient would like to be discharged, she continues to be well-appearing without rash, return precautions discussed, prescription for EpiPen and prednisone      FINAL CLINICAL IMPRESSION(S) / ED DIAGNOSES   Final diagnoses:  Anaphylaxis, initial encounter     Rx / DC Orders   ED Discharge Orders          Ordered    EPINEPHrine 0.3 mg/0.3 mL IJ SOAJ injection  As needed        07/15/22 1516    predniSONE (  DELTASONE) 50 MG tablet  Daily with breakfast        07/15/22 1516             Note:  This document was prepared using Dragon voice recognition software and may include unintentional dictation errors.   Jene Every, MD 07/15/22 [phone]

## 2022-07-15 NOTE — ED Triage Notes (Signed)
Presents via EMS   States she developed allergic rc s/p bee sting

## 2022-07-16 ENCOUNTER — Encounter (HOSPITAL_COMMUNITY)
Admit: 2022-07-16 | Discharge: 2022-07-16 | Disposition: A | Discharge status: Home / Self Care | Payer: Commercial Managed Care - HMO | Attending: Plastic Surgery | Admitting: Plastic Surgery

## 2022-07-16 ENCOUNTER — Other Ambulatory Visit: Payer: Self-pay

## 2022-07-16 ENCOUNTER — Encounter (HOSPITAL_COMMUNITY): Payer: Self-pay

## 2022-07-16 VITALS — BP 119/82 | HR 90 | Temp 98.4°F | Resp 17 | Ht 62.0 in | Wt 115.7 lb

## 2022-07-16 DIAGNOSIS — Z01812 Encounter for preprocedural laboratory examination: Secondary | ICD-10-CM | POA: Insufficient documentation

## 2022-07-16 DIAGNOSIS — Z01818 Encounter for other preprocedural examination: Secondary | ICD-10-CM

## 2022-07-16 LAB — BASIC METABOLIC PANEL
Anion gap: 10 (ref 5–15)
BUN: 16 mg/dL (ref 6–20)
CO2: 26 mmol/L (ref 22–32)
Calcium: 9.6 mg/dL (ref 8.9–10.3)
Chloride: 102 mmol/L (ref 98–111)
Creatinine, Ser: 0.76 mg/dL (ref 0.44–1.00)
GFR, Estimated: >60 mL/min (ref >60)
Glucose, Bld: 91 mg/dL (ref 70–99)
Potassium: 3.4 mmol/L — ABNORMAL LOW (ref 3.5–5.1)
Sodium: 138 mmol/L (ref 135–145)

## 2022-07-16 LAB — CBC
HCT: 38.8 % (ref 36.0–46.0)
Hemoglobin: 12.7 g/dL (ref 12.0–15.0)
MCH: 31.5 pg (ref 26.0–34.0)
MCHC: 32.7 g/dL (ref 30.0–36.0)
MCV: 96.3 fL (ref 80.0–100.0)
Platelets: 289 10*3/uL (ref 150–400)
RBC: 4.03 MIL/uL (ref 3.87–5.11)
RDW: 14.4 % (ref 11.5–15.5)
WBC: 8.6 10*3/uL (ref 4.0–10.5)
nRBC: 0 % (ref 0.0–0.2)

## 2022-07-16 NOTE — Progress Notes (Signed)
PCP - none Cardiologist - denies  PPM/ICD - denies Device Orders -  Rep Notified -   Chest x-ray - na EKG - na Stress Test - denies ECHO - 09/22/21 Cardiac Cath - denies  Sleep Study - denies CPAP - denies  Fasting Blood Sugar - na Checks Blood Sugar _____ times a day  Last dose of GLP1 agonist-  na GLP1 instructions: na  Blood Thinner Instructions:na Aspirin Instructions:na  ERAS Protcol -no PRE-SURGERY Ensure or G2-   COVID TEST- na   Anesthesia review: no  Patient denies shortness of breath, fever, cough and chest pain at PAT appointment   All instructions explained to the patient, with a verbal understanding of the material. Patient agrees to go over the instructions while at home for a better understanding. Patient also instructed to wear a mask when out in public prior to surgery. The opportunity to ask questions was provided.

## 2022-07-16 NOTE — Progress Notes (Signed)
Surgical Instructions    Your procedure is scheduled on June 24,2024.  Report to [facility] Main Entrance "A" at 10:15 A.M., then check in with the Admitting office.  Call this number if you have problems the morning of surgery:  [phone]   If you have any questions prior to your surgery date call [phone]: Open Monday-Friday 8am-4pm If you experience any cold or flu symptoms such as cough, fever, chills, shortness of breath, etc. between now and your scheduled surgery, please notify us at the above number     Remember:  Do not eat or drink anything after midnight the night before your surgery    Take these medicines the morning of surgery with A SIP OF WATER:  sulfamethoxazole-trimethoprim (BACTRIM DS)  oxyCODONE (ROXICODONE) if needed SUMAtriptan (IMITREX) if needed Ubrogepant (UBRELVY) if needed  As of today, STOP taking any Aspirin (unless otherwise instructed by your surgeon) Aleve, Naproxen, Ibuprofen, Motrin, Advil, Goody's, BC's, all herbal medications, fish oil, and all vitamins.           Do not wear jewelry or makeup. Do not wear lotions, powders, perfumes/cologne or deodorant. Do not shave 48 hours prior to surgery.  Men may shave face and neck. Do not bring valuables to the hospital. Do not wear nail polish, gel polish, artificial nails, or any other type of covering on natural nails (fingers and toes) If you have artificial nails or gel coating that need to be removed by a nail salon, please have this removed prior to surgery. Artificial nails or gel coating may interfere with anesthesia's ability to adequately monitor your vital signs.  [facility] is not responsible for any belongings or valuables.    Do NOT Smoke (Tobacco/Vaping)  24 hours prior to your procedure  If you use a CPAP at night, you may bring your mask for your overnight stay.   Contacts, glasses, hearing aids, dentures or partials may not be worn into surgery, please bring cases for these  belongings   For patients admitted to the hospital, discharge time will be determined by your treatment team.   Patients discharged the day of surgery will not be allowed to drive home, and someone needs to stay with them for 24 hours.   SURGICAL WAITING ROOM VISITATION Patients having surgery or a procedure may have no more than 2 support people in the waiting area - these visitors may rotate.   Children under the age of 52 must have an adult with them who is not the patient. If the patient needs to stay at the hospital during part of their recovery, the visitor guidelines for inpatient rooms apply. Pre-op nurse will coordinate an appropriate time for 1 support person to accompany patient in pre-op.  This support person may not rotate.   Please refer to [email or web address] for the visitor guidelines for Inpatients (after your surgery is over and you are in a regular room).    Special instructions:    Oral Hygiene is also important to reduce your risk of infection.  Remember - BRUSH YOUR TEETH THE MORNING OF SURGERY WITH YOUR REGULAR TOOTHPASTE   [place]- Preparing For Surgery  Before surgery, you can play an important role. Because skin is not sterile, your skin needs to be as free of germs as possible. You can reduce the number of germs on your skin by washing with CHG (chlorahexidine gluconate) Soap before surgery.  CHG is an antiseptic cleaner which kills germs and bonds with the skin to continue  killing germs even after washing.     Please do not use if you have an allergy to CHG or antibacterial soaps. If your skin becomes reddened/irritated stop using the CHG.  Do not shave (including legs and underarms) for at least 48 hours prior to first CHG shower. It is OK to shave your face.  Please follow these instructions carefully.     Shower the NIGHT BEFORE SURGERY and the MORNING OF SURGERY with CHG Soap.   If you chose to wash  your hair, wash your hair first as usual with your normal shampoo. After you shampoo, rinse your hair and body thoroughly to remove the shampoo.  Then [employer] and genitals (private parts) with your normal soap and rinse thoroughly to remove soap.  After that Use CHG Soap as you would any other liquid soap. You can apply CHG directly to the skin and wash gently with a scrungie or a clean washcloth.   Apply the CHG Soap to your body ONLY FROM THE NECK DOWN.  Do not use on open wounds or open sores. Avoid contact with your eyes, ears, mouth and genitals (private parts). Wash Face and genitals (private parts)  with your normal soap.   Wash thoroughly, paying special attention to the area where your surgery will be performed.  Thoroughly rinse your body with warm water from the neck down.  DO NOT shower/wash with your normal soap after using and rinsing off the CHG Soap.  Pat yourself dry with a CLEAN TOWEL.  Wear CLEAN PAJAMAS to bed the night before surgery  Place CLEAN SHEETS on your bed the night before your surgery  DO NOT SLEEP WITH PETS.   Day of Surgery:  Take a shower with CHG soap. Wear Clean/Comfortable clothing the morning of surgery Do not apply any deodorants/lotions.   Remember to brush your teeth WITH YOUR REGULAR TOOTHPASTE.    If you received a COVID test during your pre-op visit, it is requested that you wear a mask when out in public, stay away from anyone that may not be feeling well, and notify your surgeon if you develop symptoms. If you have been in contact with anyone that has tested positive in the last 10 days, please notify your surgeon.    Please read over the following fact sheets that you were given.

## 2022-07-25 NOTE — H&P (Signed)
Julie Townsend is an 57 y.o. female.   Chief Complaint: port no longer needed HPI: This is a 57 year old female with left breast cancer and positive genetics who underwent mastectomies last year.  She no longer needs her Port-A-Cath and is ready to have it removed.  Plastic surgery will be doing a latissimus flap on her right side.  The port is a right internal jugular port.  She is otherwise without complaints.  Past Medical History:  Diagnosis Date   BRCA2 gene mutation positive 09/26/2021   Breast cancer (HCC) 09/2021   left breast IDC   Family history of breast cancer 09/22/2021   Family history of ovarian cancer 09/22/2021   Family history of pancreatic cancer 09/22/2021   Genetic testing 09/26/2021   Pathogenic variant in BRCA2 at p.J1914* (c.3922G>T).  Report date is September 26, 2021.    The BRCAplus panel offered by [employer] and includes sequencing and deletion/duplication analysis for the following 8 genes: ATM, BRCA1, BRCA2, CDH1, CHEK2, PALB2, PTEN, and TP53.  Results of pan-cancer panel are pending.     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    correct Hands    Past Surgical History:  Procedure Laterality Date   BREAST IMPLANT REMOVAL Right 12/17/2021   Procedure: Right breast expander removal with washout;  Surgeon: Peggye Form, DO;  Location: [facility];  Service: Plastics;  Laterality: Right;   BREAST RECONSTRUCTION WITH PLACEMENT OF TISSUE EXPANDER AND FLEX HD (ACELLULAR HYDRATED DERMIS) Bilateral 10/29/2021   Procedure: BREAST RECONSTRUCTION WITH PLACEMENT OF TISSUE EXPANDER AND FLEX HD (ACELLULAR HYDRATED DERMIS);  Surgeon: Peggye Form, DO;  Location: [facility];  Service: Plastics;  Laterality: Bilateral;   CARPAL TUNNEL RELEASE Right 06/20/2020   Procedure: CARPAL TUNNEL RELEASE;  Surgeon: Betha Loa, MD;  Location: [facility];  Service: Orthopedics;  Laterality:  Right;   DILATION AND CURETTAGE OF UTERUS     IRRIGATION AND DEBRIDEMENT OF WOUND WITH SPLIT THICKNESS SKIN GRAFT Right 03/15/2022   Procedure: Debridement of right breast;  Surgeon: Peggye Form, DO;  Location: [facility];  Service: Plastics;  Laterality: Right;   MASTECTOMY     MASTECTOMY W/ SENTINEL NODE BIOPSY Left 10/29/2021   Procedure: LEFT MASTECTOMY WITH LEFT SENTINEL LYMPH NODE BIOPSY;  Surgeon: Abigail Miyamoto, MD;  Location: [facility];  Service: General;  Laterality: Left;   PORTACATH PLACEMENT Right 10/29/2021   Procedure: INSERTION PORT-A-CATH;  Surgeon: Abigail Miyamoto, MD;  Location: [facility];  Service: General;  Laterality: Right;   TOTAL MASTECTOMY Right 10/29/2021   Procedure: RIGHT TOTAL MASTECTOMY;  Surgeon: Abigail Miyamoto, MD;  Location: [facility];  Service: General;  Laterality: Right;   TRIGGER FINGER RELEASE Left 05/08/2020   Procedure: LEFT LONG FINGER TRIGGER RELEASE AND LEFT RING FINGER TRIGGER RELEASE;  Surgeon: Betha Loa, MD;  Location: [facility];  Service: Orthopedics;  Laterality: Left;   trigger finger release rt hand      Family History  Problem Relation Age of Onset   Migraines Mother    Breast cancer Mother 34       recurr at  50   Migraines Maternal Aunt    Colon cancer Maternal Uncle        dx late 60s   Pancreatic cancer Maternal Uncle 28   Migraines Maternal Grandmother    Ovarian cancer Maternal Grandmother  dx 1s   Social History:  reports that she has never smoked. She has never been exposed to tobacco smoke. She has never used smokeless tobacco. She reports that she does not drink alcohol and does not use drugs.  Allergies: No Known Allergies  No medications prior to admission.    No results found for this or any previous visit (from the past 48 hour(s)). No results found.  Review of Systems  All other systems reviewed and are negative.   There  were no vitals taken for this visit. Physical Exam Constitutional:      Appearance: Normal appearance.  Cardiovascular:     Rate and Rhythm: Normal rate and regular rhythm.  Pulmonary:     Effort: Pulmonary effort is normal. No respiratory distress.  Abdominal:     General: Abdomen is flat.     Palpations: Abdomen is soft.  Skin:    General: Skin is warm and dry.  Neurological:     General: No focal deficit present.     Mental Status: She is alert.  Psychiatric:        Behavior: Behavior normal.      Assessment/Plan Port-A-Cath no longer needed, left breast cancer  We will proceed with Port-A-Cath removal.  I explained the surgical procedure with her in detail.  We discussed the risk.  She understands and agrees to proceed with surgery  Abigail Miyamoto, MD 07/25/2022, 9:37 AM

## 2022-07-26 ENCOUNTER — Other Ambulatory Visit: Payer: Self-pay

## 2022-07-26 ENCOUNTER — Encounter (HOSPITAL_COMMUNITY): Payer: Self-pay | Admitting: Plastic Surgery

## 2022-07-26 ENCOUNTER — Observation Stay (HOSPITAL_COMMUNITY)
Admission: RE | Admit: 2022-07-26 | Discharge: 2022-07-27 | Disposition: A | Discharge status: Home / Self Care | Payer: Commercial Managed Care - HMO | Attending: Plastic Surgery | Admitting: Plastic Surgery

## 2022-07-26 ENCOUNTER — Encounter (HOSPITAL_COMMUNITY)
Admission: RE | Disposition: A | Discharge status: Home / Self Care | Payer: Self-pay | Source: Home / Self Care | Attending: Plastic Surgery

## 2022-07-26 ENCOUNTER — Ambulatory Visit (HOSPITAL_COMMUNITY): Payer: Commercial Managed Care - HMO | Admitting: Anesthesiology

## 2022-07-26 ENCOUNTER — Ambulatory Visit (HOSPITAL_BASED_OUTPATIENT_CLINIC_OR_DEPARTMENT_OTHER): Payer: Commercial Managed Care - HMO | Admitting: Anesthesiology

## 2022-07-26 DIAGNOSIS — Z171 Estrogen receptor negative status [ER-]: Secondary | ICD-10-CM | POA: Diagnosis not present

## 2022-07-26 DIAGNOSIS — C50512 Malignant neoplasm of lower-outer quadrant of left female breast: Secondary | ICD-10-CM | POA: Diagnosis not present

## 2022-07-26 DIAGNOSIS — C50511 Malignant neoplasm of lower-outer quadrant of right female breast: Secondary | ICD-10-CM | POA: Diagnosis present

## 2022-07-26 DIAGNOSIS — Z421 Encounter for breast reconstruction following mastectomy: Secondary | ICD-10-CM

## 2022-07-26 DIAGNOSIS — Z9011 Acquired absence of right breast and nipple: Secondary | ICD-10-CM

## 2022-07-26 DIAGNOSIS — Z853 Personal history of malignant neoplasm of breast: Secondary | ICD-10-CM | POA: Diagnosis not present

## 2022-07-26 DIAGNOSIS — Z95828 Presence of other vascular implants and grafts: Secondary | ICD-10-CM | POA: Insufficient documentation

## 2022-07-26 HISTORY — PX: LATISSIMUS FLAP TO BREAST: SHX5357

## 2022-07-26 HISTORY — PX: PORT-A-CATH REMOVAL: SHX5289

## 2022-07-26 HISTORY — PX: BREAST RECONSTRUCTION WITH PLACEMENT OF TISSUE EXPANDER AND FLEX HD (ACELLULAR HYDRATED DERMIS): SHX6295

## 2022-07-26 LAB — HIV ANTIBODY (ROUTINE TESTING W REFLEX): HIV Screen 4th Generation wRfx: NONREACTIVE

## 2022-07-26 SURGERY — RECONSTRUCTION, BREAST, USING LATISSIMUS DORSI MYOCUTANEOUS FLAP
Anesthesia: General | Site: Chest | Laterality: Right

## 2022-07-26 MED ORDER — IBUPROFEN 200 MG PO TABS
200.0000 mg | ORAL_TABLET | Freq: Four times a day (QID) | ORAL | Status: DC
  Administered 2022-07-26 (×2): 200 mg via ORAL
  Filled 2022-07-26 (×9): qty 1

## 2022-07-26 MED ORDER — HYDROMORPHONE HCL 1 MG/ML IJ SOLN
INTRAMUSCULAR | Status: DC | PRN
  Administered 2022-07-26: .5 mg via INTRAVENOUS

## 2022-07-26 MED ORDER — SODIUM CHLORIDE 0.9 % IR SOLN
Status: DC | PRN
  Administered 2022-07-26: 1000 mL

## 2022-07-26 MED ORDER — BUPIVACAINE-EPINEPHRINE (PF) 0.25% -1:200000 IJ SOLN
INTRAMUSCULAR | Status: AC
  Filled 2022-07-26: qty 30

## 2022-07-26 MED ORDER — VASHE WOUND IRRIGATION OPTIME
TOPICAL | Status: DC | PRN
  Administered 2022-07-26: 34 [oz_av]

## 2022-07-26 MED ORDER — HEMOSTATIC AGENTS (NO CHARGE) OPTIME
TOPICAL | Status: DC | PRN
  Administered 2022-07-26: 1 via TOPICAL

## 2022-07-26 MED ORDER — HYDROMORPHONE HCL 1 MG/ML IJ SOLN
INTRAMUSCULAR | Status: AC
  Filled 2022-07-26: qty 0.5

## 2022-07-26 MED ORDER — SODIUM CHLORIDE 0.9 % IV SOLN
1.5000 g | Freq: Three times a day (TID) | INTRAVENOUS | Status: DC
  Administered 2022-07-26 – 2022-07-27 (×3): 1.5 g via INTRAVENOUS
  Filled 2022-07-26 (×6): qty 4

## 2022-07-26 MED ORDER — 0.9 % SODIUM CHLORIDE (POUR BTL) OPTIME
TOPICAL | Status: DC | PRN
  Administered 2022-07-26: 1000 mL

## 2022-07-26 MED ORDER — FENTANYL CITRATE (PF) 250 MCG/5ML IJ SOLN
INTRAMUSCULAR | Status: DC | PRN
  Administered 2022-07-26 (×3): 25 ug via INTRAVENOUS
  Administered 2022-07-26 (×2): 50 ug via INTRAVENOUS
  Administered 2022-07-26 (×3): 25 ug via INTRAVENOUS

## 2022-07-26 MED ORDER — OXYCODONE HCL 5 MG PO TABS
5.0000 mg | ORAL_TABLET | Freq: Once | ORAL | Status: AC | PRN
  Administered 2022-07-26: 5 mg via ORAL

## 2022-07-26 MED ORDER — OXYCODONE HCL 5 MG PO TABS
5.0000 mg | ORAL_TABLET | ORAL | Status: DC | PRN
  Administered 2022-07-27 (×2): 10 mg via ORAL
  Filled 2022-07-26 (×2): qty 2

## 2022-07-26 MED ORDER — DIPHENHYDRAMINE HCL 50 MG/ML IJ SOLN
INTRAMUSCULAR | Status: AC
  Filled 2022-07-26: qty 1

## 2022-07-26 MED ORDER — DIPHENHYDRAMINE HCL 50 MG/ML IJ SOLN: 12.5000 mg | Freq: Four times a day (QID) | INTRAMUSCULAR | Status: DC | PRN

## 2022-07-26 MED ORDER — BUPIVACAINE-EPINEPHRINE 0.25% -1:200000 IJ SOLN
INTRAMUSCULAR | Status: DC | PRN
  Administered 2022-07-26: 30 mL

## 2022-07-26 MED ORDER — DIPHENHYDRAMINE HCL 12.5 MG/5ML PO ELIX: 12.5000 mg | ORAL_SOLUTION | Freq: Four times a day (QID) | ORAL | Status: DC | PRN

## 2022-07-26 MED ORDER — LACTATED RINGERS IV SOLN: INTRAVENOUS | Status: DC

## 2022-07-26 MED ORDER — ONDANSETRON 4 MG PO TBDP: 4.0000 mg | ORAL_TABLET | Freq: Four times a day (QID) | ORAL | Status: DC | PRN

## 2022-07-26 MED ORDER — ACETAMINOPHEN 10 MG/ML IV SOLN
INTRAVENOUS | Status: AC
  Filled 2022-07-26: qty 100

## 2022-07-26 MED ORDER — CHLORHEXIDINE GLUCONATE CLOTH 2 % EX PADS: 6.0000 | MEDICATED_PAD | Freq: Once | CUTANEOUS | Status: DC

## 2022-07-26 MED ORDER — MIDAZOLAM HCL 2 MG/2ML IJ SOLN
INTRAMUSCULAR | Status: DC | PRN
  Administered 2022-07-26: 2 mg via INTRAVENOUS

## 2022-07-26 MED ORDER — CHLORHEXIDINE GLUCONATE 0.12 % MT SOLN
OROMUCOSAL | Status: AC
  Administered 2022-07-26: 15 mL via OROMUCOSAL
  Filled 2022-07-26: qty 15

## 2022-07-26 MED ORDER — DIPHENHYDRAMINE HCL 50 MG/ML IJ SOLN
12.5000 mg | Freq: Once | INTRAMUSCULAR | Status: AC
  Administered 2022-07-26: 12.5 mg via INTRAVENOUS

## 2022-07-26 MED ORDER — KETAMINE HCL 50 MG/5ML IJ SOSY
PREFILLED_SYRINGE | INTRAMUSCULAR | Status: AC
  Filled 2022-07-26: qty 5

## 2022-07-26 MED ORDER — PHENYLEPHRINE HCL-NACL 20-0.9 MG/250ML-% IV SOLN: INTRAVENOUS | Status: DC | PRN

## 2022-07-26 MED ORDER — CHLORHEXIDINE GLUCONATE 0.12 % MT SOLN: 15.0000 mL | Freq: Once | OROMUCOSAL | Status: AC

## 2022-07-26 MED ORDER — KETAMINE HCL 10 MG/ML IJ SOLN
INTRAMUSCULAR | Status: DC | PRN
  Administered 2022-07-26 (×2): 10 mg via INTRAVENOUS
  Administered 2022-07-26: 20 mg via INTRAVENOUS

## 2022-07-26 MED ORDER — SODIUM CHLORIDE (PF) 0.9 % IJ SOLN
INTRAMUSCULAR | Status: DC | PRN
  Administered 2022-07-26: 40 mL

## 2022-07-26 MED ORDER — ONDANSETRON HCL 4 MG/2ML IJ SOLN: 4.0000 mg | Freq: Once | INTRAMUSCULAR | Status: DC | PRN

## 2022-07-26 MED ORDER — DEXAMETHASONE SODIUM PHOSPHATE 10 MG/ML IJ SOLN
INTRAMUSCULAR | Status: DC | PRN
  Administered 2022-07-26: 10 mg via INTRAVENOUS

## 2022-07-26 MED ORDER — "FIBRIN SEALANT 5 ML SINGLE DOSE KIT "
PACK | CUTANEOUS | Status: DC | PRN
  Administered 2022-07-26: 5 mL via TOPICAL

## 2022-07-26 MED ORDER — MEPERIDINE HCL 25 MG/ML IJ SOLN: 6.2500 mg | INTRAMUSCULAR | Status: DC | PRN

## 2022-07-26 MED ORDER — HYDROMORPHONE HCL 1 MG/ML IJ SOLN: 1.0000 mg | INTRAMUSCULAR | Status: DC | PRN

## 2022-07-26 MED ORDER — OXYCODONE HCL 5 MG PO TABS
ORAL_TABLET | ORAL | Status: AC
  Filled 2022-07-26: qty 1

## 2022-07-26 MED ORDER — FENTANYL CITRATE (PF) 250 MCG/5ML IJ SOLN
INTRAMUSCULAR | Status: AC
  Filled 2022-07-26: qty 5

## 2022-07-26 MED ORDER — ACETAMINOPHEN 325 MG PO TABS: 325.0000 mg | ORAL_TABLET | ORAL | Status: DC | PRN

## 2022-07-26 MED ORDER — OXYCODONE HCL 5 MG/5ML PO SOLN: 5.0000 mg | Freq: Once | ORAL | Status: AC | PRN

## 2022-07-26 MED ORDER — ACETAMINOPHEN 160 MG/5ML PO SOLN: 325.0000 mg | ORAL | Status: DC | PRN

## 2022-07-26 MED ORDER — ONDANSETRON HCL 4 MG/2ML IJ SOLN
INTRAMUSCULAR | Status: DC | PRN
  Administered 2022-07-26: 4 mg via INTRAVENOUS

## 2022-07-26 MED ORDER — METHOCARBAMOL 1000 MG/10ML IJ SOLN: 500.0000 mg | Freq: Three times a day (TID) | INTRAVENOUS | Status: DC | PRN

## 2022-07-26 MED ORDER — ORAL CARE MOUTH RINSE: 15.0000 mL | Freq: Once | OROMUCOSAL | Status: AC

## 2022-07-26 MED ORDER — ACETAMINOPHEN 10 MG/ML IV SOLN
INTRAVENOUS | Status: DC | PRN
  Administered 2022-07-26: 1000 mg via INTRAVENOUS

## 2022-07-26 MED ORDER — LIDOCAINE 2% (20 MG/ML) 5 ML SYRINGE
INTRAMUSCULAR | Status: DC | PRN
  Administered 2022-07-26: 60 mg via INTRAVENOUS

## 2022-07-26 MED ORDER — MIDAZOLAM HCL 2 MG/2ML IJ SOLN
INTRAMUSCULAR | Status: AC
  Filled 2022-07-26: qty 2

## 2022-07-26 MED ORDER — CEFAZOLIN SODIUM-DEXTROSE 2-4 GM/100ML-% IV SOLN
2.0000 g | INTRAVENOUS | Status: AC
  Administered 2022-07-26: 2 g via INTRAVENOUS

## 2022-07-26 MED ORDER — BUPIVACAINE LIPOSOME 1.3 % IJ SUSP
INTRAMUSCULAR | Status: AC
  Filled 2022-07-26: qty 20

## 2022-07-26 MED ORDER — PROPOFOL 10 MG/ML IV BOLUS
INTRAVENOUS | Status: DC | PRN
  Administered 2022-07-26: 170 mg via INTRAVENOUS

## 2022-07-26 MED ORDER — FENTANYL CITRATE (PF) 100 MCG/2ML IJ SOLN
INTRAMUSCULAR | Status: AC
  Filled 2022-07-26: qty 2

## 2022-07-26 MED ORDER — ZOLPIDEM TARTRATE 5 MG PO TABS: 5.0000 mg | ORAL_TABLET | Freq: Every evening | ORAL | Status: DC | PRN

## 2022-07-26 MED ORDER — METHOCARBAMOL 500 MG PO TABS
500.0000 mg | ORAL_TABLET | Freq: Three times a day (TID) | ORAL | Status: DC | PRN
  Administered 2022-07-26 – 2022-07-27 (×2): 500 mg via ORAL
  Filled 2022-07-26 (×3): qty 1

## 2022-07-26 MED ORDER — DIAZEPAM 2 MG PO TABS: 2.0000 mg | ORAL_TABLET | Freq: Two times a day (BID) | ORAL | Status: DC | PRN

## 2022-07-26 MED ORDER — LIDOCAINE-EPINEPHRINE 1 %-1:100000 IJ SOLN
INTRAMUSCULAR | Status: AC
  Filled 2022-07-26: qty 1

## 2022-07-26 MED ORDER — EPHEDRINE SULFATE-NACL 50-0.9 MG/10ML-% IV SOSY: PREFILLED_SYRINGE | INTRAVENOUS | Status: DC | PRN

## 2022-07-26 MED ORDER — SENNA 8.6 MG PO TABS
1.0000 | ORAL_TABLET | Freq: Two times a day (BID) | ORAL | Status: DC
  Administered 2022-07-26 – 2022-07-27 (×2): 8.6 mg via ORAL
  Filled 2022-07-26 (×2): qty 1

## 2022-07-26 MED ORDER — ONDANSETRON HCL 4 MG/2ML IJ SOLN: 4.0000 mg | Freq: Four times a day (QID) | INTRAMUSCULAR | Status: DC | PRN

## 2022-07-26 MED ORDER — POLYETHYLENE GLYCOL 3350 17 G PO PACK: 17.0000 g | PACK | Freq: Every day | ORAL | Status: DC | PRN

## 2022-07-26 MED ORDER — CEFAZOLIN SODIUM-DEXTROSE 2-4 GM/100ML-% IV SOLN
INTRAVENOUS | Status: AC
  Filled 2022-07-26: qty 100

## 2022-07-26 MED ORDER — ACETAMINOPHEN 325 MG PO TABS
325.0000 mg | ORAL_TABLET | Freq: Four times a day (QID) | ORAL | Status: DC
  Administered 2022-07-26 – 2022-07-27 (×3): 325 mg via ORAL
  Filled 2022-07-26 (×3): qty 1

## 2022-07-26 MED ORDER — PHENYLEPHRINE 80 MCG/ML (10ML) SYRINGE FOR IV PUSH (FOR BLOOD PRESSURE SUPPORT)
PREFILLED_SYRINGE | INTRAVENOUS | Status: DC | PRN
  Administered 2022-07-26 (×4): 80 ug via INTRAVENOUS

## 2022-07-26 MED ORDER — FENTANYL CITRATE (PF) 100 MCG/2ML IJ SOLN
25.0000 ug | INTRAMUSCULAR | Status: DC | PRN
  Administered 2022-07-26: 50 ug via INTRAVENOUS

## 2022-07-26 SURGICAL SUPPLY — 94 items
ADH SKN CLS APL DERMABOND .7 (GAUZE/BANDAGES/DRESSINGS) ×2
ADH SKNCLS APL OCTYL .7 VIOL (GAUZE/BANDAGES/DRESSINGS) ×2
APL PRP STRL LF DISP 70% ISPRP (MISCELLANEOUS) ×2
APL PRP STRL LF ISPRP CHG 10.5 (MISCELLANEOUS)
APPLICATOR CHLORAPREP 10.5 ORG (MISCELLANEOUS) ×2 IMPLANT
APPLIER CLIP 9.375 MED OPEN (MISCELLANEOUS) ×2
APR CLP MED 9.3 20 MLT OPN (MISCELLANEOUS) ×2
BAG COUNTER SPONGE SURGICOUNT (BAG) ×4 IMPLANT
BAG DECANTER FOR FLEXI CONT (MISCELLANEOUS) ×2 IMPLANT
BAG SPNG CNTER NS LX DISP (BAG) ×2
BINDER BREAST LRG (GAUZE/BANDAGES/DRESSINGS) IMPLANT
BINDER BREAST XLRG (GAUZE/BANDAGES/DRESSINGS) IMPLANT
BIOPATCH RED 1 DISK 7.0 (GAUZE/BANDAGES/DRESSINGS) ×4 IMPLANT
BLADE SURG 10 STRL SS (BLADE) ×2 IMPLANT
BLADE SURG 15 STRL LF DISP TIS (BLADE) ×2 IMPLANT
BLADE SURG 15 STRL SS (BLADE)
CANISTER SUCT 3000ML PPV (MISCELLANEOUS) ×2 IMPLANT
CHLORAPREP W/TINT 26 (MISCELLANEOUS) ×2 IMPLANT
CLIP APPLIE 9.375 MED OPEN (MISCELLANEOUS) ×2 IMPLANT
CONNECTOR 5 IN 1 STRAIGHT STRL (MISCELLANEOUS) ×2 IMPLANT
COVER SURGICAL LIGHT HANDLE (MISCELLANEOUS) ×4 IMPLANT
DERMABOND ADVANCED .7 DNX12 (GAUZE/BANDAGES/DRESSINGS) ×6 IMPLANT
DRAIN CHANNEL 19F RND (DRAIN) ×4 IMPLANT
DRAPE HALF SHEET 40X57 (DRAPES) ×4 IMPLANT
DRAPE INCISE 23X17 STRL (DRAPES) ×2 IMPLANT
DRAPE INCISE IOBAN 23X17 STRL (DRAPES) ×2 IMPLANT
DRAPE INCISE IOBAN 85X60 (DRAPES) IMPLANT
DRAPE LAPAROTOMY 100X72 PEDS (DRAPES) ×2 IMPLANT
DRAPE ORTHO SPLIT 77X108 STRL (DRAPES) ×8
DRAPE SURG 17X23 STRL (DRAPES) ×8 IMPLANT
DRAPE SURG ORHT 6 SPLT 77X108 (DRAPES) ×4 IMPLANT
DRAPE WARM FLUID 44X44 (DRAPES) ×2 IMPLANT
DRSG MEPILEX POST OP 4X8 (GAUZE/BANDAGES/DRESSINGS) ×4 IMPLANT
DRSG TEGADERM 4X4.75 (GAUZE/BANDAGES/DRESSINGS) ×2 IMPLANT
ELECT BLADE 4.0 EZ CLEAN MEGAD (MISCELLANEOUS) ×2
ELECT BLADE 6.5 EXT (BLADE) IMPLANT
ELECT CAUTERY BLADE 6.4 (BLADE) ×2 IMPLANT
ELECT REM PT RETURN 9FT ADLT (ELECTROSURGICAL) ×2
ELECTRODE BLDE 4.0 EZ CLN MEGD (MISCELLANEOUS) ×2 IMPLANT
ELECTRODE REM PT RTRN 9FT ADLT (ELECTROSURGICAL) ×4 IMPLANT
EVACUATOR SILICONE 100CC (DRAIN) ×4 IMPLANT
GAUZE 4X4 16PLY ~~LOC~~+RFID DBL (SPONGE) ×2 IMPLANT
GAUZE PAD ABD 8X10 STRL (GAUZE/BANDAGES/DRESSINGS) ×4 IMPLANT
GAUZE SPONGE 2X2 8PLY STRL LF (GAUZE/BANDAGES/DRESSINGS) ×2 IMPLANT
GAUZE SPONGE 4X4 12PLY STRL (GAUZE/BANDAGES/DRESSINGS) ×4 IMPLANT
GLOVE BIO SURGEON STRL SZ 6.5 (GLOVE) ×4 IMPLANT
GLOVE SURG SIGNA 7.5 PF LTX (GLOVE) ×2 IMPLANT
GOWN STRL REUS W/ TWL LRG LVL3 (GOWN DISPOSABLE) ×6 IMPLANT
GOWN STRL REUS W/ TWL XL LVL3 (GOWN DISPOSABLE) ×2 IMPLANT
GOWN STRL REUS W/TWL LRG LVL3 (GOWN DISPOSABLE) ×6
GOWN STRL REUS W/TWL XL LVL3 (GOWN DISPOSABLE) ×2
IMPL BREAST 300CC (Breast) IMPLANT
IMPLANT BREAST 300CC (Breast) ×2 IMPLANT
KIT BASIN OR (CUSTOM PROCEDURE TRAY) ×4 IMPLANT
KIT FILL ASEPTIC TRANSFER (MISCELLANEOUS) IMPLANT
KIT TURNOVER KIT B (KITS) ×4 IMPLANT
NDL 21 GA WING INFUSION (NEEDLE) IMPLANT
NDL HYPO 25GX1X1/2 BEV (NEEDLE) ×4 IMPLANT
NEEDLE 21 GA WING INFUSION (NEEDLE) ×2 IMPLANT
NEEDLE HYPO 25GX1X1/2 BEV (NEEDLE) ×2 IMPLANT
NS IRRIG 1000ML POUR BTL (IV SOLUTION) ×6 IMPLANT
PACK GENERAL/GYN (CUSTOM PROCEDURE TRAY) ×4 IMPLANT
PAD ABD 8X10 STRL (GAUZE/BANDAGES/DRESSINGS) IMPLANT
PAD ARMBOARD 7.5X6 YLW CONV (MISCELLANEOUS) ×2 IMPLANT
PENCIL BUTTON HOLSTER BLD 10FT (ELECTRODE) ×2 IMPLANT
PENCIL SMOKE EVACUATOR (MISCELLANEOUS) ×2 IMPLANT
PIN SAFETY STERILE (MISCELLANEOUS) ×4 IMPLANT
SET ASEPTIC TRANSFER (MISCELLANEOUS) IMPLANT
SPIKE FLUID TRANSFER (MISCELLANEOUS) ×4 IMPLANT
SPONGE T-LAP 18X18 ~~LOC~~+RFID (SPONGE) IMPLANT
STAPLER VISISTAT 35W (STAPLE) ×2 IMPLANT
STRIP CLOSURE SKIN 1/2X4 (GAUZE/BANDAGES/DRESSINGS) IMPLANT
SUT MNCRL AB 3-0 PS2 18 (SUTURE) ×4 IMPLANT
SUT MNCRL AB 4-0 PS2 18 (SUTURE) ×6 IMPLANT
SUT MON AB 3-0 SH 27 (SUTURE)
SUT MON AB 3-0 SH27 (SUTURE) ×4 IMPLANT
SUT MON AB 4-0 PS1 27 (SUTURE) ×4 IMPLANT
SUT MON AB 5-0 PS2 18 (SUTURE) ×4 IMPLANT
SUT PDS AB 2-0 CT1 27 (SUTURE) ×8 IMPLANT
SUT PDS AB 3-0 SH 27 (SUTURE) IMPLANT
SUT SILK 3 0 (SUTURE) ×6
SUT SILK 3-0 FS1 18XBRD (SUTURE) ×4 IMPLANT
SUT SILK 4 0 PS 2 (SUTURE) ×2 IMPLANT
SUT VIC AB 3-0 FS2 27 (SUTURE) IMPLANT
SUT VIC AB 3-0 SH 18 (SUTURE) IMPLANT
SUT VIC AB 3-0 SH 27 (SUTURE) ×2
SUT VIC AB 3-0 SH 27X BRD (SUTURE) ×2 IMPLANT
SYR BULB IRRIG 60ML STRL (SYRINGE) ×2 IMPLANT
SYR CONTROL 10ML LL (SYRINGE) ×4 IMPLANT
TOWEL GREEN STERILE (TOWEL DISPOSABLE) ×4 IMPLANT
TOWEL GREEN STERILE FF (TOWEL DISPOSABLE) ×4 IMPLANT
TRAY FOLEY MTR SLVR 14FR STAT (SET/KITS/TRAYS/PACK) IMPLANT
TUBE CONNECTING 12X1/4 (SUCTIONS) ×2 IMPLANT
YANKAUER SUCT BULB TIP NO VENT (SUCTIONS) ×2 IMPLANT

## 2022-07-26 NOTE — Transfer of Care (Signed)
Immediate Anesthesia Transfer of Care Note  Patient: Julie Townsend  Procedure(s) Performed: Right breast reconstruction and latissimus muscle flap (Right: Breast) PLACEMENT OF TISSUE EXPANDER (Right: Breast) PORT-A-CATH REMOVAL (Chest)  Patient Location: PACU  Anesthesia Type:General  Level of Consciousness: awake, alert , and patient cooperative  Airway & Oxygen Therapy: Patient Spontanous Breathing  Post-op Assessment: Report given to RN and Post -op Vital signs reviewed and stable  Post vital signs: Reviewed and stable  Last Vitals:  Vitals Value Taken Time  BP 143/75 07/26/22 1530  Temp    Pulse 96 07/26/22 1534  Resp 16 07/26/22 1534  SpO2 99 % 07/26/22 1534  Vitals shown include unvalidated device data.  Last Pain:  Vitals:   07/26/22 1045  PainSc: 4          Complications: No notable events documented.

## 2022-07-26 NOTE — Progress Notes (Signed)
Patient refuses sacral dressing. Purpose of Sacral dressing was given to patient, however patient still refused.

## 2022-07-26 NOTE — Anesthesia Preprocedure Evaluation (Signed)
Anesthesia Evaluation  Patient identified by MRN, date of birth, ID band Patient awake    Reviewed: Allergy & Precautions, H&P , NPO status , Patient's Chart, lab work & pertinent test results  Airway Mallampati: II  TM Distance: >3 FB Neck ROM: Full    Dental no notable dental hx.    Pulmonary neg pulmonary ROS   Pulmonary exam normal breath sounds clear to auscultation       Cardiovascular negative cardio ROS Normal cardiovascular exam Rhythm:Regular Rate:Normal  Echo 8/23  1. Left ventricular ejection fraction, by estimation, is 60 to 65%. The left ventricle has normal function. The left ventricle has no regional wall motion abnormalities. Left ventricular diastolic parameters were normal. The average left ventricular global longitudinal strain is -20.5 %. The global longitudinal strain is normal.  2. Right ventricular systolic function is normal. The right ventricular size is normal. There is normal pulmonary artery systolic pressure.  3. The mitral valve is normal in structure. No evidence of mitral valve regurgitation. No evidence of mitral stenosis.  4. The aortic valve is normal in structure. Aortic valve regurgitation is not visualized. No aortic stenosis is present.  5. The inferior vena cava is normal in size with greater than 50% respiratory variability, suggesting right atrial pressure of 3 mmHg.    Neuro/Psych negative neurological ROS  negative psych ROS   GI/Hepatic negative GI ROS, Neg liver ROS,,,  Endo/Other  negative endocrine ROS    Renal/GU negative Renal ROS  negative genitourinary   Musculoskeletal  (+) Arthritis , Osteoarthritis,    Abdominal   Peds negative pediatric ROS (+)  Hematology negative hematology ROS (+)   Anesthesia Other Findings   Reproductive/Obstetrics negative OB ROS                             Anesthesia Physical Anesthesia Plan  ASA:  2  Anesthesia Plan: General   Post-op Pain Management: Minimal or no pain anticipated, Tylenol PO (pre-op)* and Celebrex PO (pre-op)*   Induction: Intravenous  PONV Risk Score and Plan: 3 and Ondansetron, Dexamethasone and Treatment may vary due to age or medical condition  Airway Management Planned: LMA and Oral ETT  Additional Equipment: None  Intra-op Plan:   Post-operative Plan: Extubation in OR  Informed Consent: I have reviewed the patients History and Physical, chart, labs and discussed the procedure including the risks, benefits and alternatives for the proposed anesthesia with the patient or authorized representative who has indicated his/her understanding and acceptance.     Dental advisory given  Plan Discussed with: CRNA and Anesthesiologist  Anesthesia Plan Comments: (  )       Anesthesia Quick Evaluation

## 2022-07-26 NOTE — Op Note (Signed)
DATE OF OPERATION: 07/26/2022  LOCATION: [facility]   PREOPERATIVE DIAGNOSIS:  Breast Cancer Status post right mastectomy. 2.   Acquired absence of right Breast.  POSTOPERATIVE DIAGNOSIS: Same  PROCEDURE: 1. Latissimus myocutaneous flap  to reconstruct the right breast CPT 19361 2. Tissue expander placement CPT 19357  SURGEON: Rahiem Schellinger Sanger Suhayb Anzalone, DO  ASSISTANT: Burna Forts, PA  EBL: 50 cc  SPECIMEN: None  DRAINS: 3 total 19 blake round drains  EXPANDER: [place] Artoura Plus Smooth breast tissue expander 300 cc with 20 cc of saline placed.  CONDITION: Stable  COMPLICATIONS: None  INDICATION: The patient, Julie Townsend, is a 57 y.o. female born on 1965/12/27, is here for treatment after bilateral mastectomies with resulting absence of breast and asymmetry. She had expanders placed.  She had a seroma of the right breast and the expander was removed.  She had an infection as well.  That is cleared up now.  She decided on reconstruction with a latissimus muscle flap.  PROCEDURE DETAILS:  The patient was seen on the morning of her surgery and marked for the location of the flap.  An IV was placed and IV antibiotics were given. The patient was taken to the operating room and placed on the operating room table.  A general anesthetic was administered.  A standard time out was performed and all information was confirmed to be correct by those in the room. Leg compression devices were placed on her legs.  The patient was placed in the left lateral decubitus position on a beanbag.  All key prominent points were padded and an axillary roll was placed in the dependent axilla. The ipsilateral arm was placed on a padded rest anteriosuperiorly.  She was then prepped and draped in the standard sterile fashion. The paddle design and position were confirmed.   The procedure began by incising the skin at the marked skin paddle.  The #10 blade and bovie were used to dissect down to the latissimus muscle.   Anterior and posterior flaps were raised to expose the latissimus muscle edges.  The skin and fat flaps were elevated to the extent necessary for release of the muscle.  The muscle was released at the superior edge of the inferior angle of the scapula.  This aids in identification of the serratus muscle to prevent lifting it with the latissimus muscle.  The larger caliber perforator were clipped and the smaller vessels controlled with electrocautery.  The dissection continued toward the midline and inferior toward the iliac crest.  The subscapular artery to the thoracodorsal artery was palpated in the axilla.  The branch to the serratus is ligated.  Care was taken to protect the vascular pedicle throughout this portion of the procedure. The paddle and muscle looked healthy throughout the case.  The posterior and anterior pockets were then connected in the plane above the muscle.Evicel was placed. Experel was placed. Two #19 blake round drains were place in the anterior skin flap and secured with 3-0 Silk. One drain was positioned inferiorly and one superiorly.  The back incision was closed in layers with buried 3-0 PDS, followed by 3-0 Monocryl and 4-0 Monocryl.  Dermabond and a protective dressing was applied.   The patient was repositioned onto her back and the chest was prepped and draped. The mastectomy scar was incised and the flaps on top of the pectoralis muscle were raised.  The muscle from the back and the skin paddle were then rotated into the chest pocket. The flap and skin pedicle  were inspected and there was no tension.  Arista was placed in the lateral pocket. The breast pocket was inspected and hemostases was achieved with electrocautery. The muscle was then secured superiorly to the pectoralis muscle with 3-0 Vicryl. A 300 cc expander was chosen. It was soaked in Vashe and evacuated of air and then filled with 50 cc of sterile saline. I then removed 30 cc.  Total of 20 cc in the expander. Experel  was placed. The inferior portion of the muscle was tacked to the inframammary fold with 3-0 Vicryl  One drain was placed on this side and secured with 3-0 Silk. The flap was then closed with 3-0 PDS deep, followed by 3-0 Monocryl and the skin closed with 4-0 Monocryl.  Dermabond, ABDs and a breast binder was applied.    The patient was allowed to wake up and taken to recovery room in stable condition at the end of the case. The family was notified at the end of the case. This was after the general surgeon removed the port-a-cath.  The advanced practice practitioner (APP) assisted throughout the case.  The APP was essential in retraction and counter traction when needed to make the case progress smoothly.  This retraction and assistance made it possible to see the tissue plans for the procedure.  The assistance was needed for blood control, tissue re-approximation and assisted with closure of the incision site.

## 2022-07-26 NOTE — Discharge Instructions (Signed)

## 2022-07-26 NOTE — Anesthesia Procedure Notes (Signed)
Procedure Name: LMA Insertion Date/Time: 07/26/2022 12:08 PM  Performed by: Stanton Kidney, CRNAPre-anesthesia Checklist: Patient identified, Patient being monitored, Timeout performed, Emergency Drugs available and Suction available Patient Re-evaluated:Patient Re-evaluated prior to induction Oxygen Delivery Method: Circle system utilized Preoxygenation: Pre-oxygenation with 100% oxygen Induction Type: IV induction Ventilation: Mask ventilation without difficulty LMA: LMA inserted LMA Size: 4.0 Tube type: Oral Number of attempts: 1 Placement Confirmation: positive ETCO2 and breath sounds checked- equal and bilateral Tube secured with: Tape Dental Injury: Teeth and Oropharynx as per pre-operative assessment

## 2022-07-26 NOTE — Op Note (Signed)
   Kaylana Fenstermacher Perez-Valentine 07/26/2022   Pre-op Diagnosis: port-a-cath no longer needed     Post-op Diagnosis: same  Procedure(s): PORT-A-CATH REMOVAL  Surgeon: Abigail Miyamoto, MD  Anesthesia: General  Staff:  Circulator: Cheryle Horsfall, RN; Garner Nash, RN Physician Assistant: Eyvonne Mechanic, PA-C Scrub Person: Cecile Sheerer; Arlyce Dice E  Estimated Blood Loss: Minimal               Indications: This is a 57 year old female who has completed her treatment for breast cancer.  Her Port-A-Cath is no longer needed.  She is undergoing a right latissimus flap reconstruction of her right breast status postmastectomy.  Port-A-Cath can be removed at the same time  Procedure: Dr. Ulice Bold I completed the latissimus flap.  The patient was already prepped and draped.  I made an incision on the right upper chest through her previous scar with a scalpel at the port site.  I dissected down to the port which is easily identified and elevated.  The suture holding the port in place was cut free.  The port and the entire catheter easily then came out of the pocket and jugular vein.  I closed the tract from the port catheter site with a figure-of-eight 3-0 Vicryl suture.  The subcutaneous tissue was then closed with interrupted 3-0 Vicryl sutures and the skin was closed with running 4-0 Monocryl.  Dermabond was then applied.  The patient tolerated the procedure well.  All the counts were correct at the end of the procedure.  The patient was then extubated in the operating room and taken in stable condition to the recovery room.          Abigail Miyamoto   Date: 07/26/2022  Time: 3:07 PM

## 2022-07-26 NOTE — Interval H&P Note (Signed)
History and Physical Interval Note:no change in H and P  07/26/2022 11:00 AM  Julie Townsend  has presented today for surgery, with the diagnosis of BRCA2 gene mutation positive Z15.01, Z15.09  Malignant neoplasm of lower-outer quadrant of left breast of female, estrogen receptor negative.  The various methods of treatment have been discussed with the patient and family. After consideration of risks, benefits and other options for treatment, the patient has consented to  Procedure(s): Right breast reconstruction with latissimus muscle flap and expander placement (Right) BREAST RECONSTRUCTION WITH PLACEMENT OF TISSUE EXPANDER (Right) REMOVAL PORT-A-CATH (N/A) as a surgical intervention.  The patient's history has been reviewed, patient examined, no change in status, stable for surgery.  I have reviewed the patient's chart and labs.  Questions were answered to the patient's satisfaction.     Abigail Miyamoto

## 2022-07-26 NOTE — Interval H&P Note (Signed)
History and Physical Interval Note:  07/26/2022 10:21 AM  Julie Townsend  has presented today for surgery, with the diagnosis of BRCA2 gene mutation positive Z15.01, Z15.09  Malignant neoplasm of lower-outer quadrant of left breast of female, estrogen receptor negative.  The various methods of treatment have been discussed with the patient and family. After consideration of risks, benefits and other options for treatment, the patient has consented to  Procedure(s): Right breast reconstruction with latissimus muscle flap and expander placement (Right) BREAST RECONSTRUCTION WITH PLACEMENT OF TISSUE EXPANDER (Right) REMOVAL PORT-A-CATH (N/A) as a surgical intervention.  The patient's history has been reviewed, patient examined, no change in status, stable for surgery.  I have reviewed the patient's chart and labs.  Questions were answered to the patient's satisfaction.     [name] [name]

## 2022-07-26 NOTE — Anesthesia Postprocedure Evaluation (Signed)
Anesthesia Post Note  Patient: Julie Townsend  Procedure(s) Performed: Right breast reconstruction and latissimus muscle flap (Right: Breast) PLACEMENT OF TISSUE EXPANDER (Right: Breast) PORT-A-CATH REMOVAL (Chest)     Patient location during evaluation: PACU Anesthesia Type: General Level of consciousness: sedated Pain management: pain level controlled Vital Signs Assessment: post-procedure vital signs reviewed and stable Respiratory status: spontaneous breathing and respiratory function stable Cardiovascular status: stable Postop Assessment: no apparent nausea or vomiting Anesthetic complications: no   No notable events documented.  Last Vitals:  Vitals:   07/26/22 1615 07/26/22 1630  BP: (!) 161/90 (!) 163/83  Pulse: 95 86  Resp: 15 12  Temp:  36.6 C  SpO2: 98% 94%    Last Pain:  Vitals:   07/26/22 1700  PainSc: 0-No pain                 Markee Matera DANIEL

## 2022-07-27 ENCOUNTER — Encounter (HOSPITAL_COMMUNITY): Payer: Self-pay | Admitting: Plastic Surgery

## 2022-07-27 DIAGNOSIS — C50511 Malignant neoplasm of lower-outer quadrant of right female breast: Secondary | ICD-10-CM | POA: Diagnosis not present

## 2022-07-27 NOTE — Discharge Summary (Signed)
Physician Discharge Summary  Patient ID: Julie Townsend MRN: [id number] DOB/AGE: 57-Nov-1967 57 y.o.  Admit date: 07/26/2022 Discharge date: 07/27/2022  Admission Diagnoses:   Discharge Diagnoses:  Principal Problem:   Acquired absence of right breast   Discharged Condition: good  Hospital Course: This is a 57 year old female who was brought to [facility] for right-sided latissimus flap by Dr. Ulice Bold yesterday on 07/26/2022.  Postoperatively patient did well.  She did note some pain overnight.  Her JP drains put out a total of 210 cc of serosanguineous output over the course of the night.  Consults: None  Significant Diagnostic Studies:   Treatments: Unasyn, oxycodone  Discharge Exam: Blood pressure (!) 112/57, pulse 86, temperature 97.8 F (36.6 C), temperature source Oral, resp. rate 12, height 5\' 2"  (1.575 m), weight 51.7 kg, SpO2 99 %. Right-sided skin paddle with cap refill intact, incisions clean dry and intact, mastectomy flaps are viable.  JP drains with serosanguineous output.  Right thoracic incision with dressing over top clean dry and intact.  Patient resting comfortably in exam bed in no acute distress.  Disposition: Discharge disposition: 01-Home or Self Care      Overall the patient is doing well postoperatively.  I had a lengthy discussion with both the patient and her husband about wound care upon discharge.  She was given 1 more dose of her IV antibiotics and pain medication.  She understands to reach out to [place] with any further questions or concerns she may have. Discharge Instructions     Call MD for:  difficulty breathing, headache or visual disturbances   Complete by: As directed    Call MD for:  extreme fatigue   Complete by: As directed    Call MD for:  hives   Complete by: As directed    Call MD for:  persistant dizziness or light-headedness   Complete by: As directed    Call MD for:  persistant nausea and vomiting   Complete by:  As directed    Call MD for:  redness, tenderness, or signs of infection (pain, swelling, redness, odor or green/yellow discharge around incision site)   Complete by: As directed    Call MD for:  severe uncontrolled pain   Complete by: As directed    Call MD for:  temperature >100.4   Complete by: As directed    Diet general   Complete by: As directed    Increase activity slowly   Complete by: As directed       Allergies as of 07/27/2022   No Known Allergies      Medication List     TAKE these medications    Emgality 120 MG/ML Soaj Generic drug: Galcanezumab-gnlm Inject 120 mg into the skin every 28 (twenty-eight) days.   EPINEPHrine 0.3 mg/0.3 mL Soaj injection Commonly known as: EPI-PEN Inject 0.3 mg into the muscle as needed for anaphylaxis.   nortriptyline 10 MG capsule Commonly known as: PAMELOR Take 1 capsule (10 mg total) by mouth at bedtime.   predniSONE 50 MG tablet Commonly known as: DELTASONE Take 1 tablet (50 mg total) by mouth daily with breakfast.   sulfamethoxazole-trimethoprim 800-160 MG tablet Commonly known as: BACTRIM DS Take 1 tablet by mouth 2 (two) times daily for 14 days.   SUMAtriptan 50 MG tablet Commonly known as: IMITREX TAKE 1 TAB BY MOUTH AS NEEDED FOR MIGRAINE. MAX 2 TABS IN 24 HOURS. MAY REPEAT IN 2 HOURS IF HEADACHE PERSISTS OR RECURS.   Bernita Raisin  100 MG Tabs Generic drug: Ubrogepant TAKE AS NEEDED (TAKE 1 AT THE EARLIST ONSET OF A MIGRAINE. MAY REPEAT IN 2 HOURS. MAX 2/24 HOURS).        Follow-up Information     Dillingham, [name], DO Follow up in 10 day(s).   Specialty: Plastic Surgery Contact information: [address] [place] [address] [phone]                 [facility] [address] [place], [place] [address] [phone]  Signed: Kelle Darting Hermen Mario 07/27/2022, 3:17 PM

## 2022-08-02 NOTE — Telephone Encounter (Signed)
PA has been APPROVED from 07/14/2022-07/14/2023

## 2022-08-03 ENCOUNTER — Ambulatory Visit (INDEPENDENT_AMBULATORY_CARE_PROVIDER_SITE_OTHER): Payer: Commercial Managed Care - HMO | Admitting: Plastic Surgery

## 2022-08-03 ENCOUNTER — Encounter: Payer: Self-pay | Admitting: Plastic Surgery

## 2022-08-03 DIAGNOSIS — Z9011 Acquired absence of right breast and nipple: Secondary | ICD-10-CM

## 2022-08-03 NOTE — Progress Notes (Signed)
The patient is a 57 year old female here for follow-up after undergoing a right latissimus muscle flap for right breast reconstruction.  Her skin was tight at the time of surgery so only 50 cc was placed and then I actually removed some so she only had 20 cc in the 300 cc expander.  The back lower drain was irritating her significantly.  I went ahead and removed that 1.  The other ones will need to stay in for another 1 to 2 weeks.  The flap looks good and has good color.  Will plan on filling the expander next week.  Sign of hematoma or seroma on the front or the back.

## 2022-08-09 NOTE — Progress Notes (Signed)
Patient is a pleasant 57 year old female s/p right-sided breast reconstruction with latissimus muscle flap performed 07/26/2022 Dr. Ulice Bold who presents to clinic for postoperative follow-up.  She was last seen here in clinic on 08/03/2022.  At that time, the lower drain in the back was removed without complication.  The other 2 would need to stay in for an additional 1 to 2 weeks.  The flap appeared healthy with good color.  Only 20 cc saline was injected into the 300 cc expander on time of surgery due to skin tightness.  Discussed possible expansion next week.  Today, patient doing okay.  She is companied by her husband at bedside.  She states that her back, specifically the drain on the back, has been uncomfortable.  However, she has been able to manage her pain symptoms with Tylenol alone.  She has not required any ongoing oxycodone.  She denies any significant discomfort on the right side of the chest or involving the remaining anterior drain.  She states that each have been functional.  Remaining drain in the back has been putting out approximately 90 cc/day whereas the anterior chest wall drain has been putting out less than 10 cc/day.   On exam, each of the drain tubes are intact and functional.  Normal-appearing output.  Anterior chest wall drain removed without complication or difficulty.  No evidence concerning for seroma or hematoma.  The bordered Mepilex dressing is removed from the latissimus dorsi paddle site and there appears to be a 1.5 x 0.25 cm incisional wound on the inferior aspect of the paddle.  Difficult to stage depth given overlying scabbing.  Steri-Strip is removed from the surgical site on the back.  Incision is CDI, no underlying fluid collections appreciated.  Healing well.  No lower extremity swelling or edema.  Will leave the remaining back drain in place despite it being uncomfortable for the patient.  Would like to see output less than 30 cc/day.  Will have her return in 10  days for consideration of removal as well as consideration of expander fill.  Will not provide expander fill today given that she has an incisional wound on the inferior aspect of the latissimus dorsi paddle.  She is already tight and do not want to put any more pressure on that incision.  Recommending Vaseline over her incisions and bordered Mepilex dressing replacement over the anterior chest wall.  Return in 10 days, sooner if she develops any questions or concerns.  No evidence concerning for infection on exam.  Picture(s) obtained of the patient and placed in the chart were with the patient's or guardian's permission.

## 2022-08-10 ENCOUNTER — Ambulatory Visit (INDEPENDENT_AMBULATORY_CARE_PROVIDER_SITE_OTHER): Payer: Commercial Managed Care - HMO | Admitting: Physician Assistant

## 2022-08-10 DIAGNOSIS — Z9889 Other specified postprocedural states: Secondary | ICD-10-CM

## 2022-08-10 DIAGNOSIS — C50512 Malignant neoplasm of lower-outer quadrant of left female breast: Secondary | ICD-10-CM

## 2022-08-16 ENCOUNTER — Inpatient Hospital Stay: Payer: Commercial Managed Care - HMO | Attending: Hematology and Oncology | Admitting: Adult Health

## 2022-08-16 ENCOUNTER — Other Ambulatory Visit: Payer: Self-pay

## 2022-08-16 ENCOUNTER — Encounter: Payer: Self-pay | Admitting: Adult Health

## 2022-08-16 VITALS — BP 169/117 | HR 89 | Temp 97.9°F | Wt 118.7 lb

## 2022-08-16 DIAGNOSIS — Z9221 Personal history of antineoplastic chemotherapy: Secondary | ICD-10-CM | POA: Insufficient documentation

## 2022-08-16 DIAGNOSIS — N898 Other specified noninflammatory disorders of vagina: Secondary | ICD-10-CM | POA: Insufficient documentation

## 2022-08-16 DIAGNOSIS — Z803 Family history of malignant neoplasm of breast: Secondary | ICD-10-CM | POA: Insufficient documentation

## 2022-08-16 DIAGNOSIS — C50512 Malignant neoplasm of lower-outer quadrant of left female breast: Secondary | ICD-10-CM | POA: Insufficient documentation

## 2022-08-16 DIAGNOSIS — Z1502 Genetic susceptibility to malignant neoplasm of ovary: Secondary | ICD-10-CM | POA: Diagnosis not present

## 2022-08-16 DIAGNOSIS — Z800 Family history of malignant neoplasm of digestive organs: Secondary | ICD-10-CM | POA: Diagnosis not present

## 2022-08-16 DIAGNOSIS — Z8041 Family history of malignant neoplasm of ovary: Secondary | ICD-10-CM | POA: Diagnosis not present

## 2022-08-16 DIAGNOSIS — Z9013 Acquired absence of bilateral breasts and nipples: Secondary | ICD-10-CM | POA: Diagnosis not present

## 2022-08-16 DIAGNOSIS — Z1509 Genetic susceptibility to other malignant neoplasm: Secondary | ICD-10-CM | POA: Diagnosis not present

## 2022-08-16 DIAGNOSIS — Z1501 Genetic susceptibility to malignant neoplasm of breast: Secondary | ICD-10-CM | POA: Diagnosis not present

## 2022-08-16 DIAGNOSIS — Z171 Estrogen receptor negative status [ER-]: Secondary | ICD-10-CM | POA: Diagnosis not present

## 2022-08-16 MED ORDER — ESTRADIOL 10 MCG VA TABS: ORAL_TABLET | VAGINAL | 5 refills | Status: DC

## 2022-08-16 NOTE — Progress Notes (Signed)
SURVIVORSHIP VISIT:  BRIEF ONCOLOGIC HISTORY:  Oncology History  Malignant neoplasm of lower-outer quadrant of left breast of female, estrogen receptor negative (HCC)  09/07/2021 Initial Diagnosis   Screening mammogram detected left breast asymmetry posteriorly.  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  Report date is September 26, 2021 (BRCAPlus) and September 29, 2021 (expanded).    The CancerNext-Expanded gene panel offered by [facility] and includes sequencing, rearrangement, and RNA analysis for the following 77 genes: AIP, ALK, APC, ATM, AXIN2, BAP1, BARD1, BLM, BMPR1A, BRCA1, BRCA2, BRIP1, CDC73, CDH1, CDK4, CDKN1B, CDKN2A, CHEK2, CTNNA1, DICER1, FANCC, FH, FLCN, GALNT12, KIF1B, LZTR1, MAX, MEN1, MET, MLH1, MSH2, MSH3, MSH6, MUTYH, NBN, NF1, NF2, NTHL1, PALB2, PHOX2B, PMS2, POT1, PRKAR1A, PTCH1, PTEN, RAD51C, RAD51D, RB1, RECQL, RET, SDHA, SDHAF2, SDHB, SDHC, SDHD, SMAD4, SMARCA4, SMARCB1, SMARCE1, STK11, SUFU, TMEM127, TP53, TSC1, TSC2, VHL and XRCC2 (sequencing and deletion/duplication); EGFR, EGLN1, HOXB13, KIT, MITF, PDGFRA, POLD1, and POLE (sequencing only); EPCAM and GREM1 (deletion/duplication only).    10/29/2021 Surgery   Bilateral mastectomies Right mastectomy: Benign, PASH Left mastectomy: Grade 3 IDC with DCIS 1.2 cm, margins negative, 0/5 sentinel lymph nodes negative, ER 0%, PR 0%, HER2 negative, Ki-67 70%   12/03/2021 - 06/03/2022 Chemotherapy   Patient is on Treatment Plan : BREAST  ADJUVANT DOSE DENSE AC q14d / PACLitaxel q7d       INTERVAL HISTORY:  Julie Townsend to review her survivorship care plan detailing her treatment course for breast cancer, as well as monitoring long-term side effects of that treatment, education regarding health maintenance, screening, and overall wellness and health promotion.     Overall, Julie Townsend reports feeling quite well.  She recently underwent reconstruction surgery and declines an exam today.  She is recovering from her surgery and has some pain that she is managing.  She is undergoing [employer] testing and tells me she is due to hear from them next month.  She is also scheduled for BSO next month.    She wants to know if she can receive a vaginal cream for her vaginal dryness.  Her gyn had discontinued what she previously received once she was diagnosed with breast cancer.     REVIEW OF SYSTEMS:  Review of Systems  Constitutional:  Negative for appetite change, chills, fatigue, fever and unexpected weight change.  HENT:   Negative for hearing loss, lump/mass and trouble swallowing.   Eyes:  Negative for eye problems and icterus.  Respiratory:  Negative for chest tightness, cough and shortness of breath.   Cardiovascular:  Negative for chest pain, leg swelling and palpitations.  Gastrointestinal:  Negative for abdominal distention, abdominal pain, constipation, diarrhea, nausea and vomiting.  Endocrine: Negative for hot flashes.  Genitourinary:  Negative for difficulty urinating.   Musculoskeletal:  Negative for arthralgias.  Skin:  Negative for itching and rash.  Neurological:  Negative for dizziness, extremity weakness, headaches and numbness.  Hematological:  Negative for adenopathy. Does not bruise/bleed easily.  Psychiatric/Behavioral:  Negative for depression. The patient is not nervous/anxious.  Breast: Denies any new nodularity, masses, tenderness, nipple changes, or nipple discharge.       PAST  MEDICAL/SURGICAL HISTORY:  Past Medical History:  Diagnosis Date   BRCA2 gene mutation positive 09/26/2021   Breast cancer (HCC) 09/2021   left breast IDC   Family history of breast cancer 09/22/2021   Family history of ovarian cancer 09/22/2021   Family history of pancreatic cancer 09/22/2021   Genetic testing 09/26/2021   Pathogenic variant in BRCA2 at p.O1308* (c.3922G>T).  Report date is September 26, 2021.    The BRCAplus panel offered by [facility] and includes sequencing and deletion/duplication analysis for the following 8 genes: ATM, BRCA1, BRCA2, CDH1, CHEK2, PALB2, PTEN, and TP53.  Results of pan-cancer panel are pending.     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    correct Hands   Port-A-Cath in place 03/10/2022   Past Surgical History:  Procedure Laterality Date   BREAST IMPLANT REMOVAL Right 12/17/2021   Procedure: Right breast expander removal with washout;  Surgeon: Peggye Form, DO;  Location: [facility];  Service: Plastics;  Laterality: Right;   BREAST RECONSTRUCTION WITH PLACEMENT OF TISSUE EXPANDER AND FLEX HD (ACELLULAR HYDRATED DERMIS) Bilateral 10/29/2021   Procedure: BREAST RECONSTRUCTION WITH PLACEMENT OF TISSUE EXPANDER AND FLEX HD (ACELLULAR HYDRATED DERMIS);  Surgeon: Peggye Form, DO;  Location: [facility];  Service: Plastics;  Laterality: Bilateral;   BREAST RECONSTRUCTION WITH PLACEMENT OF TISSUE EXPANDER AND FLEX HD (ACELLULAR HYDRATED DERMIS) Right 07/26/2022   Procedure: PLACEMENT OF TISSUE EXPANDER;  Surgeon: Peggye Form, DO;  Location: [facility];  Service: Plastics;  Laterality: Right;   CARPAL TUNNEL RELEASE Right 06/20/2020   Procedure: CARPAL TUNNEL RELEASE;  Surgeon: Betha Loa, MD;  Location: [facility];  Service: Orthopedics;  Laterality: Right;   DILATION AND CURETTAGE OF UTERUS     IRRIGATION AND DEBRIDEMENT OF WOUND WITH SPLIT THICKNESS SKIN GRAFT  Right 03/15/2022   Procedure: Debridement of right breast;  Surgeon: Peggye Form, DO;  Location: [facility];  Service: Plastics;  Laterality: Right;   LATISSIMUS FLAP TO BREAST Right 07/26/2022   Procedure: Right breast reconstruction and latissimus muscle flap;  Surgeon: Peggye Form, DO;  Location: [facility];  Service: Plastics;  Laterality: Right;   MASTECTOMY     MASTECTOMY W/ SENTINEL NODE BIOPSY Left 10/29/2021   Procedure: LEFT MASTECTOMY WITH LEFT SENTINEL LYMPH NODE BIOPSY;  Surgeon: Abigail Miyamoto, MD;  Location: [facility];  Service: General;  Laterality: Left;   PORT-A-CATH REMOVAL N/A 07/26/2022   Procedure: PORT-A-CATH REMOVAL;  Surgeon: Abigail Miyamoto, MD;  [facility];  Service: General;  Laterality: N/A;   PORTACATH PLACEMENT Right 10/29/2021   Procedure: INSERTION PORT-A-CATH;  Surgeon: Abigail Miyamoto, MD;  Location: [facility];  Service: General;  Laterality: Right;   TOTAL MASTECTOMY Right 10/29/2021   Procedure: RIGHT TOTAL MASTECTOMY;  Surgeon: Abigail Miyamoto, MD;  Location: [facility];  Service: General;  Laterality: Right;   TRIGGER FINGER RELEASE Left 05/08/2020   Procedure: LEFT LONG FINGER TRIGGER RELEASE AND LEFT RING FINGER TRIGGER RELEASE;  Surgeon: Betha Loa, MD;  Location: [facility];  Service: Orthopedics;  Laterality: Left;   trigger finger release rt hand       ALLERGIES:  Allergies  Allergen Reactions   Bee Venom Anaphylaxis    Yellow jackets only     CURRENT MEDICATIONS:  Outpatient Encounter Medications as of 08/16/2022  Medication Sig   EPINEPHrine 0.3 mg/0.3 mL IJ SOAJ injection Inject 0.3 mg into the muscle as needed for anaphylaxis.   Estradiol 10 MCG TABS vaginal tablet Insert 0.5 applicator vaginally three times a week   Galcanezumab-gnlm (EMGALITY) 120 MG/ML SOAJ Inject 120 mg into the skin every 28 (twenty-eight) days.   nortriptyline (PAMELOR) 10 MG  capsule Take 1 capsule (10 mg total) by mouth at bedtime.   Ubrogepant (UBRELVY) 100 MG TABS TAKE AS NEEDED (TAKE 1 AT THE EARLIST ONSET OF A MIGRAINE. MAY REPEAT IN 2 HOURS. MAX 2/24 HOURS).   ZAVZPRET 10 MG/ACT SOLN SMARTSIG:1 Spray(s) Both Nares Daily PRN   SUMAtriptan (IMITREX) 50 MG tablet TAKE 1 TAB BY MOUTH AS NEEDED FOR MIGRAINE. MAX 2 TABS IN 24 HOURS. MAY REPEAT IN 2 HOURS IF HEADACHE PERSISTS OR RECURS. (Patient not taking: Reported on 08/16/2022)   No facility-administered encounter medications on file as of 08/16/2022.     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   Right handed   Social Determinants of Health   Financial Resource Strain: Not on file  Food Insecurity: Not on file  Transportation Needs: Not on file  Physical Activity: Not on file  Stress: Not on file  Social Connections: Not on file  Intimate Partner Violence: Not on file     OBSERVATIONS/OBJECTIVE:  BP (!) 169/117 (BP Location: Left Leg, Patient Position:  Sitting) Comment: provider notified  Pulse 89   Temp 97.9 F (36.6 C) (Temporal)   Wt 118 lb 11.2 oz (53.8 kg)   SpO2 98%   BMI 21.71 kg/m  Patient appears well, she is in no apparent distress, declines exam today.    LABORATORY DATA:  None for this visit.  DIAGNOSTIC IMAGING:  None for this visit.      ASSESSMENT AND PLAN:  Julie Townsend is a pleasant 57 y.o. female with Stage IB left breast invasive ductal carcinoma, ER-/PR-/HER2-, diagnosed in 09/2021, treated with bilateral mastectomies and adjuvant chemotherapy.  She presents to the Survivorship Clinic for our initial meeting and routine follow-up post-completion of treatment for breast cancer.    1. Stage IB left breast cancer:  Julie Townsend is continuing to recover from definitive treatment for breast cancer. She  will follow-up with her medical oncologist, Dr.  Pamelia Hoit in 6 months with history and physical exam per surveillance protocol.   Today, a comprehensive survivorship care plan and treatment summary was reviewed with the patient today detailing her breast cancer diagnosis, treatment course, potential late/long-term effects of treatment, appropriate follow-up care with recommendations for the future, and patient education resources.  A copy of this summary, along with a letter will be sent to the patient's primary care provider via mail/fax/In Basket message after today's visit.    2. BRCA2 Mutation: S/p bilateral mastectomies, scheduled for BSO next month.  Due to her family history of pancreatic cancer, she is recommended annual MRCP and will meet with GI to review this next month.  I also recommended annual skin exam with dermatology.   3. Bone health:  She was given education on specific activities to promote bone health.  4. Cancer screening:  Due to Julie Townsend's history and her age, she should receive screening for skin cancers, colon cancer, and gynecologic cancers.  The information and  recommendations are listed on the patient's comprehensive care plan/treatment summary and were reviewed in detail with the patient.    5. Health maintenance and wellness promotion: Julie Townsend was encouraged to consume 5-7 servings of fruits and vegetables per day. We reviewed the "Nutrition Rainbow" handout.  She was also encouraged to engage in moderate to vigorous exercise for 30 minutes per day most days of the week.  She was instructed to limit her alcohol consumption and continue to abstain from tobacco useg.     7. Support services/counseling: It is not uncommon for this period of the patient's cancer care trajectory to be one of many emotions and stressors.   She was given information regarding our available services and encouraged to contact me with any questions or for help enrolling in any of our support group/programs.   8. Vaginal Dryness: I sent in vaginal estrace cream to her pharmacy.  Considering she has triple negative breast cancer and no remaining breast tissue, and the fact that vaginal cream used in small amounts is likely not systemically absorbed, myself and Dr. Pamelia Hoit believe it is safe for her to use.    Follow up instructions:    -Return to cancer center in 6 months for f/u with me -f/u with GI to discuss MRCP -BSO 09/2022 -She is welcome to return back to the Survivorship Clinic at any time; no additional follow-up needed at this time.  -Consider referral back to survivorship as a long-term survivor for continued surveillance  The patient was provided an opportunity to ask questions and all were answered. The patient agreed with the plan and demonstrated an understanding of the instructions.   Total encounter time:50 minutes*in face-to-face visit time, chart review, lab review, care coordination, order entry, and documentation of the encounter time.    Lillard Anes, NP 08/16/22 1:15 PM Medical Oncology and Hematology [facility] [address] [place], [place] [address] Tel. [phone]    Fax. [phone]  *Total Encounter Time as defined by the Centers for Medicare and Medicaid Services includes, in addition to the face-to-face time of a patient visit (documented in the note above) non-face-to-face time: obtaining and reviewing outside history, ordering and reviewing medications, tests or procedures, care coordination (communications with other health care professionals or caregivers) and documentation in the medical record.

## 2022-08-18 NOTE — Progress Notes (Signed)
Patient is a pleasant 57 year old female s/p right-sided breast reconstruction with latissimus muscle flap performed 07/26/2022 Dr. Ulice Bold who presents to clinic for postoperative follow-up.   She was last seen here in clinic on 08/10/2022.  At that time, bordered Mepilex dressing was removed from the latissimus dorsi pedicle site and there appeared to be a 1.5 x 0.25 cm incisional wound on the inferior aspect of the pedicle.  Decided to leave the back drain in place given elevated volumes approximately 90 cc/day.  Recommended Vaseline over incisions and replaced bordered Mepilex dressing over anterior chest wall.  Return in 10 days, sooner if needed.  Today, patient is accompanied by husband Onalee Hua at bedside.  Output from right upper back drain has slowed from 40 cc/day last week to 30 cc/day this week.  She states that it is still profoundly uncomfortable and she is hopeful that it could be removed.  She also endorses anterior right sided expander/flap site discomfort x 3 days described as a constant ache.  She does not feel as though it is any more swollen that it was previously.  Lastly, she noticed a bump on the medial aspect of her left mastectomy incision that she describes as itchy.  She has been applying hydrocortisone with good effect.  She continues to deny fevers or other systemic symptoms.    On exam, the right upper back incision repair site appears well healed and there is no evidence concerning for subcutaneous fluid collections.  Drain was intact and functional, normal appearing output.  Removed without complication.  As for the anterior chest where the latissimus paddle and expander are placed, no erythema or other overlying skin changes. The small incisional wound appears to be improving.  Skin is without induration, but she is particularly tender just inferior to the paddle.    Dr. Ulice Bold also evaluated patient today. Recommend continued activity modifications and compressive  garments.  No expander fill today given patient's pain and tenderness.  Instead, will give her another ten days to continue healing prior to expander fill.  Lastly, as for the lesion on the left breast, will continue to watch.  If it does not resolve spontaneously, may require biopsy.  She will call the clinic should she have any questions or concerns in the interim.    Picture(s) obtained of the patient and placed in the chart were with the patient's or guardian's permission.

## 2022-08-20 ENCOUNTER — Ambulatory Visit (INDEPENDENT_AMBULATORY_CARE_PROVIDER_SITE_OTHER): Payer: Commercial Managed Care - HMO | Admitting: Physician Assistant

## 2022-08-20 ENCOUNTER — Encounter: Payer: Self-pay | Admitting: Physician Assistant

## 2022-08-20 VITALS — BP 132/87 | HR 76

## 2022-08-20 DIAGNOSIS — C50512 Malignant neoplasm of lower-outer quadrant of left female breast: Secondary | ICD-10-CM

## 2022-08-20 DIAGNOSIS — Z9889 Other specified postprocedural states: Secondary | ICD-10-CM

## 2022-08-31 ENCOUNTER — Ambulatory Visit: Payer: Commercial Managed Care - HMO | Admitting: Physician Assistant

## 2022-08-31 ENCOUNTER — Other Ambulatory Visit (HOSPITAL_COMMUNITY): Payer: Self-pay

## 2022-08-31 VITALS — BP 135/85 | HR 88

## 2022-08-31 DIAGNOSIS — Z9889 Other specified postprocedural states: Secondary | ICD-10-CM

## 2022-08-31 DIAGNOSIS — Z171 Estrogen receptor negative status [ER-]: Secondary | ICD-10-CM

## 2022-08-31 MED ORDER — SULFAMETHOXAZOLE-TRIMETHOPRIM 800-160 MG PO TABS: 1.0000 | ORAL_TABLET | Freq: Two times a day (BID) | ORAL | 0 refills | Status: AC

## 2022-08-31 NOTE — Progress Notes (Signed)
Patient is a pleasant 57 year old female s/p right-sided breast reconstruction with latissimus muscle flap performed 07/26/2022 Dr. Ulice Bold who presents to clinic for postoperative follow-up.   She was last seen here in clinic on 08/20/2022.  At that time, right upper back incision repair site appeared well-healed and no evidence concerning for seroma.  Drain was removed given reduced volume output.  Anteriorly, exam was improving, but still optically tender just beneath the paddle.  Given her discomfort described as a "ache", no expander fill was performed.  Instead, recommended that she return in 10 days before consideration of fill.  She also noticed a bump on the medial aspect of her left mastectomy incision for which she had been applying hydrocortisone ointment.  Recommended watchful waiting and would consider biopsy if it does not improve.  Dr. Ulice Bold evaluated patient and was agreeable.    Today, patient is doing okay.  She believes that she still has about 100 cc in her left expander, but does not want to be any larger on that side.  Currently on the right expanders 20/300 cc.  She states that the left breast lesion she noted at last encounter has improved slightly with regard to pruritus.  She is no longer applying any ointments or gels to the area.  As for the right breast, she noticed a new area of redness over the most medial aspect of right breast, same area that was troublesome in the past.  She states that the discomfort has improved and is now only associated with certain movements.  She feels as though she may have a bit of fluid on the right side.  On exam, right-sided reconstruction site does appear to be slightly larger than the left.  Part of this is related to the latissimus dorsi flap, particular given that she only has 20/300 cc in the expander.  However, possible small seroma appreciated.  Attempted aspiration, but limited to directly over the port site.  Only was able to  aspirate approximately 5 cc serosanguineous fluid.  On the back, there is an area of redness inferior to her incision site that is tender to palpation.  No overlying induration.  No fluctuance or crepitus.  Given the areas of redness that are tender to palpation, will prescribe broad-spectrum antibiotics x 1 week encourage close follow-up.  She continues to deny any systemic symptoms.  The flap on the anterior chest wall appears to be healthy, good capillary refill.  Unable to aspirate any considerable amount of fluid.  She states that she does not want to be any larger than her left side, so would not proceed with expander fill and instead obtained postoperative photos.  She does understand the latissimus dorsi will atrophy in the right side will get smaller, but it does look much larger than the left today.  Will have Dr. Ulice Bold evaluate the patient at her appointment next week and discuss next steps regarding her reconstruction.  Recommending continued activity modifications and compressive garments in interim.

## 2022-09-03 ENCOUNTER — Inpatient Hospital Stay: Payer: Commercial Managed Care - HMO | Attending: Hematology and Oncology | Admitting: Gynecologic Oncology

## 2022-09-03 ENCOUNTER — Other Ambulatory Visit: Payer: Self-pay

## 2022-09-03 ENCOUNTER — Inpatient Hospital Stay (HOSPITAL_BASED_OUTPATIENT_CLINIC_OR_DEPARTMENT_OTHER): Payer: Commercial Managed Care - HMO | Admitting: Gynecologic Oncology

## 2022-09-03 ENCOUNTER — Encounter: Payer: Self-pay | Admitting: Gynecologic Oncology

## 2022-09-03 VITALS — BP 124/82 | HR 84 | Temp 97.9°F | Resp 18 | Ht 62.0 in | Wt 115.8 lb

## 2022-09-03 DIAGNOSIS — Z803 Family history of malignant neoplasm of breast: Secondary | ICD-10-CM | POA: Diagnosis not present

## 2022-09-03 DIAGNOSIS — Z1501 Genetic susceptibility to malignant neoplasm of breast: Secondary | ICD-10-CM | POA: Insufficient documentation

## 2022-09-03 DIAGNOSIS — Z9013 Acquired absence of bilateral breasts and nipples: Secondary | ICD-10-CM | POA: Diagnosis not present

## 2022-09-03 DIAGNOSIS — Z148 Genetic carrier of other disease: Secondary | ICD-10-CM | POA: Insufficient documentation

## 2022-09-03 DIAGNOSIS — Z853 Personal history of malignant neoplasm of breast: Secondary | ICD-10-CM | POA: Diagnosis not present

## 2022-09-03 DIAGNOSIS — Z1509 Genetic susceptibility to other malignant neoplasm: Secondary | ICD-10-CM

## 2022-09-03 DIAGNOSIS — Z171 Estrogen receptor negative status [ER-]: Secondary | ICD-10-CM

## 2022-09-03 DIAGNOSIS — Z8041 Family history of malignant neoplasm of ovary: Secondary | ICD-10-CM | POA: Diagnosis not present

## 2022-09-03 DIAGNOSIS — Z800 Family history of malignant neoplasm of digestive organs: Secondary | ICD-10-CM | POA: Diagnosis not present

## 2022-09-03 DIAGNOSIS — Z1502 Genetic susceptibility to malignant neoplasm of ovary: Secondary | ICD-10-CM | POA: Diagnosis not present

## 2022-09-03 MED ORDER — OXYCODONE HCL 5 MG PO TABS
5.0000 mg | ORAL_TABLET | ORAL | 0 refills | Status: DC | PRN
Start: 2022-09-03 — End: 2022-10-12

## 2022-09-03 MED ORDER — SENNOSIDES-DOCUSATE SODIUM 8.6-50 MG PO TABS
2.0000 | ORAL_TABLET | Freq: Every day | ORAL | 0 refills | Status: DC
Start: 2022-09-03 — End: 2023-03-16

## 2022-09-03 NOTE — Progress Notes (Signed)
Gynecologic Oncology Return Clinic Visit  09/03/22  Reason for Visit: treatment planning  Treatment History: Oncology History  Malignant neoplasm of lower-outer quadrant of left breast of female, estrogen receptor negative (HCC)  09/07/2021 Initial Diagnosis   Screening mammogram detected left breast asymmetry posteriorly.  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, Ki-67 70%   09/14/2021 Cancer Staging   Staging form: Breast, AJCC 8th Edition - Clinical stage from 09/14/2021: Stage IB (cT1c, cN0, cM0, G3, ER-, PR-, HER2-) - Signed by Ronny Bacon, PA-C on 09/14/2021 Stage prefix: Initial diagnosis Method of lymph node assessment: Clinical Histologic grading system: 3 grade system   09/26/2021 Genetic Testing   Pathogenic variant in BRCA2 at p.Z3664* (c.3922G>T).  Report date is September 26, 2021 (BRCAPlus) and September 29, 2021 (expanded).    The CancerNext-Expanded gene panel offered by [facility] and includes sequencing, rearrangement, and RNA analysis for the following 77 genes: AIP, ALK, APC, ATM, AXIN2, BAP1, BARD1, BLM, BMPR1A, BRCA1, BRCA2, BRIP1, CDC73, CDH1, CDK4, CDKN1B, CDKN2A, CHEK2, CTNNA1, DICER1, FANCC, FH, FLCN, GALNT12, KIF1B, LZTR1, MAX, MEN1, MET, MLH1, MSH2, MSH3, MSH6, MUTYH, NBN, NF1, NF2, NTHL1, PALB2, PHOX2B, PMS2, POT1, PRKAR1A, PTCH1, PTEN, RAD51C, RAD51D, RB1, RECQL, RET, SDHA, SDHAF2, SDHB, SDHC, SDHD, SMAD4, SMARCA4, SMARCB1, SMARCE1, STK11, SUFU, TMEM127, TP53, TSC1, TSC2, VHL and XRCC2 (sequencing and deletion/duplication); EGFR, EGLN1, HOXB13, KIT, MITF, PDGFRA, POLD1, and POLE (sequencing only); EPCAM and GREM1 (deletion/duplication only).    10/29/2021 Surgery   Bilateral mastectomies Right mastectomy: Benign, PASH Left mastectomy: Grade 3 IDC with DCIS 1.2 cm, margins negative, 0/5 sentinel lymph nodes negative, ER 0%, PR 0%, HER2 negative, Ki-67 70%   12/03/2021 -  06/03/2022 Chemotherapy   Patient is on Treatment Plan : BREAST ADJUVANT DOSE DENSE AC q14d / PACLitaxel q7d       Interval History: Doing well.  Is currently being treated with antibiotics for possible infection of a seroma after her latissimus flap.  Denies any vaginal bleeding or discharge.  Denies abdominal or pelvic pain.  Reports baseline bowel and bladder function.  Past Medical/Surgical History: Past Medical History:  Diagnosis Date   BRCA2 gene mutation positive 09/26/2021   Breast cancer (HCC) 09/2021   left breast IDC   Family history of breast cancer 09/22/2021   Family history of ovarian cancer 09/22/2021   Family history of pancreatic cancer 09/22/2021   Genetic testing 09/26/2021   Pathogenic variant in BRCA2 at p.Q0347* (c.3922G>T).  Report date is September 26, 2021.    The BRCAplus panel offered by [employer] [facility] and includes sequencing and deletion/duplication analysis for the following 8 genes: ATM, BRCA1, BRCA2, CDH1, CHEK2, PALB2, PTEN, and TP53.  Results of pan-cancer panel are pending.     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    correct Hands   Port-A-Cath in place 03/10/2022    Past Surgical History:  Procedure Laterality Date   BREAST IMPLANT REMOVAL Right 12/17/2021   Procedure: Right breast expander removal with washout;  Surgeon: Peggye Form, DO;  Location: [facility];  Service: Plastics;  Laterality: Right;   BREAST RECONSTRUCTION WITH PLACEMENT OF TISSUE EXPANDER AND FLEX HD (ACELLULAR HYDRATED DERMIS) Bilateral 10/29/2021   Procedure: BREAST RECONSTRUCTION WITH PLACEMENT OF TISSUE EXPANDER AND FLEX HD (ACELLULAR HYDRATED DERMIS);  Surgeon: Peggye Form, DO;  Location: [facility];  Service: Plastics;  Laterality: Bilateral;  BREAST RECONSTRUCTION WITH PLACEMENT OF TISSUE EXPANDER AND FLEX HD (ACELLULAR HYDRATED DERMIS) Right 07/26/2022   Procedure: PLACEMENT OF TISSUE EXPANDER;   Surgeon: Peggye Form, DO;  Location: [facility];  Service: Plastics;  Laterality: Right;   CARPAL TUNNEL RELEASE Right 06/20/2020   Procedure: CARPAL TUNNEL RELEASE;  Surgeon: Betha Loa, MD;  Location: [facility];  Service: Orthopedics;  Laterality: Right;   DILATION AND CURETTAGE OF UTERUS     IRRIGATION AND DEBRIDEMENT OF WOUND WITH SPLIT THICKNESS SKIN GRAFT Right 03/15/2022   Procedure: Debridement of right breast;  Surgeon: Peggye Form, DO;  Location: [facility];  Service: Plastics;  Laterality: Right;   LATISSIMUS FLAP TO BREAST Right 07/26/2022   Procedure: Right breast reconstruction and latissimus muscle flap;  Surgeon: Peggye Form, DO;  Location: [facility];  Service: Plastics;  Laterality: Right;   MASTECTOMY     MASTECTOMY W/ SENTINEL NODE BIOPSY Left 10/29/2021   Procedure: LEFT MASTECTOMY WITH LEFT SENTINEL LYMPH NODE BIOPSY;  Surgeon: Abigail Miyamoto, MD;  Location: [facility];  Service: General;  Laterality: Left;   PORT-A-CATH REMOVAL N/A 07/26/2022   Procedure: PORT-A-CATH REMOVAL;  Surgeon: Abigail Miyamoto, MD;  Location: [facility] OR;  Service: General;  Laterality: N/A;   PORTACATH PLACEMENT Right 10/29/2021   Procedure: INSERTION PORT-A-CATH;  Surgeon: Abigail Miyamoto, MD;  Location: [facility];  Service: General;  Laterality: Right;   TOTAL MASTECTOMY Right 10/29/2021   Procedure: RIGHT TOTAL MASTECTOMY;  Surgeon: Abigail Miyamoto, MD;  Location: [facility];  Service: General;  Laterality: Right;   TRIGGER FINGER RELEASE Left 05/08/2020   Procedure: LEFT LONG FINGER TRIGGER RELEASE AND LEFT RING FINGER TRIGGER RELEASE;  Surgeon: Betha Loa, MD;  Location: [facility];  Service: Orthopedics;  Laterality: Left;   trigger finger release rt hand      Family History  Problem Relation Age of Onset   Migraines Mother    Breast cancer Mother 63       recurr at  34   Migraines Maternal  Aunt    Colon cancer Maternal Uncle        dx late 60s   Pancreatic cancer Maternal Uncle 28   Migraines Maternal Grandmother    Ovarian cancer Maternal Grandmother        dx 64s    Social History   Socioeconomic History   Marital status: Married    Spouse name: Not on file   Number of children: Not on file   Years of education: Not on file   Highest education level: Not on file  Occupational History   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   Right handed   Social Determinants of Health   Financial Resource Strain: Not on file  Food Insecurity: Not on file  Transportation Needs: Not on file  Physical Activity: Not on file  Stress: Not on file  Social Connections: Not on file    Current Medications:  Current Outpatient Medications:    EPINEPHrine 0.3 mg/0.3 mL IJ SOAJ injection, Inject  0.3 mg into the muscle as needed for anaphylaxis., Disp: 1 each, Rfl: 1   estradiol (ESTRACE) 0.1 MG/GM vaginal cream, Place 4 g vaginally 2 (two) times a week., Disp: , Rfl:    Galcanezumab-gnlm (EMGALITY) 120 MG/ML SOAJ, Inject 120 mg into the skin every 28 (twenty-eight) days., Disp: 1 mL, Rfl: 3   ibuprofen (ADVIL) 200 MG tablet, Take 600 mg by mouth every 6 (six) hours as needed for moderate pain or headache., Disp: , Rfl:    sulfamethoxazole-trimethoprim (BACTRIM DS) 800-160 MG tablet, Take 1 tablet by mouth 2 (two) times daily for 14 days., Disp: 28 tablet, Rfl: 0  Review of Systems: Denies appetite changes, fevers, chills, fatigue, unexplained weight changes. Denies hearing loss, neck lumps or masses, mouth sores, ringing  in ears or voice changes. Denies cough or wheezing.  Denies shortness of breath. Denies chest pain or palpitations. Denies leg swelling. Denies abdominal distention, pain, blood in stools, constipation, diarrhea, nausea, vomiting, or early satiety. Denies pain with intercourse, dysuria, frequency, hematuria or incontinence. Denies hot flashes, pelvic pain, vaginal bleeding or vaginal discharge.   Denies joint pain, back pain or muscle pain/cramps. Denies itching, rash, or wounds. Denies dizziness, headaches, numbness or seizures. Denies swollen lymph nodes or glands, denies easy bruising or bleeding. Denies anxiety, depression, confusion, or decreased concentration.  Physical Exam: BP 124/82 (BP Location: Right Arm, Patient Position: Sitting)   Pulse 84   Temp 97.9 F (36.6 C) (Tympanic)   Resp 18   Ht 5\' 2"  (1.575 m)   Wt 115 lb 12.8 oz (52.5 kg)   SpO2 100%   BMI 21.18 kg/m  General: Alert, oriented, no acute distress. HEENT: Normocephalic, atraumatic, sclera anicteric. Chest: Unlabored breathing on room air. Back: Well-healing incision, some surrounding ecchymoses, skin is not warm to the touch or erythematous.  Moderate tenderness to light palpation.  Laboratory & Radiologic Studies: Component Ref Range & Units 2 mo ago  Cancer Antigen (CA) 125 0.0 - 38.1 U/mL 7.2   Pelvic ultrasound 06/2022: IMPRESSION: 1. Small uterine fibroid. 2. Normal appearance of the endometrium and left ovary. 3. Right ovary not visualized.  Assessment & Plan: Julie Townsend is a 57 y.o. woman postmenopausal patient with recent triple negative breast cancer diagnosis in the setting of a BRCA2 mutation.  She is scheduled for risk-reducing surgery later this month.  Patient is overall doing well and is ready to proceed with risk-reducing surgery.  Please see counseling from 5/17, which we reviewed today.  We discussed decision about hysterectomy and she would like to proceed with the least  surgery necessary.  Will plan for BSO only.  We reviewed the plan for robotic assisted bilateral salpingo-oophorectomy, any other indicated procedures.  Discussed the rare risk of finding a cancer at the time of surgery.  If this were the case, plan will be to send the abnormal-appearing adnexa to pathology for frozen section.  If malignancy found, additional surgery would include total hysterectomy, peritoneal biopsies, omentectomy, and possible lymph node dissection.  Also discussed the possibility of a borderline tumor which would require the same staging procedure without lymph node sampling.  The risks of surgery were discussed in detail and she understands these to include infection; wound separation; hernia; vaginal cuff separation, injury to adjacent organs such as bowel, bladder, blood vessels, ureters and nerves; bleeding which may require blood transfusion; anesthesia risk; thromboembolic events; possible death; unforeseen complications; possible need for re-exploration; medical complications such as heart attack, stroke, pleural effusion and  pneumonia; and, if full lymphadenectomy is performed the risk of lymphedema and lymphocyst. The patient will receive DVT and antibiotic prophylaxis as indicated. She voiced a clear understanding. She had the opportunity to ask questions. Perioperative instructions were reviewed with her. Prescriptions for post-op medications were sent to her pharmacy of choice.  25 minutes of total time was spent for this patient encounter, including preparation, face-to-face counseling with the patient and coordination of care, and documentation of the encounter.  Eugene Garnet, MD  Division of Gynecologic Oncology  Department of Obstetrics and Gynecology  [facility]

## 2022-09-03 NOTE — Progress Notes (Signed)
Patient here for a pre-operative appointment prior to her scheduled surgery on September 15, 2022. She is scheduled for a robotic assisted laparoscopic bilateral salpingo-oophorectomy.  She had her pre-admission testing appointment this am at [facility].  The surgery was discussed in detail.  See after visit summary for additional details. Visual aids used to discuss items related to surgery including the incentive spirometer, sequential compression stockings, foley catheter, IV pump, multi-modal pain regimen including tylenol, photo of the surgical robot, female reproductive system to discuss surgery in detail.      Discussed post-op pain management in detail including the aspects of the enhanced recovery pathway.  Advised her that a new prescription would be sent in for Oxycodone and it is only to be used for after her upcoming surgery.  We discussed the use of tylenol post-op and to monitor for a maximum of 4,000 mg in a 24 hour period.  Also prescribed sennakot to be used after surgery and to hold if having loose stools.  Discussed bowel regimen in detail.     Discussed the use of SCDs and measures to take at home to prevent DVT including frequent mobility.  Reportable signs and symptoms of DVT discussed. Post-operative instructions discussed and expectations for after surgery. Incisional care discussed as well including reportable signs and symptoms including erythema, drainage, wound separation.     30 minutes spent with the patient.  Verbalizing understanding of material discussed. No needs or concerns voiced at the end of the visit.   Advised patient to call for any needs.  Advised that her post-operative medications had been prescribed and could be picked up at any time.    This appointment is included in the global surgical bundle as pre-operative teaching and has no charge.

## 2022-09-03 NOTE — Patient Instructions (Signed)
Preparing for your Surgery  Plan for surgery on September 15, 2022 with Dr. Eugene Garnet at [facility]. You will be scheduled for robotic assisted laparoscopic bilateral salpingo-oophorectomy.   Pre-operative Testing -(Done, 8/6) You will receive a phone call from presurgical testing at [facility] to arrange for a pre-operative appointment and lab work.  -Bring your insurance card, copy of an advanced directive if applicable, medication list  -At that visit, you will be asked to sign a consent for a possible blood transfusion in case a transfusion becomes necessary during surgery.  The need for a blood transfusion is rare but having consent is a necessary part of your care.     -You should not be taking blood thinners or aspirin at least ten days prior to surgery unless instructed by your surgeon.  -Do not take supplements such as fish oil (omega 3), red yeast rice, turmeric before your surgery. You want to avoid medications with aspirin in them including headache powders such as BC or Goody's), Excedrin migraine.  Day Before Surgery at Home -You will be asked to take in a light diet the day before surgery. You will be advised you can have clear liquids up until 3 hours before your surgery.    Eat a light diet the day before surgery.  Examples including soups, broths, toast, yogurt, mashed potatoes.  AVOID GAS PRODUCING FOODS AND BEVERAGES. Things to avoid include carbonated beverages (fizzy beverages, sodas), raw fruits and raw vegetables (uncooked), or beans.   If your bowels are filled with gas, your surgeon will have difficulty visualizing your pelvic organs which increases your surgical risks.  Your role in recovery Your role is to become active as soon as directed by your doctor, while still giving yourself time to heal.  Rest when you feel tired. You will be asked to do the following in order to speed your recovery:  - Cough and breathe deeply. This helps to clear  and expand your lungs and can prevent pneumonia after surgery.  - STAY ACTIVE WHEN YOU GET HOME. Do mild physical activity. Walking or moving your legs help your circulation and body functions return to normal. Do not try to get up or walk alone the first time after surgery.   -If you develop swelling on one leg or the other, pain in the back of your leg, redness/warmth in one of your legs, please call the office or go to the Emergency Room to have a doppler to rule out a blood clot. For shortness of breath, chest pain-seek care in the Emergency Room as soon as possible. - Actively manage your pain. Managing your pain lets you move in comfort. We will ask you to rate your pain on a scale of zero to 10. It is your responsibility to tell your doctor or nurse where and how much you hurt so your pain can be treated.  Special Considerations -If you are diabetic, you may be placed on insulin after surgery to have closer control over your blood sugars to promote healing and recovery.  This does not mean that you will be discharged on insulin.  If applicable, your oral antidiabetics will be resumed when you are tolerating a solid diet.  -Your final pathology results from surgery should be available around one week after surgery and the results will be relayed to you when available.  -FMLA forms can be faxed to [phone] and please allow 5-7 business days for completion.  Pain Management After Surgery -You  have been prescribed your pain medication and bowel regimen medications before surgery so that you can have these available when you are discharged from the hospital. The pain medication is for use ONLY AFTER surgery and a new prescription will not be given.   -Make sure that you have Tylenol and Ibuprofen IF YOU ARE ABLE TO TAKE THESE MEDICATIONS at home to use on a regular basis after surgery for pain control. We recommend alternating the medications every hour to six hours since they work differently  and are processed in the body differently for pain relief.  -Review the attached handout on narcotic use and their risks and side effects.   Bowel Regimen -You have been prescribed Sennakot-S to take nightly to prevent constipation especially if you are taking the narcotic pain medication intermittently.  It is important to prevent constipation and drink adequate amounts of liquids. You can stop taking this medication when you are not taking pain medication and you are back on your normal bowel routine.  Risks of Surgery Risks of surgery are low but include bleeding, infection, damage to surrounding structures, re-operation, blood clots, and very rarely death.   Blood Transfusion Information (For the consent to be signed before surgery)  We will be checking your blood type before surgery so in case of emergencies, we will know what type of blood you would need.                                            WHAT IS A BLOOD TRANSFUSION?  A transfusion is the replacement of blood or some of its parts. Blood is made up of multiple cells which provide different functions. Red blood cells carry oxygen and are used for blood loss replacement. White blood cells fight against infection. Platelets control bleeding. Plasma helps clot blood. Other blood products are available for specialized needs, such as hemophilia or other clotting disorders. BEFORE THE TRANSFUSION  Who gives blood for transfusions?  You may be able to donate blood to be used at a later date on yourself (autologous donation). Relatives can be asked to donate blood. This is generally not any safer than if you have received blood from a stranger. The same precautions are taken to ensure safety when a relative's blood is donated. Healthy volunteers who are fully evaluated to make sure their blood is safe. This is blood bank blood. Transfusion therapy is the safest it has ever been in the practice of medicine. Before blood is taken from  a donor, a complete history is taken to make sure that person has no history of diseases nor engages in risky social behavior (examples are intravenous drug use or sexual activity with multiple partners). The donor's travel history is screened to minimize risk of transmitting infections, such as malaria. The donated blood is tested for signs of infectious diseases, such as HIV and hepatitis. The blood is then tested to be sure it is compatible with you in order to minimize the chance of a transfusion reaction. If you or a relative donates blood, this is often done in anticipation of surgery and is not appropriate for emergency situations. It takes many days to process the donated blood. RISKS AND COMPLICATIONS Although transfusion therapy is very safe and saves many lives, the main dangers of transfusion include:  Getting an infectious disease. Developing a transfusion reaction. This is an allergic reaction  to something in the blood you were given. Every precaution is taken to prevent this. The decision to have a blood transfusion has been considered carefully by your caregiver before blood is given. Blood is not given unless the benefits outweigh the risks.  AFTER SURGERY INSTRUCTIONS  Return to work: 4-6 weeks if applicable  Activity: 1. Be up and out of the bed during the day.  Take a nap if needed.  You may walk up steps but be careful and use the hand rail.  Stair climbing will tire you more than you think, you may need to stop part way and rest.   2. No lifting or straining for 6 weeks over 10 pounds. No pushing, pulling, straining for 6 weeks.  3. No driving for around 1 week(s).  Do not drive if you are taking narcotic pain medicine and make sure that your reaction time has returned.   4. You can shower as soon as the next day after surgery. Shower daily.  Use your regular soap and water (not directly on the incision) and pat your incision(s) dry afterwards; don't rub.  No tub baths or  submerging your body in water until cleared by your surgeon. If you have the soap that was given to you by pre-surgical testing that was used before surgery, you do not need to use it afterwards because this can irritate your incisions.   5. No sexual activity and nothing in the vagina for 4-6 weeks.  6. You may experience a small amount of clear drainage from your incisions, which is normal.  If the drainage persists, increases, or changes color please call the office.  7. Do not use creams, lotions, or ointments such as neosporin on your incisions after surgery until advised by your surgeon because they can cause removal of the dermabond glue on your incisions.    8. You may experience vaginal spotting after surgery.  The spotting is normal but if you experience heavy bleeding, call our office.  9. Take Tylenol or ibuprofen first for pain if you are able to take these medications and only use narcotic pain medication for severe pain not relieved by the Tylenol or Ibuprofen.  Monitor your Tylenol intake to a max of 4,000 mg in a 24 hour period. You can alternate these medications after surgery.  Diet: 1. Low sodium Heart Healthy Diet is recommended but you are cleared to resume your normal (before surgery) diet after your procedure.  2. It is safe to use a laxative, such as Miralax or Colace, if you have difficulty moving your bowels. You have been prescribed Sennakot-S to take at bedtime every evening after surgery to keep bowel movements regular and to prevent constipation.    Wound Care: 1. Keep clean and dry.  Shower daily.  Reasons to call the Doctor: Fever - Oral temperature greater than 100.4 degrees Fahrenheit Foul-smelling vaginal discharge Difficulty urinating Nausea and vomiting Increased pain at the site of the incision that is unrelieved with pain medicine. Difficulty breathing with or without chest pain New calf pain especially if only on one side Sudden, continuing  increased vaginal bleeding with or without clots.   Contacts: For questions or concerns you should contact:  Dr. Eugene Garnet at [phone]  Warner Mccreedy, NP at [phone]  After Hours: call [phone] and have the GYN Oncologist paged/contacted (after 5 pm or on the weekends). You will speak with an after hours RN and let he or she know you have had surgery.  Messages sent via mychart are for non-urgent matters and are not responded to after hours so for urgent needs, please call the after hours number.

## 2022-09-03 NOTE — H&P (View-Only) (Signed)
 Gynecologic Oncology Return Clinic Visit  09/03/22  Reason for Visit: treatment planning  Treatment History: Oncology History  Malignant neoplasm of lower-outer quadrant of left breast of female, estrogen receptor negative (HCC)  09/07/2021 Initial Diagnosis   Screening mammogram detected left breast asymmetry posteriorly.  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, Ki-67 70%   09/14/2021 Cancer Staging   Staging form: Breast, AJCC 8th Edition - Clinical stage from 09/14/2021: Stage IB (cT1c, cN0, cM0, G3, ER-, PR-, HER2-) - Signed by Ronny Bacon, PA-C on 09/14/2021 Stage prefix: Initial diagnosis Method of lymph node assessment: Clinical Histologic grading system: 3 grade system   09/26/2021 Genetic Testing   Pathogenic variant in BRCA2 at p.Z3664* (c.3922G>T).  Report date is September 26, 2021 (BRCAPlus) and September 29, 2021 (expanded).    The CancerNext-Expanded gene panel offered by [facility] and includes sequencing, rearrangement, and RNA analysis for the following 77 genes: AIP, ALK, APC, ATM, AXIN2, BAP1, BARD1, BLM, BMPR1A, BRCA1, BRCA2, BRIP1, CDC73, CDH1, CDK4, CDKN1B, CDKN2A, CHEK2, CTNNA1, DICER1, FANCC, FH, FLCN, GALNT12, KIF1B, LZTR1, MAX, MEN1, MET, MLH1, MSH2, MSH3, MSH6, MUTYH, NBN, NF1, NF2, NTHL1, PALB2, PHOX2B, PMS2, POT1, PRKAR1A, PTCH1, PTEN, RAD51C, RAD51D, RB1, RECQL, RET, SDHA, SDHAF2, SDHB, SDHC, SDHD, SMAD4, SMARCA4, SMARCB1, SMARCE1, STK11, SUFU, TMEM127, TP53, TSC1, TSC2, VHL and XRCC2 (sequencing and deletion/duplication); EGFR, EGLN1, HOXB13, KIT, MITF, PDGFRA, POLD1, and POLE (sequencing only); EPCAM and GREM1 (deletion/duplication only).    10/29/2021 Surgery   Bilateral mastectomies Right mastectomy: Benign, PASH Left mastectomy: Grade 3 IDC with DCIS 1.2 cm, margins negative, 0/5 sentinel lymph nodes negative, ER 0%, PR 0%, HER2 negative, Ki-67 70%   12/03/2021 -  06/03/2022 Chemotherapy   Patient is on Treatment Plan : BREAST ADJUVANT DOSE DENSE AC q14d / PACLitaxel q7d       Interval History: Doing well.  Is currently being treated with antibiotics for possible infection of a seroma after her latissimus flap.  Denies any vaginal bleeding or discharge.  Denies abdominal or pelvic pain.  Reports baseline bowel and bladder function.  Past Medical/Surgical History: Past Medical History:  Diagnosis Date   BRCA2 gene mutation positive 09/26/2021   Breast cancer (HCC) 09/2021   left breast IDC   Family history of breast cancer 09/22/2021   Family history of ovarian cancer 09/22/2021   Family history of pancreatic cancer 09/22/2021   Genetic testing 09/26/2021   Pathogenic variant in BRCA2 at p.Q0347* (c.3922G>T).  Report date is September 26, 2021.    The BRCAplus panel offered by [employer] and includes sequencing and deletion/duplication analysis for the following 8 genes: ATM, BRCA1, BRCA2, CDH1, CHEK2, PALB2, PTEN, and TP53.  Results of pan-cancer panel are pending.     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    correct Hands   Port-A-Cath in place 03/10/2022    Past Surgical History:  Procedure Laterality Date   BREAST IMPLANT REMOVAL Right 12/17/2021   Procedure: Right breast expander removal with washout;  Surgeon: Peggye Form, DO;  Location: [facility];  Service: Plastics;  Laterality: Right;   BREAST RECONSTRUCTION WITH PLACEMENT OF TISSUE EXPANDER AND FLEX HD (ACELLULAR HYDRATED DERMIS) Bilateral 10/29/2021   Procedure: BREAST RECONSTRUCTION WITH PLACEMENT OF TISSUE EXPANDER AND FLEX HD (ACELLULAR HYDRATED DERMIS);  Surgeon: Peggye Form, DO;  Location: [facility];  Service: Plastics;  Laterality: Bilateral;  BREAST RECONSTRUCTION WITH PLACEMENT OF TISSUE EXPANDER AND FLEX HD (ACELLULAR HYDRATED DERMIS) Right 07/26/2022   Procedure: PLACEMENT OF TISSUE EXPANDER;   Surgeon: Peggye Form, DO;  Location: [facility];  Service: Plastics;  Laterality: Right;   CARPAL TUNNEL RELEASE Right 06/20/2020   Procedure: CARPAL TUNNEL RELEASE;  Surgeon: Betha Loa, MD;  Location: [facility];  Service: Orthopedics;  Laterality: Right;   DILATION AND CURETTAGE OF UTERUS     IRRIGATION AND DEBRIDEMENT OF WOUND WITH SPLIT THICKNESS SKIN GRAFT Right 03/15/2022   Procedure: Debridement of right breast;  Surgeon: Peggye Form, DO;  Location: [facility];  Service: Plastics;  Laterality: Right;   LATISSIMUS FLAP TO BREAST Right 07/26/2022   Procedure: Right breast reconstruction and latissimus muscle flap;  Surgeon: Peggye Form, DO;  Location: [facility];  Service: Plastics;  Laterality: Right;   MASTECTOMY     MASTECTOMY W/ SENTINEL NODE BIOPSY Left 10/29/2021   Procedure: LEFT MASTECTOMY WITH LEFT SENTINEL LYMPH NODE BIOPSY;  Surgeon: Abigail Miyamoto, MD;  Location: [facility];  Service: General;  Laterality: Left;   PORT-A-CATH REMOVAL N/A 07/26/2022   Procedure: PORT-A-CATH REMOVAL;  Surgeon: Abigail Miyamoto, MD;  Location: [facility] OR;  Service: General;  Laterality: N/A;   PORTACATH PLACEMENT Right 10/29/2021   Procedure: INSERTION PORT-A-CATH;  Surgeon: Abigail Miyamoto, MD;  Location: [facility];  Service: General;  Laterality: Right;   TOTAL MASTECTOMY Right 10/29/2021   Procedure: RIGHT TOTAL MASTECTOMY;  Surgeon: Abigail Miyamoto, MD;  Location: [facility];  Service: General;  Laterality: Right;   TRIGGER FINGER RELEASE Left 05/08/2020   Procedure: LEFT LONG FINGER TRIGGER RELEASE AND LEFT RING FINGER TRIGGER RELEASE;  Surgeon: Betha Loa, MD;  Location: [facility];  Service: Orthopedics;  Laterality: Left;   trigger finger release rt hand      Family History  Problem Relation Age of Onset   Migraines Mother    Breast cancer Mother 63       recurr at  34   Migraines Maternal  Aunt    Colon cancer Maternal Uncle        dx late 60s   Pancreatic cancer Maternal Uncle 28   Migraines Maternal Grandmother    Ovarian cancer Maternal Grandmother        dx 64s    Social History   Socioeconomic History   Marital status: Married    Spouse name: Not on file   Number of children: Not on file   Years of education: Not on file   Highest education level: Not on file  Occupational History   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   Right handed   Social Determinants of Health   Financial Resource Strain: Not on file  Food Insecurity: Not on file  Transportation Needs: Not on file  Physical Activity: Not on file  Stress: Not on file  Social Connections: Not on file    Current Medications:  Current Outpatient Medications:    EPINEPHrine 0.3 mg/0.3 mL IJ SOAJ injection, Inject  0.3 mg into the muscle as needed for anaphylaxis., Disp: 1 each, Rfl: 1   estradiol (ESTRACE) 0.1 MG/GM vaginal cream, Place 4 g vaginally 2 (two) times a week., Disp: , Rfl:    Galcanezumab-gnlm (EMGALITY) 120 MG/ML SOAJ, Inject 120 mg into the skin every 28 (twenty-eight) days., Disp: 1 mL, Rfl: 3   ibuprofen (ADVIL) 200 MG tablet, Take 600 mg by mouth every 6 (six) hours as needed for moderate pain or headache., Disp: , Rfl:    sulfamethoxazole-trimethoprim (BACTRIM DS) 800-160 MG tablet, Take 1 tablet by mouth 2 (two) times daily for 14 days., Disp: 28 tablet, Rfl: 0  Review of Systems: Denies appetite changes, fevers, chills, fatigue, unexplained weight changes. Denies hearing loss, neck lumps or masses, mouth sores, ringing  in ears or voice changes. Denies cough or wheezing.  Denies shortness of breath. Denies chest pain or palpitations. Denies leg swelling. Denies abdominal distention, pain, blood in stools, constipation, diarrhea, nausea, vomiting, or early satiety. Denies pain with intercourse, dysuria, frequency, hematuria or incontinence. Denies hot flashes, pelvic pain, vaginal bleeding or vaginal discharge.   Denies joint pain, back pain or muscle pain/cramps. Denies itching, rash, or wounds. Denies dizziness, headaches, numbness or seizures. Denies swollen lymph nodes or glands, denies easy bruising or bleeding. Denies anxiety, depression, confusion, or decreased concentration.  Physical Exam: BP 124/82 (BP Location: Right Arm, Patient Position: Sitting)   Pulse 84   Temp 97.9 F (36.6 C) (Tympanic)   Resp 18   Ht 5\' 2"  (1.575 m)   Wt 115 lb 12.8 oz (52.5 kg)   SpO2 100%   BMI 21.18 kg/m  General: Alert, oriented, no acute distress. HEENT: Normocephalic, atraumatic, sclera anicteric. Chest: Unlabored breathing on room air. Back: Well-healing incision, some surrounding ecchymoses, skin is not warm to the touch or erythematous.  Moderate tenderness to light palpation.  Laboratory & Radiologic Studies: Component Ref Range & Units 2 mo ago  Cancer Antigen (CA) 125 0.0 - 38.1 U/mL 7.2   Pelvic ultrasound 06/2022: IMPRESSION: 1. Small uterine fibroid. 2. Normal appearance of the endometrium and left ovary. 3. Right ovary not visualized.  Assessment & Plan: Julie Townsend is a 57 y.o. woman postmenopausal patient with recent triple negative breast cancer diagnosis in the setting of a BRCA2 mutation.  She is scheduled for risk-reducing surgery later this month.  Patient is overall doing well and is ready to proceed with risk-reducing surgery.  Please see counseling from 5/17, which we reviewed today.  We discussed decision about hysterectomy and she would like to proceed with the least  surgery necessary.  Will plan for BSO only.  We reviewed the plan for robotic assisted bilateral salpingo-oophorectomy, any other indicated procedures.  Discussed the rare risk of finding a cancer at the time of surgery.  If this were the case, plan will be to send the abnormal-appearing adnexa to pathology for frozen section.  If malignancy found, additional surgery would include total hysterectomy, peritoneal biopsies, omentectomy, and possible lymph node dissection.  Also discussed the possibility of a borderline tumor which would require the same staging procedure without lymph node sampling.  The risks of surgery were discussed in detail and she understands these to include infection; wound separation; hernia; vaginal cuff separation, injury to adjacent organs such as bowel, bladder, blood vessels, ureters and nerves; bleeding which may require blood transfusion; anesthesia risk; thromboembolic events; possible death; unforeseen complications; possible need for re-exploration; medical complications such as heart attack, stroke, pleural effusion and  pneumonia; and, if full lymphadenectomy is performed the risk of lymphedema and lymphocyst. The patient will receive DVT and antibiotic prophylaxis as indicated. She voiced a clear understanding. She had the opportunity to ask questions. Perioperative instructions were reviewed with her. Prescriptions for post-op medications were sent to her pharmacy of choice.  25 minutes of total time was spent for this patient encounter, including preparation, face-to-face counseling with the patient and coordination of care, and documentation of the encounter.  Eugene Garnet, MD  Division of Gynecologic Oncology  Department of Obstetrics and Gynecology  [facility]

## 2022-09-06 NOTE — Progress Notes (Addendum)
COVID Vaccine Completed:  Date of COVID positive in last 90 days:  PCP -  Cardiologist -   Chest x-ray -  EKG -  Stress Test -  ECHO - 09/22/21 Epic Cardiac Cath -  Pacemaker/ICD device last checked: Spinal Cord Stimulator:  Bowel Prep -   Sleep Study -  CPAP -   Fasting Blood Sugar -  Checks Blood Sugar _____ times a day  Last dose of GLP1 agonist-  N/A GLP1 instructions:  N/A   Last dose of SGLT-2 inhibitors-  N/A SGLT-2 instructions: N/A   Blood Thinner Instructions:  Time Aspirin Instructions: Last Dose:  Activity level:  Can go up a flight of stairs and perform activities of daily living without stopping and without symptoms of chest pain or shortness of breath.  Able to exercise without symptoms  Unable to go up a flight of stairs without symptoms of     Anesthesia review:   Patient denies shortness of breath, fever, cough and chest pain at PAT appointment  Patient verbalized understanding of instructions that were given to them at the PAT appointment. Patient was also instructed that they will need to review over the PAT instructions again at home before surgery.

## 2022-09-07 ENCOUNTER — Encounter (HOSPITAL_COMMUNITY): Payer: Self-pay

## 2022-09-07 ENCOUNTER — Other Ambulatory Visit: Payer: Self-pay

## 2022-09-07 ENCOUNTER — Encounter (HOSPITAL_COMMUNITY)
Admission: RE | Admit: 2022-09-07 | Discharge: 2022-09-07 | Disposition: A | Discharge status: Home / Self Care | Payer: Commercial Managed Care - HMO | Source: Ambulatory Visit | Attending: Gynecologic Oncology | Admitting: Gynecologic Oncology

## 2022-09-07 ENCOUNTER — Ambulatory Visit (INDEPENDENT_AMBULATORY_CARE_PROVIDER_SITE_OTHER): Payer: Commercial Managed Care - HMO | Admitting: Physician Assistant

## 2022-09-07 DIAGNOSIS — Z171 Estrogen receptor negative status [ER-]: Secondary | ICD-10-CM | POA: Insufficient documentation

## 2022-09-07 DIAGNOSIS — Z1501 Genetic susceptibility to malignant neoplasm of breast: Secondary | ICD-10-CM | POA: Insufficient documentation

## 2022-09-07 DIAGNOSIS — Z01812 Encounter for preprocedural laboratory examination: Secondary | ICD-10-CM | POA: Insufficient documentation

## 2022-09-07 DIAGNOSIS — Z1509 Genetic susceptibility to other malignant neoplasm: Secondary | ICD-10-CM | POA: Diagnosis not present

## 2022-09-07 DIAGNOSIS — Z01818 Encounter for other preprocedural examination: Secondary | ICD-10-CM | POA: Diagnosis present

## 2022-09-07 DIAGNOSIS — Z9889 Other specified postprocedural states: Secondary | ICD-10-CM

## 2022-09-07 DIAGNOSIS — C50512 Malignant neoplasm of lower-outer quadrant of left female breast: Secondary | ICD-10-CM | POA: Insufficient documentation

## 2022-09-07 HISTORY — DX: Family history of malignant neoplasm of prostate: Z80.42

## 2022-09-07 HISTORY — DX: Family history of malignant neoplasm of digestive organs: Z80.0

## 2022-09-07 LAB — COMPREHENSIVE METABOLIC PANEL
ALT: 10 U/L (ref 0–44)
AST: 17 U/L (ref 15–41)
Albumin: 3.6 g/dL (ref 3.5–5.0)
Alkaline Phosphatase: 73 U/L (ref 38–126)
Anion gap: 10 (ref 5–15)
BUN: 11 mg/dL (ref 6–20)
CO2: 24 mmol/L (ref 22–32)
Calcium: 9.2 mg/dL (ref 8.9–10.3)
Chloride: 103 mmol/L (ref 98–111)
Creatinine, Ser: 0.79 mg/dL (ref 0.44–1.00)
GFR, Estimated: >60 mL/min (ref >60)
Glucose, Bld: 99 mg/dL (ref 70–99)
Potassium: 4.4 mmol/L (ref 3.5–5.1)
Sodium: 137 mmol/L (ref 135–145)
Total Bilirubin: 0.3 mg/dL (ref 0.3–1.2)
Total Protein: 7.4 g/dL (ref 6.5–8.1)

## 2022-09-07 LAB — CBC WITH DIFFERENTIAL/PLATELET
Abs Immature Granulocytes: 0.03 10*3/uL (ref 0.00–0.07)
Basophils Absolute: 0 10*3/uL (ref 0.0–0.1)
Basophils Relative: 1 %
Eosinophils Absolute: 0.1 10*3/uL (ref 0.0–0.5)
Eosinophils Relative: 3 %
HCT: 38.7 % (ref 36.0–46.0)
Hemoglobin: 12 g/dL (ref 12.0–15.0)
Immature Granulocytes: 1 %
Lymphocytes Relative: 28 %
Lymphs Abs: 1.3 10*3/uL (ref 0.7–4.0)
MCH: 29.9 pg (ref 26.0–34.0)
MCHC: 31 g/dL (ref 30.0–36.0)
MCV: 96.3 fL (ref 80.0–100.0)
Monocytes Absolute: 0.3 10*3/uL (ref 0.1–1.0)
Monocytes Relative: 7 %
Neutro Abs: 2.9 10*3/uL (ref 1.7–7.7)
Neutrophils Relative %: 60 %
Platelets: 412 10*3/uL — ABNORMAL HIGH (ref 150–400)
RBC: 4.02 MIL/uL (ref 3.87–5.11)
RDW: 13.6 % (ref 11.5–15.5)
WBC: 4.6 10*3/uL (ref 4.0–10.5)
nRBC: 0 % (ref 0.0–0.2)

## 2022-09-07 LAB — TYPE AND SCREEN
ABO/RH(D): A NEG
Antibody Screen: NEGATIVE

## 2022-09-07 NOTE — Progress Notes (Signed)
Patient is a pleasant 57 year old female s/p right-sided breast reconstruction with latissimus muscle flap performed 07/26/2022 Dr. Ulice Bold who presents to clinic for postoperative follow-up.   She was last seen here in clinic on 08/31/2022.  At that time, she noticed redness over the medial aspect of her right breast.  Some discomfort and tenderness.  She also had a few areas on the back that were red and tender.  Prescribed antibiotics and encouraged close follow-up in 1 week.  Current expander volumes equals 100 cc on the left and 20/300 cc on the right.  She states that she does not want to be any larger.  Today, patient is companied by her husband at bedside.  She states that her right-sided low back redness and tenderness to palpation has resolved since initiating antibiotics after last encounter.  Unfortunately, the area of redness over medial aspect of her right breast she feels has gotten a little bit worse.  It remains nontender and is not swollen, but she feels as though the redness has gotten slightly larger.  Her husband has not medically noticed a difference.  The lesion on the medial aspect of left mastectomy incision remains itchy.  She confirms that she does not want to be any bigger than she already is on the left and is looking forward to proceeding with implant exchange.  On exam, expanders are appropriately placed.  Right sided latissimus dorsi harvest site appears to be well-healed.  No ongoing tenderness to palpation in the right lumbar region.  As for the latissimus dorsi paddle on the anterior chest wall, there is some erythema at the most medial aspect.  This is exactly where she had her previous wounds and infections.  There is no induration, peeling, crepitus, or obvious fluctuance/subcutaneous fluid collection.  No drainage or wounds noted.  Dr. Ulice Bold personally evaluated patient at bedside.  Given the redness, advised against expander fill on the right today, but would  like her to receive an expander fill in 7 days.  Recommending oral antihistamines in the event that it is partially a histamine response.  This will also help with her left mastectomy site pruritus.  Do not need to extend her antibiotics.  She continues to deny any systemic symptoms.  She will still want patient to have an expander fill on the right side prior to the implant exchange, but does not want to proceed towards that phase of reconstruction until redness has improved.  Return in 1 to 2 weeks.  Picture(s) obtained of the patient and placed in the chart were with the patient's or guardian's permission.

## 2022-09-07 NOTE — Patient Instructions (Addendum)
SURGICAL WAITING ROOM VISITATION  Patients having surgery or a procedure may have no more than 2 support people in the waiting area - these visitors may rotate.    Children under the age of 34 must have an adult with them who is not the patient.  Due to an increase in RSV and influenza rates and associated hospitalizations, children ages 66 and under may not visit patients in [facility] hospitals.  If the patient needs to stay at the hospital during part of their recovery, the visitor guidelines for inpatient rooms apply. Pre-op nurse will coordinate an appropriate time for 1 support person to accompany patient in pre-op.  This support person may not rotate.    Please refer to the [facility] website for the visitor guidelines for Inpatients (after your surgery is over and you are in a regular room).    Your procedure is scheduled on: 09/15/22   Report to [facility] Main Entrance    Report to admitting at 6:15 AM   Call this number if you have problems the morning of surgery [phone]   Do not eat food :After Midnight.   After Midnight you may have the following liquids until 5:30 AM DAY OF SURGERY  Water Non-Citrus Juices (without pulp, NO RED-Apple, White grape, White cranberry) Black Coffee (NO MILK/CREAM OR CREAMERS, sugar ok)  Clear Tea (NO MILK/CREAM OR CREAMERS, sugar ok) regular and decaf                             Plain Jell-O (NO RED)                                           Fruit ices (not with fruit pulp, NO RED)                                     Popsicles (NO RED)                                                               Sports drinks like Gatorade (NO RED)                      If you have questions, please contact your surgeon's office.   FOLLOW BOWEL PREP AND ANY ADDITIONAL PRE OP INSTRUCTIONS YOU RECEIVED FROM YOUR SURGEON'S OFFICE!!!     Oral Hygiene is also important to reduce your risk of infection.                                     Remember - BRUSH YOUR TEETH THE MORNING OF SURGERY WITH YOUR REGULAR TOOTHPASTE  DENTURES WILL BE REMOVED PRIOR TO SURGERY PLEASE DO NOT APPLY "Poly grip" OR ADHESIVES!!!   Stop all vitamins and herbal supplements 7 days before surgery.   Take these medicines the morning of surgery with A SIP OF WATER: None  You may not have any metal on your body including hair pins, jewelry, and body piercing             Do not wear make-up, lotions, powders, perfumes, or deodorant  Do not wear nail polish including gel and S&S, artificial/acrylic nails, or any other type of covering on natural nails including finger and toenails. If you have artificial nails, gel coating, etc. that needs to be removed by a nail salon please have this removed prior to surgery or surgery may need to be canceled/ delayed if the surgeon/ anesthesia feels like they are unable to be safely monitored.   Do not shave  48 hours prior to surgery.    Do not bring valuables to the hospital. [facility] IS NOT             RESPONSIBLE   FOR VALUABLES.   Contacts, glasses, dentures or bridgework may not be worn into surgery.  DO NOT BRING YOUR HOME MEDICATIONS TO THE HOSPITAL. PHARMACY WILL DISPENSE MEDICATIONS LISTED ON YOUR MEDICATION LIST TO YOU DURING YOUR ADMISSION IN THE HOSPITAL!    Patients discharged on the day of surgery will not be allowed to drive home.  Someone NEEDS to stay with you for the first 24 hours after anesthesia.              Please read over the following fact sheets you were given: IF YOU HAVE QUESTIONS ABOUT YOUR PRE-OP INSTRUCTIONS PLEASE CALL [phone] Contras   If you received a COVID test during your pre-op visit  it is requested that you wear a mask when out in public, stay away from anyone that may not be feeling well and notify your surgeon if you develop symptoms. If you test positive for Covid or have been in contact with anyone that has tested positive in the last  10 days please notify you surgeon.    [facility] - Preparing for Surgery Before surgery, you can play an important role.  Because skin is not sterile, your skin needs to be as free of germs as possible.  You can reduce the number of germs on your skin by washing with CHG (chlorahexidine gluconate) soap before surgery.  CHG is an antiseptic cleaner which kills germs and bonds with the skin to continue killing germs even after washing. Please DO NOT use if you have an allergy to CHG or antibacterial soaps.  If your skin becomes reddened/irritated stop using the CHG and inform your nurse when you arrive at Short Stay. Do not shave (including legs and underarms) for at least 48 hours prior to the first CHG shower.  You may shave your face/neck.  Please follow these instructions carefully:  1.  Shower with CHG Soap the night before surgery and the  morning of surgery.  2.  If you choose to wash your hair, wash your hair first as usual with your normal  shampoo.  3.  After you shampoo, rinse your hair and body thoroughly to remove the shampoo.                             4.  Use CHG as you would any other liquid soap.  You can apply chg directly to the skin and wash.  Gently with a scrungie or clean washcloth.  5.  Apply the CHG Soap to your body ONLY FROM THE NECK DOWN.   Do   not use on face/  open                           Wound or open sores. Avoid contact with eyes, ears mouth and   genitals (private parts).                       Wash face,  Genitals (private parts) with your normal soap.             6.  Wash thoroughly, paying special attention to the area where your    surgery  will be performed.  7.  Thoroughly rinse your body with warm water from the neck down.  8.  DO NOT shower/wash with your normal soap after using and rinsing off the CHG Soap.                9.  Pat yourself dry with a clean towel.            10.  Wear clean pajamas.            11.  Place clean sheets on your bed the night  of your first shower and do not  sleep with pets. Day of Surgery : Do not apply any lotions/deodorants the morning of surgery.  Please wear clean clothes to the hospital/surgery center.  FAILURE TO FOLLOW THESE INSTRUCTIONS MAY RESULT IN THE CANCELLATION OF YOUR SURGERY  PATIENT SIGNATURE_________________________________  NURSE SIGNATURE__________________________________  ________________________________________________________________________ WHAT IS A BLOOD TRANSFUSION? Blood Transfusion Information  A transfusion is the replacement of blood or some of its parts. Blood is made up of multiple cells which provide different functions. Red blood cells carry oxygen and are used for blood loss replacement. White blood cells fight against infection. Platelets control bleeding. Plasma helps clot blood. Other blood products are available for specialized needs, such as hemophilia or other clotting disorders. BEFORE THE TRANSFUSION  Who gives blood for transfusions?  Healthy volunteers who are fully evaluated to make sure their blood is safe. This is blood bank blood. Transfusion therapy is the safest it has ever been in the practice of medicine. Before blood is taken from a donor, a complete history is taken to make sure that person has no history of diseases nor engages in risky social behavior (examples are intravenous drug use or sexual activity with multiple partners). The donor's travel history is screened to minimize risk of transmitting infections, such as malaria. The donated blood is tested for signs of infectious diseases, such as HIV and hepatitis. The blood is then tested to be sure it is compatible with you in order to minimize the chance of a transfusion reaction. If you or a relative donates blood, this is often done in anticipation of surgery and is not appropriate for emergency situations. It takes many days to process the donated blood. RISKS AND COMPLICATIONS Although transfusion  therapy is very safe and saves many lives, the main dangers of transfusion include:  Getting an infectious disease. Developing a transfusion reaction. This is an allergic reaction to something in the blood you were given. Every precaution is taken to prevent this. The decision to have a blood transfusion has been considered carefully by your caregiver before blood is given. Blood is not given unless the benefits outweigh the risks. AFTER THE TRANSFUSION Right after receiving a blood transfusion, you will usually feel much better and more energetic. This is especially true if your red blood cells have gotten low (  anemic). The transfusion raises the level of the red blood cells which carry oxygen, and this usually causes an energy increase. The nurse administering the transfusion will monitor you carefully for complications. HOME CARE INSTRUCTIONS  No special instructions are needed after a transfusion. You may find your energy is better. Speak with your caregiver about any limitations on activity for underlying diseases you may have. SEEK MEDICAL CARE IF:  Your condition is not improving after your transfusion. You develop redness or irritation at the intravenous (IV) site. SEEK IMMEDIATE MEDICAL CARE IF:  Any of the following symptoms occur over the next 12 hours: Shaking chills. You have a temperature by mouth above 102 F (38.9 C), not controlled by medicine. Chest, back, or muscle pain. People around you feel you are not acting correctly or are confused. Shortness of breath or difficulty breathing. Dizziness and fainting. You get a rash or develop hives. You have a decrease in urine output. Your urine turns a dark color or changes to pink, red, or brown. Any of the following symptoms occur over the next 10 days: You have a temperature by mouth above 102 F (38.9 C), not controlled by medicine. Shortness of breath. Weakness after normal activity. The white part of the eye turns yellow  (jaundice). You have a decrease in the amount of urine or are urinating less often. Your urine turns a dark color or changes to pink, red, or brown. Document Released: 01/16/2000 Document Revised: 04/12/2011 Document Reviewed: 09/04/2007 [facility] Patient Information 2014 [place], [place].  _______________________________________________________________________

## 2022-09-07 NOTE — Progress Notes (Signed)
COVID Vaccine Completed: no   Date of COVID positive in last 90 days: no   PCP - n/a Cardiologist - n/a   Chest x-ray - n/a EKG - n/a Stress Test - n/a ECHO - 09/22/21 Epic Cardiac Cath - n/a Pacemaker/ICD device last checked: n/a Spinal Cord Stimulator: n/a   Bowel Prep - no   Sleep Study - n/a CPAP -    Fasting Blood Sugar - n/a Checks Blood Sugar _____ times a day   Last dose of GLP1 agonist-  N/A GLP1 instructions:  N/A   Last dose of SGLT-2 inhibitors-  N/A SGLT-2 instructions: N/A     Blood Thinner Instructions:  n/a Aspirin Instructions: Last Dose:   Activity level:   Can go up a flight of stairs and perform activities of daily living without stopping and without symptoms of chest pain or shortness of breath.                   Anesthesia review:    Patient denies shortness of breath, fever, cough and chest pain at PAT appointment   Patient verbalized understanding of instructions that were given to them at the PAT appointment. Patient was also instructed that they will need to review over the PAT instructions again at home before surgery

## 2022-09-09 ENCOUNTER — Ambulatory Visit (INDEPENDENT_AMBULATORY_CARE_PROVIDER_SITE_OTHER): Payer: Commercial Managed Care - HMO | Admitting: Gastroenterology

## 2022-09-09 ENCOUNTER — Encounter: Payer: Self-pay | Admitting: Gastroenterology

## 2022-09-09 ENCOUNTER — Other Ambulatory Visit (INDEPENDENT_AMBULATORY_CARE_PROVIDER_SITE_OTHER): Payer: Commercial Managed Care - HMO

## 2022-09-09 VITALS — BP 102/70 | HR 82 | Ht 62.0 in | Wt 117.0 lb

## 2022-09-09 DIAGNOSIS — Z1509 Genetic susceptibility to other malignant neoplasm: Secondary | ICD-10-CM | POA: Diagnosis not present

## 2022-09-09 DIAGNOSIS — C50512 Malignant neoplasm of lower-outer quadrant of left female breast: Secondary | ICD-10-CM

## 2022-09-09 DIAGNOSIS — Z800 Family history of malignant neoplasm of digestive organs: Secondary | ICD-10-CM

## 2022-09-09 DIAGNOSIS — Z1501 Genetic susceptibility to malignant neoplasm of breast: Secondary | ICD-10-CM

## 2022-09-09 DIAGNOSIS — Z86010 Personal history of colonic polyps: Secondary | ICD-10-CM

## 2022-09-09 DIAGNOSIS — Z171 Estrogen receptor negative status [ER-]: Secondary | ICD-10-CM

## 2022-09-09 DIAGNOSIS — Z803 Family history of malignant neoplasm of breast: Secondary | ICD-10-CM

## 2022-09-09 LAB — HEMOGLOBIN A1C: Hgb A1c MFr Bld: 5.5 % (ref 4.6–6.5)

## 2022-09-09 MED ORDER — NA SULFATE-K SULFATE-MG SULF 17.5-3.13-1.6 GM/177ML PO SOLN: 1.0000 | Freq: Once | ORAL | 0 refills | Status: AC

## 2022-09-09 NOTE — Patient Instructions (Addendum)
Your provider has requested that you go to the basement level for lab work before leaving today. Press "B" on the elevator. The lab is located at the first door on the left as you exit the elevator.  You will be contacted by [facility] Scheduling in the next 2 days to arrange a MRI/MRCP.  The number on your caller ID will be [phone], please answer when they call.  If you have not heard from them in 2 days please call [phone] to schedule.    You have been scheduled for a colonoscopy. Please follow written instructions given to you at your visit today.   Please pick up your prep supplies at the pharmacy within the next 1-3 days.  If you use inhalers (even only as needed), please bring them with you on the day of your procedure.  DO NOT TAKE 7 DAYS PRIOR TO TEST- Trulicity (dulaglutide) Ozempic, Wegovy (semaglutide) Mounjaro (tirzepatide) Bydureon Bcise (exanatide extended release)  DO NOT TAKE 1 DAY PRIOR TO YOUR TEST Rybelsus (semaglutide) Adlyxin (lixisenatide) Victoza (liraglutide) Byetta (exanatide) ___________________________________________________________________________  _______________________________________________________  If your blood pressure at your visit was 140/90 or greater, please contact your primary care physician to follow up on this.  _______________________________________________________ If you are age 54 or younger, your body mass index should be between 19-25. Your Body mass index is 21.4 kg/m. If this is out of the aformentioned range listed, please consider follow up with your Primary Care Provider.   __________________________________________________________  The [place] [facility] providers would like to encourage you to use [facility] to communicate with providers for non-urgent requests or questions.  Due to long hold times on the telephone, sending your provider a message by [facility] may be a faster and more efficient way to get a response.   Please allow 48 business hours for a response.  Please remember that this is for non-urgent requests.   Due to recent changes in healthcare laws, you may see the results of your imaging and laboratory studies on MyChart before your provider has had a chance to review them.  We understand that in some cases there may be results that are confusing or concerning to you. Not all laboratory results come back in the same time frame and the provider may be waiting for multiple results in order to interpret others.  Please give [place] 48 hours in order for your provider to thoroughly review all the results before contacting the office for clarification of your results.     Thank you for choosing me and [facility].  Vito Cirigliano, D.O.

## 2022-09-09 NOTE — Progress Notes (Signed)
Chief Complaint: Pancreatic Cancer screening   Referring Provider:     Serena Croissant, MD    HPI:     Julie Townsend is a 57 y.o. female referred to the Gastroenterology Clinic for evaluation of Pancreatic Cancer screening.  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  Underwent bilateral mastectomies in 10/2021, then chemotherapy 12/2021-06/2022, followed by reconstructive breast surgery in 07/2022.  She is scheduled for robotic assisted bilateral salpingo-oophorectomy with Dr. Pricilla Holm next week.  Has follow-up breast surgery scheduled for September.  She had follow-up in the Oncology Clinic on 08/16/2022.  Was referred here to discuss Pancreatic Cancer screening protocol.  Also has referral in place to Dermatology for annual skin checks.  She is otherwise without any GI symptoms.  No nausea, vomiting, abdominal pain, change in bowel habits.  No history of jaundice, icteric sclera.  -09/26/2021: Genetic testing: Pathogenic variant in BRCA2 at p.N8295* (c.3922G>T).   Fhx: Maternal uncle with Pancreatic CA, deceased age 61.  Maternal uncle with Colon and Prostate CA.  Mother with Breast CA Maternal GM with ovarian CA  Had a colonoscopy in [place] about 6 years ago and notable for polyps with recommendation to repeat in 5 years, which was delayed due to her cancer diagnosis and treatment as above.     Latest Ref Rng & Units 09/07/2022   11:33 AM 07/16/2022    2:00 PM 06/03/2022   11:46 AM  CBC  WBC 4.0 - 10.5 K/uL 4.6  8.6  10.4   Hemoglobin 12.0 - 15.0 g/dL 62.1  30.8  65.7   Hematocrit 36.0 - 46.0 % 38.7  38.8  34.2   Platelets 150 - 400 K/uL 412  289  287       Latest Ref Rng & Units 09/07/2022   11:33 AM 07/16/2022    2:00 PM 06/03/2022   11:46 AM  CMP  Glucose 70 - 99 mg/dL 99  91  93   BUN 6 - 20 mg/dL 11  16  17    Creatinine 0.44 - 1.00 mg/dL 8.46  9.62  9.52   Sodium 135 - 145  mmol/L 137  138  140   Potassium 3.5 - 5.1 mmol/L 4.4  3.4  4.0   Chloride 98 - 111 mmol/L 103  102  105   CO2 22 - 32 mmol/L 24  26  29    Calcium 8.9 - 10.3 mg/dL 9.2  9.6  9.3   Total Protein 6.5 - 8.1 g/dL 7.4   6.8   Total Bilirubin 0.3 - 1.2 mg/dL 0.3   0.2   Alkaline Phos 38 - 126 U/L 73   71   AST 15 - 41 U/L 17   15   ALT 0 - 44 U/L 10   11      Past Medical History:  Diagnosis Date   BRCA2 gene mutation positive 09/26/2021   Breast cancer (HCC) 09/2021   left breast IDC   Family history of breast cancer 09/22/2021   Family history of colon cancer    Family history of ovarian cancer 09/22/2021   Family history of pancreatic cancer 09/22/2021   Family history of prostate cancer    Genetic testing 09/26/2021   Pathogenic variant in BRCA2 at p.W4132* (c.3922G>T).  Report date is September 26, 2021.    The BRCAplus panel offered by [facility] and includes sequencing and deletion/duplication analysis for the following 8 genes: ATM, BRCA1, BRCA2, CDH1, CHEK2, PALB2, PTEN, and TP53.  Results of pan-cancer panel are pending.     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    correct Hands   Port-A-Cath in place 03/10/2022     Past Surgical History:  Procedure Laterality Date   BREAST IMPLANT REMOVAL Right 12/17/2021   Procedure: Right breast expander removal with washout;  Surgeon: Peggye Form, DO;  Location: [facility];  Service: Plastics;  Laterality: Right;   BREAST RECONSTRUCTION WITH PLACEMENT OF TISSUE EXPANDER AND FLEX HD (ACELLULAR HYDRATED DERMIS) Bilateral 10/29/2021   Procedure: BREAST RECONSTRUCTION WITH PLACEMENT OF TISSUE EXPANDER AND FLEX HD (ACELLULAR HYDRATED DERMIS);  Surgeon: Peggye Form, DO;  Location: [facility];  Service: Plastics;  Laterality: Bilateral;   BREAST RECONSTRUCTION WITH PLACEMENT OF TISSUE EXPANDER AND FLEX HD (ACELLULAR HYDRATED DERMIS) Right 07/26/2022   Procedure:  PLACEMENT OF TISSUE EXPANDER;  Surgeon: Peggye Form, DO;  Location: [facility];  Service: Plastics;  Laterality: Right;   CARPAL TUNNEL RELEASE Right 06/20/2020   Procedure: CARPAL TUNNEL RELEASE;  Surgeon: Betha Loa, MD;  Location: [facility];  Service: Orthopedics;  Laterality: Right;   DILATION AND CURETTAGE OF UTERUS     IRRIGATION AND DEBRIDEMENT OF WOUND WITH SPLIT THICKNESS SKIN GRAFT Right 03/15/2022   Procedure: Debridement of right breast;  Surgeon: Peggye Form, DO;  Location: [facility];  Service: Plastics;  Laterality: Right;   LATISSIMUS FLAP TO BREAST Right 07/26/2022   Procedure: Right breast reconstruction and latissimus muscle flap;  Surgeon: Peggye Form, DO;  Location: [facility];  Service: Plastics;  Laterality: Right;   MASTECTOMY     MASTECTOMY W/ SENTINEL NODE BIOPSY Left 10/29/2021   Procedure: LEFT MASTECTOMY WITH LEFT SENTINEL LYMPH NODE BIOPSY;  Surgeon: Abigail Miyamoto, MD;  Location: [facility];  Service: General;  Laterality: Left;   PORT-A-CATH REMOVAL N/A 07/26/2022   Procedure: PORT-A-CATH REMOVAL;  Surgeon: Abigail Miyamoto, MD;  Location: [facility];  Service: General;  Laterality: N/A;   PORTACATH PLACEMENT Right 10/29/2021   Procedure: INSERTION PORT-A-CATH;  Surgeon: Abigail Miyamoto, MD;  Location: [facility];  Service: General;  Laterality: Right;   TOTAL MASTECTOMY Right 10/29/2021   Procedure: RIGHT TOTAL MASTECTOMY;  Surgeon: Abigail Miyamoto, MD;  Location: [facility];  Service: General;  Laterality: Right;   TRIGGER FINGER RELEASE Left 05/08/2020   Procedure: LEFT LONG FINGER TRIGGER RELEASE AND LEFT RING FINGER TRIGGER RELEASE;  Surgeon: Betha Loa, MD;  Location: [facility];  Service: Orthopedics;  Laterality: Left;   trigger finger release rt hand     Family History  Problem Relation Age of Onset   Migraines Mother    Breast cancer Mother 50       recurr at   62   Migraines Maternal Aunt    Colon cancer Maternal Uncle        dx late 60s   Pancreatic cancer Maternal Uncle 28   Migraines Maternal Grandmother    Ovarian cancer Maternal Grandmother        dx 63s   Social History   Tobacco Use   Smoking status: Never    Passive exposure: Never   Smokeless tobacco: Never  Vaping Use   Vaping status: Never Used  Substance Use Topics   Alcohol use: Never   Drug  use: Never   Current Outpatient Medications  Medication Sig Dispense Refill   estradiol (ESTRACE) 0.1 MG/GM vaginal cream Place 4 g vaginally 2 (two) times a week.     Galcanezumab-gnlm (EMGALITY) 120 MG/ML SOAJ Inject 120 mg into the skin every 28 (twenty-eight) days. 1 mL 3   ibuprofen (ADVIL) 200 MG tablet Take 600 mg by mouth every 6 (six) hours as needed for moderate pain or headache.     sulfamethoxazole-trimethoprim (BACTRIM DS) 800-160 MG tablet Take 1 tablet by mouth 2 (two) times daily for 14 days. 28 tablet 0   EPINEPHrine 0.3 mg/0.3 mL IJ SOAJ injection Inject 0.3 mg into the muscle as needed for anaphylaxis. (Patient not taking: Reported on 09/09/2022) 1 each 1   oxyCODONE (OXY IR/ROXICODONE) 5 MG immediate release tablet Take 1 tablet (5 mg total) by mouth every 4 (four) hours as needed for severe pain. For AFTER surgery only, do not take and drive (Patient not taking: Reported on 09/09/2022) 15 tablet 0   senna-docusate (SENOKOT-S) 8.6-50 MG tablet Take 2 tablets by mouth at bedtime. For AFTER surgery, do not take if having diarrhea (Patient not taking: Reported on 09/09/2022) 30 tablet 0   No current facility-administered medications for this visit.   Allergies  Allergen Reactions   Bee Venom Anaphylaxis    Yellow jackets only     Review of Systems: All systems reviewed and negative except where noted in HPI.     Physical Exam:    Wt Readings from Last 3 Encounters:  09/09/22 117 lb (53.1 kg)  09/07/22 114 lb (51.7 kg)  09/03/22 115 lb 12.8 oz (52.5 kg)    BP  102/70   Pulse 82   Ht 5\' 2"  (1.575 m)   Wt 117 lb (53.1 kg)   BMI 21.40 kg/m  Constitutional:  Pleasant, in no acute distress. Psychiatric: Normal mood and affect. Behavior is normal. Neurological: Alert and oriented to person place and time. Skin: Skin is warm and dry. No rashes noted.   ASSESSMENT AND PLAN;   1) BRCA2 gene mutation 2) Family history of Pancreatic CA From most recent International CAPS Consortium, ASGE, and AGA guidelines, screening for pancreatic cancer recommended for select high risk individuals.  This patient meets the high risk screening cohort based on the following:  -Per most recent ASGE guidelines, patients with personal history of pathogenic BRCA1 or BRCA2 mutation should undergo Pancreatic Cancer screening regardless of family history  Recommended age to start surveillance varies by gene mutation status and family history, as follows: -BRCA2, ATM, PALB 2, BRCA1, MLH1/MSH2-start at age 44 or 89, or 10 years younger than the youngest affected blood relative  Screening techniques include the following: -Baseline: MRI/MRCP plus EUS plus fasting blood glucose and/or hemoglobin A1c  -Follow-up/surveillance phase: Alternate MRI/MRCP and EUS plus routine fasting blood glucose and/or hemoglobin A1c -If concerning features on imaging, check CA 19-9 -EUS with FNA for solid lesions ?5 mm, cystic lesions with worrisome features, or asymptomatic main pancreatic duct (MPD) strictures (with or without mass) -CT only for solid lesions, regardless of size, or asymptomatic MPD strictures of unknown etiology (without mass)  -Screening interval every 12 months in patients with no abnormalities/nonconcerning abnormalities -Screening interval every 3-6 months in patients with abnormalities that are NOT suspicious for malignancy, but are concerning -EUS evaluation should be performed within 3-6 months for indeterminate lesions (abnormalities that are NOT suspicious for  malignancy, but are concerning) -EUS evaluation should be performed within 3 months for high-risk  lesions, if surgical resection is not planned. -Immediate surgical referral for abnormality suspicious for malignancy  -New-onset diabetes in a high-risk individual should lead to additional diagnostic studies or change in surveillance interval  -Genetic testing and counseling should be considered for familial pancreas cancer relatives who are eligible for surveillance. A positive germline mutation is associated with an increased risk of neoplastic progression and may also lead to screening for other relevant associated cancers -Participation in a registry or referral to a pancreas Center of Excellence should be pursued when possible for high-risk patients undergoing pancreas cancer screening -The target detectable pancreatic neoplasms are resectable stage I pancreatic ductal adenocarcinoma and high-risk precursor neoplasms, such as intraductal papillary mucinous neoplasms with high-grade dysplasia and some enlarged pancreatic intraepithelial neoplasias  -We discussed the limitations and potential risks of pancreas cancer screening prior to initiating any screening program, and the patient  wishes to proceed with screening. Plan for the following:  - MRI/MRCP now  - Hgb A1c now - EUS in 6 months - RTC every 6 months - If index MRI/MRCP and EUS are unrevealing/normal, per protocol, will liberalize to annual screening, alternating between MRI/MRCP and EUS  3) History of colon polyps - Will tentatively plan for colonoscopy for ongoing polyp surveillance in late October to give her time to recover from her upcoming surgeries. - Discussed the risks, benefits, alternatives of optical colonoscopy and she wishes to proceed.  4) Breast cancer - Continue follow-up in the Oncology, GYN Oncology, and Plastic Surgery clinics as planned   I spent 60 minutes of time, including in depth chart review in  preparation for this appointment, independent review of results as outlined above, communicating results with the patient directly, face-to-face time with the patient, coordinating care, ordering studies and medications as appropriate, and documentation.      Shellia Cleverly, DO, FACG  09/09/2022, 9:28 AM   Serena Croissant, MD

## 2022-09-10 ENCOUNTER — Telehealth: Payer: Self-pay

## 2022-09-10 ENCOUNTER — Telehealth: Payer: Self-pay | Admitting: Gastroenterology

## 2022-09-10 NOTE — Telephone Encounter (Signed)
Pt left voicemail inquiring if she can have an MRI on Sunday with the type of tissue expanders she has.

## 2022-09-10 NOTE — Telephone Encounter (Signed)
Julie Townsend already responded to patient. MRI cannot be obtained in patient's with breast tissue expanders given that it contains a metal ring port and can be displaced/migrate during the imaging.

## 2022-09-10 NOTE — Telephone Encounter (Signed)
Inbound call from patient per plastic surgeon, due to breast reconstruction w/placement of expanders, patient can not have MRI. Please advise.   Thank you

## 2022-09-10 NOTE — Telephone Encounter (Signed)
Dr. Barron Alvine, please see note below and advise. Thanks

## 2022-09-12 ENCOUNTER — Ambulatory Visit (HOSPITAL_COMMUNITY): Payer: Commercial Managed Care - HMO

## 2022-09-12 NOTE — Progress Notes (Unsigned)
NEUROLOGY FOLLOW UP OFFICE NOTE  SHAWNE KEMERLING [id number]  Assessment/Plan:   Migraine without aura, without status migrainosus, not intractable Chronic daily headache   Migraine prevention:  Emgality every 28 days.  To address daily headaches, start nortriptyline 10mg  at bedtime.  We can increase to 25mg  at bedtime in 4 weeks if needed. Migraine rescue:  Will have her try Zavzpret NS for acute treatment of migraines; sumatriptan 50mg  second line Limit use of pain relievers to no more than 2 days out of week to prevent risk of rebound or medication-overuse headache. Keep headache diary Follow up 4-5 months.       Subjective:  NARMEEN TENBRINK is a 57 year old female who follows up for migraine.   UPDATE: Last seen in October.  At that time, started nortriptyline to address daily nagging headache.  *** Zavzpret *** Intensity:  5-6/10 Duration:  *** with Bernita Raisin Frequency:  3-4 days (2 before the Emgality injection and 2 days after injection)   Rescue protocol:  First line Ubrelvy, sumatriptan second line Frequency of abortive medication: 4 days a month Current NSAIDS/analgesics:  ibuprofen Current triptans:  sumatriptan 50mg  Current ergotamine:  none Current anti-emetic:  none Current muscle relaxants:  none Current Antihypertensive medications:  none Current Antidepressant medications:  nortriptyline 10mg  at bedtime Current Anticonvulsant medications:  none Current anti-CGRP:  Emgality, Ubrelvy 100mg  Current Vitamins/Herbal/Supplements:  Mg Current Antihistamines/Decongestants:  none Other therapy:  ice packs, dark room, rest Hormone/birth control:  estradiol   Caffeine:  1 can Coke daily Alcohol:  No Smoker:  No Diet:  Herbal tea all day.  Eats a lot of fruits and vegetables.  Little protein.  No chocolate or cheese Exercise:  routine.  Home gym Depression:  yes but mild; Anxiety:  no Other pain:  osteoarthritis in hands Sleep hygiene:  Poor as  she is going through menopause   HISTORY:  She has a dull headache daily. As for migraines: Onset:  57 years old Location:  over left eye or across forehead, maxillary pressure Quality:  stabbing Initial Intensity:  5-7/10 (rarely more severe).  She denies new headache, thunderclap headache or severe headache that wakes her from sleep. Aura:  absent Prodrome:  absent Associated symptoms:  Nausea, photophobia, phonophobia, irritable.  She denies associated vomiting, autonomic symptoms, visual disturbance, unilateral numbness or weakness. Initial Duration:  24-48 hours (if takes sumatriptan, will take a nap for 45 min to 3 hours and wakes up resolved)  Typically wakes up with it. Initial Frequency:  once a week from 1 to 2 days (3 days before the next Emgality shot she has a severe migraine - she had a lot more migraine days prior to [employer]   Frequency of abortive medication: 1  day a week  Triggers:  change in barometric pressure Relieving factors:  rest, ice pack, heat, sleep Activity:  movement aggravates       Past NSAIDS/analgesics:  Sprix NS, Fioricet, Excedrin Migraine Past abortive triptans:  Axert, Maxalt, Relpax, Zomig Past abortive ergotamine:  none Past muscle relaxants:  none Past anti-emetic:  none Past antihypertensive medications:  verapamil Past antidepressant medications:  amitriptyline Past anticonvulsant medications:  Topiramate, Depakote Past anti-CGRP:  Aimovig, Nurtec (rescue) Past vitamins/Herbal/Supplements:  Mg, B2 Past antihistamines/decongestants:  none Other past therapies:  none     Family history of migraines:  daughter, mother, maternal grandmother, maternal great-grandmother, sister  PAST MEDICAL HISTORY: Past Medical History:  Diagnosis Date   BRCA2 gene mutation positive 09/26/2021  Breast cancer (HCC) 09/2021   left breast IDC   Family history of breast cancer 09/22/2021   Family history of colon cancer    Family history of ovarian  cancer 09/22/2021   Family history of pancreatic cancer 09/22/2021   Family history of prostate cancer    Genetic testing 09/26/2021   Pathogenic variant in BRCA2 at p.Z3086* (c.3922G>T).  Report date is September 26, 2021.    The BRCAplus panel offered by [employer] and includes sequencing and deletion/duplication analysis for the following 8 genes: ATM, BRCA1, BRCA2, CDH1, CHEK2, PALB2, PTEN, and TP53.  Results of pan-cancer panel are pending.     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 (Patient not taking: Reported on 09/09/2022) 1 each 1   estradiol (ESTRACE) 0.1 MG/GM vaginal cream Place 4 g vaginally 2 (two) times a week.     Galcanezumab-gnlm (EMGALITY) 120 MG/ML SOAJ Inject 120 mg into the skin every 28 (twenty-eight) days. 1 mL 3   ibuprofen (ADVIL) 200 MG tablet Take 600 mg by mouth every 6 (six) hours as needed for moderate pain or headache.     oxyCODONE (OXY IR/ROXICODONE) 5 MG immediate release tablet Take 1 tablet (5 mg total) by mouth every 4 (four) hours as needed for severe pain. For AFTER surgery only, do not take and drive (Patient not taking: Reported on 09/09/2022) 15 tablet 0   senna-docusate (SENOKOT-S) 8.6-50 MG tablet Take 2 tablets by mouth at bedtime. For AFTER surgery, do not take if having diarrhea (Patient not taking: Reported on 09/09/2022) 30 tablet 0   sulfamethoxazole-trimethoprim (BACTRIM DS) 800-160 MG tablet Take 1 tablet by mouth 2 (two) times daily for 14 days. 28 tablet 0   No current facility-administered medications on file prior to visit.    ALLERGIES: Allergies  Allergen Reactions   Bee Venom Anaphylaxis    Yellow jackets only    FAMILY HISTORY: Family History   Problem Relation Age of Onset   Migraines Mother    Breast cancer Mother 107       recurr at  36   Migraines Maternal Aunt    Colon cancer Maternal Uncle        dx late 60s   Pancreatic cancer Maternal Uncle 28   Migraines Maternal Grandmother    Ovarian cancer Maternal Grandmother        dx 54s      Objective:  *** General: No acute distress.  Patient appears well-groomed.   Head:  Normocephalic/atraumatic Eyes:  Fundi examined but not visualized Neck: supple, no paraspinal tenderness, full range of motion Heart:  Regular rate and rhythm Neurological Exam: alert and oriented.  Speech fluent and not dysarthric, language intact.  CN II-XII intact. Bulk and tone normal, muscle strength 5/5 throughout.  Sensation to light touch intact.  Deep tendon reflexes 2+ throughout.  Finger to nose testing intact.  Gait normal, Romberg negative.   Shon Millet, DO

## 2022-09-13 ENCOUNTER — Ambulatory Visit (INDEPENDENT_AMBULATORY_CARE_PROVIDER_SITE_OTHER): Payer: Commercial Managed Care - HMO | Admitting: Neurology

## 2022-09-13 ENCOUNTER — Encounter: Payer: Self-pay | Admitting: Neurology

## 2022-09-13 VITALS — BP 123/85 | HR 89 | Ht 62.0 in | Wt 116.0 lb

## 2022-09-13 DIAGNOSIS — G43009 Migraine without aura, not intractable, without status migrainosus: Secondary | ICD-10-CM | POA: Diagnosis not present

## 2022-09-13 MED ORDER — EMGALITY 120 MG/ML ~~LOC~~ SOAJ: 120.0000 mg | SUBCUTANEOUS | 11 refills | Status: DC

## 2022-09-13 MED ORDER — UBRELVY 100 MG PO TABS: ORAL_TABLET | ORAL | 11 refills | Status: DC

## 2022-09-13 NOTE — Telephone Encounter (Signed)
Patient is scheduled for Breast reconstruction in September. Dr. Barron Alvine wants PC screening MRI/MRCP after the surgery. Reminder sent to [place].

## 2022-09-13 NOTE — Patient Instructions (Addendum)
Help finding a primary care provider within . If you do not have access to a computer or the Internet you can call [phone] and  they will help you scheduled with a provider.  Or you can go to: [email or web address]     Continue Emgality and Bernita Raisin (refilled today)

## 2022-09-14 ENCOUNTER — Telehealth: Payer: Self-pay | Admitting: *Deleted

## 2022-09-14 NOTE — Anesthesia Preprocedure Evaluation (Signed)
Anesthesia Evaluation  Patient identified by MRN, date of birth, ID band Patient awake    Reviewed: Allergy & Precautions, H&P , NPO status , Patient's Chart, lab work & pertinent test results  Airway Mallampati: II  TM Distance: >3 FB Neck ROM: Full    Dental no notable dental hx. (+) Dental Advisory Given   Pulmonary neg pulmonary ROS   Pulmonary exam normal breath sounds clear to auscultation       Cardiovascular negative cardio ROS Normal cardiovascular exam Rhythm:Regular Rate:Normal  Echo 8/23  1. Left ventricular ejection fraction, by estimation, is 60 to 65%. The left ventricle has normal function. The left ventricle has no regional wall motion abnormalities. Left ventricular diastolic parameters were normal. The average left ventricular global longitudinal strain is -20.5 %. The global longitudinal strain is normal.  2. Right ventricular systolic function is normal. The right ventricular size is normal. There is normal pulmonary artery systolic pressure.  3. The mitral valve is normal in structure. No evidence of mitral valve regurgitation. No evidence of mitral stenosis.  4. The aortic valve is normal in structure. Aortic valve regurgitation is not visualized. No aortic stenosis is present.  5. The inferior vena cava is normal in size with greater than 50% respiratory variability, suggesting right atrial pressure of 3 mmHg.    Neuro/Psych  Headaches negative neurological ROS  negative psych ROS   GI/Hepatic negative GI ROS, Neg liver ROS,,,  Endo/Other  negative endocrine ROS    Renal/GU negative Renal ROS  negative genitourinary   Musculoskeletal  (+) Arthritis , Osteoarthritis,    Abdominal   Peds negative pediatric ROS (+)  Hematology negative hematology ROS (+)   Anesthesia Other Findings   Reproductive/Obstetrics negative OB ROS                             Anesthesia  Physical Anesthesia Plan  ASA: 2  Anesthesia Plan: General   Post-op Pain Management: Minimal or no pain anticipated, Tylenol PO (pre-op)* and Precedex   Induction: Intravenous  PONV Risk Score and Plan: 3 and Dexamethasone, Treatment may vary due to age or medical condition and Midazolam  Airway Management Planned: Oral ETT  Additional Equipment: None  Intra-op Plan:   Post-operative Plan: Extubation in OR  Informed Consent: I have reviewed the patients History and Physical, chart, labs and discussed the procedure including the risks, benefits and alternatives for the proposed anesthesia with the patient or authorized representative who has indicated his/her understanding and acceptance.     Dental advisory given  Plan Discussed with: CRNA  Anesthesia Plan Comments: (  )       Anesthesia Quick Evaluation

## 2022-09-14 NOTE — Telephone Encounter (Signed)
Telephone call to check on pre-operative status.  Patient compliant with pre-operative instructions.  Reinforced nothing to eat after midnight. Clear liquids until 0515. Patient to arrive at 0615.  No questions or concerns voiced.  Instructed to call for any needs.  ?

## 2022-09-15 ENCOUNTER — Ambulatory Visit (HOSPITAL_BASED_OUTPATIENT_CLINIC_OR_DEPARTMENT_OTHER): Payer: Commercial Managed Care - HMO | Admitting: Certified Registered"

## 2022-09-15 ENCOUNTER — Ambulatory Visit (HOSPITAL_COMMUNITY): Payer: Commercial Managed Care - HMO | Admitting: Certified Registered"

## 2022-09-15 ENCOUNTER — Other Ambulatory Visit: Payer: Self-pay

## 2022-09-15 ENCOUNTER — Encounter (HOSPITAL_COMMUNITY)
Admission: RE | Disposition: A | Discharge status: Home / Self Care | Payer: Self-pay | Source: Home / Self Care | Attending: Gynecologic Oncology

## 2022-09-15 ENCOUNTER — Ambulatory Visit (HOSPITAL_COMMUNITY)
Admission: RE | Admit: 2022-09-15 | Discharge: 2022-09-15 | Disposition: A | Discharge status: Home / Self Care | Payer: Commercial Managed Care - HMO | Attending: Gynecologic Oncology | Admitting: Gynecologic Oncology

## 2022-09-15 ENCOUNTER — Encounter (HOSPITAL_COMMUNITY): Payer: Self-pay | Admitting: Gynecologic Oncology

## 2022-09-15 DIAGNOSIS — Z1501 Genetic susceptibility to malignant neoplasm of breast: Secondary | ICD-10-CM

## 2022-09-15 DIAGNOSIS — Z853 Personal history of malignant neoplasm of breast: Secondary | ICD-10-CM | POA: Diagnosis not present

## 2022-09-15 DIAGNOSIS — Z1502 Genetic susceptibility to malignant neoplasm of ovary: Secondary | ICD-10-CM

## 2022-09-15 DIAGNOSIS — Z4002 Encounter for prophylactic removal of ovary: Secondary | ICD-10-CM

## 2022-09-15 DIAGNOSIS — G43909 Migraine, unspecified, not intractable, without status migrainosus: Secondary | ICD-10-CM | POA: Diagnosis not present

## 2022-09-15 DIAGNOSIS — Z171 Estrogen receptor negative status [ER-]: Secondary | ICD-10-CM

## 2022-09-15 DIAGNOSIS — M1990 Unspecified osteoarthritis, unspecified site: Secondary | ICD-10-CM | POA: Diagnosis not present

## 2022-09-15 DIAGNOSIS — Z148 Genetic carrier of other disease: Secondary | ICD-10-CM

## 2022-09-15 DIAGNOSIS — C50512 Malignant neoplasm of lower-outer quadrant of left female breast: Secondary | ICD-10-CM

## 2022-09-15 DIAGNOSIS — Z1509 Genetic susceptibility to other malignant neoplasm: Secondary | ICD-10-CM

## 2022-09-15 DIAGNOSIS — N83292 Other ovarian cyst, left side: Secondary | ICD-10-CM | POA: Insufficient documentation

## 2022-09-15 HISTORY — PX: ROBOTIC ASSISTED SALPINGO OOPHERECTOMY: SHX6082

## 2022-09-15 SURGERY — SALPINGO-OOPHORECTOMY, ROBOT-ASSISTED
Anesthesia: General | Laterality: Bilateral

## 2022-09-15 MED ORDER — HYDROMORPHONE HCL 1 MG/ML IJ SOLN
0.2500 mg | INTRAMUSCULAR | Status: DC | PRN
  Administered 2022-09-15: 0.25 mg via INTRAVENOUS
  Administered 2022-09-15 (×2): 0.5 mg via INTRAVENOUS

## 2022-09-15 MED ORDER — CHLORHEXIDINE GLUCONATE 0.12 % MT SOLN
15.0000 mL | Freq: Once | OROMUCOSAL | Status: AC
  Administered 2022-09-15: 15 mL via OROMUCOSAL

## 2022-09-15 MED ORDER — ROCURONIUM BROMIDE 10 MG/ML (PF) SYRINGE
PREFILLED_SYRINGE | INTRAVENOUS | Status: AC
  Filled 2022-09-15: qty 10

## 2022-09-15 MED ORDER — STERILE WATER FOR IRRIGATION IR SOLN
Status: DC | PRN
  Administered 2022-09-15: 1000 mL

## 2022-09-15 MED ORDER — LACTATED RINGERS IR SOLN
Status: DC | PRN
  Administered 2022-09-15: 1

## 2022-09-15 MED ORDER — FENTANYL CITRATE (PF) 100 MCG/2ML IJ SOLN
INTRAMUSCULAR | Status: AC
  Filled 2022-09-15: qty 2

## 2022-09-15 MED ORDER — ONDANSETRON HCL 4 MG/2ML IJ SOLN: 4.0000 mg | Freq: Once | INTRAMUSCULAR | Status: DC | PRN

## 2022-09-15 MED ORDER — HYDROMORPHONE HCL 1 MG/ML IJ SOLN
INTRAMUSCULAR | Status: AC
  Administered 2022-09-15: 0.5 mg via INTRAVENOUS
  Filled 2022-09-15: qty 1

## 2022-09-15 MED ORDER — OXYCODONE HCL 5 MG/5ML PO SOLN: 5.0000 mg | Freq: Once | ORAL | Status: AC | PRN

## 2022-09-15 MED ORDER — KETOROLAC TROMETHAMINE 30 MG/ML IJ SOLN
INTRAMUSCULAR | Status: AC
  Administered 2022-09-15: 30 mg via INTRAVENOUS
  Filled 2022-09-15: qty 1

## 2022-09-15 MED ORDER — MIDAZOLAM HCL 5 MG/5ML IJ SOLN
INTRAMUSCULAR | Status: DC | PRN
  Administered 2022-09-15: 2 mg via INTRAVENOUS

## 2022-09-15 MED ORDER — ACETAMINOPHEN 500 MG PO TABS: 1000.0000 mg | ORAL_TABLET | Freq: Once | ORAL | Status: DC

## 2022-09-15 MED ORDER — ROCURONIUM BROMIDE 10 MG/ML (PF) SYRINGE
PREFILLED_SYRINGE | INTRAVENOUS | Status: DC | PRN
  Administered 2022-09-15: 40 mg via INTRAVENOUS

## 2022-09-15 MED ORDER — PHENYLEPHRINE HCL-NACL 20-0.9 MG/250ML-% IV SOLN
INTRAVENOUS | Status: DC | PRN
  Administered 2022-09-15: 25 ug/min via INTRAVENOUS

## 2022-09-15 MED ORDER — DEXAMETHASONE SODIUM PHOSPHATE 10 MG/ML IJ SOLN
INTRAMUSCULAR | Status: DC | PRN
  Administered 2022-09-15: 8 mg via INTRAVENOUS

## 2022-09-15 MED ORDER — ONDANSETRON HCL 4 MG/2ML IJ SOLN
INTRAMUSCULAR | Status: AC
  Filled 2022-09-15: qty 2

## 2022-09-15 MED ORDER — LIDOCAINE 2% (20 MG/ML) 5 ML SYRINGE
INTRAMUSCULAR | Status: DC | PRN
  Administered 2022-09-15: 100 mg via INTRAVENOUS

## 2022-09-15 MED ORDER — FENTANYL CITRATE (PF) 100 MCG/2ML IJ SOLN
INTRAMUSCULAR | Status: DC | PRN
  Administered 2022-09-15 (×2): 50 ug via INTRAVENOUS

## 2022-09-15 MED ORDER — LACTATED RINGERS IV SOLN: INTRAVENOUS | Status: DC

## 2022-09-15 MED ORDER — OXYCODONE HCL 5 MG PO TABS: 5.0000 mg | ORAL_TABLET | Freq: Once | ORAL | Status: AC | PRN

## 2022-09-15 MED ORDER — DEXMEDETOMIDINE HCL IN NACL 200 MCG/50ML IV SOLN
INTRAVENOUS | Status: DC | PRN
  Administered 2022-09-15: 8 ug via INTRAVENOUS

## 2022-09-15 MED ORDER — HEPARIN SODIUM (PORCINE) 5000 UNIT/ML IJ SOLN
5000.0000 [IU] | INTRAMUSCULAR | Status: AC
  Administered 2022-09-15: 5000 [IU] via SUBCUTANEOUS
  Filled 2022-09-15: qty 1

## 2022-09-15 MED ORDER — SUGAMMADEX SODIUM 200 MG/2ML IV SOLN
INTRAVENOUS | Status: DC | PRN
  Administered 2022-09-15: 150 mg via INTRAVENOUS

## 2022-09-15 MED ORDER — LIDOCAINE HCL (PF) 2 % IJ SOLN
INTRAMUSCULAR | Status: AC
  Filled 2022-09-15: qty 5

## 2022-09-15 MED ORDER — DEXAMETHASONE SODIUM PHOSPHATE 10 MG/ML IJ SOLN
INTRAMUSCULAR | Status: AC
  Filled 2022-09-15: qty 1

## 2022-09-15 MED ORDER — MIDAZOLAM HCL 2 MG/2ML IJ SOLN
INTRAMUSCULAR | Status: AC
  Filled 2022-09-15: qty 2

## 2022-09-15 MED ORDER — OXYCODONE HCL 5 MG PO TABS
ORAL_TABLET | ORAL | Status: AC
  Administered 2022-09-15: 5 mg via ORAL
  Filled 2022-09-15: qty 1

## 2022-09-15 MED ORDER — ORAL CARE MOUTH RINSE: 15.0000 mL | Freq: Once | OROMUCOSAL | Status: AC

## 2022-09-15 MED ORDER — PROPOFOL 10 MG/ML IV BOLUS
INTRAVENOUS | Status: AC
  Filled 2022-09-15: qty 20

## 2022-09-15 MED ORDER — PROPOFOL 10 MG/ML IV BOLUS
INTRAVENOUS | Status: DC | PRN
  Administered 2022-09-15: 100 mg via INTRAVENOUS

## 2022-09-15 MED ORDER — BUPIVACAINE HCL 0.25 % IJ SOLN
INTRAMUSCULAR | Status: DC | PRN
  Administered 2022-09-15: 28 mL

## 2022-09-15 MED ORDER — HYDROMORPHONE HCL 1 MG/ML IJ SOLN
INTRAMUSCULAR | Status: AC
  Administered 2022-09-15: 0.25 mg via INTRAVENOUS
  Filled 2022-09-15: qty 1

## 2022-09-15 MED ORDER — LACTATED RINGERS IV SOLN: INTRAVENOUS | Status: DC | PRN

## 2022-09-15 MED ORDER — KETOROLAC TROMETHAMINE 30 MG/ML IJ SOLN: 30.0000 mg | Freq: Once | INTRAMUSCULAR | Status: AC | PRN

## 2022-09-15 MED ORDER — SCOPOLAMINE 1 MG/3DAYS TD PT72
1.0000 | MEDICATED_PATCH | TRANSDERMAL | Status: DC
  Administered 2022-09-15: 1.5 mg via TRANSDERMAL
  Filled 2022-09-15: qty 1

## 2022-09-15 MED ORDER — BUPIVACAINE HCL 0.25 % IJ SOLN
INTRAMUSCULAR | Status: AC
  Filled 2022-09-15: qty 1

## 2022-09-15 MED ORDER — ONDANSETRON HCL 4 MG/2ML IJ SOLN
INTRAMUSCULAR | Status: DC | PRN
  Administered 2022-09-15: 4 mg via INTRAVENOUS

## 2022-09-15 MED ORDER — ACETAMINOPHEN 500 MG PO TABS
1000.0000 mg | ORAL_TABLET | ORAL | Status: AC
  Administered 2022-09-15: 1000 mg via ORAL
  Filled 2022-09-15: qty 2

## 2022-09-15 MED ORDER — DEXAMETHASONE SODIUM PHOSPHATE 4 MG/ML IJ SOLN: 4.0000 mg | INTRAMUSCULAR | Status: DC

## 2022-09-15 SURGICAL SUPPLY — 80 items
ADH SKN CLS APL DERMABOND .7 (GAUZE/BANDAGES/DRESSINGS) ×1
AGENT HMST KT MTR STRL THRMB (HEMOSTASIS)
APL ESCP 34 STRL LF DISP (HEMOSTASIS)
APPLICATOR SURGIFLO ENDO (HEMOSTASIS) IMPLANT
BAG COUNTER SPONGE SURGICOUNT (BAG) IMPLANT
BAG LAPAROSCOPIC 12 15 PORT 16 (BASKET) IMPLANT
BAG RETRIEVAL 12/15 (BASKET)
BAG SPNG CNTER NS LX DISP (BAG)
BLADE SURG SZ10 CARB STEEL (BLADE) IMPLANT
COVER BACK TABLE 60X90IN (DRAPES) ×1 IMPLANT
COVER TIP SHEARS 8 DVNC (MISCELLANEOUS) ×1 IMPLANT
DERMABOND ADVANCED .7 DNX12 (GAUZE/BANDAGES/DRESSINGS) ×1 IMPLANT
DRAPE ARM DVNC X/XI (DISPOSABLE) ×4 IMPLANT
DRAPE COLUMN DVNC XI (DISPOSABLE) ×1 IMPLANT
DRAPE SHEET LG 3/4 BI-LAMINATE (DRAPES) ×1 IMPLANT
DRAPE SURG IRRIG POUCH 19X23 (DRAPES) ×1 IMPLANT
DRIVER NDL MEGA SUTCUT DVNCXI (INSTRUMENTS) ×1 IMPLANT
DRIVER NDLE MEGA SUTCUT DVNCXI (INSTRUMENTS) ×1
DRSG OPSITE POSTOP 4X6 (GAUZE/BANDAGES/DRESSINGS) IMPLANT
DRSG OPSITE POSTOP 4X8 (GAUZE/BANDAGES/DRESSINGS) IMPLANT
ELECT PENCIL ROCKER SW 15FT (MISCELLANEOUS) IMPLANT
ELECT REM PT RETURN 15FT ADLT (MISCELLANEOUS) ×1 IMPLANT
FORCEPS BPLR FENES DVNC XI (FORCEP) ×1 IMPLANT
FORCEPS PROGRASP DVNC XI (FORCEP) ×1 IMPLANT
GAUZE 4X4 16PLY ~~LOC~~+RFID DBL (SPONGE) ×2 IMPLANT
GLOVE BIO SURGEON STRL SZ 6 (GLOVE) ×4 IMPLANT
GLOVE BIO SURGEON STRL SZ 6.5 (GLOVE) ×1 IMPLANT
GOWN STRL REUS W/ TWL LRG LVL3 (GOWN DISPOSABLE) ×4 IMPLANT
GOWN STRL REUS W/TWL LRG LVL3 (GOWN DISPOSABLE) ×4
GRASPER SUT TROCAR 14GX15 (MISCELLANEOUS) IMPLANT
HOLDER FOLEY CATH W/STRAP (MISCELLANEOUS) IMPLANT
IRRIG SUCT STRYKERFLOW 2 WTIP (MISCELLANEOUS) ×1
IRRIGATION SUCT STRKRFLW 2 WTP (MISCELLANEOUS) ×1 IMPLANT
KIT PROCEDURE DVNC SI (MISCELLANEOUS) IMPLANT
KIT TURNOVER KIT A (KITS) IMPLANT
LIGASURE IMPACT 36 18CM CVD LR (INSTRUMENTS) IMPLANT
MANIPULATOR ADVINCU DEL 3.0 PL (MISCELLANEOUS) IMPLANT
MANIPULATOR ADVINCU DEL 3.5 PL (MISCELLANEOUS) IMPLANT
MANIPULATOR UTERINE 4.5 ZUMI (MISCELLANEOUS) IMPLANT
NDL HYPO 21X1.5 SAFETY (NEEDLE) ×1 IMPLANT
NDL SPNL 18GX3.5 QUINCKE PK (NEEDLE) IMPLANT
NEEDLE HYPO 21X1.5 SAFETY (NEEDLE) ×1
NEEDLE SPNL 18GX3.5 QUINCKE PK (NEEDLE)
OBTURATOR OPTICAL STND 8 DVNC (TROCAR) ×1
OBTURATOR OPTICALSTD 8 DVNC (TROCAR) ×1 IMPLANT
PACK ROBOT GYN CUSTOM WL (TRAY / TRAY PROCEDURE) ×1 IMPLANT
PAD POSITIONING PINK XL (MISCELLANEOUS) ×1 IMPLANT
PENCIL ELECTRO RS 15 CORD (MISCELLANEOUS) IMPLANT
PORT ACCESS TROCAR AIRSEAL 12 (TROCAR) IMPLANT
SCISSORS MNPLR CVD DVNC XI (INSTRUMENTS) ×1 IMPLANT
SCRUB CHG 4% DYNA-HEX 4OZ (MISCELLANEOUS) IMPLANT
SEAL UNIV 5-12 XI (MISCELLANEOUS) ×4 IMPLANT
SET TRI-LUMEN FLTR TB AIRSEAL (TUBING) ×1 IMPLANT
SPIKE FLUID TRANSFER (MISCELLANEOUS) ×1 IMPLANT
SPONGE T-LAP 18X18 ~~LOC~~+RFID (SPONGE) IMPLANT
SURGIFLO W/THROMBIN 8M KIT (HEMOSTASIS) IMPLANT
SUT MNCRL AB 4-0 PS2 18 (SUTURE) IMPLANT
SUT PDS AB 1 TP1 96 (SUTURE) IMPLANT
SUT V-LOC 180 0-0 GS22 (SUTURE) IMPLANT
SUT VIC AB 0 CT1 27 (SUTURE)
SUT VIC AB 0 CT1 27XBRD ANTBC (SUTURE) IMPLANT
SUT VIC AB 2-0 CT1 27 (SUTURE)
SUT VIC AB 2-0 CT1 TAPERPNT 27 (SUTURE) IMPLANT
SUT VIC AB 4-0 PS2 18 (SUTURE) ×2 IMPLANT
SUT VICRYL 0 27 CT2 27 ABS (SUTURE) ×1 IMPLANT
SUT VLOC 180 0 9IN GS21 (SUTURE) IMPLANT
SYR 10ML LL (SYRINGE) IMPLANT
SYS BAG RETRIEVAL 10MM (BASKET)
SYS RETRIEVAL 5MM INZII UNIV (BASKET) ×2
SYS WOUND ALEXIS 18CM MED (MISCELLANEOUS)
SYSTEM BAG RETRIEVAL 10MM (BASKET) IMPLANT
SYSTEM RETRIEVL 5MM INZII UNIV (BASKET) IMPLANT
SYSTEM WOUND ALEXIS 18CM MED (MISCELLANEOUS) IMPLANT
TOWEL OR NON WOVEN STRL DISP B (DISPOSABLE) IMPLANT
TRAP SPECIMEN MUCUS 40CC (MISCELLANEOUS) IMPLANT
TRAY FOLEY MTR SLVR 16FR STAT (SET/KITS/TRAYS/PACK) ×1 IMPLANT
TROCAR PORT AIRSEAL 5X120 (TROCAR) IMPLANT
UNDERPAD 30X36 HEAVY ABSORB (UNDERPADS AND DIAPERS) ×2 IMPLANT
WATER STERILE IRR 1000ML POUR (IV SOLUTION) ×1 IMPLANT
YANKAUER SUCT BULB TIP 10FT TU (MISCELLANEOUS) IMPLANT

## 2022-09-15 NOTE — Anesthesia Postprocedure Evaluation (Signed)
Anesthesia Post Note  Patient: Julie Townsend  Procedure(s) Performed: XI ROBOTIC ASSISTED SALPINGO OOPHORECTOMY (Bilateral)     Patient location during evaluation: PACU Anesthesia Type: General Level of consciousness: awake and alert Pain management: pain level controlled Vital Signs Assessment: post-procedure vital signs reviewed and stable Respiratory status: spontaneous breathing, nonlabored ventilation, respiratory function stable and patient connected to nasal cannula oxygen Cardiovascular status: blood pressure returned to baseline and stable Postop Assessment: no apparent nausea or vomiting Anesthetic complications: no   No notable events documented.  Last Vitals:  Vitals:   09/15/22 1045 09/15/22 1111  BP: 114/84 (!) 131/92  Pulse: 77 73  Resp: 13 15  Temp: 36.4 C 36.4 C  SpO2: 93% 96%    Last Pain:  Vitals:   09/15/22 1111  TempSrc:   PainSc: 4                  Trevor Iha

## 2022-09-15 NOTE — Interval H&P Note (Signed)
History and Physical Interval Note:  09/15/2022 6:50 AM  Julie Townsend  has presented today for surgery, with the diagnosis of BRCA positive, breast cancer.  The various methods of treatment have been discussed with the patient and family. After consideration of risks, benefits and other options for treatment, the patient has consented to  Procedure(s): XI ROBOTIC ASSISTED SALPINGO OOPHORECTOMY (Bilateral) as a surgical intervention.  The patient's history has been reviewed, patient examined, no change in status, stable for surgery.  I have reviewed the patient's chart and labs.  Questions were answered to the patient's satisfaction.     Carver Fila

## 2022-09-15 NOTE — Op Note (Signed)
OPERATIVE NOTE  Pre-operative Diagnosis: BRCA2 mutation, personal history of triple negative breast cancer  Post-operative Diagnosis: same  Operation: Robotic-assisted laparoscopic bilateral salpingo-oophorectomy   Surgeon: Eugene Garnet MD  Assistant Surgeon: Antionette Char MD (an MD assistant was necessary for tissue manipulation, management of robotic instrumentation, retraction and positioning due to the complexity of the case and hospital policies).   Anesthesia: GET  Urine Output: 75 cc  Operative Findings:  : On EUA, small mobile uterus, no adnexal masses. On intra-abdominal entry, normal upper abdominal survey. Normal small bowel, appendix, colon, and omentum. Uterus 6 cm and normal appearing. Normal bilateral adnexa. Colon mesentery and epiploica adherent to the left IP ligament, medial leaf of the broad ligament and left round ligament. No ascites.   Estimated Blood Loss:  25 cc      Total IV Fluids: see I&O flowsheet         Specimens: bilateral tubes and ovaries, pelvic washings         Complications:  None apparent; patient tolerated the procedure well.         Disposition: PACU - hemodynamically stable.  Procedure Details  The patient was seen in the Holding Room. The risks, benefits, complications, treatment options, and expected outcomes were discussed with the patient.  The patient concurred with the proposed plan, giving informed consent.  The site of surgery properly noted/marked. The patient was identified as Julie Townsend and the procedure verified as a Robotic-assisted bilateral salpingo-oophorectomy with any other indicated procedures.   After induction of anesthesia, the patient was draped and prepped in the usual sterile manner. Patient was placed in supine position after anesthesia and draped and prepped in the usual sterile manner as follows: Her arms were tucked to her side with all appropriate precautions.  The patient was secured to the bed  with padding and tape across her chest.  The patient was placed in the semi-lithotomy position in [place] stirrups.  The perineum and vagina were prepped with CHG. The patient's abdomen was prepped with ChloraPrep and then she was draped after the prep had been allowed to dry for 3 minutes.  A Time Out was held and the above information confirmed.  The urethra was prepped with Betadine. Foley catheter was placed.  OG tube placement was confirmed and to suction.   Next, a 5 mm skin incision was made 1 cm below the subcostal margin in the midclavicular line.  The 5 mm Optiview port and scope was used for direct entry.  Opening pressure was under 10 mm CO2.  The abdomen was insufflated and the findings were noted as above.   At this point and all points during the procedure, the patient's intra-abdominal pressure did not exceed 15 mmHg. Next, a 12 mm skin incision was made superior to the umbilicus and 8 mm incisions placed in the right and left mid abdomen. Robotic trocars were all placed under direct visualization.  The 5 mm assist trocar was exchanged for a 5 mm airseal port. All ports were placed under direct visualization.  The patient was placed in steep Trendelenburg.  Bowel was folded away into the upper abdomen.  The robot was docked in the normal manner.  Pelvic washings were collected  The right and left peritoneum were opened parallel to the IP ligament to open the retroperitoneal spaces bilaterally. The round ligaments were preserved. The ureter was noted to be on the medial leaf of the broad ligament.  The peritoneum above the ureter was incised and  stretched and the infundibulopelvic ligament was skeletonized, cauterized and cut.  The utero-ovarian ligament and fallopian tube were skeletonized, cauterized and transected just lateral to the uterine fundus, freeing the adnexa. On the left, monopolar electrocautery was used to mobilize the sigmoid mesentery and epiploica free from the IP ligament,  round ligament and medial leaf of the broad ligament. Care was taken to assure at least 2-3 cm proximal margin on the IP from the fallopian tube and ovary. Each ovary was placed in an Endocatch bag inserted through the 12 mm robotic supraumcilical port.  Irrigation was used and excellent hemostasis was achieved.  At this point in the procedure was completed.  Robotic instruments were removed under direct visulaization.  The robot was undocked. The fascia at the 12 mm port was closed with 0 Vicryl using a PMI fascial closure device under direct visualization.  The subcuticular tissue was closed with 4-0 Vicryl and the skin was closed with 4-0 Monocryl in a subcuticular manner.  Dermabond was applied.    Foley catheter was removed.  All sponge, lap and needle counts were correct x  3.   The patient was transferred to the recovery room in stable condition.  Eugene Garnet, MD

## 2022-09-15 NOTE — Anesthesia Procedure Notes (Signed)
Procedure Name: Intubation Date/Time: 09/15/2022 8:39 AM  Performed by: Elisabeth Cara, CRNAPre-anesthesia Checklist: Patient identified, Emergency Drugs available, Suction available, Patient being monitored and Timeout performed Patient Re-evaluated:Patient Re-evaluated prior to induction Oxygen Delivery Method: Circle system utilized Preoxygenation: Pre-oxygenation with 100% oxygen Induction Type: IV induction Ventilation: Mask ventilation without difficulty Laryngoscope Size: Mac and 3 Grade View: Grade I Tube type: Oral Tube size: 7.0 mm Number of attempts: 1 Airway Equipment and Method: Stylet Placement Confirmation: ETT inserted through vocal cords under direct vision, positive ETCO2 and breath sounds checked- equal and bilateral Secured at: 21 cm Tube secured with: Tape Dental Injury: Teeth and Oropharynx as per pre-operative assessment

## 2022-09-15 NOTE — Discharge Instructions (Signed)

## 2022-09-15 NOTE — Transfer of Care (Signed)
Immediate Anesthesia Transfer of Care Note  Patient: Julie Townsend  Procedure(s) Performed: XI ROBOTIC ASSISTED SALPINGO OOPHORECTOMY (Bilateral)  Patient Location: PACU  Anesthesia Type:General  Level of Consciousness: awake, alert , oriented, and patient cooperative  Airway & Oxygen Therapy: Patient Spontanous Breathing and Patient connected to face mask oxygen  Post-op Assessment: Report given to RN, Post -op Vital signs reviewed and stable, and Patient moving all extremities  Post vital signs: Reviewed and stable  Last Vitals:  Vitals Value Taken Time  BP 125/96 09/15/22 0956  Temp    Pulse 76 09/15/22 0957  Resp 14 09/15/22 0957  SpO2 100 % 09/15/22 0957  Vitals shown include unfiled device data.  Last Pain:  Vitals:   09/15/22 0724  TempSrc:   PainSc: 4       Patients Stated Pain Goal: 5 (09/15/22 0724)  Complications: No notable events documented.

## 2022-09-16 ENCOUNTER — Encounter (HOSPITAL_COMMUNITY): Payer: Self-pay | Admitting: Gynecologic Oncology

## 2022-09-16 ENCOUNTER — Telehealth: Payer: Self-pay | Admitting: *Deleted

## 2022-09-16 LAB — CYTOLOGY - NON PAP

## 2022-09-16 LAB — SURGICAL PATHOLOGY

## 2022-09-16 NOTE — Telephone Encounter (Signed)
Spoke with Julie Townsend this morning. She states she is eating, drinking and urinating well. She has not had a BM yet but is passing gas. She is taking senokot as prescribed and encouraged her to drink plenty of water. She denies fever or chills. Incisions are dry and intact. She rates her pain 7/10. Her pain is controlled with ibuprofen.    Instructed to call office with any fever, chills, purulent drainage, uncontrolled pain or any other questions or concerns. Patient verbalizes understanding.   Pt aware of post op appointments as well as the office number [phone] and after hours number [phone] to call if she has any questions or concerns

## 2022-09-21 ENCOUNTER — Ambulatory Visit (INDEPENDENT_AMBULATORY_CARE_PROVIDER_SITE_OTHER): Payer: Commercial Managed Care - HMO | Admitting: Physician Assistant

## 2022-09-21 DIAGNOSIS — Z9889 Other specified postprocedural states: Secondary | ICD-10-CM

## 2022-09-21 NOTE — Progress Notes (Signed)
Patient is a pleasant 57 year old female s/p right-sided breast reconstruction with latissimus muscle flap performed 07/26/2022 Dr. Ulice Bold who presents to clinic for postoperative follow-up.   Current expander volumes equals 100 cc on the left and 20/300 cc on the right.  She does not want to be any larger on the left side.  She was last seen here in clinic on 09/07/2022.  At that time, her right-sided low back tenderness and erythema had resolved since initiating antibiotics and her latissimus dorsi harvest site appeared well-healed.  However, there is some erythema on the most medial aspect of the latissimus dorsi paddle, right anterior chest wall.  No induration, crepitus, or obvious fluid collections.  No wounds or drainage appreciated.  Dr. Ulice Bold personally evaluated patient and would like for her to receive a right side expander fill prior to implant placement, but would advised against fill that day given her redness.  Instead, recommended oral antihistamines in the event that it is histamine mediated.  Today, patient is doing well.  She is accompanied by her husband Onalee Hua at bedside.  They feel as though the redness has improved significantly since last encounter.  She does inquire about possible tissue closure defect at the site of her latissimus dorsi harvest.  Otherwise, doing well and recovering from her recent bilateral salpingo-oophorectomy.  Specifically, denies any leg swelling, chest pain, fevers, or difficulty breathing.  On exam, erythema over the anterior chest wall right side has improved significantly.  Tenderness is also improved since last encounter.  She does have what appears to be a slight tissue closure defect on the most anterior aspect of latissimus dorsi harvest site.  See pictures.  Given her exam was much improved today, will proceed with expander fill so that we can schedule her for bilateral implant exchange.  Proceeded cautiously and provided 30 cc expander fill on  the right side for a total of 50/300.  She has 100 cc on the left side.  Given improved redness, feel as though she can move forward with implant change.  She had been scheduled for an MRI for pancreatic cancer screening that had to be canceled due to expanders in place.  For this reason, she is hopeful that implant surgery can be scheduled sooner rather than later.  Will message surgical coordinator and surgeon about proceeding.  Picture(s) obtained of the patient and placed in the chart were with the patient's or guardian's permission.

## 2022-09-22 ENCOUNTER — Other Ambulatory Visit: Payer: Self-pay | Admitting: Plastic Surgery

## 2022-09-22 DIAGNOSIS — Z9011 Acquired absence of right breast and nipple: Secondary | ICD-10-CM

## 2022-09-22 DIAGNOSIS — Z1501 Genetic susceptibility to malignant neoplasm of breast: Secondary | ICD-10-CM

## 2022-09-22 NOTE — Progress Notes (Signed)
Surgical date request

## 2022-09-25 ENCOUNTER — Ambulatory Visit (HOSPITAL_COMMUNITY): Payer: Commercial Managed Care - HMO

## 2022-09-27 NOTE — Telephone Encounter (Signed)
See message.

## 2022-10-08 NOTE — Telephone Encounter (Signed)
Called pt per MD to advise [facility] testing was negative/not detected. Pt verbalized understanding of results and knows [facility] will be in touch to schedule 3 mo repeat lab.   

## 2022-10-12 ENCOUNTER — Ambulatory Visit (INDEPENDENT_AMBULATORY_CARE_PROVIDER_SITE_OTHER): Payer: Commercial Managed Care - HMO | Admitting: Student

## 2022-10-12 VITALS — BP 135/90 | HR 80

## 2022-10-12 DIAGNOSIS — Z9011 Acquired absence of right breast and nipple: Secondary | ICD-10-CM

## 2022-10-12 DIAGNOSIS — Z9889 Other specified postprocedural states: Secondary | ICD-10-CM

## 2022-10-12 MED ORDER — CEPHALEXIN 500 MG PO CAPS: 500.0000 mg | ORAL_CAPSULE | Freq: Four times a day (QID) | ORAL | 0 refills | Status: AC

## 2022-10-12 MED ORDER — ONDANSETRON HCL 4 MG PO TABS: 4.0000 mg | ORAL_TABLET | Freq: Three times a day (TID) | ORAL | 0 refills | Status: DC | PRN

## 2022-10-12 MED ORDER — OXYCODONE HCL 5 MG PO TABS: 5.0000 mg | ORAL_TABLET | Freq: Four times a day (QID) | ORAL | 0 refills | Status: DC | PRN

## 2022-10-12 NOTE — H&P (View-Only) (Signed)
Patient ID: Julie Townsend, female    DOB: [birth year], 57 y.o.   MRN: [id number]  Chief Complaint  Patient presents with   Pre-op Exam      ICD-10-CM   1. Acquired absence of right breast  Z90.11     2. S/P breast reconstruction  Z98.890        History of Present Illness: Julie Townsend is a 57 y.o.  female  with a history of breast cancer.  She presents for preoperative evaluation for upcoming procedure, removal of bilateral tissue expanders and placement of implants, scheduled for 10/28/2022 with Dr. Ulice Bold.  The patient has not had problems with anesthesia.  Patient denies any history of cardiac disease.  She denies taking any blood thinners.  Patient reports she is not a smoker.  Patient denies taking any birth control or hormone replacement.  She does report history of 1 miscarriage.  She denies any personal or family history of blood clots or clotting diseases.  She does reports she had surgery on August 14, she states she had a salpingo-oophorectomy.  She states that she has been recovering well from this.  She denies any other recent traumas, infections, surgeries.  She denies any history of stroke or heart attack.  She denies any history of Crohn's disease or ulcerative colitis.  She denies any history of COPD or asthma.  She does have history of cancer.  She denies any varicosities to her lower extremities.  She denies any fevers or chills.  Patient states that she would like silicone implants at this time.  She states that she would possibly want saline, she is going to let us know by the end of the week if she changes her mind from the silicone implants.  Patient is also inquiring if she can have dogear repair from latissimus flap surgery.  Summary of Previous Visit: Patient was last seen in the clinic on 09/21/2022.  At this visit, patient was doing well.  Her redness had significantly improved since her previous encounter.  Patient was inquiring about  possible tissue closure defect at the site of her latissimus dorsi harvest.  She otherwise was doing well and recovering from her bilateral salpingo-oophorectomy.  On exam, patient's erythema over her anterior chest wall had improved significantly.  Tenderness had also improved.  She did have what appeared to be a slight tissue closure defect on the most anterior aspect of the latissimus dorsi harvest site.  Pictures were taken at the time of exam.  Expander fill was done at this visit.  There was a total of 50 cc / 300 cc in the right expander and 100 cc in the left expander.   Job: Patient works from home and makes her own schedule  PMH Significant for: Breast cancer   Past Medical History: Allergies: Allergies  Allergen Reactions   Bee Venom Anaphylaxis    Yellow jackets only    Current Medications:  Current Outpatient Medications:    EPINEPHrine 0.3 mg/0.3 mL IJ SOAJ injection, Inject 0.3 mg into the muscle as needed for anaphylaxis., Disp: 1 each, Rfl: 1   estradiol (ESTRACE) 0.1 MG/GM vaginal cream, Place 4 g vaginally 2 (two) times a week., Disp: , Rfl:    Galcanezumab-gnlm (EMGALITY) 120 MG/ML SOAJ, Inject 120 mg into the skin every 28 (twenty-eight) days., Disp: 1 mL, Rfl: 11   ibuprofen (ADVIL) 200 MG tablet, Take 600 mg by mouth every 6 (six) hours as needed for moderate pain or  headache., Disp: , Rfl:    oxyCODONE (OXY IR/ROXICODONE) 5 MG immediate release tablet, Take 1 tablet (5 mg total) by mouth every 4 (four) hours as needed for severe pain. For AFTER surgery only, do not take and drive, Disp: 15 tablet, Rfl: 0   senna-docusate (SENOKOT-S) 8.6-50 MG tablet, Take 2 tablets by mouth at bedtime. For AFTER surgery, do not take if having diarrhea, Disp: 30 tablet, Rfl: 0   Ubrogepant (UBRELVY) 100 MG TABS, TAKE 1 TABLET AT THE EARLIST ONSET OF A MIGRAINE. MAY REPEAT IN 2 HOURS. 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  Report date is September 26, 2021.    The BRCAplus panel offered by [employer] and includes sequencing and deletion/duplication analysis for the following 8 genes: ATM, BRCA1, BRCA2, CDH1, CHEK2, PALB2, PTEN, and TP53.  Results of pan-cancer panel are pending.     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    correct Hands   Port-A-Cath in place 03/10/2022    Past Surgical History: Past Surgical History:  Procedure Laterality Date   BREAST IMPLANT REMOVAL Right 12/17/2021   Procedure: Right breast expander removal with washout;  Surgeon: Peggye Form, DO;  Location: [facility];  Service: Plastics;  Laterality: Right;   BREAST RECONSTRUCTION WITH PLACEMENT OF TISSUE EXPANDER AND FLEX HD (ACELLULAR HYDRATED DERMIS) Bilateral 10/29/2021   Procedure: BREAST RECONSTRUCTION WITH PLACEMENT OF TISSUE EXPANDER AND FLEX HD (ACELLULAR HYDRATED DERMIS);  Surgeon: Peggye Form, DO;  Location: [facility];  Service: Plastics;  Laterality: Bilateral;   BREAST RECONSTRUCTION WITH PLACEMENT OF TISSUE EXPANDER AND FLEX HD (ACELLULAR HYDRATED DERMIS) Right 07/26/2022   Procedure: PLACEMENT OF TISSUE EXPANDER;  Surgeon: Peggye Form, DO;  Location: [facility];  Service: Plastics;  Laterality: Right;   CARPAL TUNNEL RELEASE Right 06/20/2020   Procedure: CARPAL TUNNEL RELEASE;  Surgeon: Betha Loa, MD;  Location: [facility];  Service: Orthopedics;  Laterality: Right;   DILATION AND CURETTAGE OF UTERUS     IRRIGATION AND DEBRIDEMENT OF  WOUND WITH SPLIT THICKNESS SKIN GRAFT Right 03/15/2022   Procedure: Debridement of right breast;  Surgeon: Peggye Form, DO;  Location: [facility];  Service: Plastics;  Laterality: Right;   LATISSIMUS FLAP TO BREAST Right 07/26/2022   Procedure: Right breast reconstruction and latissimus muscle flap;  Surgeon: Peggye Form, DO;  Location: [facility];  Service: Plastics;  Laterality: Right;   MASTECTOMY     MASTECTOMY W/ SENTINEL NODE BIOPSY Left 10/29/2021   Procedure: LEFT MASTECTOMY WITH LEFT SENTINEL LYMPH NODE BIOPSY;  Surgeon: Abigail Miyamoto, MD;  Location: [facility];  Service: General;  Laterality: Left;   PORT-A-CATH REMOVAL N/A 07/26/2022   Procedure: PORT-A-CATH REMOVAL;  Surgeon: Abigail Miyamoto, MD;  [facility];  Service: General;  Laterality: N/A;   PORTACATH PLACEMENT Right 10/29/2021   Procedure: INSERTION PORT-A-CATH;  Surgeon: Abigail Miyamoto, MD;  Location: [facility];  Service: General;  Laterality: Right;   ROBOTIC ASSISTED SALPINGO OOPHERECTOMY Bilateral 09/15/2022   Procedure: XI ROBOTIC ASSISTED SALPINGO OOPHORECTOMY;  Surgeon: Carver Fila, MD;  Location: [facility];  Service: Gynecology;  Laterality: Bilateral;   TOTAL MASTECTOMY Right 10/29/2021   Procedure: RIGHT TOTAL MASTECTOMY;  Surgeon: Abigail Miyamoto, MD;  Location: [facility];  Service: General;  Laterality: Right;   TRIGGER FINGER RELEASE Left 05/08/2020   Procedure: LEFT LONG FINGER TRIGGER RELEASE AND LEFT RING FINGER TRIGGER RELEASE;  Surgeon: Betha Loa, MD;  Location: [facility];  Service: Orthopedics;  Laterality: Left;   trigger finger release rt hand      Social History: Social History   Socioeconomic History   Marital status: Married    Spouse name: Not on file   Number of children: 2   Years of education: Not on file   Highest education level: Not on file  Occupational History   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   Right handed   Social Determinants of Health   Financial Resource Strain: Not on file  Food Insecurity: Not on file  Transportation Needs: Not on file  Physical Activity: Not on file  Stress: Not on file  Social Connections: Not on file  Intimate Partner Violence: Not on file    Family History: Family History  Problem Relation Age of Onset   Migraines Mother    Breast cancer Mother 29       recurr at  81   Migraines Maternal Aunt    Colon cancer Maternal Uncle        dx late 55s   Pancreatic cancer Maternal Uncle 28   Migraines Maternal Grandmother    Ovarian cancer Maternal Grandmother        dx 2s    Review of Systems: Denies any fevers chills or changes in her health  Physical Exam: Vital Signs BP (!) 135/90 (BP Location: Left Arm, Patient Position: Sitting, Cuff Size: Normal) Comment: pt stated she is experiencing a little pain today  Pulse 80   SpO2 99%   Physical Exam  Constitutional:      General: Not in acute distress.    Appearance: Normal appearance. Not ill-appearing.  HENT:     Head: Normocephalic and atraumatic.  Eyes:     Pupils: Pupils are equal, round Neck:     Musculoskeletal: Normal range of motion.  Cardiovascular:     Rate and Rhythm: Normal rate    Pulses: Normal pulses.  Pulmonary:     Effort: Pulmonary effort is normal. No respiratory distress.  Musculoskeletal: Normal range of motion.  Skin:    General: Skin is warm and dry.     Findings: No erythema or rash.  Neurological:     Mental Status: Alert and oriented to person, place, and  time. Mental status is at baseline.  Psychiatric:        Mood and Affect: Mood normal.        Behavior: Behavior normal.    Assessment/Plan: The patient is scheduled for removal  of bilateral tissue expanders and placement of implants with Dr. Ulice Bold.  Risks, benefits, and alternatives of procedure discussed, questions answered and consent obtained.    Smoking Status: Non-smoker; Counseling Given?  N/A Last Mammogram: Status post mastectomies  Caprini Score: 5; Risk Factors include: Age, history of malignancy, and length of planned surgery. Recommendation for mechanical prophylaxis. Encourage early ambulation.   Pictures obtained: @consult   Post-op Rx sent to pharmacy: Oxycodone, Zofran, Keflex  Instructed patient to hold Ubrelvy the morning of surgery and to hold her ibuprofen at least 1 week prior to surgery.  Patient expressed understanding.  Will discussed with Dr. Ulice Bold if tissue defect can be repaired at the same time of surgery.  Patient was provided with the [employer] consent form, National breast implant registry checklist General Surgical Risk consent document and Pain Medication Agreement prior to their appointment.  They had adequate time to read through the risk consent documents and Pain Medication Agreement. We also discussed them in person together during this preop appointment. All of their questions were answered to their satisfaction.  Recommended calling if they have any further questions.  Risk consent form and Pain Medication Agreement to be scanned into patient's chart.  The risk that can be encountered with breast augmentation or maastopexy were discussed and include the following but not limited to these:  Breast asymmetry, fluid accumulation, firmness of the breast, inability to breast feed, loss of nipple or areola, skin loss, decrease or no nipple sensation, fat necrosis of the breast tissue, bleeding, infection, healing delay.  Deep vein thrombosis, cardiac  and pulmonary complications are risks to any procedure. The implant can have a faulty position or one different from what you had desired.  The implant can have rippling, wrinkling, leakage or rupture. There are risks of anesthesia, changes to skin sensation and injury to nerves or blood vessels.  The muscle can be temporarily or permanently injured.  You may have an allergic reaction to tape, suture, glue, blood products which can result in skin discoloration, swelling, pain, skin lesions, poor healing.  Any of these can lead to the need for revisonal surgery or stage procedures.  A reduction has potential to interfere with diagnostic procedures.  Nipple or breast piercing can increase risks of infection.    This procedure is best done when the breast is fully developed.  Changes in the breast will continue to occur over time.  Pregnancy can alter the outcomes of previous breast reduction surgery, weight gain and weigh loss can also effect the long term appearance. Implants are not guaranteed to last a lifetime.  Future surgery may be required.  Regular examinations of the breast are required to evaluate the condition of your breasts and implants.    Electronically signed by: Laurena Spies, PA-C 10/12/2022 4:33 PM

## 2022-10-12 NOTE — Progress Notes (Signed)
Patient ID: Julie Townsend, female    DOB: [birth year], 57 y.o.   MRN: [id number]  Chief Complaint  Patient presents with   Pre-op Exam      ICD-10-CM   1. Acquired absence of right breast  Z90.11     2. S/P breast reconstruction  Z98.890        History of Present Illness: Julie Townsend is a 57 y.o.  female  with a history of breast cancer.  She presents for preoperative evaluation for upcoming procedure, removal of bilateral tissue expanders and placement of implants, scheduled for 10/28/2022 with Dr. Ulice Bold.  The patient has not had problems with anesthesia.  Patient denies any history of cardiac disease.  She denies taking any blood thinners.  Patient reports she is not a smoker.  Patient denies taking any birth control or hormone replacement.  She does report history of 1 miscarriage.  She denies any personal or family history of blood clots or clotting diseases.  She does reports she had surgery on August 14, she states she had a salpingo-oophorectomy.  She states that she has been recovering well from this.  She denies any other recent traumas, infections, surgeries.  She denies any history of stroke or heart attack.  She denies any history of Crohn's disease or ulcerative colitis.  She denies any history of COPD or asthma.  She does have history of cancer.  She denies any varicosities to her lower extremities.  She denies any fevers or chills.  Patient states that she would like silicone implants at this time.  She states that she would possibly want saline, she is going to let us know by the end of the week if she changes her mind from the silicone implants.  Patient is also inquiring if she can have dogear repair from latissimus flap surgery.  Summary of Previous Visit: Patient was last seen in the clinic on 09/21/2022.  At this visit, patient was doing well.  Her redness had significantly improved since her previous encounter.  Patient was inquiring about  possible tissue closure defect at the site of her latissimus dorsi harvest.  She otherwise was doing well and recovering from her bilateral salpingo-oophorectomy.  On exam, patient's erythema over her anterior chest wall had improved significantly.  Tenderness had also improved.  She did have what appeared to be a slight tissue closure defect on the most anterior aspect of the latissimus dorsi harvest site.  Pictures were taken at the time of exam.  Expander fill was done at this visit.  There was a total of 50 cc / 300 cc in the right expander and 100 cc in the left expander.   Job: Patient works from home and makes her own schedule  PMH Significant for: Breast cancer   Past Medical History: Allergies: Allergies  Allergen Reactions   Bee Venom Anaphylaxis    Yellow jackets only    Current Medications:  Current Outpatient Medications:    EPINEPHrine 0.3 mg/0.3 mL IJ SOAJ injection, Inject 0.3 mg into the muscle as needed for anaphylaxis., Disp: 1 each, Rfl: 1   estradiol (ESTRACE) 0.1 MG/GM vaginal cream, Place 4 g vaginally 2 (two) times a week., Disp: , Rfl:    Galcanezumab-gnlm (EMGALITY) 120 MG/ML SOAJ, Inject 120 mg into the skin every 28 (twenty-eight) days., Disp: 1 mL, Rfl: 11   ibuprofen (ADVIL) 200 MG tablet, Take 600 mg by mouth every 6 (six) hours as needed for moderate pain or  headache., Disp: , Rfl:    oxyCODONE (OXY IR/ROXICODONE) 5 MG immediate release tablet, Take 1 tablet (5 mg total) by mouth every 4 (four) hours as needed for severe pain. For AFTER surgery only, do not take and drive, Disp: 15 tablet, Rfl: 0   senna-docusate (SENOKOT-S) 8.6-50 MG tablet, Take 2 tablets by mouth at bedtime. For AFTER surgery, do not take if having diarrhea, Disp: 30 tablet, Rfl: 0   Ubrogepant (UBRELVY) 100 MG TABS, TAKE 1 TABLET AT THE EARLIST ONSET OF A MIGRAINE. MAY REPEAT IN 2 HOURS. 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  Report date is September 26, 2021.    The BRCAplus panel offered by [employer] and includes sequencing and deletion/duplication analysis for the following 8 genes: ATM, BRCA1, BRCA2, CDH1, CHEK2, PALB2, PTEN, and TP53.  Results of pan-cancer panel are pending.     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    correct Hands   Port-A-Cath in place 03/10/2022    Past Surgical History: Past Surgical History:  Procedure Laterality Date   BREAST IMPLANT REMOVAL Right 12/17/2021   Procedure: Right breast expander removal with washout;  Surgeon: Peggye Form, DO;  Location: [facility];  Service: Plastics;  Laterality: Right;   BREAST RECONSTRUCTION WITH PLACEMENT OF TISSUE EXPANDER AND FLEX HD (ACELLULAR HYDRATED DERMIS) Bilateral 10/29/2021   Procedure: BREAST RECONSTRUCTION WITH PLACEMENT OF TISSUE EXPANDER AND FLEX HD (ACELLULAR HYDRATED DERMIS);  Surgeon: Peggye Form, DO;  Location: [facility];  Service: Plastics;  Laterality: Bilateral;   BREAST RECONSTRUCTION WITH PLACEMENT OF TISSUE EXPANDER AND FLEX HD (ACELLULAR HYDRATED DERMIS) Right 07/26/2022   Procedure: PLACEMENT OF TISSUE EXPANDER;  Surgeon: Peggye Form, DO;  Location: [facility];  Service: Plastics;  Laterality: Right;   CARPAL TUNNEL RELEASE Right 06/20/2020   Procedure: CARPAL TUNNEL RELEASE;  Surgeon: Betha Loa, MD;  Location: [facility];  Service: Orthopedics;  Laterality: Right;   DILATION AND CURETTAGE OF UTERUS     IRRIGATION AND DEBRIDEMENT OF  WOUND WITH SPLIT THICKNESS SKIN GRAFT Right 03/15/2022   Procedure: Debridement of right breast;  Surgeon: Peggye Form, DO;  Location: [facility];  Service: Plastics;  Laterality: Right;   LATISSIMUS FLAP TO BREAST Right 07/26/2022   Procedure: Right breast reconstruction and latissimus muscle flap;  Surgeon: Peggye Form, DO;  Location: [facility];  Service: Plastics;  Laterality: Right;   MASTECTOMY     MASTECTOMY W/ SENTINEL NODE BIOPSY Left 10/29/2021   Procedure: LEFT MASTECTOMY WITH LEFT SENTINEL LYMPH NODE BIOPSY;  Surgeon: Abigail Miyamoto, MD;  Location: [facility];  Service: General;  Laterality: Left;   PORT-A-CATH REMOVAL N/A 07/26/2022   Procedure: PORT-A-CATH REMOVAL;  Surgeon: Abigail Miyamoto, MD;  [facility];  Service: General;  Laterality: N/A;   PORTACATH PLACEMENT Right 10/29/2021   Procedure: INSERTION PORT-A-CATH;  Surgeon: Abigail Miyamoto, MD;  Location: [facility];  Service: General;  Laterality: Right;   ROBOTIC ASSISTED SALPINGO OOPHERECTOMY Bilateral 09/15/2022   Procedure: XI ROBOTIC ASSISTED SALPINGO OOPHORECTOMY;  Surgeon: Carver Fila, MD;  Location: [facility];  Service: Gynecology;  Laterality: Bilateral;   TOTAL MASTECTOMY Right 10/29/2021   Procedure: RIGHT TOTAL MASTECTOMY;  Surgeon: Abigail Miyamoto, MD;  Location: [facility];  Service: General;  Laterality: Right;   TRIGGER FINGER RELEASE Left 05/08/2020   Procedure: LEFT LONG FINGER TRIGGER RELEASE AND LEFT RING FINGER TRIGGER RELEASE;  Surgeon: Betha Loa, MD;  Location: [facility];  Service: Orthopedics;  Laterality: Left;   trigger finger release rt hand      Social History: Social History   Socioeconomic History   Marital status: Married    Spouse name: Not on file   Number of children: 2   Years of education: Not on file   Highest education level: Not on file  Occupational History   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   Right handed   Social Determinants of Health   Financial Resource Strain: Not on file  Food Insecurity: Not on file  Transportation Needs: Not on file  Physical Activity: Not on file  Stress: Not on file  Social Connections: Not on file  Intimate Partner Violence: Not on file    Family History: Family History  Problem Relation Age of Onset   Migraines Mother    Breast cancer Mother 29       recurr at  81   Migraines Maternal Aunt    Colon cancer Maternal Uncle        dx late 55s   Pancreatic cancer Maternal Uncle 28   Migraines Maternal Grandmother    Ovarian cancer Maternal Grandmother        dx 2s    Review of Systems: Denies any fevers chills or changes in her health  Physical Exam: Vital Signs BP (!) 135/90 (BP Location: Left Arm, Patient Position: Sitting, Cuff Size: Normal) Comment: pt stated she is experiencing a little pain today  Pulse 80   SpO2 99%   Physical Exam  Constitutional:      General: Not in acute distress.    Appearance: Normal appearance. Not ill-appearing.  HENT:     Head: Normocephalic and atraumatic.  Eyes:     Pupils: Pupils are equal, round Neck:     Musculoskeletal: Normal range of motion.  Cardiovascular:     Rate and Rhythm: Normal rate    Pulses: Normal pulses.  Pulmonary:     Effort: Pulmonary effort is normal. No respiratory distress.  Musculoskeletal: Normal range of motion.  Skin:    General: Skin is warm and dry.     Findings: No erythema or rash.  Neurological:     Mental Status: Alert and oriented to person, place, and  time. Mental status is at baseline.  Psychiatric:        Mood and Affect: Mood normal.        Behavior: Behavior normal.    Assessment/Plan: The patient is scheduled for removal  of bilateral tissue expanders and placement of implants with Dr. Ulice Bold.  Risks, benefits, and alternatives of procedure discussed, questions answered and consent obtained.    Smoking Status: Non-smoker; Counseling Given?  N/A Last Mammogram: Status post mastectomies  Caprini Score: 5; Risk Factors include: Age, history of malignancy, and length of planned surgery. Recommendation for mechanical prophylaxis. Encourage early ambulation.   Pictures obtained: @consult   Post-op Rx sent to pharmacy: Oxycodone, Zofran, Keflex  Instructed patient to hold Ubrelvy the morning of surgery and to hold her ibuprofen at least 1 week prior to surgery.  Patient expressed understanding.  Will discussed with Dr. Ulice Bold if tissue defect can be repaired at the same time of surgery.  Patient was provided with the [employer] consent form, National breast implant registry checklist General Surgical Risk consent document and Pain Medication Agreement prior to their appointment.  They had adequate time to read through the risk consent documents and Pain Medication Agreement. We also discussed them in person together during this preop appointment. All of their questions were answered to their satisfaction.  Recommended calling if they have any further questions.  Risk consent form and Pain Medication Agreement to be scanned into patient's chart.  The risk that can be encountered with breast augmentation or maastopexy were discussed and include the following but not limited to these:  Breast asymmetry, fluid accumulation, firmness of the breast, inability to breast feed, loss of nipple or areola, skin loss, decrease or no nipple sensation, fat necrosis of the breast tissue, bleeding, infection, healing delay.  Deep vein thrombosis, cardiac  and pulmonary complications are risks to any procedure. The implant can have a faulty position or one different from what you had desired.  The implant can have rippling, wrinkling, leakage or rupture. There are risks of anesthesia, changes to skin sensation and injury to nerves or blood vessels.  The muscle can be temporarily or permanently injured.  You may have an allergic reaction to tape, suture, glue, blood products which can result in skin discoloration, swelling, pain, skin lesions, poor healing.  Any of these can lead to the need for revisonal surgery or stage procedures.  A reduction has potential to interfere with diagnostic procedures.  Nipple or breast piercing can increase risks of infection.    This procedure is best done when the breast is fully developed.  Changes in the breast will continue to occur over time.  Pregnancy can alter the outcomes of previous breast reduction surgery, weight gain and weigh loss can also effect the long term appearance. Implants are not guaranteed to last a lifetime.  Future surgery may be required.  Regular examinations of the breast are required to evaluate the condition of your breasts and implants.    Electronically signed by: Laurena Spies, PA-C 10/12/2022 4:33 PM

## 2022-10-14 ENCOUNTER — Encounter: Payer: Self-pay | Admitting: Gynecologic Oncology

## 2022-10-14 ENCOUNTER — Inpatient Hospital Stay: Payer: Commercial Managed Care - HMO | Attending: Hematology and Oncology | Admitting: Gynecologic Oncology

## 2022-10-14 VITALS — BP 129/88 | HR 85 | Temp 98.0°F | Resp 16 | Wt 118.4 lb

## 2022-10-14 DIAGNOSIS — Z800 Family history of malignant neoplasm of digestive organs: Secondary | ICD-10-CM

## 2022-10-14 DIAGNOSIS — Z90722 Acquired absence of ovaries, bilateral: Secondary | ICD-10-CM

## 2022-10-14 DIAGNOSIS — Z1509 Genetic susceptibility to other malignant neoplasm: Secondary | ICD-10-CM

## 2022-10-14 DIAGNOSIS — Z1502 Genetic susceptibility to malignant neoplasm of ovary: Secondary | ICD-10-CM

## 2022-10-14 DIAGNOSIS — Z803 Family history of malignant neoplasm of breast: Secondary | ICD-10-CM

## 2022-10-14 DIAGNOSIS — Z8041 Family history of malignant neoplasm of ovary: Secondary | ICD-10-CM

## 2022-10-14 DIAGNOSIS — Z148 Genetic carrier of other disease: Secondary | ICD-10-CM

## 2022-10-14 DIAGNOSIS — Z7189 Other specified counseling: Secondary | ICD-10-CM

## 2022-10-14 DIAGNOSIS — Z1501 Genetic susceptibility to malignant neoplasm of breast: Secondary | ICD-10-CM

## 2022-10-14 NOTE — Progress Notes (Signed)
Gynecologic Oncology Return Clinic Visit  10/14/22  Reason for Visit: follow-up  Treatment History: Oncology History  Malignant neoplasm of lower-outer quadrant of left breast of female, estrogen receptor negative (HCC)  09/07/2021 Initial Diagnosis   Screening mammogram detected left breast asymmetry posteriorly.  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, Ki-67 70%   09/14/2021 Cancer Staging   Staging form: Breast, AJCC 8th Edition - Clinical stage from 09/14/2021: Stage IB (cT1c, cN0, cM0, G3, ER-, PR-, HER2-) - Signed by Ronny Bacon, PA-C on 09/14/2021 Stage prefix: Initial diagnosis Method of lymph node assessment: Clinical Histologic grading system: 3 grade system   09/26/2021 Genetic Testing   Pathogenic variant in BRCA2 at p.V7846* (c.3922G>T).  Report date is September 26, 2021 (BRCAPlus) and September 29, 2021 (expanded).    The CancerNext-Expanded gene panel offered by [facility] and includes sequencing, rearrangement, and RNA analysis for the following 77 genes: AIP, ALK, APC, ATM, AXIN2, BAP1, BARD1, BLM, BMPR1A, BRCA1, BRCA2, BRIP1, CDC73, CDH1, CDK4, CDKN1B, CDKN2A, CHEK2, CTNNA1, DICER1, FANCC, FH, FLCN, GALNT12, KIF1B, LZTR1, MAX, MEN1, MET, MLH1, MSH2, MSH3, MSH6, MUTYH, NBN, NF1, NF2, NTHL1, PALB2, PHOX2B, PMS2, POT1, PRKAR1A, PTCH1, PTEN, RAD51C, RAD51D, RB1, RECQL, RET, SDHA, SDHAF2, SDHB, SDHC, SDHD, SMAD4, SMARCA4, SMARCB1, SMARCE1, STK11, SUFU, TMEM127, TP53, TSC1, TSC2, VHL and XRCC2 (sequencing and deletion/duplication); EGFR, EGLN1, HOXB13, KIT, MITF, PDGFRA, POLD1, and POLE (sequencing only); EPCAM and GREM1 (deletion/duplication only).    10/29/2021 Surgery   Bilateral mastectomies Right mastectomy: Benign, PASH Left mastectomy: Grade 3 IDC with DCIS 1.2 cm, margins negative, 0/5 sentinel lymph nodes negative, ER 0%, PR 0%, HER2 negative, Ki-67 70%   12/03/2021 - 06/03/2022  Chemotherapy   Patient is on Treatment Plan : BREAST ADJUVANT DOSE DENSE AC q14d / PACLitaxel q7d      09/15/22: Robotic BSO  Interval History: Doing well, had some incisional pain for several days after surgery.  Otherwise denies any significant abdominal or pelvic pain.  Reports baseline bowel bladder function.  Past Medical/Surgical History: Past Medical History:  Diagnosis Date   BRCA2 gene mutation positive 09/26/2021   Breast cancer (HCC) 09/2021   left breast IDC   Family history of breast cancer 09/22/2021   Family history of colon cancer    Family history of ovarian cancer 09/22/2021   Family history of pancreatic cancer 09/22/2021   Family history of prostate cancer    Genetic testing 09/26/2021   Pathogenic variant in BRCA2 at p.N6295* (c.3922G>T).  Report date is September 26, 2021.    The BRCAplus panel offered by [facility] and includes sequencing and deletion/duplication analysis for the following 8 genes: ATM, BRCA1, BRCA2, CDH1, CHEK2, PALB2, PTEN, and TP53.  Results of pan-cancer panel are pending.     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    correct Hands   Port-A-Cath in place 03/10/2022    Past Surgical History:  Procedure Laterality Date   BREAST IMPLANT REMOVAL Right 12/17/2021   Procedure: Right breast expander removal with washout;  Surgeon: Peggye Form, DO;  Location: [facility];  Service: Plastics;  Laterality: Right;   BREAST RECONSTRUCTION WITH PLACEMENT OF TISSUE EXPANDER AND FLEX HD (ACELLULAR HYDRATED DERMIS) Bilateral 10/29/2021   Procedure: BREAST RECONSTRUCTION WITH PLACEMENT OF TISSUE EXPANDER AND FLEX HD (ACELLULAR HYDRATED DERMIS);  Surgeon: Peggye Form, DO;  Location: [facility];  Service:  Plastics;  Laterality: Bilateral;   BREAST RECONSTRUCTION WITH PLACEMENT OF TISSUE EXPANDER AND FLEX HD (ACELLULAR HYDRATED DERMIS) Right 07/26/2022   Procedure: PLACEMENT OF TISSUE  EXPANDER;  Surgeon: Peggye Form, DO;  Location: [facility];  Service: Plastics;  Laterality: Right;   CARPAL TUNNEL RELEASE Right 06/20/2020   Procedure: CARPAL TUNNEL RELEASE;  Surgeon: Betha Loa, MD;  Location: [facility];  Service: Orthopedics;  Laterality: Right;   DILATION AND CURETTAGE OF UTERUS     IRRIGATION AND DEBRIDEMENT OF WOUND WITH SPLIT THICKNESS SKIN GRAFT Right 03/15/2022   Procedure: Debridement of right breast;  Surgeon: Peggye Form, DO;  Location: [facility];  Service: Plastics;  Laterality: Right;   LATISSIMUS FLAP TO BREAST Right 07/26/2022   Procedure: Right breast reconstruction and latissimus muscle flap;  Surgeon: Peggye Form, DO;  Location: [facility];  Service: Plastics;  Laterality: Right;   MASTECTOMY     MASTECTOMY W/ SENTINEL NODE BIOPSY Left 10/29/2021   Procedure: LEFT MASTECTOMY WITH LEFT SENTINEL LYMPH NODE BIOPSY;  Surgeon: Abigail Miyamoto, MD;  Location: [facility];  Service: General;  Laterality: Left;   PORT-A-CATH REMOVAL N/A 07/26/2022   Procedure: PORT-A-CATH REMOVAL;  Surgeon: Abigail Miyamoto, MD;  Location: [facility];  Service: General;  Laterality: N/A;   PORTACATH PLACEMENT Right 10/29/2021   Procedure: INSERTION PORT-A-CATH;  Surgeon: Abigail Miyamoto, MD;  Location: [facility];  Service: General;  Laterality: Right;   ROBOTIC ASSISTED SALPINGO OOPHERECTOMY Bilateral 09/15/2022   Procedure: XI ROBOTIC ASSISTED SALPINGO OOPHORECTOMY;  Surgeon: Carver Fila, MD;  Location: [facility];  Service: Gynecology;  Laterality: Bilateral;   TOTAL MASTECTOMY Right 10/29/2021   Procedure: RIGHT TOTAL MASTECTOMY;  Surgeon: Abigail Miyamoto, MD;  Location: [facility];  Service: General;  Laterality: Right;   TRIGGER FINGER RELEASE Left 05/08/2020   Procedure: LEFT LONG FINGER TRIGGER RELEASE AND LEFT RING FINGER TRIGGER RELEASE;  Surgeon: Betha Loa, MD;  Location: [facility];  Service: Orthopedics;  Laterality: Left;   trigger finger release rt hand      Family History  Problem Relation Age of Onset   Migraines Mother    Breast cancer Mother 1       recurr at  2   Migraines Maternal Aunt    Colon cancer Maternal Uncle        dx late 60s   Pancreatic cancer Maternal Uncle 28   Migraines Maternal Grandmother    Ovarian cancer Maternal Grandmother        dx 49s    Social History   Socioeconomic History   Marital status: Married    Spouse name: Not on file   Number of children: 2   Years of education: Not on file   Highest education level: Not on file  Occupational History   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   Right handed   Social Determinants of Health   Financial Resource Strain: Not on file  Food Insecurity: Not on file  Transportation Needs:  Not on file  Physical Activity: Not on file  Stress: Not on file  Social Connections: Not on file    Current Medications:  Current Outpatient Medications:    cephALEXin (KEFLEX) 500 MG capsule, Take 1 capsule (500 mg total) by mouth 4 (four) times daily for 3 days., Disp: 12 capsule, Rfl: 0   EPINEPHrine 0.3 mg/0.3 mL IJ SOAJ injection, Inject 0.3 mg into the muscle as needed for anaphylaxis., Disp: 1 each, Rfl: 1   estradiol (ESTRACE) 0.1 MG/GM vaginal cream, Place 4 g vaginally 2 (two) times a week., Disp: , Rfl:    Galcanezumab-gnlm (EMGALITY) 120 MG/ML SOAJ, Inject 120 mg into the skin every 28 (twenty-eight) days., Disp: 1 mL, Rfl: 11   ibuprofen (ADVIL) 200 MG tablet, Take 600 mg by mouth every  6 (six) hours as needed for moderate pain or headache., Disp: , Rfl:    ondansetron (ZOFRAN) 4 MG tablet, Take 1 tablet (4 mg total) by mouth every 8 (eight) hours as needed for up to 20 doses for nausea or vomiting., Disp: 20 tablet, Rfl: 0   oxyCODONE (ROXICODONE) 5 MG immediate release tablet, Take 1 tablet (5 mg total) by mouth every 6 (six) hours as needed for up to 20 doses for severe pain., Disp: 20 tablet, Rfl: 0   senna-docusate (SENOKOT-S) 8.6-50 MG tablet, Take 2 tablets by mouth at bedtime. For AFTER surgery, do not take if having diarrhea, Disp: 30 tablet, Rfl: 0   Ubrogepant (UBRELVY) 100 MG TABS, TAKE 1 TABLET AT THE EARLIST ONSET OF A MIGRAINE. MAY REPEAT IN 2 HOURS. MAX 2/24 HOURS., Disp: 16 tablet, Rfl: 11  Review of Systems: + migraine Denies appetite changes, fevers, chills, fatigue, unexplained weight changes. Denies hearing loss, neck lumps or masses, mouth sores, ringing in ears or voice changes. Denies cough or wheezing.  Denies shortness of breath. Denies chest pain or palpitations. Denies leg swelling. Denies abdominal distention, pain, blood in stools, constipation, diarrhea, nausea, vomiting, or early satiety. Denies pain with intercourse, dysuria, frequency, hematuria or incontinence. Denies hot flashes, pelvic pain, vaginal bleeding or vaginal discharge.   Denies joint pain, back pain or muscle pain/cramps. Denies itching, rash, or wounds. Denies dizziness, numbness or seizures. Denies swollen lymph nodes or glands, denies easy bruising or bleeding. Denies anxiety, depression, confusion, or decreased concentration.  Physical Exam: BP 129/88 (BP Location: Left Arm, Patient Position: Sitting)   Pulse 85   Temp 98 F (36.7 C) (Oral)   Resp 16   Wt 118 lb 6.4 oz (53.7 kg)   SpO2 99%   BMI 21.66 kg/m  General: Alert, oriented, no acute distress. HEENT: Normocephalic, atraumatic, sclera anicteric. Chest: Unlabored breathing on room air. Abdomen: soft,  nontender.  Normoactive bowel sounds.  No masses or hepatosplenomegaly appreciated.  Well-healed incisions.  Laboratory & Radiologic Studies: A. OVARY AND FALLOPIAN TUBE, RIGHT, SALPINGO OOPHORECTOMY:  -  Unremarkable fallopian tube.  -  Unremarkable senescent ovary   B. OVARY AND FALLOPIAN TUBE, LEFT, SALPINGO OOPHORECTOMY:  -  Unremarkable fallopian tube with incidental paratubal cyst.  -  Unremarkable senescent ovary with an incidental simple cyst.   Note: The clinical history of BRCA positive is noted.  The fallopian  tubes and ovaries were submitted in their entirety for histopathologic  examination.   Cytology FINAL MICROSCOPIC DIAGNOSIS:  - No malignant cells identified   Assessment & Plan: Julie Townsend is a 57 y.o. woman s/p robotic BSO in the setting of BRCA2 mutation.  Patient is  doing very well, meeting postoperative milestones.  Discussed continued expectations and restrictions.  Reviewed pathology from surgery.  She was given a copy of her pathology report.  We discussed that bilateral salpingo-oophorectomy significantly decreases her risk of ovarian cancer however there remains a small risk of primary peritoneal cancer.  I reviewed signs and symptoms that would be concerning for development of primary peritoneal cancer and stressed the importance of reaching out to her primary care provider or medical oncologist if she were to develop any of these.    16 minutes of total time was spent for this patient encounter, including preparation, face-to-face counseling with the patient and coordination of care, and documentation of the encounter.  Eugene Garnet, MD  Division of Gynecologic Oncology  Department of Obstetrics and Gynecology  [facility]

## 2022-10-20 ENCOUNTER — Other Ambulatory Visit: Payer: Self-pay

## 2022-10-20 ENCOUNTER — Encounter (HOSPITAL_BASED_OUTPATIENT_CLINIC_OR_DEPARTMENT_OTHER): Payer: Self-pay | Admitting: Plastic Surgery

## 2022-10-23 ENCOUNTER — Encounter: Payer: Self-pay | Admitting: Plastic Surgery

## 2022-10-25 ENCOUNTER — Ambulatory Visit (INDEPENDENT_AMBULATORY_CARE_PROVIDER_SITE_OTHER): Payer: Commercial Managed Care - HMO | Admitting: Surgical

## 2022-10-25 DIAGNOSIS — Z9011 Acquired absence of right breast and nipple: Secondary | ICD-10-CM

## 2022-10-25 NOTE — Progress Notes (Signed)
Patient is a 57 year old female here for evaluation of her right breast.  She has a history of bilateral breast reconstruction, she subsequently underwent a right latissimus myocutaneous muscle flap and has a tissue expander in place at this time.  She is currently scheduled for removal of the bilateral breast expanders and placement of bilateral breast implants with Dr. Ulice Bold on 10/28/2022 -3 days for now.  She called the on-call provider service this weekend and spoke with me, she reported she noticed drainage from her right breast as well as some fat/tissue protruding through the opening where drainage was coming from.  She reports she is feeling well.  She is not having any infectious symptoms.  She is here today with her husband.  On exam right latissimus myocutaneous muscle flap in place, good color and capillary refill noted.  She does have what appears to be fat herniating through her previous right JP drain insertion site.  She does not have any active drainage at this time.  There is no surrounding erythema or cellulitic changes, however there is some hyperpigmentation noted.  Right breast is slightly firm as expected due to tissue expander in place.  She does have significant amount of tenderness medially that she reports radiates into her axilla and down her right arm.  She reports that this is consistent.  A/P:  Recommend Vaseline and gauze to the area and change this daily.  I did use silver nitrate on the herniating fat and discussed with patient the reasoning for this recommendation.  Discussed with patient that Dr. Ulice Bold is aware and we can further evaluate this in the operating room in 3 days.  We discussed that she may need to have this area excised and primarily closed.  Patient was agreeable to this.  We will plan to see the patient in 3 days for her surgery.  She is aware to call us with any questions or concerns.

## 2022-10-25 NOTE — H&P (View-Only) (Signed)
Patient is a 57 year old female here for evaluation of her right breast.  She has a history of bilateral breast reconstruction, she subsequently underwent a right latissimus myocutaneous muscle flap and has a tissue expander in place at this time.  She is currently scheduled for removal of the bilateral breast expanders and placement of bilateral breast implants with Dr. Ulice Bold on 10/28/2022 -3 days for now.  She called the on-call provider service this weekend and spoke with me, she reported she noticed drainage from her right breast as well as some fat/tissue protruding through the opening where drainage was coming from.  She reports she is feeling well.  She is not having any infectious symptoms.  She is here today with her husband.  On exam right latissimus myocutaneous muscle flap in place, good color and capillary refill noted.  She does have what appears to be fat herniating through her previous right JP drain insertion site.  She does not have any active drainage at this time.  There is no surrounding erythema or cellulitic changes, however there is some hyperpigmentation noted.  Right breast is slightly firm as expected due to tissue expander in place.  She does have significant amount of tenderness medially that she reports radiates into her axilla and down her right arm.  She reports that this is consistent.  A/P:  Recommend Vaseline and gauze to the area and change this daily.  I did use silver nitrate on the herniating fat and discussed with patient the reasoning for this recommendation.  Discussed with patient that Dr. Ulice Bold is aware and we can further evaluate this in the operating room in 3 days.  We discussed that she may need to have this area excised and primarily closed.  Patient was agreeable to this.  We will plan to see the patient in 3 days for her surgery.  She is aware to call us with any questions or concerns.

## 2022-10-28 ENCOUNTER — Other Ambulatory Visit: Payer: Self-pay

## 2022-10-28 ENCOUNTER — Encounter (HOSPITAL_BASED_OUTPATIENT_CLINIC_OR_DEPARTMENT_OTHER): Payer: Self-pay | Admitting: Plastic Surgery

## 2022-10-28 ENCOUNTER — Ambulatory Visit (HOSPITAL_BASED_OUTPATIENT_CLINIC_OR_DEPARTMENT_OTHER): Payer: Commercial Managed Care - HMO | Admitting: Anesthesiology

## 2022-10-28 ENCOUNTER — Ambulatory Visit (HOSPITAL_BASED_OUTPATIENT_CLINIC_OR_DEPARTMENT_OTHER)
Admission: RE | Admit: 2022-10-28 | Discharge: 2022-10-28 | Disposition: A | Discharge status: Home / Self Care | Payer: Commercial Managed Care - HMO | Attending: Plastic Surgery | Admitting: Plastic Surgery

## 2022-10-28 ENCOUNTER — Encounter (HOSPITAL_BASED_OUTPATIENT_CLINIC_OR_DEPARTMENT_OTHER)
Admission: RE | Disposition: A | Discharge status: Home / Self Care | Payer: Self-pay | Source: Home / Self Care | Attending: Plastic Surgery

## 2022-10-28 DIAGNOSIS — Z853 Personal history of malignant neoplasm of breast: Secondary | ICD-10-CM | POA: Insufficient documentation

## 2022-10-28 DIAGNOSIS — Z421 Encounter for breast reconstruction following mastectomy: Secondary | ICD-10-CM | POA: Diagnosis not present

## 2022-10-28 DIAGNOSIS — Z9013 Acquired absence of bilateral breasts and nipples: Secondary | ICD-10-CM | POA: Insufficient documentation

## 2022-10-28 DIAGNOSIS — Z08 Encounter for follow-up examination after completed treatment for malignant neoplasm: Secondary | ICD-10-CM | POA: Insufficient documentation

## 2022-10-28 HISTORY — PX: REMOVAL OF TISSUE EXPANDER AND PLACEMENT OF IMPLANT: SHX6457

## 2022-10-28 HISTORY — PX: BREAST ENHANCEMENT SURGERY: SHX7

## 2022-10-28 SURGERY — REMOVAL, TISSUE EXPANDER, BREAST, WITH IMPLANT INSERTION
Anesthesia: General | Site: Breast | Laterality: Bilateral

## 2022-10-28 MED ORDER — CEFAZOLIN SODIUM-DEXTROSE 2-4 GM/100ML-% IV SOLN
2.0000 g | INTRAVENOUS | Status: AC
  Administered 2022-10-28: 2 g via INTRAVENOUS

## 2022-10-28 MED ORDER — CHLORHEXIDINE GLUCONATE CLOTH 2 % EX PADS: 6.0000 | MEDICATED_PAD | Freq: Once | CUTANEOUS | Status: DC

## 2022-10-28 MED ORDER — ONDANSETRON HCL 4 MG/2ML IJ SOLN
INTRAMUSCULAR | Status: AC
  Filled 2022-10-28: qty 2

## 2022-10-28 MED ORDER — FENTANYL CITRATE (PF) 100 MCG/2ML IJ SOLN
INTRAMUSCULAR | Status: AC
  Filled 2022-10-28: qty 2

## 2022-10-28 MED ORDER — EPINEPHRINE PF 1 MG/ML IJ SOLN
INTRAMUSCULAR | Status: AC
  Filled 2022-10-28: qty 1

## 2022-10-28 MED ORDER — LIDOCAINE HCL (CARDIAC) PF 100 MG/5ML IV SOSY
PREFILLED_SYRINGE | INTRAVENOUS | Status: DC | PRN
  Administered 2022-10-28: 40 mg via INTRAVENOUS

## 2022-10-28 MED ORDER — HYDROMORPHONE HCL 1 MG/ML IJ SOLN
INTRAMUSCULAR | Status: AC
  Filled 2022-10-28: qty 0.5

## 2022-10-28 MED ORDER — DIPHENHYDRAMINE HCL 50 MG/ML IJ SOLN
INTRAMUSCULAR | Status: DC | PRN
  Administered 2022-10-28: 12.5 mg via INTRAVENOUS

## 2022-10-28 MED ORDER — DIPHENHYDRAMINE HCL 50 MG/ML IJ SOLN
INTRAMUSCULAR | Status: AC
  Filled 2022-10-28: qty 1

## 2022-10-28 MED ORDER — AMISULPRIDE (ANTIEMETIC) 5 MG/2ML IV SOLN
INTRAVENOUS | Status: AC
  Filled 2022-10-28: qty 4

## 2022-10-28 MED ORDER — FENTANYL CITRATE (PF) 100 MCG/2ML IJ SOLN
INTRAMUSCULAR | Status: DC | PRN
  Administered 2022-10-28: 50 ug via INTRAVENOUS
  Administered 2022-10-28 (×2): 25 ug via INTRAVENOUS

## 2022-10-28 MED ORDER — AMISULPRIDE (ANTIEMETIC) 5 MG/2ML IV SOLN
10.0000 mg | Freq: Once | INTRAVENOUS | Status: AC
  Administered 2022-10-28: 10 mg via INTRAVENOUS

## 2022-10-28 MED ORDER — KETAMINE HCL 10 MG/ML IJ SOLN
INTRAMUSCULAR | Status: DC | PRN
  Administered 2022-10-28: 25 mg via INTRAVENOUS
  Administered 2022-10-28: 10 mg via INTRAVENOUS

## 2022-10-28 MED ORDER — DEXAMETHASONE SODIUM PHOSPHATE 10 MG/ML IJ SOLN
INTRAMUSCULAR | Status: AC
  Filled 2022-10-28: qty 1

## 2022-10-28 MED ORDER — OXYCODONE HCL 5 MG PO TABS
ORAL_TABLET | ORAL | Status: AC
  Filled 2022-10-28: qty 1

## 2022-10-28 MED ORDER — PROPOFOL 10 MG/ML IV BOLUS
INTRAVENOUS | Status: AC
  Filled 2022-10-28: qty 20

## 2022-10-28 MED ORDER — DEXAMETHASONE SODIUM PHOSPHATE 10 MG/ML IJ SOLN
INTRAMUSCULAR | Status: DC | PRN
  Administered 2022-10-28: 5 mg via INTRAVENOUS

## 2022-10-28 MED ORDER — LIDOCAINE-EPINEPHRINE 1 %-1:100000 IJ SOLN
INTRAMUSCULAR | Status: AC
  Filled 2022-10-28: qty 1

## 2022-10-28 MED ORDER — MIDAZOLAM HCL 5 MG/5ML IJ SOLN
INTRAMUSCULAR | Status: DC | PRN
  Administered 2022-10-28 (×2): 1 mg via INTRAVENOUS

## 2022-10-28 MED ORDER — BUPIVACAINE LIPOSOME 1.3 % IJ SUSP
INTRAMUSCULAR | Status: AC
  Filled 2022-10-28: qty 20

## 2022-10-28 MED ORDER — DEXMEDETOMIDINE HCL IN NACL 80 MCG/20ML IV SOLN
INTRAVENOUS | Status: AC
  Filled 2022-10-28: qty 20

## 2022-10-28 MED ORDER — KETAMINE HCL 50 MG/5ML IJ SOSY
PREFILLED_SYRINGE | INTRAMUSCULAR | Status: AC
  Filled 2022-10-28: qty 5

## 2022-10-28 MED ORDER — MIDAZOLAM HCL 2 MG/2ML IJ SOLN
INTRAMUSCULAR | Status: AC
  Filled 2022-10-28: qty 2

## 2022-10-28 MED ORDER — LACTATED RINGERS IV SOLN: INTRAVENOUS | Status: DC

## 2022-10-28 MED ORDER — EPHEDRINE 5 MG/ML INJ
INTRAVENOUS | Status: AC
  Filled 2022-10-28: qty 5

## 2022-10-28 MED ORDER — VASHE WOUND IRRIGATION OPTIME
TOPICAL | Status: DC | PRN
  Administered 2022-10-28 (×2): 34 [oz_av]

## 2022-10-28 MED ORDER — HYDROMORPHONE HCL 1 MG/ML IJ SOLN
0.5000 mg | INTRAMUSCULAR | Status: DC | PRN
  Administered 2022-10-28: 0.5 mg via INTRAVENOUS

## 2022-10-28 MED ORDER — BUPIVACAINE LIPOSOME 1.3 % IJ SUSP
INTRAMUSCULAR | Status: DC | PRN
Start: 2022-10-28 — End: 2022-10-28
  Administered 2022-10-28: 20 mL

## 2022-10-28 MED ORDER — SODIUM CHLORIDE (PF) 0.9 % IJ SOLN
INTRAMUSCULAR | Status: AC
  Filled 2022-10-28: qty 10

## 2022-10-28 MED ORDER — OXYCODONE HCL 5 MG PO TABS
5.0000 mg | ORAL_TABLET | Freq: Once | ORAL | Status: AC
  Administered 2022-10-28: 5 mg via ORAL

## 2022-10-28 MED ORDER — LIDOCAINE 2% (20 MG/ML) 5 ML SYRINGE
INTRAMUSCULAR | Status: AC
  Filled 2022-10-28: qty 5

## 2022-10-28 MED ORDER — PROPOFOL 10 MG/ML IV BOLUS
INTRAVENOUS | Status: DC | PRN
  Administered 2022-10-28: 150 mg via INTRAVENOUS

## 2022-10-28 MED ORDER — ONDANSETRON HCL 4 MG/2ML IJ SOLN
INTRAMUSCULAR | Status: DC | PRN
  Administered 2022-10-28: 4 mg via INTRAVENOUS

## 2022-10-28 MED ORDER — PHENYLEPHRINE 80 MCG/ML (10ML) SYRINGE FOR IV PUSH (FOR BLOOD PRESSURE SUPPORT)
PREFILLED_SYRINGE | INTRAVENOUS | Status: AC
  Filled 2022-10-28: qty 10

## 2022-10-28 MED ORDER — ACETAMINOPHEN 500 MG PO TABS
1000.0000 mg | ORAL_TABLET | Freq: Once | ORAL | Status: AC
  Administered 2022-10-28: 1000 mg via ORAL

## 2022-10-28 MED ORDER — LIDOCAINE HCL (PF) 1 % IJ SOLN
INTRAMUSCULAR | Status: AC
  Filled 2022-10-28: qty 30

## 2022-10-28 MED ORDER — LIDOCAINE-EPINEPHRINE 1 %-1:100000 IJ SOLN
INTRAMUSCULAR | Status: DC | PRN
  Administered 2022-10-28: 15 mL

## 2022-10-28 MED ORDER — BUPIVACAINE HCL (PF) 0.25 % IJ SOLN
INTRAMUSCULAR | Status: AC
  Filled 2022-10-28: qty 30

## 2022-10-28 MED ORDER — FENTANYL CITRATE (PF) 100 MCG/2ML IJ SOLN
25.0000 ug | INTRAMUSCULAR | Status: DC | PRN
  Administered 2022-10-28 (×2): 50 ug via INTRAVENOUS

## 2022-10-28 MED ORDER — ACETAMINOPHEN 500 MG PO TABS
ORAL_TABLET | ORAL | Status: AC
  Filled 2022-10-28: qty 2

## 2022-10-28 SURGICAL SUPPLY — 75 items
ADH SKN CLS APL DERMABOND .7 (GAUZE/BANDAGES/DRESSINGS) ×3
BAG DECANTER FOR FLEXI CONT (MISCELLANEOUS) ×1 IMPLANT
BINDER BREAST LRG (GAUZE/BANDAGES/DRESSINGS) IMPLANT
BINDER BREAST MEDIUM (GAUZE/BANDAGES/DRESSINGS) IMPLANT
BINDER BREAST XLRG (GAUZE/BANDAGES/DRESSINGS) IMPLANT
BINDER BREAST XXLRG (GAUZE/BANDAGES/DRESSINGS) IMPLANT
BIOPATCH RED 1 DISK 7.0 (GAUZE/BANDAGES/DRESSINGS) IMPLANT
BLADE SURG 15 STRL LF DISP TIS (BLADE) ×1 IMPLANT
BLADE SURG 15 STRL SS (BLADE) ×1
CANISTER SUCT 1200ML W/VALVE (MISCELLANEOUS) ×1 IMPLANT
CLEANSER WND VASHE 34 (WOUND CARE) IMPLANT
COVER BACK TABLE 60X90IN (DRAPES) ×1 IMPLANT
COVER MAYO STAND STRL (DRAPES) ×1 IMPLANT
DERMABOND ADVANCED .7 DNX12 (GAUZE/BANDAGES/DRESSINGS) IMPLANT
DRAIN CHANNEL 19F RND (DRAIN) IMPLANT
DRAPE LAPAROSCOPIC ABDOMINAL (DRAPES) ×1 IMPLANT
DRESSING MEPILEX FLEX 4X4 (GAUZE/BANDAGES/DRESSINGS) IMPLANT
DRSG MEPILEX FLEX 4X4 (GAUZE/BANDAGES/DRESSINGS) ×1
DRSG MEPILEX POST OP 4X8 (GAUZE/BANDAGES/DRESSINGS) ×2 IMPLANT
DRSG TEGADERM 2-3/8X2-3/4 SM (GAUZE/BANDAGES/DRESSINGS) IMPLANT
ELECT BLADE 4.0 EZ CLEAN MEGAD (MISCELLANEOUS) ×1
ELECT COATED BLADE 2.86 ST (ELECTRODE) ×1 IMPLANT
ELECT REM PT RETURN 9FT ADLT (ELECTROSURGICAL) ×1
ELECTRODE BLDE 4.0 EZ CLN MEGD (MISCELLANEOUS) ×1 IMPLANT
ELECTRODE REM PT RTRN 9FT ADLT (ELECTROSURGICAL) ×1 IMPLANT
EVACUATOR SILICONE 100CC (DRAIN) IMPLANT
FUNNEL KELLER 2 DISP (MISCELLANEOUS) IMPLANT
GAUZE PAD ABD 8X10 STRL (GAUZE/BANDAGES/DRESSINGS) ×2 IMPLANT
GAUZE SPONGE 4X4 12PLY STRL LF (GAUZE/BANDAGES/DRESSINGS) IMPLANT
GLOVE BIO SURGEON STRL SZ 6.5 (GLOVE) ×2 IMPLANT
GLOVE BIO SURGEON STRL SZ7.5 (GLOVE) ×1 IMPLANT
GOWN STRL REUS W/ TWL LRG LVL3 (GOWN DISPOSABLE) ×3 IMPLANT
GOWN STRL REUS W/TWL LRG LVL3 (GOWN DISPOSABLE) ×3
HEMOSTAT ARISTA ABSORB 3G PWDR (HEMOSTASIS) IMPLANT
IMPL GEL SMOOTH MP 125CC (Breast) IMPLANT
IMPL SILICONE HI PROF 250CC (Breast) IMPLANT
IMPLANT GEL SMOOTH MP 125CC (Breast) ×1 IMPLANT
IMPLANT SILICONE HI PROF 250CC (Breast) ×1 IMPLANT
IV NS 1000ML (IV SOLUTION)
IV NS 1000ML BAXH (IV SOLUTION) IMPLANT
NDL HYPO 25X1 1.5 SAFETY (NEEDLE) ×1 IMPLANT
NDL SAFETY ECLIP 18X1.5 (MISCELLANEOUS) ×1 IMPLANT
NEEDLE HYPO 25X1 1.5 SAFETY (NEEDLE) ×2
PACK BASIN DAY SURGERY FS (CUSTOM PROCEDURE TRAY) ×1 IMPLANT
PENCIL SMOKE EVACUATOR (MISCELLANEOUS) ×1 IMPLANT
PIN SAFETY STERILE (MISCELLANEOUS) IMPLANT
SIZER BREAST REUSE 125CC (SIZER) ×1
SIZER BREAST REUSE 175CC (SIZER) ×1
SIZER BREAST REUSE 250CC (SIZER) ×1
SIZER BRST REUSE 175CC (SIZER) IMPLANT
SIZER BRST REUSE P3.5XHI 125CC (SIZER) IMPLANT
SIZER BRST REUSE P4.3XHI 250CC (SIZER) IMPLANT
SLEEVE SCD COMPRESS KNEE MED (STOCKING) ×1 IMPLANT
SPIKE FLUID TRANSFER (MISCELLANEOUS) IMPLANT
SPONGE T-LAP 18X18 ~~LOC~~+RFID (SPONGE) ×2 IMPLANT
STRIP SUTURE WOUND CLOSURE 1/2 (MISCELLANEOUS) IMPLANT
SUT MNCRL AB 3-0 PS2 18 (SUTURE) IMPLANT
SUT MNCRL AB 4-0 PS2 18 (SUTURE) IMPLANT
SUT MON AB 3-0 SH 27 (SUTURE)
SUT MON AB 3-0 SH27 (SUTURE) IMPLANT
SUT MON AB 5-0 PS2 18 (SUTURE) IMPLANT
SUT PDS 3-0 CT2 (SUTURE) ×4
SUT PDS AB 2-0 CT2 27 (SUTURE) IMPLANT
SUT PDS II 3-0 CT2 27 ABS (SUTURE) IMPLANT
SUT SILK 3 0 PS 1 (SUTURE) IMPLANT
SUT VIC AB 3-0 SH 27 (SUTURE)
SUT VIC AB 3-0 SH 27X BRD (SUTURE) IMPLANT
SUT VIC AB 4-0 PS2 18 (SUTURE) IMPLANT
SYR BULB IRRIG 60ML STRL (SYRINGE) ×1 IMPLANT
SYR CONTROL 10ML LL (SYRINGE) ×1 IMPLANT
TOWEL GREEN STERILE FF (TOWEL DISPOSABLE) ×2 IMPLANT
TRAY DSU PREP LF (CUSTOM PROCEDURE TRAY) ×1 IMPLANT
TUBE CONNECTING 20X1/4 (TUBING) ×1 IMPLANT
UNDERPAD 30X36 HEAVY ABSORB (UNDERPADS AND DIAPERS) ×2 IMPLANT
YANKAUER SUCT BULB TIP NO VENT (SUCTIONS) ×1 IMPLANT

## 2022-10-28 NOTE — Interval H&P Note (Signed)
History and Physical Interval Note:  10/28/2022 7:09 AM  Julie Townsend  has presented today for surgery, with the diagnosis of hx of breast cancer.  The various methods of treatment have been discussed with the patient and family. After consideration of risks, benefits and other options for treatment, the patient has consented to  Procedure(s): REMOVAL OF TISSUE EXPANDER AND PLACEMENT OF IMPLANT, possible repair of tissue defect to right lateral chest wall (Bilateral) as a surgical intervention.  The patient's history has been reviewed, patient examined, no change in status, stable for surgery.  I have reviewed the patient's chart and labs.  Questions were answered to the patient's satisfaction.     [name] Maximilian Tallo

## 2022-10-28 NOTE — Interval H&P Note (Signed)
History and Physical Interval Note:  10/28/2022 7:08 AM  Julie Townsend  has presented today for surgery, with the diagnosis of hx of breast cancer.  The various methods of treatment have been discussed with the patient and family. After consideration of risks, benefits and other options for treatment, the patient has consented to  Procedure(s): REMOVAL OF TISSUE EXPANDER AND PLACEMENT OF IMPLANT, possible repair of tissue defect to right lateral chest wall (Bilateral) as a surgical intervention.  The patient's history has been reviewed, patient examined, no change in status, stable for surgery.  I have reviewed the patient's chart and labs.  Questions were answered to the patient's satisfaction.     [name] Sherrika Weakland

## 2022-10-28 NOTE — Discharge Instructions (Addendum)
INSTRUCTIONS FOR AFTER BREAST SURGERY   You will likely have some questions about what to expect following your operation.  The following information will help you and your family understand what to expect when you are discharged from the hospital.  It is important to follow these guidelines to help ensure a smooth recovery and reduce complication.  Postoperative instructions include information on: diet, wound care, medications and physical activity.  AFTER SURGERY Expect to go home after the procedure.  In some cases, you may need to spend one night in the hospital for observation.  DIET Breast surgery does not require a specific diet.  However, the healthier you eat the better your body will heal. It is important to increasing your protein intake.  This means limiting the foods with sugar and carbohydrates.  Focus on vegetables and some meat.  If you have liposuction during your procedure be sure to drink water.  If your urine is bright yellow, then it is concentrated, and you need to drink more water.  As a general rule after surgery, you should have 8 ounces of water every hour while awake.  If you find you are persistently nauseated or unable to take in liquids let us know.  NO TOBACCO USE or EXPOSURE.  This will slow your healing process and lead to a wound.  WOUND CARE Leave the binder on for 3 days . Use fragrance free soap like Dial, Dove or [place].   After 3 days you can remove the binder to shower. Once dry apply binder or sports bra. If you have liposuction you will have a soft and spongy dressing (Lipofoam) that helps prevent creases in your skin.  Remove before you shower and then replace it.  It is also available on [employer]. If you have steri-strips / tape directly attached to your skin leave them in place. It is OK to get these wet.   No baths, pools or hot tubs for four weeks. We close your incision to leave the smallest and best-looking scar. No ointment or creams on your incisions  for four weeks.  No Neosporin (Too many skin reactions).  A few weeks after surgery you can use Mederma and start massaging the scar. We ask you to wear your binder or sports bra for the first 6 weeks around the clock, including while sleeping. This provides added comfort and helps reduce the fluid accumulation at the surgery site. NO Ice or heating pads to the operative site.  You have a very high risk of a BURN before you feel the temperature change.  ACTIVITY No heavy lifting until cleared by the doctor.  This usually means no more than a half-gallon of milk.  It is OK to walk and climb stairs. Moving your legs is very important to decrease your risk of a blood clot.  It will also help keep you from getting deconditioned.  Every 1 to 2 hours get up and walk for 5 minutes. This will help with a quicker recovery back to normal.  Let pain be your guide so you don't do too much.  This time is for you to recover.  You will be more comfortable if you sleep and rest with your head elevated either with a few pillows under you or in a recliner.  No stomach sleeping for a three months.  WORK Everyone returns to work at different times. As a rough guide, most people take at least 1 - 2 weeks off prior to returning to work. If  you need documentation for your job, give the forms to the front staff at the clinic.  DRIVING Arrange for someone to bring you home from the hospital after your surgery.  You may be able to drive a few days after surgery but not while taking any narcotics or valium.  BOWEL MOVEMENTS Constipation can occur after anesthesia and while taking pain medication.  It is important to stay ahead for your comfort.  We recommend taking Milk of Magnesia (2 tablespoons; twice a day) while taking the pain pills.  MEDICATIONS You may be prescribed should start after surgery At your preoperative visit for you history and physical you may have been given the following medications: An antibiotic: Start  this medication when you get home and take according to the instructions on the bottle. Zofran 4 mg:  This is to treat nausea and vomiting.  You can take this every 6 hours as needed and only if needed. Valium 2 mg for breast cancer patients: This is for muscle tightness if you have an implant or expander. This will help relax your muscle which also helps with pain control.  This can be taken every 12 hours as needed. Don't drive after taking this medication. Norco (hydrocodone/acetaminophen) 5/325 mg:  This is only to be used after you have taken the Motrin or the Tylenol. Every 8 hours as needed.   Over the counter Medication to take: Ibuprofen (Motrin) 600 mg:  Take this every 6 hours.  If you have additional pain then take 500 mg of the Tylenol every 8 hours.  Only take the Norco after you have tried these two. MiraLAX or Milk of Magnesia: Take this according to the bottle if you take the Norco.  WHEN TO CALL Call your surgeon's office if any of the following occur: Fever 101 degrees F or greater Excessive bleeding or fluid from the incision site. Pain that increases over time without aid from the medications Redness, warmth, or pus draining from incision sites Persistent nausea or inability to take in liquids Severe misshapen area that underwent the operation.   No Tylenol before 1:30pm if needed.  Post Anesthesia Home Care Instructions  Activity: Get plenty of rest for the remainder of the day. A responsible individual must stay with you for 24 hours following the procedure.  For the next 24 hours, DO NOT: -Drive a car -Advertising copywriter -Drink alcoholic beverages -Take any medication unless instructed by your physician -Make any legal decisions or sign important papers.  Meals: Start with liquid foods such as gelatin or soup. Progress to regular foods as tolerated. Avoid greasy, spicy, heavy foods. If nausea and/or vomiting occur, drink only clear liquids until the nausea  and/or vomiting subsides. Call your physician if vomiting continues.  Special Instructions/Symptoms: Your throat may feel dry or sore from the anesthesia or the breathing tube placed in your throat during surgery. If this causes discomfort, gargle with warm salt water. The discomfort should disappear within 24 hours.

## 2022-10-28 NOTE — Anesthesia Postprocedure Evaluation (Signed)
Anesthesia Post Note  Patient: Marianna I Perez-Valentine  Procedure(s) Performed: REMOVAL OF TISSUE EXPANDER AND PLACEMENT OF IMPLANT,  repair of tissue defect to right lateral chest wall (Bilateral: Breast)     Patient location during evaluation: PACU Anesthesia Type: General Level of consciousness: awake and alert Pain management: pain level controlled Vital Signs Assessment: post-procedure vital signs reviewed and stable Respiratory status: spontaneous breathing, nonlabored ventilation, respiratory function stable and patient connected to nasal cannula oxygen Cardiovascular status: blood pressure returned to baseline and stable Postop Assessment: no apparent nausea or vomiting Anesthetic complications: no  No notable events documented.  Last Vitals:  Vitals:   10/28/22 1052 10/28/22 1145  BP: (!) 139/96 (!) 142/96  Pulse: 73 75  Resp: 18 18  Temp:  36.7 C  SpO2: 98% 97%    Last Pain:  Vitals:   10/28/22 1145  TempSrc:   PainSc: 3                  Elohim Brune L Bethani Brugger

## 2022-10-28 NOTE — Anesthesia Procedure Notes (Signed)
Procedure Name: Intubation Date/Time: 10/28/2022 7:47 AM  Performed by: Garth Bigness, CRNAPre-anesthesia Checklist: Patient identified, Emergency Drugs available, Suction available and Patient being monitored Patient Re-evaluated:Patient Re-evaluated prior to induction Oxygen Delivery Method: Circle system utilized Preoxygenation: Pre-oxygenation with 100% oxygen Induction Type: IV induction Ventilation: Mask ventilation without difficulty LMA: LMA inserted LMA Size: 4.0 Tube type: Oral Number of attempts: 1 Placement Confirmation: positive ETCO2 and breath sounds checked- equal and bilateral Tube secured with: Tape Dental Injury: Teeth and Oropharynx as per pre-operative assessment

## 2022-10-28 NOTE — Transfer of Care (Signed)
Immediate Anesthesia Transfer of Care Note  Patient: Julie Townsend  Procedure(s) Performed: REMOVAL OF TISSUE EXPANDER AND PLACEMENT OF IMPLANT,  repair of tissue defect to right lateral chest wall (Bilateral: Breast)  Patient Location: PACU  Anesthesia Type:General  Level of Consciousness: awake, oriented, drowsy, and patient cooperative  Airway & Oxygen Therapy: Patient Spontanous Breathing and Patient connected to face mask oxygen  Post-op Assessment: Report given to RN and Post -op Vital signs reviewed and stable  Post vital signs: Reviewed and stable  Last Vitals:  Vitals Value Taken Time  BP 144/93 10/28/22 0945  Temp    Pulse 72 10/28/22 0951  Resp 19 10/28/22 0951  SpO2 100 % 10/28/22 0951  Vitals shown include unfiled device data.  Last Pain:  Vitals:   10/28/22 0625  TempSrc: Temporal  PainSc: 7       Patients Stated Pain Goal: 2 (10/28/22 1610)  Complications: No notable events documented.

## 2022-10-28 NOTE — Op Note (Signed)
Op report Bilateral Exchange   DATE OF OPERATION: 10/28/2022  LOCATION: [facility]  SURGICAL DIVISION: Plastic Surgery  PREOPERATIVE DIAGNOSIS:  1.History of left breast cancer.  2. Acquired absence of bilateral breast.   POSTOPERATIVE DIAGNOSIS:  1. History of left breast cancer.  2. Acquired absence of bilateral breast.   PROCEDURE:  1. Bilateral exchange of tissue expanders for implants.  2. Bilateral capsulotomies for implant respositioning.  SURGEON: Foster Simpson, DO  ASSISTANT: Caroline More, PA  ANESTHESIA:  General.   COMPLICATIONS: None.   IMPLANTS: Left - Mentor Smooth Round  High Profile Gel 250 cc. Ref #[id number].  Serial Number [id number] Right - Mentor Smooth Round High Profile Gel 125 cc. Ref #[id number].  Serial Number [id number]   INDICATIONS FOR PROCEDURE:  The patient, Julie Townsend, is a 57 y.o. female born on February 28, 1965, is here for treatment after bilateral mastectomies.  She had tissue expanders placed at the time of mastectomies. She now presents for exchange of her expanders for implants.  She requires capsulotomies to better position the implants. MRN: [id number]  CONSENT:  Informed consent was obtained directly from the patient. Risks, benefits and alternatives were fully discussed. Specific risks including but not limited to bleeding, infection, hematoma, seroma, scarring, pain, implant infection, implant extrusion, capsular contracture, asymmetry, wound healing problems, and need for further surgery were all discussed. The patient did have an ample opportunity to have her questions answered to her satisfaction.   DESCRIPTION OF PROCEDURE:  The patient was taken to the operating room. SCDs were placed and IV antibiotics were given. The patient's chest was prepped and draped in a sterile fashion. A time out was performed and the implants to be used were identified.    On the right breast: Local with epinephrine  was used to infiltrate at the incision site. The inferior aspect of the latissimus flap was utilized.  The #10 blade was used to make the incision.  The bovie was to dissect down to the chest wall.  The muscle was split and the expander was removed.    Inspection of the pocket showed a normal healthy capsule.  The pocket was irrigated with vashe.Circumferential capsulotomies were performed to allow for breast pocket expansion.  Measurements were made and a sizer used to confirm adequate pocket size for the implant dimensions.  Arista and Experel were placed. Hemostasis was ensured with electrocautery. New gloves were placed. Liposuction was done medially and laterally. The implant was soaked in vashe and then placed in the pocket and oriented appropriately. The medial incision was excised 2 x 30 mm for excision of the scar and better re approximation of the tissue.  This was closed with the 4-0 Monocryl. The muscle was sutured closed and to the inframammary fold using 3-0 PDS suture. The remaining skin was closed with 3-0 Monocryl deep dermal and 4-0 Monocryl subcuticular stitches.   On the left breast: The old mastectomy scar was incised.  The mastectomy flaps from the superior and inferior flaps were raised over the pectoralis major muscle and ADM for several centimeters to minimize tension for the closure. The pectoralis was split inferior to the skin incision to expose and remove the tissue expander.  Inspection of the pocket showed a normal healthy capsule and good integration of the biologic matrix. Circumferential capsulotomies were performed to allow for breast pocket expansion.  It was very tight at the superior lateral aspect. Measurements were made and a sizer utilized to confirm adequate pocket size for  the implant dimensions. Arista and Experel were placed in the pocket.  Hemostasis was ensured with the electrocautery.  New gloves were applied. The implant was soaked in antibiotic solution and placed  in the pocket and oriented appropriately. The pectoralis major muscle and capsule on the anterior surface were re-closed with a 3-0 PDS suture. The remaining skin was closed with 3-0 Monocryl deep dermal and 4-0 Monocryl subcuticular stitches.  Dermabond was applied to the incision site. A breast binder and ABDs were placed.  The patient was allowed to wake from anesthesia and taken to the recovery room in satisfactory condition.   The advanced practice practitioner (APP) assisted throughout the case.  The APP was essential in retraction and counter traction when needed to make the case progress smoothly.  This retraction and assistance made it possible to see the tissue plans for the procedure.  The assistance was needed for blood control, tissue re-approximation and assisted with closure of the incision site.

## 2022-10-28 NOTE — Anesthesia Preprocedure Evaluation (Addendum)
Anesthesia Evaluation  Patient identified by MRN, date of birth, ID band Patient awake    Reviewed: Allergy & Precautions, NPO status , Patient's Chart, lab work & pertinent test results  Airway Mallampati: I  TM Distance: >3 FB Neck ROM: Full    Dental no notable dental hx.    Pulmonary neg pulmonary ROS   Pulmonary exam normal breath sounds clear to auscultation       Cardiovascular negative cardio ROS Normal cardiovascular exam Rhythm:Regular Rate:Normal     Neuro/Psych  Headaches  negative psych ROS   GI/Hepatic negative GI ROS, Neg liver ROS,,,  Endo/Other  negative endocrine ROS    Renal/GU negative Renal ROS  negative genitourinary   Musculoskeletal  (+) Arthritis ,    Abdominal   Peds  Hematology negative hematology ROS (+)   Anesthesia Other Findings   Reproductive/Obstetrics                             Anesthesia Physical Anesthesia Plan  ASA: 2  Anesthesia Plan: General   Post-op Pain Management: Ofirmev IV (intra-op)*   Induction: Intravenous  PONV Risk Score and Plan: 3 and Ondansetron, Dexamethasone and Midazolam  Airway Management Planned: LMA and Oral ETT  Additional Equipment:   Intra-op Plan:   Post-operative Plan: Extubation in OR  Informed Consent: I have reviewed the patients History and Physical, chart, labs and discussed the procedure including the risks, benefits and alternatives for the proposed anesthesia with the patient or authorized representative who has indicated his/her understanding and acceptance.     Dental advisory given  Plan Discussed with: CRNA  Anesthesia Plan Comments:        Anesthesia Quick Evaluation

## 2022-10-29 ENCOUNTER — Encounter (HOSPITAL_BASED_OUTPATIENT_CLINIC_OR_DEPARTMENT_OTHER): Payer: Self-pay | Admitting: Plastic Surgery

## 2022-11-01 ENCOUNTER — Other Ambulatory Visit: Payer: Self-pay | Admitting: *Deleted

## 2022-11-01 ENCOUNTER — Telehealth: Payer: Self-pay | Admitting: Gastroenterology

## 2022-11-01 DIAGNOSIS — Z800 Family history of malignant neoplasm of digestive organs: Secondary | ICD-10-CM

## 2022-11-01 DIAGNOSIS — Z1501 Genetic susceptibility to malignant neoplasm of breast: Secondary | ICD-10-CM

## 2022-11-01 NOTE — Telephone Encounter (Signed)
MRCP scheduled at [facility] on Monday, 11/08/22 at 7 am, 645 am arrival. NPO 4 hours prior.    Patient has been advised of this information and verbalizes understanding.

## 2022-11-01 NOTE — Telephone Encounter (Signed)
Patient calling in regards to scheduling for a mri. Please advise.

## 2022-11-05 ENCOUNTER — Other Ambulatory Visit: Payer: Self-pay | Admitting: Surgical

## 2022-11-05 ENCOUNTER — Ambulatory Visit (INDEPENDENT_AMBULATORY_CARE_PROVIDER_SITE_OTHER): Payer: Commercial Managed Care - HMO | Admitting: Plastic Surgery

## 2022-11-05 ENCOUNTER — Encounter: Payer: Self-pay | Admitting: Plastic Surgery

## 2022-11-05 DIAGNOSIS — Z1501 Genetic susceptibility to malignant neoplasm of breast: Secondary | ICD-10-CM

## 2022-11-05 DIAGNOSIS — C50512 Malignant neoplasm of lower-outer quadrant of left female breast: Secondary | ICD-10-CM

## 2022-11-05 DIAGNOSIS — Z9011 Acquired absence of right breast and nipple: Secondary | ICD-10-CM

## 2022-11-05 DIAGNOSIS — Z9889 Other specified postprocedural states: Secondary | ICD-10-CM

## 2022-11-05 MED ORDER — DOXYCYCLINE HYCLATE 100 MG PO TABS: 100.0000 mg | ORAL_TABLET | Freq: Two times a day (BID) | ORAL | 0 refills | Status: AC

## 2022-11-05 NOTE — Progress Notes (Signed)
Subjective:    Patient ID: Julie Townsend, female    DOB: [birth year], 57 y.o.   MRN: [id number]  The patient is a 57 year old female here with her husband for follow-up after undergoing removal of bilateral expanders and placement of implants.  This was done on September 26.  She has 250 cc in the left breast implant and 125 cc in the right breast implant.  Today the left side looks great.  The right side is a little red and irritated at the bottom flap area.  It does not look like she has any hematoma or seroma.      Review of Systems  Constitutional:  Positive for activity change.  Eyes: Negative.   Respiratory: Negative.    Cardiovascular: Negative.   Endocrine: Negative.        Objective:   Physical Exam Constitutional:      Appearance: Normal appearance.  Pulmonary:     Effort: Pulmonary effort is normal.  Musculoskeletal:        General: Swelling and tenderness present.  Skin:    General: Skin is warm.     Capillary Refill: Capillary refill takes less than 2 seconds.     Findings: Bruising present.  Neurological:     Mental Status: She is alert and oriented to person, place, and time.  Psychiatric:        Mood and Affect: Mood normal.        Behavior: Behavior normal.        Thought Content: Thought content normal.        Judgment: Judgment normal.        Assessment & Plan:     ICD-10-CM   1. Acquired absence of right breast  Z90.11     2. Malignant neoplasm of lower-outer quadrant of left breast of female, estrogen receptor negative (HCC)  C50.512    Z17.1     3. BRCA2 gene mutation positive  Z15.01    Z15.09       Pictures were obtained of the patient and placed in the chart with the patient's or guardian's permission.  Will send an antibiotic to be on the safe side and I would like her to be seen next week.  The right breast is a little concerning.  If it flares up anymore we could do ultrasound with guided drain in radiology.

## 2022-11-05 NOTE — Progress Notes (Signed)
Resent rx

## 2022-11-08 ENCOUNTER — Ambulatory Visit (HOSPITAL_COMMUNITY)
Admission: RE | Admit: 2022-11-08 | Discharge: 2022-11-08 | Disposition: A | Discharge status: Home / Self Care | Payer: Commercial Managed Care - HMO | Source: Ambulatory Visit | Attending: Gastroenterology | Admitting: Gastroenterology

## 2022-11-08 ENCOUNTER — Other Ambulatory Visit: Payer: Self-pay | Admitting: Gastroenterology

## 2022-11-08 DIAGNOSIS — Z860100 Personal history of colon polyps, unspecified: Secondary | ICD-10-CM

## 2022-11-08 DIAGNOSIS — Z1509 Genetic susceptibility to other malignant neoplasm: Secondary | ICD-10-CM | POA: Insufficient documentation

## 2022-11-08 DIAGNOSIS — Z800 Family history of malignant neoplasm of digestive organs: Secondary | ICD-10-CM

## 2022-11-08 DIAGNOSIS — Z1501 Genetic susceptibility to malignant neoplasm of breast: Secondary | ICD-10-CM | POA: Insufficient documentation

## 2022-11-08 MED ORDER — GADOBUTROL 1 MMOL/ML IV SOLN
5.0000 mL | Freq: Once | INTRAVENOUS | Status: AC | PRN
  Administered 2022-11-08: 5 mL via INTRAVENOUS

## 2022-11-09 ENCOUNTER — Encounter: Payer: Commercial Managed Care - HMO | Admitting: Student

## 2022-11-16 ENCOUNTER — Ambulatory Visit (INDEPENDENT_AMBULATORY_CARE_PROVIDER_SITE_OTHER): Payer: Managed Care, Other (non HMO) | Admitting: Student

## 2022-11-16 ENCOUNTER — Encounter: Payer: Self-pay | Admitting: Student

## 2022-11-16 VITALS — BP 122/82 | HR 77 | Ht 62.0 in | Wt 117.0 lb

## 2022-11-16 DIAGNOSIS — Z9889 Other specified postprocedural states: Secondary | ICD-10-CM

## 2022-11-16 NOTE — Progress Notes (Signed)
Patient is a 57 year old female with history of left breast cancer status post implant-based reconstruction.  She most recently underwent bilateral exchange of tissue expanders for implants and bilateral capsulotomies for implant repositioning with Dr. Ulice Bold on 10/28/2022.  Intraoperatively, patient had a Mentor smooth round high-profile gel 250 cc implant placed on the left, and a Mentor smooth round high-profile gel 125 cc implant placed on the right.  Patient is almost 3 weeks postop.  She presents to the clinic today for postoperative follow-up.  Patient was last seen in the clinic on 11/05/2022.  At this visit, the left side looked great.  The right side was a little red and irritated at the bottom flap area.  It did not look like she had any hematoma or seroma.  Patient was placed on an antibiotic and was told to follow-up in 1 week.  The right breast was a little concerning.  If it flared up anymore, should possibly consider ultrasound with guided drain placement by radiology.  Today, patient presents with her husband at bedside.  She states that she is doing well.  She states that her husband reports the redness has gotten a little bit better since her previous visit.  She states she took her last dose of doxycycline today.  She states there is a little bit of pain on the right side, and a little bit of firmness as well.  She denies any fevers or chills.  Denies any issues with the left side.  She states that she has a little bit bothered by some excess tissue noted laterally on the right side.  She states that it hits her arm and it bothers her.  Chaperone present on exam.  On exam, patient is sitting upright in no acute distress.  Left breast is soft with implant in place.  Incision is healing well.  There is no overlying erythema.  No fluid collections on exam.  To the right breast, skin paddle appears to be healthy.  Implant is in place and there is no overlying erythema, appears to be much  improved from previous exam.  There are no fluid collections on exam.  Steri-Strips are in place over the incision.  No drainage noted on exam.  Some mild tenderness to palpation.  There is a little bit of firmness noted to the inferior/lateral aspect of the breast.  I believe this is consistent with some scar tissue.  There is also a little bit of excess tissue noted to the right lateral aspect of the breast.  Instructed patient to gently massage areas of firmness to her right breast.  Discussed with her to closely monitor her breasts and call if she has any concerns.  Patient expressed understanding.  Discussed with patient to continue compression and avoid vigorous activities.  Discussed with patient that Steri-Strips will fall off on their own, but she may trim them as needed.  Also discussed with patient that she should give the right breast a little bit more time to settle out.  Discussed with her that she may talk with Dr. Ulice Bold about excess skin removal later on.  Discussed with her that this procedure if they were to happen would not take place for several months until patient is fully healed from the surgery.  Patient expressed understanding.  Patient to follow back up in 2 weeks.  Instructed patient to call in the meantime she has any questions or concerns about anything.  Dr. Ulice Bold also had the opportunity to examine the patient  today and was in agreement with the plan.

## 2022-11-23 ENCOUNTER — Encounter: Payer: Self-pay | Admitting: Gastroenterology

## 2022-11-23 ENCOUNTER — Ambulatory Visit (AMBULATORY_SURGERY_CENTER): Payer: Managed Care, Other (non HMO) | Admitting: Gastroenterology

## 2022-11-23 VITALS — BP 134/78 | HR 67 | Temp 97.8°F | Resp 15 | Ht 62.0 in | Wt 117.0 lb

## 2022-11-23 DIAGNOSIS — Z860100 Personal history of colon polyps, unspecified: Secondary | ICD-10-CM

## 2022-11-23 DIAGNOSIS — Z09 Encounter for follow-up examination after completed treatment for conditions other than malignant neoplasm: Secondary | ICD-10-CM | POA: Diagnosis present

## 2022-11-23 MED ORDER — SODIUM CHLORIDE 0.9 % IV SOLN: 500.0000 mL | INTRAVENOUS | Status: DC

## 2022-11-23 MED ORDER — ONDANSETRON HCL 4 MG PO TABS
4.0000 mg | ORAL_TABLET | Freq: Once | ORAL | Status: AC
  Administered 2022-11-23: 4 mg via ORAL

## 2022-11-23 NOTE — Progress Notes (Signed)
A/O x 3, gd SR's, VSS, report to RN

## 2022-11-23 NOTE — Progress Notes (Signed)
GASTROENTEROLOGY PROCEDURE H&P NOTE   Primary Care Physician: Pcp, No    Reason for Procedure:  Colon polyp surveillance  Plan:    Colonoscopy  Patient is appropriate for endoscopic procedure(s) in the ambulatory ([facility]) setting.  The nature of the procedure, as well as the risks, benefits, and alternatives were carefully and thoroughly reviewed with the patient. Ample time for discussion and questions allowed. The patient understood, was satisfied, and agreed to proceed.     HPI: Julie Townsend is a 57 y.o. female who presents for colonoscopy for ongoing polyp surveillance. Last colonoscopy was ~2018 at outside facility and notable for polyps with recommendation to repeat in 5 years. Otherwise, no recent GI sxs.    -09/26/2021: Genetic testing: Pathogenic variant in BRCA2 at p.U9323* (c.3922G>T).    Fhx: Maternal uncle with Pancreatic CA, deceased age 66.  Maternal uncle with Colon and Prostate CA.  Mother with Breast CA Maternal GM with ovarian CA    Past Medical History:  Diagnosis Date   BRCA2 gene mutation positive 09/26/2021   Breast cancer (HCC) 09/2021   left breast IDC   Family history of breast cancer 09/22/2021   Family history of colon cancer    Family history of ovarian cancer 09/22/2021   Family history of pancreatic cancer 09/22/2021   Family history of prostate cancer    Genetic testing 09/26/2021   Pathogenic variant in BRCA2 at p.F5732* (c.3922G>T).  Report date is September 26, 2021.    The BRCAplus panel offered by [employer] and includes sequencing and deletion/duplication analysis for the following 8 genes: ATM, BRCA1, BRCA2, CDH1, CHEK2, PALB2, PTEN, and TP53.  Results of pan-cancer panel are pending.     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    correct Hands   Port-A-Cath in place 03/10/2022    Past Surgical History:  Procedure Laterality Date   BREAST ENHANCEMENT SURGERY Bilateral 10/28/2022   BREAST  IMPLANT REMOVAL Right 12/17/2021   Procedure: Right breast expander removal with washout;  Surgeon: Peggye Form, DO;  Location: [facility];  Service: Plastics;  Laterality: Right;   BREAST RECONSTRUCTION WITH PLACEMENT OF TISSUE EXPANDER AND FLEX HD (ACELLULAR HYDRATED DERMIS) Bilateral 10/29/2021   Procedure: BREAST RECONSTRUCTION WITH PLACEMENT OF TISSUE EXPANDER AND FLEX HD (ACELLULAR HYDRATED DERMIS);  Surgeon: Peggye Form, DO;  Location: [facility];  Service: Plastics;  Laterality: Bilateral;   BREAST RECONSTRUCTION WITH PLACEMENT OF TISSUE EXPANDER AND FLEX HD (ACELLULAR HYDRATED DERMIS) Right 07/26/2022   Procedure: PLACEMENT OF TISSUE EXPANDER;  Surgeon: Peggye Form, DO;  Location: [facility];  Service: Plastics;  Laterality: Right;   CARPAL TUNNEL RELEASE Right 06/20/2020   Procedure: CARPAL TUNNEL RELEASE;  Surgeon: Betha Loa, MD;  Location: [facility];  Service: Orthopedics;  Laterality: Right;   DILATION AND CURETTAGE OF UTERUS     IRRIGATION AND DEBRIDEMENT OF WOUND WITH SPLIT THICKNESS SKIN GRAFT Right 03/15/2022   Procedure: Debridement of right breast;  Surgeon: Peggye Form, DO;  Location: [facility];  Service: Plastics;  Laterality: Right;   LATISSIMUS FLAP TO BREAST Right 07/26/2022   Procedure: Right breast reconstruction and latissimus muscle flap;  Surgeon: Peggye Form, DO;  Location: [facility];  Service: Plastics;  Laterality: Right;   MASTECTOMY     MASTECTOMY W/ SENTINEL NODE BIOPSY Left 10/29/2021   Procedure: LEFT MASTECTOMY WITH LEFT SENTINEL LYMPH NODE BIOPSY;  Surgeon: Abigail Miyamoto, MD;  Location: [facility];  Service: General;  Laterality: Left;   PORT-A-CATH REMOVAL N/A 07/26/2022   Procedure: PORT-A-CATH REMOVAL;  Surgeon: Abigail Miyamoto, MD;  Location: [facility] OR;  Service: General;  Laterality: N/A;   PORTACATH PLACEMENT Right 10/29/2021   Procedure: INSERTION  PORT-A-CATH;  Surgeon: Abigail Miyamoto, MD;  Location: [facility];  Service: General;  Laterality: Right;   REMOVAL OF TISSUE EXPANDER AND PLACEMENT OF IMPLANT Bilateral 10/28/2022   Procedure: REMOVAL OF TISSUE EXPANDER AND PLACEMENT OF IMPLANT,  repair of tissue defect to right lateral chest wall;  Surgeon: Peggye Form, DO;  Location: [facility];  Service: Plastics;  Laterality: Bilateral;   ROBOTIC ASSISTED SALPINGO OOPHERECTOMY Bilateral 09/15/2022   Procedure: XI ROBOTIC ASSISTED SALPINGO OOPHORECTOMY;  Surgeon: Carver Fila, MD;  Location: [facility];  Service: Gynecology;  Laterality: Bilateral;   TOTAL MASTECTOMY Right 10/29/2021   Procedure: RIGHT TOTAL MASTECTOMY;  Surgeon: Abigail Miyamoto, MD;  Location: [facility];  Service: General;  Laterality: Right;   TRIGGER FINGER RELEASE Left 05/08/2020   Procedure: LEFT LONG FINGER TRIGGER RELEASE AND LEFT RING FINGER TRIGGER RELEASE;  Surgeon: Betha Loa, MD;  Location: [facility];  Service: Orthopedics;  Laterality: Left;   trigger finger release rt hand      Prior to Admission medications   Medication Sig Start Date End Date Taking? Authorizing Provider  estradiol (ESTRACE) 0.1 MG/GM vaginal cream Place 4 g vaginally 2 (two) times a week. 08/21/22  Yes [provider]  EPINEPHrine 0.3 mg/0.3 mL IJ SOAJ injection Inject 0.3 mg into the muscle as needed for anaphylaxis. 07/15/22   Jene Every, MD  Galcanezumab-gnlm [facility]) 120 MG/ML SOAJ Inject 120 mg into the skin every 28 (twenty-eight) days. 09/13/22   Drema Dallas, DO  ibuprofen (ADVIL) 200 MG tablet Take 600 mg by mouth every 6 (six) hours as needed for moderate pain or headache.    [provider]  ondansetron (ZOFRAN) 4 MG tablet Take 1 tablet (4 mg total) by mouth every 8 (eight) hours as needed for up to 20 doses for nausea or vomiting. Patient not taking: Reported on 11/23/2022 10/12/22    Laurena Spies, PA-C  oxyCODONE (ROXICODONE) 5 MG immediate release tablet Take 1 tablet (5 mg total) by mouth every 6 (six) hours as needed for up to 20 doses for severe pain. 10/12/22   Laurena Spies, PA-C  senna-docusate (SENOKOT-S) 8.6-50 MG tablet Take 2 tablets by mouth at bedtime. For AFTER surgery, do not take if having diarrhea 09/03/22   Warner Mccreedy D, NP  SUMAtriptan (IMITREX) 50 MG tablet SMARTSIG:1 Tablet(s) By Mouth Patient not taking: Reported on 11/23/2022 10/03/22   [provider]  Ubrogepant (UBRELVY) 100 MG TABS TAKE 1 TABLET AT THE EARLIST ONSET OF A MIGRAINE. MAY REPEAT IN 2 HOURS. MAX 2/24 HOURS. 09/13/22   Drema Dallas, DO    Current Outpatient Medications  Medication Sig Dispense Refill   estradiol (ESTRACE) 0.1 MG/GM vaginal cream Place 4 g vaginally 2 (two) times a week.     EPINEPHrine 0.3 mg/0.3 mL IJ SOAJ injection Inject 0.3 mg into the muscle as needed for anaphylaxis. 1 each 1   Galcanezumab-gnlm (EMGALITY) 120 MG/ML SOAJ Inject 120 mg into the skin every 28 (twenty-eight) days. 1 mL 11   ibuprofen (ADVIL) 200 MG tablet Take 600 mg by mouth every 6 (six) hours as needed for moderate pain or headache.  ondansetron (ZOFRAN) 4 MG tablet Take 1 tablet (4 mg total) by mouth every 8 (eight) hours as needed for up to 20 doses for nausea or vomiting. (Patient not taking: Reported on 11/23/2022) 20 tablet 0   oxyCODONE (ROXICODONE) 5 MG immediate release tablet Take 1 tablet (5 mg total) by mouth every 6 (six) hours as needed for up to 20 doses for severe pain. 20 tablet 0   senna-docusate (SENOKOT-S) 8.6-50 MG tablet Take 2 tablets by mouth at bedtime. For AFTER surgery, do not take if having diarrhea 30 tablet 0   SUMAtriptan (IMITREX) 50 MG tablet SMARTSIG:1 Tablet(s) By Mouth (Patient not taking: Reported on 11/23/2022)     Ubrogepant (UBRELVY) 100 MG TABS TAKE 1 TABLET AT THE EARLIST ONSET OF A MIGRAINE. MAY REPEAT IN 2 HOURS. MAX 2/24 HOURS. 16  tablet 11   Current Facility-Administered Medications  Medication Dose Route Frequency Provider Last Rate Last Admin   0.9 %  sodium chloride infusion  500 mL Intravenous Continuous Lebron Nauert V, DO        Allergies as of 11/23/2022 - Review Complete 11/23/2022  Allergen Reaction Noted   Bee venom Anaphylaxis 08/16/2022   Gadavist [gadobutrol] Nausea And Vomiting 11/08/2022    Family History  Problem Relation Age of Onset   Migraines Mother    Breast cancer Mother 34       recurr at  19   Migraines Maternal Aunt    Colon cancer Maternal Uncle        dx late 60s   Pancreatic cancer Maternal Uncle 28   Migraines Maternal Grandmother    Ovarian cancer Maternal Grandmother        dx 18s    Social History   Socioeconomic History   Marital status: Married    Spouse name: Not on file   Number of children: 2   Years of education: Not on file   Highest education level: Not on file  Occupational History   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   Right handed   Social Determinants of Health   Financial Resource Strain: Not on file  Food Insecurity: Not on file  Transportation Needs: Not on file  Physical Activity: Not on file  Stress: Not on file  Social Connections: Not on file  Intimate Partner Violence: Not on file    Physical Exam: Vital signs in last 24 hours: @BP  128/83   Pulse 78   Temp 97.8 F (36.6 C)   Ht 5\' 2"  (1.575 m)   Wt 117 lb (53.1 kg)   SpO2 98%   BMI 21.40 kg/m  GEN: NAD EYE: Sclerae anicteric ENT: MMM CV: Non-tachycardic Pulm: CTA b/l GI: Soft,  NT/ND NEURO:  Alert & Oriented x 3   Doristine Locks, DO [facility]   11/23/2022 9:14 AM

## 2022-11-23 NOTE — Op Note (Signed)
[facility] Patient Name: Julie Townsend Procedure Date: 11/23/2022 9:18 AM MRN: [id number] Endoscopist: Doristine Locks , MD, [id number] Age: 57 Referring MD:  Date of Birth: Feb 07, 1965 Gender: Female Account #: [id number] Procedure:                Colonoscopy Indications:              Surveillance: Personal history of colonic polyps                            (unknown histology) on last colonoscopy more than 5                            years ago                           Last colonoscopy was ~2018 at outside facility and                            notable for polyps (histology, size, number,                            location unkown) with recommendation to repeat in 5                            years. Otherwise, no GI symptoms. Medicines:                Monitored Anesthesia Care Procedure:                Pre-Anesthesia Assessment:                           - Prior to the procedure, a History and Physical                            was performed, and patient medications and                            allergies were reviewed. The patient's tolerance of                            previous anesthesia was also reviewed. The risks                            and benefits of the procedure and the sedation                            options and risks were discussed with the patient.                            All questions were answered, and informed consent                            was obtained. Prior Anticoagulants: The patient has  taken no anticoagulant or antiplatelet agents. ASA                            Grade Assessment: II - A patient with mild systemic                            disease. After reviewing the risks and benefits,                            the patient was deemed in satisfactory condition to                            undergo the procedure.                           After obtaining informed consent, the colonoscope                             was passed under direct vision. Throughout the                            procedure, the patient's blood pressure, pulse, and                            oxygen saturations were monitored continuously. The                            Olympus Scope SN [id number] was introduced through the                            anus and advanced to the the cecum, identified by                            appendiceal orifice and ileocecal valve. The                            colonoscopy was performed without difficulty. The                            patient tolerated the procedure well. The quality                            of the bowel preparation was good. The ileocecal                            valve, appendiceal orifice, and rectum were                            photographed. Scope In: 9:23:02 AM Scope Out: 9:36:46 AM Scope Withdrawal Time: 0 hours 8 minutes 43 seconds  Total Procedure Duration: 0 hours 13 minutes 44 seconds  Findings:                 The perianal and digital rectal examinations were  normal.                           The entire colon appeared normal.                           The retroflexed view of the distal rectum and anal                            verge was normal and showed no anal or rectal                            abnormalities. Complications:            No immediate complications. Estimated Blood Loss:     Estimated blood loss: none. Impression:               - The entire examined colon is normal.                           - The distal rectum and anal verge are normal on                            retroflexion view.                           - No specimens collected. Recommendation:           - Patient has a contact number available for                            emergencies. The signs and symptoms of potential                            delayed complications were discussed with the                            patient. Return  to normal activities tomorrow.                            Written discharge instructions were provided to the                            patient.                           - Resume previous diet.                           - Continue present medications.                           - Repeat colonoscopy in 10 years for screening                            purposes.                           -  Return to GI office PRN. Doristine Locks, MD 11/23/2022 9:42:19 AM

## 2022-11-23 NOTE — Patient Instructions (Addendum)
REPEAT Colonoscopy in 10 years for screening purposes.  YOU HAD AN ENDOSCOPIC PROCEDURE TODAY AT THE [facility]:   Refer to the procedure report that was given to you for any specific questions about what was found during the examination.  If the procedure report does not answer your questions, please call your gastroenterologist to clarify.  If you requested that your care partner not be given the details of your procedure findings, then the procedure report has been included in a sealed envelope for you to review at your convenience later.  YOU SHOULD EXPECT: Some feelings of bloating in the abdomen. Passage of more gas than usual.  Walking can help get rid of the air that was put into your GI tract during the procedure and reduce the bloating. If you had a lower endoscopy (such as a colonoscopy or flexible sigmoidoscopy) you may notice spotting of blood in your stool or on the toilet paper. If you underwent a bowel prep for your procedure, you may not have a normal bowel movement for a few days.  Please Note:  You might notice some irritation and congestion in your nose or some drainage.  This is from the oxygen used during your procedure.  There is no need for concern and it should clear up in a day or so.  SYMPTOMS TO REPORT IMMEDIATELY:  Following lower endoscopy (colonoscopy or flexible sigmoidoscopy):  Excessive amounts of blood in the stool  Significant tenderness or worsening of abdominal pains  Swelling of the abdomen that is new, acute  Fever of 100F or higher  For urgent or emergent issues, a gastroenterologist can be reached at any hour by calling [phone]. Do not use MyChart messaging for urgent concerns.    DIET:  We do recommend a small meal at first, but then you may proceed to your regular diet.  Drink plenty of fluids but you should avoid alcoholic beverages for 24 hours.  ACTIVITY:  You should plan to take it easy for the rest of today and you should  NOT DRIVE or use heavy machinery until tomorrow (because of the sedation medicines used during the test).    FOLLOW UP: Our staff will call the number listed on your records the next business day following your procedure.  We will call around 7:15- 8:00 am to check on you and address any questions or concerns that you may have regarding the information given to you following your procedure. If we do not reach you, we will leave a message.     If any biopsies were taken you will be contacted by phone or by letter within the next 1-3 weeks.  Please call us at [phone] if you have not heard about the biopsies in 3 weeks.    SIGNATURES/CONFIDENTIALITY: You and/or your care partner have signed paperwork which will be entered into your electronic medical record.  These signatures attest to the fact that that the information above on your After Visit Summary has been reviewed and is understood.  Full responsibility of the confidentiality of this discharge information lies with you and/or your care-partner.

## 2022-11-23 NOTE — Progress Notes (Signed)
Pt's states no medical or surgical changes since previsit or office visit. 

## 2022-11-24 ENCOUNTER — Telehealth: Payer: Self-pay

## 2022-11-24 NOTE — Telephone Encounter (Signed)
  Follow up Call-     11/23/2022    8:56 AM  Call back number  Post procedure Call Back phone  # [phone]  Permission to leave phone message Yes     Patient questions:  Do you have a fever, pain , or abdominal swelling? No. Pain Score  0 *  Have you tolerated food without any problems? Yes.    Have you been able to return to your normal activities? Yes.    Do you have any questions about your discharge instructions: Diet   No. Medications  No. Follow up visit  No.  Do you have questions or concerns about your Care? No.  Actions: * If pain score is 4 or above: No action needed, pain <4.

## 2022-11-30 ENCOUNTER — Ambulatory Visit (INDEPENDENT_AMBULATORY_CARE_PROVIDER_SITE_OTHER): Payer: Managed Care, Other (non HMO) | Admitting: Student

## 2022-11-30 DIAGNOSIS — Z9889 Other specified postprocedural states: Secondary | ICD-10-CM

## 2022-11-30 NOTE — Progress Notes (Signed)
Patient is a 57 year old female with history of left breast cancer status post implant-based reconstruction.  She most recently underwent bilateral exchange of tissue expanders for implants and bilateral capsulotomies for implant repositioning with Dr. Ulice Bold on 10/28/2022.  Intraoperatively, patient had a Mentor smooth round high-profile gel 250 cc implant placed on the left, and a Mentor smooth round high-profile gel 125 cc implant placed on the right.  Patient is almost 5 weeks postop.  She presents to the clinic today for postoperative follow-up.     Patient was last seen in the clinic on 11/16/2022.  At this visit, patient was doing well.  On exam, her left breast was soft with implant in place.  Incision was healing well.  To the right breast, the skin paddle appeared to be healthy, implant was in place.  There is no overlying erythema.  Steri-Strips are in place over the incision.  There was a little bit of firmness noted to the inferior/lateral aspect of the right breast.  Patient was encouraged to massage the areas of firmness and closely monitor her breasts.  Today, patient presents with her husband at bedside.  Patient states that she still has a little bit of pain at the inferior aspect of her right breast.  She states that she still having little bit of tightness bilaterally.  She reports her range of motion is somewhat limited on the right side, but improving.  She states that she is still bothered by the shape of her right breast and some of the excess tissue.  She states she is also having some pain at the port site.  Denies any other issues or concerns.  Chaperone present on exam.  On exam, patient is sitting upright in no acute distress.  Left breast is soft, implant in place.  There is no overlying erythema.  No fluid collections on exam.  Incision appears to be intact and healing well.  Right breast is overall soft.  There is a little bit of firmness noted to the inferior/lateral aspect  of the breast, consistent with some scar tissue.  Some mild tenderness to palpation to the inferior aspect of the breast.  No overlying skin changes.  No signs of infection on exam.  No fluid collections palpated on exam.  Skin paddle appears to be healthy.  It does not appear there are any acute abnormalities with patient's port site.  Recommended physical therapy for the patient given tightness and limited range of motion.  Patient was in agreement with this.  Discussed with patient that she may start utilizing scar creams to her incisions.  We discussed Mederma, Silagen, Skinuva and silicone strips.  Also discussed with patient she should continue to massage the areas of firmness.  Patient expressed understanding.  Discussed with patient that when she is 6 weeks out from surgery, she may start gradually increasing her activities.  Recommended that she start low and go slow.  Patient expressed understanding.  Discussed with patient that she should follow-up with Dr. Ulice Bold in regards to the areas that she is bothered by.  Will have her see Dr. Ulice Bold in a month.  Instructed patient to call in the meantime she has any questions or concerns about anything.  Pictures were obtained of the patient and placed in the chart with the patient's or guardian's permission.

## 2022-12-01 NOTE — Therapy (Signed)
OUTPATIENT PHYSICAL THERAPY  UPPER EXTREMITY ONCOLOGY EVALUATION  Patient Name: Julie Townsend MRN: [id number] DOB:Aug 31, 1965, 57 y.o., female Today's Date: 12/02/2022  END OF SESSION:  PT End of Session - 12/02/22 1151     Visit Number 1    Number of Visits 13    Date for PT Re-Evaluation 01/13/23    PT Start Time 1100    PT Stop Time 1150    PT Time Calculation (min) 50 min    Activity Tolerance Patient tolerated treatment well    Behavior During Therapy [facility] for tasks assessed/performed             Past Medical History:  Diagnosis Date   BRCA2 gene mutation positive 09/26/2021   Breast cancer (HCC) 09/2021   left breast IDC   Family history of breast cancer 09/22/2021   Family history of colon cancer    Family history of ovarian cancer 09/22/2021   Family history of pancreatic cancer 09/22/2021   Family history of prostate cancer    Genetic testing 09/26/2021   Pathogenic variant in BRCA2 at p.G2952* (c.3922G>T).  Report date is September 26, 2021.    The BRCAplus panel offered by [employer] and includes sequencing and deletion/duplication analysis for the following 8 genes: ATM, BRCA1, BRCA2, CDH1, CHEK2, PALB2, PTEN, and TP53.  Results of pan-cancer panel are pending.     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    correct Hands   Port-A-Cath in place 03/10/2022   Past Surgical History:  Procedure Laterality Date   BREAST ENHANCEMENT SURGERY Bilateral 10/28/2022   BREAST IMPLANT REMOVAL Right 12/17/2021   Procedure: Right breast expander removal with washout;  Surgeon: Peggye Form, DO;  Location: [facility];  Service: Plastics;  Laterality: Right;   BREAST RECONSTRUCTION WITH PLACEMENT OF TISSUE EXPANDER AND FLEX HD (ACELLULAR HYDRATED DERMIS) Bilateral 10/29/2021   Procedure: BREAST RECONSTRUCTION WITH PLACEMENT OF TISSUE EXPANDER AND FLEX HD (ACELLULAR HYDRATED DERMIS);  Surgeon: Peggye Form,  DO;  Location: [facility];  Service: Plastics;  Laterality: Bilateral;   BREAST RECONSTRUCTION WITH PLACEMENT OF TISSUE EXPANDER AND FLEX HD (ACELLULAR HYDRATED DERMIS) Right 07/26/2022   Procedure: PLACEMENT OF TISSUE EXPANDER;  Surgeon: Peggye Form, DO;  Location: [facility];  Service: Plastics;  Laterality: Right;   CARPAL TUNNEL RELEASE Right 06/20/2020   Procedure: CARPAL TUNNEL RELEASE;  Surgeon: Betha Loa, MD;  Location: [facility];  Service: Orthopedics;  Laterality: Right;   DILATION AND CURETTAGE OF UTERUS     IRRIGATION AND DEBRIDEMENT OF WOUND WITH SPLIT THICKNESS SKIN GRAFT Right 03/15/2022   Procedure: Debridement of right breast;  Surgeon: Peggye Form, DO;  Location: [facility];  Service: Plastics;  Laterality: Right;   LATISSIMUS FLAP TO BREAST Right 07/26/2022   Procedure: Right breast reconstruction and latissimus muscle flap;  Surgeon: Peggye Form, DO;  Location: [facility];  Service: Plastics;  Laterality: Right;   MASTECTOMY     MASTECTOMY W/ SENTINEL NODE BIOPSY Left 10/29/2021   Procedure: LEFT MASTECTOMY WITH LEFT SENTINEL LYMPH NODE BIOPSY;  Surgeon: Abigail Miyamoto, MD;  Location: [facility];  Service: General;  Laterality: Left;   PORT-A-CATH REMOVAL N/A 07/26/2022   Procedure: PORT-A-CATH REMOVAL;  Surgeon: Abigail Miyamoto, MD;  Location: [facility];  Service: General;  Laterality: N/A;   PORTACATH PLACEMENT Right 10/29/2021   Procedure: INSERTION PORT-A-CATH;  Surgeon: Abigail Miyamoto, MD;  Location: [facility];  Service: General;  Laterality: Right;   REMOVAL OF TISSUE EXPANDER AND PLACEMENT OF IMPLANT Bilateral 10/28/2022   Procedure: REMOVAL OF TISSUE EXPANDER AND PLACEMENT OF IMPLANT,  repair of tissue defect to right lateral chest wall;  Surgeon: Peggye Form, DO;  Location: [facility];  Service: Plastics;  Laterality: Bilateral;   ROBOTIC ASSISTED SALPINGO  OOPHERECTOMY Bilateral 09/15/2022   Procedure: XI ROBOTIC ASSISTED SALPINGO OOPHORECTOMY;  Surgeon: Carver Fila, MD;  Location: [facility];  Service: Gynecology;  Laterality: Bilateral;   TOTAL MASTECTOMY Right 10/29/2021   Procedure: RIGHT TOTAL MASTECTOMY;  Surgeon: Abigail Miyamoto, MD;  Location: [facility];  Service: General;  Laterality: Right;   TRIGGER FINGER RELEASE Left 05/08/2020   Procedure: LEFT LONG FINGER TRIGGER RELEASE AND LEFT RING FINGER TRIGGER RELEASE;  Surgeon: Betha Loa, MD;  Location: [facility];  Service: Orthopedics;  Laterality: Left;   trigger finger release rt hand     Patient Active Problem List   Diagnosis Date Noted   Acquired absence of right breast 07/26/2022   Cellulitis of right breast 03/10/2022   Medication management 03/10/2022   Breast cancer (HCC) 10/29/2021   BRCA2 gene mutation positive 09/26/2021   Genetic testing 09/26/2021   Family history of breast cancer 09/22/2021   Family history of pancreatic cancer 09/22/2021   Family history of ovarian cancer 09/22/2021   Malignant neoplasm of lower-outer quadrant of left breast of female, estrogen receptor negative (HCC) 09/11/2021    PCP: None  REFERRING PROVIDER: Caroline More PA-C  REFERRING DIAG:  Diagnosis  Z98.890 (ICD-10-CM) - S/P breast reconstruction    THERAPY DIAG:  Malignant neoplasm of lower-outer quadrant of left breast of female, estrogen receptor negative (HCC)  Aftercare following surgery for neoplasm  Breast pain, right  Stiffness of right shoulder, not elsewhere classified  ONSET DATE: 10/29/21  Rationale for Evaluation and Treatment: Rehabilitation  SUBJECTIVE:                                                                                                                                                                                           SUBJECTIVE STATEMENT: I have a little bit of loss of ROM on the Rt side from the lat  flap.  They also said I have some scar tissue on the Rt side.  In the last week or so I have had more pain in the armpit.  The Left side feels okay just sensitive.  They still watch that side for a staph infection every month.    PERTINENT HISTORY: She most recently underwent bilateral exchange of tissue expanders for  implants and bilateral capsulotomies for implant repositioning with Dr. Ulice Bold on 10/28/2022. Lt breast cancer 2023 with bilateral mastectomy 10/29/21 with 5 negative nodes. 12/17/21 - removal of Rt expander with washout. 07/26/22 - Rt lat flap and reconstruction with another expander. The Right side has a hx of 3 staph infections.   Completed chemo with ACT.  No radiation.    PAIN:  Are you having pain? Yes NPRS scale: 8/10 Pain location: the Rt inferior breast and can hurt in the axilla with reaching and pulling  Pain orientation: Right  PAIN TYPE: dull and sharp Pain description: constant  Aggravating factors: sometimes compression Relieving factors: nothing makes the breast feel better, they think it is maybe scar tissue.   PRECAUTIONS: Lt lymphedema risk    RED FLAGS: None   WEIGHT BEARING RESTRICTIONS: No  FALLS:  Has patient fallen in last 6 months? No  LIVING ENVIRONMENT: Lives with: lives with their family, lives with their spouse, and lives with their daughter  OCCUPATION: work at home.  It will start to become painful at my desk.  I have to learn how to take more breaks.    LEISURE: gardening, crocheting, I am not supposed to lift more than 4# until 12/09/22 for activity restrictions, I normally lift at home - up to 25#.    HAND DOMINANCE: right   PRIOR LEVEL OF FUNCTION: Independent  PATIENT GOALS: get back to normal.     OBJECTIVE: Note: Objective measures were completed at Evaluation unless otherwise noted.  COGNITION: Overall cognitive status: Within functional limits for tasks assessed   PALPATION: Very tender to light palpation Rt breast,  trunk, chest wall and around lat incision.  Scar tissue thicker Rt lateral breast   OBSERVATIONS / OTHER ASSESSMENTS: well healed incisions, increased scar tissue Rt breast  POSTURE: WNL  UPPER EXTREMITY AROM/PROM:  A/PROM RIGHT   eval   Shoulder extension 60  Shoulder flexion 140 - pull  Shoulder abduction 125 - pect pull  Shoulder internal rotation   Shoulder external rotation 100    (Blank rows = not tested)  A/PROM LEFT   eval  Shoulder extension 60  Shoulder flexion 165  Shoulder abduction 165  Shoulder internal rotation   Shoulder external rotation 105    (Blank rows = not tested)  CERVICAL AROM: All within normal limits:   UPPER EXTREMITY STRENGTH:   L-DEX LYMPHEDEMA SCREENING: The patient was assessed using the L-Dex machine today to produce a lymphedema index baseline score. The patient will be reassessed on a regular basis (typically every 3 months) to obtain new L-Dex scores. If the score is > 6.5 points away from his/her baseline score indicating onset of subclinical lymphedema, it will be recommended to wear a compression garment for 4 weeks, 12 hours per day and then be reassessed. If the score continues to be > 6.5 points from baseline at reassessment, we will initiate lymphedema treatment. Assessing in this manner has a 95% rate of preventing clinically significant lymphedema.  QUICK DASH SURVEY: 22% limited from 18% baseline    TODAY'S TREATMENT:  DATE: 12/02/22 Eval performed Discussed de-sensitization principles and signals to the brain and how to perform touch with different textures and pressure and in different directions.   Education on initial HEP per below with handout given.   PATIENT EDUCATION:  Education details: per today's note Person educated: Patient Education method: Explanation, Demonstration, Actor  cues, Verbal cues, and Handouts Education comprehension: verbalized understanding  HOME EXERCISE PROGRAM: Access Code: [email or web address] URL: [email or web address] Date: 12/02/2022 Prepared by: Gwenevere Abbot  Exercises - Supine Lower Trunk Rotation  - 1 x daily - 7 x weekly - 1-3 sets - 10 reps - 20-30 seconds hold - Supine Shoulder Flexion Extension AAROM with Dowel  - 1-2 x daily - 7 x weekly - 10 reps - 5 seconds hold - Standing Shoulder Flexion Wall Walk  - 1 x daily - 7 x weekly - 1-3 sets - 10 reps - 3-4 sec hold  ASSESSMENT:  CLINICAL IMPRESSION: Patient is a 57 y.o. female who was seen today for physical therapy evaluation and treatment for continued pain, decreased mobility, and hyper sensitivity after a hx of 3 staph infections, expander washout and exchange, then lat flap and implants on the non cancer side.  The left side is slightly tender in the axilla but pt reports doing well.  Will focus on regaining motion, and improving pain on the Rt side to return to lifting, gardening, etc.     OBJECTIVE IMPAIRMENTS: decreased activity tolerance, decreased mobility, decreased ROM, decreased strength, and increased fascial restrictions.   ACTIVITY LIMITATIONS: carrying and lifting  PARTICIPATION LIMITATIONS: community activity and yard work  PERSONAL FACTORS: surgical hx are also affecting patient's functional outcome.   REHAB POTENTIAL: Good  CLINICAL DECISION MAKING: Stable/uncomplicated  EVALUATION COMPLEXITY: Low  GOALS: Goals reviewed with patient? Yes  SHORT TERM GOALS: Target date: 12/17/22  Pt will improve Rt AROM by at least 15 degrees to demonstrate improved mobility. Baseline: Flex; 150, Abd: 125 on eval  Goal status: INITIAL  2.  Pt will report resting breast pain of 6/10 or less Baseline:  Goal status: INITIAL   LONG TERM GOALS: Target date: 01/16/23  Pt will return to gardening and weight lifting activities without limitations  Baseline:  Goal  status: INITIAL  2.  Pt will improve AROM to equal to the left side to improve mobility  Baseline:  Goal status: INITIAL  3.  Pt will tolerate normal STM pressure to demonstrate less sensitivity.  Baseline:  Goal status: INITIAL   PLAN:  PT FREQUENCY: 2x/week  PT DURATION: 6 weeks  PLANNED INTERVENTIONS: 97164- PT Re-evaluation, 97110-Therapeutic exercises, 97530- Therapeutic activity, 97535- Self Care, 54098- Manual therapy, Patient/Family education, Balance training, Taping, Dry Needling, Joint mobilization, Therapeutic exercises, Therapeutic activity, and Self Care  PLAN FOR NEXT SESSION: reassess Rt ROM and continue AAROM/PROM, attempt Rt trunk STM, MLD/desensitization, cupping, taping  Idamae Lusher, PT 12/02/2022, 11:53 AM

## 2022-12-02 ENCOUNTER — Encounter: Payer: Self-pay | Admitting: Rehabilitation

## 2022-12-02 ENCOUNTER — Other Ambulatory Visit: Payer: Self-pay

## 2022-12-02 ENCOUNTER — Ambulatory Visit: Payer: Managed Care, Other (non HMO) | Attending: Student | Admitting: Rehabilitation

## 2022-12-02 DIAGNOSIS — Z483 Aftercare following surgery for neoplasm: Secondary | ICD-10-CM | POA: Diagnosis present

## 2022-12-02 DIAGNOSIS — M25611 Stiffness of right shoulder, not elsewhere classified: Secondary | ICD-10-CM

## 2022-12-02 DIAGNOSIS — N644 Mastodynia: Secondary | ICD-10-CM | POA: Diagnosis present

## 2022-12-02 DIAGNOSIS — C50512 Malignant neoplasm of lower-outer quadrant of left female breast: Secondary | ICD-10-CM

## 2022-12-02 DIAGNOSIS — Z171 Estrogen receptor negative status [ER-]: Secondary | ICD-10-CM | POA: Insufficient documentation

## 2022-12-02 DIAGNOSIS — Z9889 Other specified postprocedural states: Secondary | ICD-10-CM | POA: Diagnosis not present

## 2022-12-04 ENCOUNTER — Other Ambulatory Visit: Payer: Self-pay | Admitting: Neurology

## 2022-12-08 ENCOUNTER — Encounter: Payer: Self-pay | Admitting: Physical Therapy

## 2022-12-08 ENCOUNTER — Ambulatory Visit: Payer: Commercial Managed Care - HMO | Attending: Student | Admitting: Physical Therapy

## 2022-12-08 DIAGNOSIS — Z483 Aftercare following surgery for neoplasm: Secondary | ICD-10-CM | POA: Diagnosis present

## 2022-12-08 DIAGNOSIS — Z171 Estrogen receptor negative status [ER-]: Secondary | ICD-10-CM | POA: Diagnosis present

## 2022-12-08 DIAGNOSIS — M25611 Stiffness of right shoulder, not elsewhere classified: Secondary | ICD-10-CM | POA: Diagnosis present

## 2022-12-08 DIAGNOSIS — R293 Abnormal posture: Secondary | ICD-10-CM | POA: Diagnosis present

## 2022-12-08 DIAGNOSIS — C50512 Malignant neoplasm of lower-outer quadrant of left female breast: Secondary | ICD-10-CM | POA: Insufficient documentation

## 2022-12-08 DIAGNOSIS — N644 Mastodynia: Secondary | ICD-10-CM | POA: Diagnosis present

## 2022-12-08 NOTE — Therapy (Signed)
OUTPATIENT PHYSICAL THERAPY  UPPER EXTREMITY ONCOLOGY EVALUATION  Patient Name: Julie Townsend MRN: [id number] DOB:1965-04-30, 57 y.o., female Today's Date: 12/08/2022  END OF SESSION:  PT End of Session - 12/08/22 1515     Visit Number 2    Number of Visits 13    Date for PT Re-Evaluation 01/13/23    PT Start Time 1515    PT Stop Time 1558    PT Time Calculation (min) 43 min    Activity Tolerance Patient tolerated treatment well    Behavior During Therapy [facility] for tasks assessed/performed             Past Medical History:  Diagnosis Date   BRCA2 gene mutation positive 09/26/2021   Breast cancer (HCC) 09/2021   left breast IDC   Family history of breast cancer 09/22/2021   Family history of colon cancer    Family history of ovarian cancer 09/22/2021   Family history of pancreatic cancer 09/22/2021   Family history of prostate cancer    Genetic testing 09/26/2021   Pathogenic variant in BRCA2 at p.I4332* (c.3922G>T).  Report date is September 26, 2021.    The BRCAplus panel offered by [employer] and includes sequencing and deletion/duplication analysis for the following 8 genes: ATM, BRCA1, BRCA2, CDH1, CHEK2, PALB2, PTEN, and TP53.  Results of pan-cancer panel are pending.     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    correct Hands   Port-A-Cath in place 03/10/2022   Past Surgical History:  Procedure Laterality Date   BREAST ENHANCEMENT SURGERY Bilateral 10/28/2022   BREAST IMPLANT REMOVAL Right 12/17/2021   Procedure: Right breast expander removal with washout;  Surgeon: Peggye Form, DO;  Location: [facility];  Service: Plastics;  Laterality: Right;   BREAST RECONSTRUCTION WITH PLACEMENT OF TISSUE EXPANDER AND FLEX HD (ACELLULAR HYDRATED DERMIS) Bilateral 10/29/2021   Procedure: BREAST RECONSTRUCTION WITH PLACEMENT OF TISSUE EXPANDER AND FLEX HD (ACELLULAR HYDRATED DERMIS);  Surgeon: Peggye Form, DO;   Location: [facility];  Service: Plastics;  Laterality: Bilateral;   BREAST RECONSTRUCTION WITH PLACEMENT OF TISSUE EXPANDER AND FLEX HD (ACELLULAR HYDRATED DERMIS) Right 07/26/2022   Procedure: PLACEMENT OF TISSUE EXPANDER;  Surgeon: Peggye Form, DO;  Location: [facility];  Service: Plastics;  Laterality: Right;   CARPAL TUNNEL RELEASE Right 06/20/2020   Procedure: CARPAL TUNNEL RELEASE;  Surgeon: Betha Loa, MD;  Location: [facility];  Service: Orthopedics;  Laterality: Right;   DILATION AND CURETTAGE OF UTERUS     IRRIGATION AND DEBRIDEMENT OF WOUND WITH SPLIT THICKNESS SKIN GRAFT Right 03/15/2022   Procedure: Debridement of right breast;  Surgeon: Peggye Form, DO;  Location: [facility];  Service: Plastics;  Laterality: Right;   LATISSIMUS FLAP TO BREAST Right 07/26/2022   Procedure: Right breast reconstruction and latissimus muscle flap;  Surgeon: Peggye Form, DO;  Location: [facility];  Service: Plastics;  Laterality: Right;   MASTECTOMY     MASTECTOMY W/ SENTINEL NODE BIOPSY Left 10/29/2021   Procedure: LEFT MASTECTOMY WITH LEFT SENTINEL LYMPH NODE BIOPSY;  Surgeon: Abigail Miyamoto, MD;  Location: [facility];  Service: General;  Laterality: Left;   PORT-A-CATH REMOVAL N/A 07/26/2022   Procedure: PORT-A-CATH REMOVAL;  Surgeon: Abigail Miyamoto, MD;  Location: [facility];  Service: General;  Laterality: N/A;   PORTACATH PLACEMENT Right 10/29/2021   Procedure: INSERTION PORT-A-CATH;  Surgeon: Abigail Miyamoto, MD;  Location:  SURGERY  [facility];  Service: General;  Laterality: Right;   REMOVAL OF TISSUE EXPANDER AND PLACEMENT OF IMPLANT Bilateral 10/28/2022   Procedure: REMOVAL OF TISSUE EXPANDER AND PLACEMENT OF IMPLANT,  repair of tissue defect to right lateral chest wall;  Surgeon: Peggye Form, DO;  Location: [facility];  Service: Plastics;  Laterality: Bilateral;   ROBOTIC ASSISTED SALPINGO OOPHERECTOMY  Bilateral 09/15/2022   Procedure: XI ROBOTIC ASSISTED SALPINGO OOPHORECTOMY;  Surgeon: Carver Fila, MD;  Location: [facility];  Service: Gynecology;  Laterality: Bilateral;   TOTAL MASTECTOMY Right 10/29/2021   Procedure: RIGHT TOTAL MASTECTOMY;  Surgeon: Abigail Miyamoto, MD;  Location: [facility];  Service: General;  Laterality: Right;   TRIGGER FINGER RELEASE Left 05/08/2020   Procedure: LEFT LONG FINGER TRIGGER RELEASE AND LEFT RING FINGER TRIGGER RELEASE;  Surgeon: Betha Loa, MD;  Location: [facility];  Service: Orthopedics;  Laterality: Left;   trigger finger release rt hand     Patient Active Problem List   Diagnosis Date Noted   Acquired absence of right breast 07/26/2022   Cellulitis of right breast 03/10/2022   Medication management 03/10/2022   Breast cancer (HCC) 10/29/2021   BRCA2 gene mutation positive 09/26/2021   Genetic testing 09/26/2021   Family history of breast cancer 09/22/2021   Family history of pancreatic cancer 09/22/2021   Family history of ovarian cancer 09/22/2021   Malignant neoplasm of lower-outer quadrant of left breast of female, estrogen receptor negative (HCC) 09/11/2021    PCP: None  REFERRING PROVIDER: Caroline More PA-C  REFERRING DIAG:  Diagnosis  Z98.890 (ICD-10-CM) - S/P breast reconstruction    THERAPY DIAG:  Stiffness of right shoulder, not elsewhere classified  Breast pain, right  Aftercare following surgery for neoplasm  Malignant neoplasm of lower-outer quadrant of left breast of female, estrogen receptor negative (HCC)  ONSET DATE: 10/29/21  Rationale for Evaluation and Treatment: Rehabilitation  SUBJECTIVE:                                                                                                                                                                                           SUBJECTIVE STATEMENT: I have been doing the densensitization techniques and they have been  helping.   PERTINENT HISTORY: She most recently underwent bilateral exchange of tissue expanders for implants and bilateral capsulotomies for implant repositioning with Dr. Ulice Bold on 10/28/2022. Lt breast cancer 2023 with bilateral mastectomy 10/29/21 with 5 negative nodes. 12/17/21 - removal of Rt expander with washout. 07/26/22 - Rt lat flap and reconstruction with another expander. The Right side has a hx of 3 staph infections.  Completed chemo with ACT.  No radiation.    PAIN:  Are you having pain? No pt reports it is only painful to touch   PRECAUTIONS: Lt lymphedema risk    RED FLAGS: None   WEIGHT BEARING RESTRICTIONS: No  FALLS:  Has patient fallen in last 6 months? No  LIVING ENVIRONMENT: Lives with: lives with their family, lives with their spouse, and lives with their daughter  OCCUPATION: work at home.  It will start to become painful at my desk.  I have to learn how to take more breaks.    LEISURE: gardening, crocheting, I am not supposed to lift more than 4# until 12/09/22 for activity restrictions, I normally lift at home - up to 25#.    HAND DOMINANCE: right   PRIOR LEVEL OF FUNCTION: Independent  PATIENT GOALS: get back to normal.     OBJECTIVE: Note: Objective measures were completed at Evaluation unless otherwise noted.  COGNITION: Overall cognitive status: Within functional limits for tasks assessed   PALPATION: Very tender to light palpation Rt breast, trunk, chest wall and around lat incision.  Scar tissue thicker Rt lateral breast   OBSERVATIONS / OTHER ASSESSMENTS: well healed incisions, increased scar tissue Rt breast  POSTURE: WNL  UPPER EXTREMITY AROM/PROM:  A/PROM RIGHT   eval  RIGHT 12/08/22  Shoulder extension 60   Shoulder flexion 140 - pull 149 with pull  Shoulder abduction 125 - pect pull 128 with pull  Shoulder internal rotation    Shoulder external rotation 100     (Blank rows = not tested)  A/PROM LEFT   eval  Shoulder  extension 60  Shoulder flexion 165  Shoulder abduction 165  Shoulder internal rotation   Shoulder external rotation 105    (Blank rows = not tested)  CERVICAL AROM: All within normal limits:   UPPER EXTREMITY STRENGTH:   L-DEX LYMPHEDEMA SCREENING: The patient was assessed using the L-Dex machine today to produce a lymphedema index baseline score. The patient will be reassessed on a regular basis (typically every 3 months) to obtain new L-Dex scores. If the score is > 6.5 points away from his/her baseline score indicating onset of subclinical lymphedema, it will be recommended to wear a compression garment for 4 weeks, 12 hours per day and then be reassessed. If the score continues to be > 6.5 points from baseline at reassessment, we will initiate lymphedema treatment. Assessing in this manner has a 95% rate of preventing clinically significant lymphedema.  QUICK DASH SURVEY: 22% limited from 18% baseline    TODAY'S TREATMENT:                                                                                                                                          DATE:  12/08/22: Pulleys x 2 min in direction of flexion and 1 min and 30 sec in direction of abduction with pt  returning therapist demo Ball up wall x 10 reps in direction of flexion and 10 reps in to R abduction with pt returning therapist demo and leaning in for a stretch at end range In supine:  PROM to R shoulder in to end range flexion and abduction MLD to area of fullness at inferior breast moving fluid towards R axilla (nodes all intact) - instructed pt in skin stretch technique and had her return demo Gentle scar mobilization to area of scar tissue just under area of fullness In L s/l: Gentle STM to area surrounding lat flap scar and along scar Gentle suction cupping with cocoa butter gentle moving suction across while constantly monitoring pt's response and pain levels   12/02/22 Eval performed Discussed  de-sensitization principles and signals to the brain and how to perform touch with different textures and pressure and in different directions.   Education on initial HEP per below with handout given.   PATIENT EDUCATION:  Education details: per today's note Person educated: Patient Education method: Explanation, Demonstration, Actor cues, Verbal cues, and Handouts Education comprehension: verbalized understanding  HOME EXERCISE PROGRAM: Access Code: [email or web address] URL: [email or web address] Date: 12/02/2022 Prepared by: Gwenevere Abbot  Exercises - Supine Lower Trunk Rotation  - 1 x daily - 7 x weekly - 1-3 sets - 10 reps - 20-30 seconds hold - Supine Shoulder Flexion Extension AAROM with Dowel  - 1-2 x daily - 7 x weekly - 10 reps - 5 seconds hold - Standing Shoulder Flexion Wall Walk  - 1 x daily - 7 x weekly - 1-3 sets - 10 reps - 3-4 sec hold  ASSESSMENT:  CLINICAL IMPRESSION: Began AAROM exercises to improve R shoulder ROM and decrease tightness. Began STM, MLD, and gentle suction cupping to decrease swelling and sensitivity. Pt was very sensitive initially but did improve some by end of session. Monitored pt's response throughout all manual therapy today.    OBJECTIVE IMPAIRMENTS: decreased activity tolerance, decreased mobility, decreased ROM, decreased strength, and increased fascial restrictions.   ACTIVITY LIMITATIONS: carrying and lifting  PARTICIPATION LIMITATIONS: community activity and yard work  PERSONAL FACTORS: surgical hx are also affecting patient's functional outcome.   REHAB POTENTIAL: Good  CLINICAL DECISION MAKING: Stable/uncomplicated  EVALUATION COMPLEXITY: Low  GOALS: Goals reviewed with patient? Yes  SHORT TERM GOALS: Target date: 12/17/22  Pt will improve Rt AROM by at least 15 degrees to demonstrate improved mobility. Baseline: Flex; 150, Abd: 125 on eval  Goal status: INITIAL  2.  Pt will report resting breast pain of 6/10 or  less Baseline:  Goal status: INITIAL   LONG TERM GOALS: Target date: 01/16/23  Pt will return to gardening and weight lifting activities without limitations  Baseline:  Goal status: INITIAL  2.  Pt will improve AROM to equal to the left side to improve mobility  Baseline:  Goal status: INITIAL  3.  Pt will tolerate normal STM pressure to demonstrate less sensitivity.  Baseline:  Goal status: INITIAL   PLAN:  PT FREQUENCY: 2x/week  PT DURATION: 6 weeks  PLANNED INTERVENTIONS: [id number]- PT Re-evaluation, 97110-Therapeutic exercises, 97530- Therapeutic activity, 97535- Self Care, [id number]- Manual therapy, Patient/Family education, Balance training, Taping, Dry Needling, Joint mobilization, Therapeutic exercises, Therapeutic activity, and Self Care  PLAN FOR NEXT SESSION: reassess Rt ROM and continue AAROM/PROM, Rt trunk STM, MLD/desensitization, cupping, taping  [employer], PT 12/08/2022, 4:03 PM

## 2022-12-09 ENCOUNTER — Encounter: Payer: Self-pay | Admitting: Physical Therapy

## 2022-12-09 ENCOUNTER — Ambulatory Visit: Payer: Commercial Managed Care - HMO | Admitting: Physical Therapy

## 2022-12-09 DIAGNOSIS — C50512 Malignant neoplasm of lower-outer quadrant of left female breast: Secondary | ICD-10-CM

## 2022-12-09 DIAGNOSIS — Z483 Aftercare following surgery for neoplasm: Secondary | ICD-10-CM

## 2022-12-09 DIAGNOSIS — N644 Mastodynia: Secondary | ICD-10-CM

## 2022-12-09 DIAGNOSIS — M25611 Stiffness of right shoulder, not elsewhere classified: Secondary | ICD-10-CM | POA: Diagnosis not present

## 2022-12-09 NOTE — Therapy (Signed)
OUTPATIENT PHYSICAL THERAPY  UPPER EXTREMITY ONCOLOGY TREATMENT  Patient Name: Julie Townsend MRN: [id number] DOB:[birth year], 57 y.o., female Today's Date: 12/09/2022  END OF SESSION:  PT End of Session - 12/09/22 1556     Visit Number 3    Number of Visits 13    Date for PT Re-Evaluation 01/13/23    PT Start Time 1504    PT Stop Time 1555    PT Time Calculation (min) 51 min    Activity Tolerance Patient tolerated treatment well    Behavior During Therapy [facility] for tasks assessed/performed             Past Medical History:  Diagnosis Date   BRCA2 gene mutation positive 09/26/2021   Breast cancer (HCC) 09/2021   left breast IDC   Family history of breast cancer 09/22/2021   Family history of colon cancer    Family history of ovarian cancer 09/22/2021   Family history of pancreatic cancer 09/22/2021   Family history of prostate cancer    Genetic testing 09/26/2021   Pathogenic variant in BRCA2 at p.A2130* (c.3922G>T).  Report date is September 26, 2021.    The BRCAplus panel offered by [employer] and includes sequencing and deletion/duplication analysis for the following 8 genes: ATM, BRCA1, BRCA2, CDH1, CHEK2, PALB2, PTEN, and TP53.  Results of pan-cancer panel are pending.     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    correct Hands   Port-A-Cath in place 03/10/2022   Past Surgical History:  Procedure Laterality Date   BREAST ENHANCEMENT SURGERY Bilateral 10/28/2022   BREAST IMPLANT REMOVAL Right 12/17/2021   Procedure: Right breast expander removal with washout;  Surgeon: Peggye Form, DO;  Location: [facility];  Service: Plastics;  Laterality: Right;   BREAST RECONSTRUCTION WITH PLACEMENT OF TISSUE EXPANDER AND FLEX HD (ACELLULAR HYDRATED DERMIS) Bilateral 10/29/2021   Procedure: BREAST RECONSTRUCTION WITH PLACEMENT OF TISSUE EXPANDER AND FLEX HD (ACELLULAR HYDRATED DERMIS);  Surgeon: Peggye Form, DO;   Location: [facility];  Service: Plastics;  Laterality: Bilateral;   BREAST RECONSTRUCTION WITH PLACEMENT OF TISSUE EXPANDER AND FLEX HD (ACELLULAR HYDRATED DERMIS) Right 07/26/2022   Procedure: PLACEMENT OF TISSUE EXPANDER;  Surgeon: Peggye Form, DO;  Location: [facility];  Service: Plastics;  Laterality: Right;   CARPAL TUNNEL RELEASE Right 06/20/2020   Procedure: CARPAL TUNNEL RELEASE;  Surgeon: Betha Loa, MD;  Location: [facility];  Service: Orthopedics;  Laterality: Right;   DILATION AND CURETTAGE OF UTERUS     IRRIGATION AND DEBRIDEMENT OF WOUND WITH SPLIT THICKNESS SKIN GRAFT Right 03/15/2022   Procedure: Debridement of right breast;  Surgeon: Peggye Form, DO;  Location: [facility];  Service: Plastics;  Laterality: Right;   LATISSIMUS FLAP TO BREAST Right 07/26/2022   Procedure: Right breast reconstruction and latissimus muscle flap;  Surgeon: Peggye Form, DO;  Location: [facility];  Service: Plastics;  Laterality: Right;   MASTECTOMY     MASTECTOMY W/ SENTINEL NODE BIOPSY Left 10/29/2021   Procedure: LEFT MASTECTOMY WITH LEFT SENTINEL LYMPH NODE BIOPSY;  Surgeon: Abigail Miyamoto, MD;  Location: [facility];  Service: General;  Laterality: Left;   PORT-A-CATH REMOVAL N/A 07/26/2022   Procedure: PORT-A-CATH REMOVAL;  Surgeon: Abigail Miyamoto, MD;  Location[facility];  Service: General;  Laterality: N/A;   PORTACATH PLACEMENT Right 10/29/2021   Procedure: INSERTION PORT-A-CATH;  Surgeon: Abigail Miyamoto, MD;  Location: [facility];  Service: General;  Laterality: Right;   REMOVAL OF TISSUE EXPANDER AND PLACEMENT OF IMPLANT Bilateral 10/28/2022   Procedure: REMOVAL OF TISSUE EXPANDER AND PLACEMENT OF IMPLANT,  repair of tissue defect to right lateral chest wall;  Surgeon: Peggye Form, DO;  Location: [facility];  Service: Plastics;  Laterality: Bilateral;   ROBOTIC ASSISTED SALPINGO OOPHERECTOMY  Bilateral 09/15/2022   Procedure: XI ROBOTIC ASSISTED SALPINGO OOPHORECTOMY;  Surgeon: Carver Fila, MD;  Location: [facility];  Service: Gynecology;  Laterality: Bilateral;   TOTAL MASTECTOMY Right 10/29/2021   Procedure: RIGHT TOTAL MASTECTOMY;  Surgeon: Abigail Miyamoto, MD;  Location: [facility];  Service: General;  Laterality: Right;   TRIGGER FINGER RELEASE Left 05/08/2020   Procedure: LEFT LONG FINGER TRIGGER RELEASE AND LEFT RING FINGER TRIGGER RELEASE;  Surgeon: Betha Loa, MD;  Location: [facility];  Service: Orthopedics;  Laterality: Left;   trigger finger release rt hand     Patient Active Problem List   Diagnosis Date Noted   Acquired absence of right breast 07/26/2022   Cellulitis of right breast 03/10/2022   Medication management 03/10/2022   Breast cancer (HCC) 10/29/2021   BRCA2 gene mutation positive 09/26/2021   Genetic testing 09/26/2021   Family history of breast cancer 09/22/2021   Family history of pancreatic cancer 09/22/2021   Family history of ovarian cancer 09/22/2021   Malignant neoplasm of lower-outer quadrant of left breast of female, estrogen receptor negative (HCC) 09/11/2021    PCP: None  REFERRING PROVIDER: Caroline More PA-C  REFERRING DIAG:  Diagnosis  Z98.890 (ICD-10-CM) - S/P breast reconstruction    THERAPY DIAG:  Stiffness of right shoulder, not elsewhere classified  Breast pain, right  Aftercare following surgery for neoplasm  Malignant neoplasm of lower-outer quadrant of left breast of female, estrogen receptor negative (HCC)  ONSET DATE: 10/29/21  Rationale for Evaluation and Treatment: Rehabilitation  SUBJECTIVE:                                                                                                                                                                                           SUBJECTIVE STATEMENT: I was sore after last session but then it felt better.   PERTINENT  HISTORY: She most recently underwent bilateral exchange of tissue expanders for implants and bilateral capsulotomies for implant repositioning with Dr. Ulice Bold on 10/28/2022. Lt breast cancer 2023 with bilateral mastectomy 10/29/21 with 5 negative nodes. 12/17/21 - removal of Rt expander with washout. 07/26/22 - Rt lat flap and reconstruction with another expander. The Right side has a hx of 3 staph infections.   Completed  chemo with ACT.  No radiation.    PAIN:  Are you having pain? No pt reports it is only painful to touch   PRECAUTIONS: Lt lymphedema risk    RED FLAGS: None   WEIGHT BEARING RESTRICTIONS: No  FALLS:  Has patient fallen in last 6 months? No  LIVING ENVIRONMENT: Lives with: lives with their family, lives with their spouse, and lives with their daughter  OCCUPATION: work at home.  It will start to become painful at my desk.  I have to learn how to take more breaks.    LEISURE: gardening, crocheting, I am not supposed to lift more than 4# until 12/09/22 for activity restrictions, I normally lift at home - up to 25#.    HAND DOMINANCE: right   PRIOR LEVEL OF FUNCTION: Independent  PATIENT GOALS: get back to normal.     OBJECTIVE: Note: Objective measures were completed at Evaluation unless otherwise noted.  COGNITION: Overall cognitive status: Within functional limits for tasks assessed   PALPATION: Very tender to light palpation Rt breast, trunk, chest wall and around lat incision.  Scar tissue thicker Rt lateral breast   OBSERVATIONS / OTHER ASSESSMENTS: well healed incisions, increased scar tissue Rt breast  POSTURE: WNL  UPPER EXTREMITY AROM/PROM:  A/PROM RIGHT   eval  RIGHT 12/08/22  Shoulder extension 60   Shoulder flexion 140 - pull 149 with pull  Shoulder abduction 125 - pect pull 128 with pull  Shoulder internal rotation    Shoulder external rotation 100     (Blank rows = not tested)  A/PROM LEFT   eval  Shoulder extension 60  Shoulder  flexion 165  Shoulder abduction 165  Shoulder internal rotation   Shoulder external rotation 105    (Blank rows = not tested)  CERVICAL AROM: All within normal limits:   UPPER EXTREMITY STRENGTH:   L-DEX LYMPHEDEMA SCREENING: The patient was assessed using the L-Dex machine today to produce a lymphedema index baseline score. The patient will be reassessed on a regular basis (typically every 3 months) to obtain new L-Dex scores. If the score is > 6.5 points away from his/her baseline score indicating onset of subclinical lymphedema, it will be recommended to wear a compression garment for 4 weeks, 12 hours per day and then be reassessed. If the score continues to be > 6.5 points from baseline at reassessment, we will initiate lymphedema treatment. Assessing in this manner has a 95% rate of preventing clinically significant lymphedema.  QUICK DASH SURVEY: 22% limited from 18% baseline    TODAY'S TREATMENT:                                                                                                                                          DATE:  12/09/22: Pulleys x 2 min in direction of flexion and abduction with pt returning therapist demo Ball up wall x 10 reps  in direction of flexion and 10 reps in to R abduction with pt returning therapist demo and leaning in for a stretch at end range Supine over 1/2 foam roll with arms outstretched x 2 min, then alternating flexion x 10 reps each with pt unable to touch mat with R UE, open book x 10 reps with pt able to touch mat bilaterally Supine on large mat table: LTR with arms outstretch x 3 reps in each direction with 20 sec holds In supine:  MLD to area of fullness at inferior breast moving fluid towards R axilla (nodes all intact)  Gentle scar mobilization to area of scar tissue just under area of fullness In L s/l: Gentle STM to area surrounding lat flap scar and along scar Gentle suction cupping with cocoa butter gentle moving suction  across while constantly monitoring pt's response and pain levels   12/08/22: Pulleys x 2 min in direction of flexion and 1 min and 30 sec in direction of abduction with pt returning therapist demo Ball up wall x 10 reps in direction of flexion and 10 reps in to R abduction with pt returning therapist demo and leaning in for a stretch at end range In supine:  PROM to R shoulder in to end range flexion and abduction MLD to area of fullness at inferior breast moving fluid towards R axilla (nodes all intact) - instructed pt in skin stretch technique and had her return demo Gentle scar mobilization to area of scar tissue just under area of fullness In L s/l: Gentle STM to area surrounding lat flap scar and along scar Gentle suction cupping with cocoa butter gentle moving suction across while constantly monitoring pt's response and pain levels   12/02/22 Eval performed Discussed de-sensitization principles and signals to the brain and how to perform touch with different textures and pressure and in different directions.   Education on initial HEP per below with handout given.   PATIENT EDUCATION:  Education details: per today's note Person educated: Patient Education method: Explanation, Demonstration, Actor cues, Verbal cues, and Handouts Education comprehension: verbalized understanding  HOME EXERCISE PROGRAM: Access Code: [email or web address] URL: [email or web address] Date: 12/02/2022 Prepared by: Gwenevere Abbot  Exercises - Supine Lower Trunk Rotation  - 1 x daily - 7 x weekly - 1-3 sets - 10 reps - 20-30 seconds hold - Supine Shoulder Flexion Extension AAROM with Dowel  - 1-2 x daily - 7 x weekly - 10 reps - 5 seconds hold - Standing Shoulder Flexion Wall Walk  - 1 x daily - 7 x weekly - 1-3 sets - 10 reps - 3-4 sec hold  ASSESSMENT:  CLINICAL IMPRESSION: Continued AAROM exercises today and added stretches over 1/2 foam roll which pt did very well with a felt a good stretch  across pecs with this. Continued with manual therapy and suction cupping. Pt's sensitivity to touch was improved some from last session and she was able to tolerate more manual therapy than last session but is still sensitive.     OBJECTIVE IMPAIRMENTS: decreased activity tolerance, decreased mobility, decreased ROM, decreased strength, and increased fascial restrictions.   ACTIVITY LIMITATIONS: carrying and lifting  PARTICIPATION LIMITATIONS: community activity and yard work  PERSONAL FACTORS: surgical hx are also affecting patient's functional outcome.   REHAB POTENTIAL: Good  CLINICAL DECISION MAKING: Stable/uncomplicated  EVALUATION COMPLEXITY: Low  GOALS: Goals reviewed with patient? Yes  SHORT TERM GOALS: Target date: 12/17/22  Pt will improve Rt AROM by at least 15 degrees to demonstrate improved  mobility. Baseline: Flex; 150, Abd: 125 on eval  Goal status: INITIAL  2.  Pt will report resting breast pain of 6/10 or less Baseline:  Goal status: INITIAL   LONG TERM GOALS: Target date: 01/16/23  Pt will return to gardening and weight lifting activities without limitations  Baseline:  Goal status: INITIAL  2.  Pt will improve AROM to equal to the left side to improve mobility  Baseline:  Goal status: INITIAL  3.  Pt will tolerate normal STM pressure to demonstrate less sensitivity.  Baseline:  Goal status: INITIAL   PLAN:  PT FREQUENCY: 2x/week  PT DURATION: 6 weeks  PLANNED INTERVENTIONS: [id number]- PT Re-evaluation, 97110-Therapeutic exercises, 97530- Therapeutic activity, 97535- Self Care, [id number]- Manual therapy, Patient/Family education, Balance training, Taping, Dry Needling, Joint mobilization, Therapeutic exercises, Therapeutic activity, and Self Care  PLAN FOR NEXT SESSION: reassess Rt ROM and continue AAROM/PROM, Rt trunk STM, MLD/desensitization, cupping, taping  [employer], PT 12/09/2022, 4:01 PM

## 2022-12-10 ENCOUNTER — Telehealth: Payer: Self-pay

## 2022-12-10 LAB — SIGNATERA
SIGNATERA MTM READOUT: 0 MTM/ml
SIGNATERA TEST RESULT: NEGATIVE

## 2022-12-10 NOTE — Telephone Encounter (Signed)
Called pt per MD to advise [employer] testing was negative/not detected. Pt verbalized understanding of results and knows [facility] will be in touch to schedule 3-6 mo repeat lab.

## 2022-12-13 ENCOUNTER — Ambulatory Visit: Payer: Commercial Managed Care - HMO | Admitting: Rehabilitation

## 2022-12-13 DIAGNOSIS — M25611 Stiffness of right shoulder, not elsewhere classified: Secondary | ICD-10-CM | POA: Diagnosis not present

## 2022-12-13 DIAGNOSIS — Z483 Aftercare following surgery for neoplasm: Secondary | ICD-10-CM

## 2022-12-13 DIAGNOSIS — N644 Mastodynia: Secondary | ICD-10-CM

## 2022-12-13 NOTE — Therapy (Signed)
OUTPATIENT PHYSICAL THERAPY  UPPER EXTREMITY ONCOLOGY TREATMENT  Patient Name: Julie Townsend MRN: [id number] DOB:Jun 19, 1965, 57 y.o., female Today's Date: 12/13/2022  END OF SESSION:  PT End of Session - 12/13/22 2105     Visit Number 4    Number of Visits 13    Date for PT Re-Evaluation 01/13/23    PT Start Time 1609    PT Stop Time 1659    PT Time Calculation (min) 50 min    Activity Tolerance Patient tolerated treatment well    Behavior During Therapy [facility] for tasks assessed/performed              Past Medical History:  Diagnosis Date   BRCA2 gene mutation positive 09/26/2021   Breast cancer (HCC) 09/2021   left breast IDC   Family history of breast cancer 09/22/2021   Family history of colon cancer    Family history of ovarian cancer 09/22/2021   Family history of pancreatic cancer 09/22/2021   Family history of prostate cancer    Genetic testing 09/26/2021   Pathogenic variant in BRCA2 at p.N5621* (c.3922G>T).  Report date is September 26, 2021.    The BRCAplus panel offered by [employer] and includes sequencing and deletion/duplication analysis for the following 8 genes: ATM, BRCA1, BRCA2, CDH1, CHEK2, PALB2, PTEN, and TP53.  Results of pan-cancer panel are pending.     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    correct Hands   Port-A-Cath in place 03/10/2022   Past Surgical History:  Procedure Laterality Date   BREAST ENHANCEMENT SURGERY Bilateral 10/28/2022   BREAST IMPLANT REMOVAL Right 12/17/2021   Procedure: Right breast expander removal with washout;  Surgeon: Peggye Form, DO;  Location: [facility];  Service: Plastics;  Laterality: Right;   BREAST RECONSTRUCTION WITH PLACEMENT OF TISSUE EXPANDER AND FLEX HD (ACELLULAR HYDRATED DERMIS) Bilateral 10/29/2021   Procedure: BREAST RECONSTRUCTION WITH PLACEMENT OF TISSUE EXPANDER AND FLEX HD (ACELLULAR HYDRATED DERMIS);  Surgeon: Peggye Form,  DO;  Location: [facility];  Service: Plastics;  Laterality: Bilateral;   BREAST RECONSTRUCTION WITH PLACEMENT OF TISSUE EXPANDER AND FLEX HD (ACELLULAR HYDRATED DERMIS) Right 07/26/2022   Procedure: PLACEMENT OF TISSUE EXPANDER;  Surgeon: Peggye Form, DO;  Location: [facility];  Service: Plastics;  Laterality: Right;   CARPAL TUNNEL RELEASE Right 06/20/2020   Procedure: CARPAL TUNNEL RELEASE;  Surgeon: Betha Loa, MD;  Location: [facility];  Service: Orthopedics;  Laterality: Right;   DILATION AND CURETTAGE OF UTERUS     IRRIGATION AND DEBRIDEMENT OF WOUND WITH SPLIT THICKNESS SKIN GRAFT Right 03/15/2022   Procedure: Debridement of right breast;  Surgeon: Peggye Form, DO;  Location: [facility];  Service: Plastics;  Laterality: Right;   LATISSIMUS FLAP TO BREAST Right 07/26/2022   Procedure: Right breast reconstruction and latissimus muscle flap;  Surgeon: Peggye Form, DO;  Location: [facility];  Service: Plastics;  Laterality: Right;   MASTECTOMY     MASTECTOMY W/ SENTINEL NODE BIOPSY Left 10/29/2021   Procedure: LEFT MASTECTOMY WITH LEFT SENTINEL LYMPH NODE BIOPSY;  Surgeon: Abigail Miyamoto, MD;  Location: [facility];  Service: General;  Laterality: Left;   PORT-A-CATH REMOVAL N/A 07/26/2022   Procedure: PORT-A-CATH REMOVAL;  Surgeon: Abigail Miyamoto, MD;  Location: [facility];  Service: General;  Laterality: N/A;   PORTACATH PLACEMENT Right 10/29/2021   Procedure: INSERTION PORT-A-CATH;  Surgeon: Abigail Miyamoto, MD;  Location: [facility];  Service: General;  Laterality: Right;   REMOVAL OF TISSUE EXPANDER AND PLACEMENT OF IMPLANT Bilateral 10/28/2022   Procedure: REMOVAL OF TISSUE EXPANDER AND PLACEMENT OF IMPLANT,  repair of tissue defect to right lateral chest wall;  Surgeon: Peggye Form, DO;  Location: [facility];  Service: Plastics;  Laterality: Bilateral;   ROBOTIC ASSISTED SALPINGO  OOPHERECTOMY Bilateral 09/15/2022   Procedure: XI ROBOTIC ASSISTED SALPINGO OOPHORECTOMY;  Surgeon: Carver Fila, MD;  Location: [facility];  Service: Gynecology;  Laterality: Bilateral;   TOTAL MASTECTOMY Right 10/29/2021   Procedure: RIGHT TOTAL MASTECTOMY;  Surgeon: Abigail Miyamoto, MD;  Location: [facility];  Service: General;  Laterality: Right;   TRIGGER FINGER RELEASE Left 05/08/2020   Procedure: LEFT LONG FINGER TRIGGER RELEASE AND LEFT RING FINGER TRIGGER RELEASE;  Surgeon: Betha Loa, MD;  Location: [facility];  Service: Orthopedics;  Laterality: Left;   trigger finger release rt hand     Patient Active Problem List   Diagnosis Date Noted   Acquired absence of right breast 07/26/2022   Cellulitis of right breast 03/10/2022   Medication management 03/10/2022   Breast cancer (HCC) 10/29/2021   BRCA2 gene mutation positive 09/26/2021   Genetic testing 09/26/2021   Family history of breast cancer 09/22/2021   Family history of pancreatic cancer 09/22/2021   Family history of ovarian cancer 09/22/2021   Malignant neoplasm of lower-outer quadrant of left breast of female, estrogen receptor negative (HCC) 09/11/2021    PCP: None  REFERRING PROVIDER: Caroline More PA-C  REFERRING DIAG:  Diagnosis  Z98.890 (ICD-10-CM) - S/P breast reconstruction    THERAPY DIAG:  Stiffness of right shoulder, not elsewhere classified  Breast pain, right  Aftercare following surgery for neoplasm  ONSET DATE: 10/29/21  Rationale for Evaluation and Treatment: Rehabilitation  SUBJECTIVE:                                                                                                                                                                                           SUBJECTIVE STATEMENT: It is really better.  The only area we are not working on that I notice is under the Rt ribs.    PERTINENT HISTORY: She most recently underwent bilateral exchange of  tissue expanders for implants and bilateral capsulotomies for implant repositioning with Dr. Ulice Bold on 10/28/2022. Lt breast cancer 2023 with bilateral mastectomy 10/29/21 with 5 negative nodes. 12/17/21 - removal of Rt expander with washout. 07/26/22 - Rt lat flap and reconstruction with another expander. The Right side has a hx of 3 staph infections.   Completed chemo with ACT.  No radiation.    PAIN:  Are you having pain? No pt reports it is only painful to touch  PRECAUTIONS: Lt lymphedema risk    RED FLAGS: None   WEIGHT BEARING RESTRICTIONS: No  FALLS:  Has patient fallen in last 6 months? No  LIVING ENVIRONMENT: Lives with: lives with their family, lives with their spouse, and lives with their daughter  OCCUPATION: work at home.  It will start to become painful at my desk.  I have to learn how to take more breaks.    LEISURE: gardening, crocheting, I am not supposed to lift more than 4# until 12/09/22 for activity restrictions, I normally lift at home - up to 25#.    HAND DOMINANCE: right   PRIOR LEVEL OF FUNCTION: Independent  PATIENT GOALS: get back to normal.     OBJECTIVE: Note: Objective measures were completed at Evaluation unless otherwise noted.  COGNITION: Overall cognitive status: Within functional limits for tasks assessed   PALPATION: Very tender to light palpation Rt breast, trunk, chest wall and around lat incision.  Scar tissue thicker Rt lateral breast   OBSERVATIONS / OTHER ASSESSMENTS: well healed incisions, increased scar tissue Rt breast  POSTURE: WNL  UPPER EXTREMITY AROM/PROM:  A/PROM RIGHT   eval  RIGHT 12/08/22  Shoulder extension 60   Shoulder flexion 140 - pull 149 with pull  Shoulder abduction 125 - pect pull 128 with pull  Shoulder internal rotation    Shoulder external rotation 100     (Blank rows = not tested)  A/PROM LEFT   eval  Shoulder extension 60  Shoulder flexion 165  Shoulder abduction 165  Shoulder internal  rotation   Shoulder external rotation 105    (Blank rows = not tested)  CERVICAL AROM: All within normal limits:   UPPER EXTREMITY STRENGTH:   L-DEX LYMPHEDEMA SCREENING: The patient was assessed using the L-Dex machine today to produce a lymphedema index baseline score. The patient will be reassessed on a regular basis (typically every 3 months) to obtain new L-Dex scores. If the score is > 6.5 points away from his/her baseline score indicating onset of subclinical lymphedema, it will be recommended to wear a compression garment for 4 weeks, 12 hours per day and then be reassessed. If the score continues to be > 6.5 points from baseline at reassessment, we will initiate lymphedema treatment. Assessing in this manner has a 95% rate of preventing clinically significant lymphedema.  QUICK DASH SURVEY: 22% limited from 18% baseline    TODAY'S TREATMENT:                                                                                                                                          DATE:  12/13/22: Pulleys x 2 min in direction of flexion and abduction for dynamic warm up  Ball up wall x 10 reps in direction of flexion and  10 reps in to R abduction  Supine over 1/2 foam roll with arms outstretched x 2 min, then adding breathing into the tight rib section on the Rt x 5,  then alternating flexion x 10 reps each with pt unable to touch mat with R UE, open book x 10 reps with pt able to touch mat bilaterally In supine:  Gentle scar mobilization to area of scar tissue just under area of fullness, scar mob at drain site with intermittent +2-3 ttp randomly over Rt ribcage more of a nn sensitivity.  In L s/l: STM to area surrounding lat flap scar and along scar Gentle suction cupping with cocoa butter gentle moving suction across while constantly monitoring pt's response and pain levels   12/09/22: Pulleys x 2 min in direction of flexion and abduction with pt returning therapist demo Ball up  wall x 10 reps in direction of flexion and 10 reps in to R abduction with pt returning therapist demo and leaning in for a stretch at end range Supine over 1/2 foam roll with arms outstretched x 2 min, then alternating flexion x 10 reps each with pt unable to touch mat with R UE, open book x 10 reps with pt able to touch mat bilaterally Supine on large mat table: LTR with arms outstretch x 3 reps in each direction with 20 sec holds In supine:  MLD to area of fullness at inferior breast moving fluid towards R axilla (nodes all intact)  Gentle scar mobilization to area of scar tissue just under area of fullness In L s/l: Gentle STM to area surrounding lat flap scar and along scar Gentle suction cupping with cocoa butter gentle moving suction across while constantly monitoring pt's response and pain levels   12/08/22: Pulleys x 2 min in direction of flexion and 1 min and 30 sec in direction of abduction with pt returning therapist demo Ball up wall x 10 reps in direction of flexion and 10 reps in to R abduction with pt returning therapist demo and leaning in for a stretch at end range In supine:  PROM to R shoulder in to end range flexion and abduction MLD to area of fullness at inferior breast moving fluid towards R axilla (nodes all intact) - instructed pt in skin stretch technique and had her return demo Gentle scar mobilization to area of scar tissue just under area of fullness In L s/l: Gentle STM to area surrounding lat flap scar and along scar Gentle suction cupping with cocoa butter gentle moving suction across while constantly monitoring pt's response and pain levels   12/02/22 Eval performed Discussed de-sensitization principles and signals to the brain and how to perform touch with different textures and pressure and in different directions.   Education on initial HEP per below with handout given.   PATIENT EDUCATION:  Education details: per today's note Person educated:  Patient Education method: Explanation, Demonstration, Actor cues, Verbal cues, and Handouts Education comprehension: verbalized understanding  HOME EXERCISE PROGRAM: Access Code: [email or web address] URL: [email or web address] Date: 12/02/2022 Prepared by: Gwenevere Abbot  Exercises - Supine Lower Trunk Rotation  - 1 x daily - 7 x weekly - 1-3 sets - 10 reps - 20-30 seconds hold - Supine Shoulder Flexion Extension AAROM with Dowel  - 1-2 x daily - 7 x weekly - 10 reps - 5 seconds hold - Standing Shoulder Flexion Wall Walk  - 1 x daily - 7 x weekly - 1-3 sets - 10 reps - 3-4 sec hold  ASSESSMENT:  CLINICAL IMPRESSION: Pt is doing much better with sensitivity and pain at the lat flap incision and on her back. She is surprised how much better she feels.  She now notes most of the discomort is in her Rt rib cage - into the Rt breast.  This pain is described as her "skin ripping".  Pain seems to be like nn entrapment/referred nerve pain.  Did not seem like she could tolerate cupping here today and that our cup would be too large, but she may tolerate a smaller cup here.  Focused on manual techniques    OBJECTIVE IMPAIRMENTS: decreased activity tolerance, decreased mobility, decreased ROM, decreased strength, and increased fascial restrictions.   ACTIVITY LIMITATIONS: carrying and lifting  PARTICIPATION LIMITATIONS: community activity and yard work  PERSONAL FACTORS: surgical hx are also affecting patient's functional outcome.   REHAB POTENTIAL: Good  CLINICAL DECISION MAKING: Stable/uncomplicated  EVALUATION COMPLEXITY: Low  GOALS: Goals reviewed with patient? Yes  SHORT TERM GOALS: Target date: 12/17/22  Pt will improve Rt AROM by at least 15 degrees to demonstrate improved mobility. Baseline: Flex; 150, Abd: 125 on eval  Goal status: INITIAL  2.  Pt will report resting breast pain of 6/10 or less Baseline:  Goal status: INITIAL   LONG TERM GOALS: Target date:  01/16/23  Pt will return to gardening and weight lifting activities without limitations  Baseline:  Goal status: INITIAL  2.  Pt will improve AROM to equal to the left side to improve mobility  Baseline:  Goal status: INITIAL  3.  Pt will tolerate normal STM pressure to demonstrate less sensitivity.  Baseline:  Goal status: INITIAL   PLAN:  PT FREQUENCY: 2x/week  PT DURATION: 6 weeks  PLANNED INTERVENTIONS: [id number]- PT Re-evaluation, 97110-Therapeutic exercises, 97530- Therapeutic activity, 97535- Self Care, [id number]- Manual therapy, Patient/Family education, Balance training, Taping, Dry Needling, Joint mobilization, Therapeutic exercises, Therapeutic activity, and Self Care  PLAN FOR NEXT SESSION: reassess Rt ROM and continue AAROM/PROM, Rt trunk STM, MLD/desensitization, cupping, taping  Idamae Lusher, PT 12/13/2022, 9:06 PM

## 2022-12-16 ENCOUNTER — Ambulatory Visit: Payer: Commercial Managed Care - HMO | Admitting: Rehabilitation

## 2022-12-16 ENCOUNTER — Encounter: Payer: Self-pay | Admitting: Rehabilitation

## 2022-12-16 DIAGNOSIS — M25611 Stiffness of right shoulder, not elsewhere classified: Secondary | ICD-10-CM

## 2022-12-16 DIAGNOSIS — N644 Mastodynia: Secondary | ICD-10-CM

## 2022-12-16 DIAGNOSIS — Z483 Aftercare following surgery for neoplasm: Secondary | ICD-10-CM

## 2022-12-16 DIAGNOSIS — Z171 Estrogen receptor negative status [ER-]: Secondary | ICD-10-CM

## 2022-12-16 NOTE — Therapy (Signed)
OUTPATIENT PHYSICAL THERAPY  UPPER EXTREMITY ONCOLOGY TREATMENT  Patient Name: Julie Townsend MRN: [id number] DOB:15-Aug-1965, 57 y.o., female Today's Date: 12/16/2022  END OF SESSION:  PT End of Session - 12/16/22 1059     Visit Number 5    Number of Visits 13    Date for PT Re-Evaluation 01/13/23    PT Start Time 1100    PT Stop Time 1157    PT Time Calculation (min) 57 min    Activity Tolerance Patient tolerated treatment well    Behavior During Therapy [facility] for tasks assessed/performed              Past Medical History:  Diagnosis Date   BRCA2 gene mutation positive 09/26/2021   Breast cancer (HCC) 09/2021   left breast IDC   Family history of breast cancer 09/22/2021   Family history of colon cancer    Family history of ovarian cancer 09/22/2021   Family history of pancreatic cancer 09/22/2021   Family history of prostate cancer    Genetic testing 09/26/2021   Pathogenic variant in BRCA2 at p.G4010* (c.3922G>T).  Report date is September 26, 2021.    The BRCAplus panel offered by [employer] and includes sequencing and deletion/duplication analysis for the following 8 genes: ATM, BRCA1, BRCA2, CDH1, CHEK2, PALB2, PTEN, and TP53.  Results of pan-cancer panel are pending.     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    correct Hands   Port-A-Cath in place 03/10/2022   Past Surgical History:  Procedure Laterality Date   BREAST ENHANCEMENT SURGERY Bilateral 10/28/2022   BREAST IMPLANT REMOVAL Right 12/17/2021   Procedure: Right breast expander removal with washout;  Surgeon: Peggye Form, DO;  Location: [facility];  Service: Plastics;  Laterality: Right;   BREAST RECONSTRUCTION WITH PLACEMENT OF TISSUE EXPANDER AND FLEX HD (ACELLULAR HYDRATED DERMIS) Bilateral 10/29/2021   Procedure: BREAST RECONSTRUCTION WITH PLACEMENT OF TISSUE EXPANDER AND FLEX HD (ACELLULAR HYDRATED DERMIS);  Surgeon: Peggye Form,  DO;  Location: [facility];  Service: Plastics;  Laterality: Bilateral;   BREAST RECONSTRUCTION WITH PLACEMENT OF TISSUE EXPANDER AND FLEX HD (ACELLULAR HYDRATED DERMIS) Right 07/26/2022   Procedure: PLACEMENT OF TISSUE EXPANDER;  Surgeon: Peggye Form, DO;  Location: [facility];  Service: Plastics;  Laterality: Right;   CARPAL TUNNEL RELEASE Right 06/20/2020   Procedure: CARPAL TUNNEL RELEASE;  Surgeon: Betha Loa, MD;  Location: [facility];  Service: Orthopedics;  Laterality: Right;   DILATION AND CURETTAGE OF UTERUS     IRRIGATION AND DEBRIDEMENT OF WOUND WITH SPLIT THICKNESS SKIN GRAFT Right 03/15/2022   Procedure: Debridement of right breast;  Surgeon: Peggye Form, DO;  Location: [facility];  Service: Plastics;  Laterality: Right;   LATISSIMUS FLAP TO BREAST Right 07/26/2022   Procedure: Right breast reconstruction and latissimus muscle flap;  Surgeon: Peggye Form, DO;  Location: [facility];  Service: Plastics;  Laterality: Right;   MASTECTOMY     MASTECTOMY W/ SENTINEL NODE BIOPSY Left 10/29/2021   Procedure: LEFT MASTECTOMY WITH LEFT SENTINEL LYMPH NODE BIOPSY;  Surgeon: Abigail Miyamoto, MD;  Location: [facility];  Service: General;  Laterality: Left;   PORT-A-CATH REMOVAL N/A 07/26/2022   Procedure: PORT-A-CATH REMOVAL;  Surgeon: Abigail Miyamoto, MD;  Location: [facility];  Service: General;  Laterality: N/A;   PORTACATH PLACEMENT Right 10/29/2021   Procedure: INSERTION PORT-A-CATH;  Surgeon: Abigail Miyamoto, MD;  Location: [facility];  Service: General;  Laterality: Right;   REMOVAL OF TISSUE EXPANDER AND PLACEMENT OF IMPLANT Bilateral 10/28/2022   Procedure: REMOVAL OF TISSUE EXPANDER AND PLACEMENT OF IMPLANT,  repair of tissue defect to right lateral chest wall;  Surgeon: Peggye Form, DO;  Location: [facility];  Service: Plastics;  Laterality: Bilateral;   ROBOTIC ASSISTED SALPINGO  OOPHERECTOMY Bilateral 09/15/2022   Procedure: XI ROBOTIC ASSISTED SALPINGO OOPHORECTOMY;  Surgeon: Carver Fila, MD;  Location: [facility];  Service: Gynecology;  Laterality: Bilateral;   TOTAL MASTECTOMY Right 10/29/2021   Procedure: RIGHT TOTAL MASTECTOMY;  Surgeon: Abigail Miyamoto, MD;  Location: [facility];  Service: General;  Laterality: Right;   TRIGGER FINGER RELEASE Left 05/08/2020   Procedure: LEFT LONG FINGER TRIGGER RELEASE AND LEFT RING FINGER TRIGGER RELEASE;  Surgeon: Betha Loa, MD;  Location:  [facility];  Service: Orthopedics;  Laterality: Left;   trigger finger release rt hand     Patient Active Problem List   Diagnosis Date Noted   Acquired absence of right breast 07/26/2022   Cellulitis of right breast 03/10/2022   Medication management 03/10/2022   Breast cancer (HCC) 10/29/2021   BRCA2 gene mutation positive 09/26/2021   Genetic testing 09/26/2021   Family history of breast cancer 09/22/2021   Family history of pancreatic cancer 09/22/2021   Family history of ovarian cancer 09/22/2021   Malignant neoplasm of lower-outer quadrant of left breast of female, estrogen receptor negative (HCC) 09/11/2021    PCP: None  REFERRING PROVIDER: Caroline More PA-C  REFERRING DIAG:  Diagnosis  Z98.890 (ICD-10-CM) - S/P breast reconstruction    THERAPY DIAG:  Stiffness of right shoulder, not elsewhere classified  Breast pain, right  Aftercare following surgery for neoplasm  Malignant neoplasm of lower-outer quadrant of left breast of female, estrogen receptor negative (HCC)  ONSET DATE: 10/29/21  Rationale for Evaluation and Treatment: Rehabilitation  SUBJECTIVE:                                                                                                                                                                                           SUBJECTIVE STATEMENT: it is not too sore after last time.     PERTINENT HISTORY:  She most recently underwent bilateral exchange of tissue expanders for implants and bilateral capsulotomies for implant repositioning with Dr. Ulice Bold on 10/28/2022. [name] breast cancer 2023 with bilateral mastectomy 10/29/21 with 5 negative nodes. 12/17/21 - removal of Rt expander with washout. 07/26/22 - Rt lat flap and reconstruction with another expander. The Right side has a hx of 3 staph infections.   Completed  chemo with ACT.  No radiation.    PAIN:  Are you having pain? No pt reports it is only painful to touch  PRECAUTIONS: [name] lymphedema risk    RED FLAGS: None   WEIGHT BEARING RESTRICTIONS: No  FALLS:  Has patient fallen in last 6 months? No  LIVING ENVIRONMENT: Lives with: lives with their family, lives with their spouse, and lives with their daughter  OCCUPATION: work at home.  It will start to become painful at my desk.  I have to learn how to take more breaks.    LEISURE: gardening, crocheting, I am not supposed to lift more than 4# until 12/09/22 for activity restrictions, I normally lift at home - up to 25#.    HAND DOMINANCE: right   PRIOR LEVEL OF FUNCTION: Independent  PATIENT GOALS: get back to normal.     OBJECTIVE: Note: Objective measures were completed at Evaluation unless otherwise noted.  COGNITION: Overall cognitive status: Within functional limits for tasks assessed   PALPATION: Very tender to light palpation Rt breast, trunk, chest wall and around lat incision.  Scar tissue thicker Rt lateral breast   OBSERVATIONS / OTHER ASSESSMENTS: well healed incisions, increased scar tissue Rt breast  POSTURE: WNL  UPPER EXTREMITY AROM/PROM:  A/PROM RIGHT   eval  RIGHT 12/08/22  Shoulder extension 60   Shoulder flexion 140 - pull 149 with pull  Shoulder abduction 125 - pect pull 128 with pull  Shoulder internal rotation    Shoulder external rotation 100     (Blank rows = not tested)  A/PROM LEFT   eval  Shoulder extension 60  Shoulder flexion 165   Shoulder abduction 165  Shoulder internal rotation   Shoulder external rotation 105    (Blank rows = not tested)  CERVICAL AROM: All within normal limits:   UPPER EXTREMITY STRENGTH:   L-DEX LYMPHEDEMA SCREENING: The patient was assessed using the L-Dex machine today to produce a lymphedema index baseline score. The patient will be reassessed on a regular basis (typically every 3 months) to obtain new L-Dex scores. If the score is > 6.5 points away from his/her baseline score indicating onset of subclinical lymphedema, it will be recommended to wear a compression garment for 4 weeks, 12 hours per day and then be reassessed. If the score continues to be > 6.5 points from baseline at reassessment, we will initiate lymphedema treatment. Assessing in this manner has a 95% rate of preventing clinically significant lymphedema.  QUICK DASH SURVEY: 22% limited from 18% baseline    TODAY'S TREATMENT:                                                                                                                                          DATE:  12/16/22 Pulleys x 2 min in direction of flexion and abduction for dynamic warm up  Ball up wall x 10 reps in  direction of flexion and 10 reps in to R abduction  Supine over 1/2 foam roll with arms outstretched x 2 min, then adding breathing into the tight rib section on the Rt x 5, snow angel,  then alternating flexion x 10 reps each with pt unable to touch mat with R UE, open book x 5 reps  In supine:  Gentle scar mobilization to area of scar tissue just under area of fullness, scar mob at drain site without jumpiness today. IASTM with wave massage side following ribs from in to out x 7 sweeps.  In L s/l: STM to area surrounding lat flap scar and along scar Gentle suction cupping with cocoa butter gentle moving suction across while constantly monitoring pt's response and pain levels   12/13/22: Pulleys x 2 min in direction of flexion and abduction for  dynamic warm up  Ball up wall x 10 reps in direction of flexion and 10 reps in to R abduction  Supine over 1/2 foam roll with arms outstretched x 2 min, then adding breathing into the tight rib section on the Rt x 5,  then alternating flexion x 10 reps each with pt unable to touch mat with R UE, open book x 10 reps with pt able to touch mat bilaterally In supine:  Gentle scar mobilization to area of scar tissue just under area of fullness, scar mob at drain site with intermittent +2-3 ttp randomly over Rt ribcage more of a nn sensitivity.  In L s/l: STM to area surrounding lat flap scar and along scar Gentle suction cupping with cocoa butter gentle moving suction across while constantly monitoring pt's response and pain levels   12/09/22: Pulleys x 2 min in direction of flexion and abduction with pt returning therapist demo Ball up wall x 10 reps in direction of flexion and 10 reps in to R abduction with pt returning therapist demo and leaning in for a stretch at end range Supine over 1/2 foam roll with arms outstretched x 2 min, then alternating flexion x 10 reps each with pt unable to touch mat with R UE, open book x 10 reps with pt able to touch mat bilaterally Supine on large mat table: LTR with arms outstretch x 3 reps in each direction with 20 sec holds In supine:  MLD to area of fullness at inferior breast moving fluid towards R axilla (nodes all intact)  Gentle scar mobilization to area of scar tissue just under area of fullness In L s/l: Gentle STM to area surrounding lat flap scar and along scar Gentle suction cupping with cocoa butter gentle moving suction across while constantly monitoring pt's response and pain levels   12/08/22: Pulleys x 2 min in direction of flexion and 1 min and 30 sec in direction of abduction with pt returning therapist demo Ball up wall x 10 reps in direction of flexion and 10 reps in to R abduction with pt returning therapist demo and leaning in for a  stretch at end range In supine:  PROM to R shoulder in to end range flexion and abduction MLD to area of fullness at inferior breast moving fluid towards R axilla (nodes all intact) - instructed pt in skin stretch technique and had her return demo Gentle scar mobilization to area of scar tissue just under area of fullness In L s/l: Gentle STM to area surrounding lat flap scar and along scar Gentle suction cupping with cocoa butter gentle moving suction across while constantly monitoring pt's response  and pain levels   12/02/22 Eval performed Discussed de-sensitization principles and signals to the brain and how to perform touch with different textures and pressure and in different directions.   Education on initial HEP per below with handout given.   PATIENT EDUCATION:  Education details: per today's note Person educated: Patient Education method: Explanation, Demonstration, Actor cues, Verbal cues, and Handouts Education comprehension: verbalized understanding  HOME EXERCISE PROGRAM: Access Code: [email or web address] URL: [email or web address] Date: 12/02/2022 Prepared by: Gwenevere Abbot  Exercises - Supine Lower Trunk Rotation  - 1 x daily - 7 x weekly - 1-3 sets - 10 reps - 20-30 seconds hold - Supine Shoulder Flexion Extension AAROM with Dowel  - 1-2 x daily - 7 x weekly - 10 reps - 5 seconds hold - Standing Shoulder Flexion Wall Walk  - 1 x daily - 7 x weekly - 1-3 sets - 10 reps - 3-4 sec hold  ASSESSMENT:  CLINICAL IMPRESSION: Much better tolerate to STM today. No jumpiness overall.   OBJECTIVE IMPAIRMENTS: decreased activity tolerance, decreased mobility, decreased ROM, decreased strength, and increased fascial restrictions.   ACTIVITY LIMITATIONS: carrying and lifting  PARTICIPATION LIMITATIONS: community activity and yard work  PERSONAL FACTORS: surgical hx are also affecting patient's functional outcome.   REHAB POTENTIAL: Good  CLINICAL DECISION MAKING:  Stable/uncomplicated  EVALUATION COMPLEXITY: Low  GOALS: Goals reviewed with patient? Yes  SHORT TERM GOALS: Target date: 12/17/22  Pt will improve Rt AROM by at least 15 degrees to demonstrate improved mobility. Baseline: Flex; 150, Abd: 125 on eval  Goal status: INITIAL  2.  Pt will report resting breast pain of 6/10 or less Baseline:  Goal status: INITIAL   LONG TERM GOALS: Target date: 01/16/23  Pt will return to gardening and weight lifting activities without limitations  Baseline:  Goal status: INITIAL  2.  Pt will improve AROM to equal to the left side to improve mobility  Baseline:  Goal status: INITIAL  3.  Pt will tolerate normal STM pressure to demonstrate less sensitivity.  Baseline:  Goal status: INITIAL   PLAN:  PT FREQUENCY: 2x/week  PT DURATION: 6 weeks  PLANNED INTERVENTIONS: [id number]- PT Re-evaluation, 97110-Therapeutic exercises, 97530- Therapeutic activity, 97535- Self Care, 40102- Manual therapy, Patient/Family education, Balance training, Taping, Dry Needling, Joint mobilization, Therapeutic exercises, Therapeutic activity, and Self Care  PLAN FOR NEXT SESSION: reassess Rt ROM and continue AAROM/PROM, Rt trunk STM, MLD/desensitization, cupping, taping (8 weeks post op chest surgery 12/23/22 - lat flap is much older)  Idamae Lusher, PT 12/16/2022, 11:59 AM

## 2022-12-21 ENCOUNTER — Ambulatory Visit: Payer: Commercial Managed Care - HMO | Admitting: Rehabilitation

## 2022-12-21 ENCOUNTER — Encounter: Payer: Self-pay | Admitting: Rehabilitation

## 2022-12-21 DIAGNOSIS — Z483 Aftercare following surgery for neoplasm: Secondary | ICD-10-CM

## 2022-12-21 DIAGNOSIS — N644 Mastodynia: Secondary | ICD-10-CM

## 2022-12-21 DIAGNOSIS — Z171 Estrogen receptor negative status [ER-]: Secondary | ICD-10-CM

## 2022-12-21 DIAGNOSIS — M25611 Stiffness of right shoulder, not elsewhere classified: Secondary | ICD-10-CM | POA: Diagnosis not present

## 2022-12-21 DIAGNOSIS — R293 Abnormal posture: Secondary | ICD-10-CM

## 2022-12-21 NOTE — Therapy (Signed)
OUTPATIENT PHYSICAL THERAPY  UPPER EXTREMITY ONCOLOGY TREATMENT  Patient Name: Julie Townsend MRN: [id number] DOB:1965-05-21, 57 y.o., female Today's Date: 12/21/2022  END OF SESSION:  PT End of Session - 12/21/22 1353     Visit Number 6    Number of Visits 13    Date for PT Re-Evaluation 01/13/23    PT Start Time 1300    PT Stop Time 1355    PT Time Calculation (min) 55 min    Activity Tolerance Patient tolerated treatment well    Behavior During Therapy [facility] for tasks assessed/performed               Past Medical History:  Diagnosis Date   BRCA2 gene mutation positive 09/26/2021   Breast cancer (HCC) 09/2021   left breast IDC   Family history of breast cancer 09/22/2021   Family history of colon cancer    Family history of ovarian cancer 09/22/2021   Family history of pancreatic cancer 09/22/2021   Family history of prostate cancer    Genetic testing 09/26/2021   Pathogenic variant in BRCA2 at p.[id number]* (c.3922G>T).  Report date is September 26, 2021.    The BRCAplus panel offered by [employer] and includes sequencing and deletion/duplication analysis for the following 8 genes: ATM, BRCA1, BRCA2, CDH1, CHEK2, PALB2, PTEN, and TP53.  Results of pan-cancer panel are pending.     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    correct Hands   Port-A-Cath in place 03/10/2022   Past Surgical History:  Procedure Laterality Date   BREAST ENHANCEMENT SURGERY Bilateral 10/28/2022   BREAST IMPLANT REMOVAL Right 12/17/2021   Procedure: Right breast expander removal with washout;  Surgeon: Peggye Form, DO;  Location: [facility];  Service: Plastics;  Laterality: Right;   BREAST RECONSTRUCTION WITH PLACEMENT OF TISSUE EXPANDER AND FLEX HD (ACELLULAR HYDRATED DERMIS) Bilateral 10/29/2021   Procedure: BREAST RECONSTRUCTION WITH PLACEMENT OF TISSUE EXPANDER AND FLEX HD (ACELLULAR HYDRATED DERMIS);  Surgeon: Peggye Form,  DO;  Location: [facility];  Service: Plastics;  Laterality: Bilateral;   BREAST RECONSTRUCTION WITH PLACEMENT OF TISSUE EXPANDER AND FLEX HD (ACELLULAR HYDRATED DERMIS) Right 07/26/2022   Procedure: PLACEMENT OF TISSUE EXPANDER;  Surgeon: Peggye Form, DO;  Location: [facility];  Service: Plastics;  Laterality: Right;   CARPAL TUNNEL RELEASE Right 06/20/2020   Procedure: CARPAL TUNNEL RELEASE;  Surgeon: Betha Loa, MD;  Location: [facility];  Service: Orthopedics;  Laterality: Right;   DILATION AND CURETTAGE OF UTERUS     IRRIGATION AND DEBRIDEMENT OF WOUND WITH SPLIT THICKNESS SKIN GRAFT Right 03/15/2022   Procedure: Debridement of right breast;  Surgeon: Peggye Form, DO;  Location: [facility];  Service: Plastics;  Laterality: Right;   LATISSIMUS FLAP TO BREAST Right 07/26/2022   Procedure: Right breast reconstruction and latissimus muscle flap;  Surgeon: Peggye Form, DO;  Location: [facility];  Service: Plastics;  Laterality: Right;   MASTECTOMY     MASTECTOMY W/ SENTINEL NODE BIOPSY Left 10/29/2021   Procedure: LEFT MASTECTOMY WITH LEFT SENTINEL LYMPH NODE BIOPSY;  Surgeon: Abigail Miyamoto, MD;  Location: [facility];  Service: General;  Laterality: Left;   PORT-A-CATH REMOVAL N/A 07/26/2022   Procedure: PORT-A-CATH REMOVAL;  Surgeon: Abigail Miyamoto, MD;  Location: [facility];  Service: General;  Laterality: N/A;   PORTACATH PLACEMENT Right 10/29/2021   Procedure: INSERTION PORT-A-CATH;  Surgeon: Abigail Miyamoto, MD;  Location: [facility];  Service: General;  Laterality: Right;   REMOVAL OF TISSUE EXPANDER AND PLACEMENT OF IMPLANT Bilateral 10/28/2022   Procedure: REMOVAL OF TISSUE EXPANDER AND PLACEMENT OF IMPLANT,  repair of tissue defect to right lateral chest wall;  Surgeon: Peggye Form, DO;  Location: [facility];  Service: Plastics;  Laterality: Bilateral;   ROBOTIC ASSISTED SALPINGO  OOPHERECTOMY Bilateral 09/15/2022   Procedure: XI ROBOTIC ASSISTED SALPINGO OOPHORECTOMY;  Surgeon: Carver Fila, MD;  Location: [facility];  Service: Gynecology;  Laterality: Bilateral;   TOTAL MASTECTOMY Right 10/29/2021   Procedure: RIGHT TOTAL MASTECTOMY;  Surgeon: Abigail Miyamoto, MD;  Location: [facility];  Service: General;  Laterality: Right;   TRIGGER FINGER RELEASE Left 05/08/2020   Procedure: LEFT LONG FINGER TRIGGER RELEASE AND LEFT RING FINGER TRIGGER RELEASE;  Surgeon: Betha Loa, MD;  Location: [facility];  Service: Orthopedics;  Laterality: Left;   trigger finger release rt hand     Patient Active Problem List   Diagnosis Date Noted   Acquired absence of right breast 07/26/2022   Cellulitis of right breast 03/10/2022   Medication management 03/10/2022   Breast cancer (HCC) 10/29/2021   BRCA2 gene mutation positive 09/26/2021   Genetic testing 09/26/2021   Family history of breast cancer 09/22/2021   Family history of pancreatic cancer 09/22/2021   Family history of ovarian cancer 09/22/2021   Malignant neoplasm of lower-outer quadrant of left breast of female, estrogen receptor negative (HCC) 09/11/2021    PCP: None  REFERRING PROVIDER: Caroline More PA-C  REFERRING DIAG:  Diagnosis  Z98.890 (ICD-10-CM) - S/P breast reconstruction    THERAPY DIAG:  Stiffness of right shoulder, not elsewhere classified  Breast pain, right  Aftercare following surgery for neoplasm  Malignant neoplasm of lower-outer quadrant of left breast of female, estrogen receptor negative (HCC)  Abnormal posture  ONSET DATE: 10/29/21  Rationale for Evaluation and Treatment: Rehabilitation  SUBJECTIVE:                                                                                                                                                                                           SUBJECTIVE STATEMENT:  Not bad.  I did a lot this weekend.  I  did leaves and gardening.  Just sore.    PERTINENT HISTORY: She most recently underwent bilateral exchange of tissue expanders for implants and bilateral capsulotomies for implant repositioning with Dr. Ulice Bold on 10/28/2022. Lt breast cancer 2023 with bilateral mastectomy 10/29/21 with 5 negative nodes. 12/17/21 - removal of Rt expander with washout. 07/26/22 - Rt lat flap and reconstruction with another  expander. The Right side has a hx of 3 staph infections.   Completed chemo with ACT.  No radiation.    PAIN:  Are you having pain? No pt reports it is only painful to touch  PRECAUTIONS: Lt lymphedema risk    RED FLAGS: None   WEIGHT BEARING RESTRICTIONS: No  FALLS:  Has patient fallen in last 6 months? No  LIVING ENVIRONMENT: Lives with: lives with their family, lives with their spouse, and lives with their daughter  OCCUPATION: work at home.  It will start to become painful at my desk.  I have to learn how to take more breaks.    LEISURE: gardening, crocheting, I am not supposed to lift more than 4# until 12/09/22 for activity restrictions, I normally lift at home - up to 25#.    HAND DOMINANCE: right   PRIOR LEVEL OF FUNCTION: Independent  PATIENT GOALS: get back to normal.     OBJECTIVE: Note: Objective measures were completed at Evaluation unless otherwise noted.  COGNITION: Overall cognitive status: Within functional limits for tasks assessed   PALPATION: Very tender to light palpation Rt breast, trunk, chest wall and around lat incision.  Scar tissue thicker Rt lateral breast   OBSERVATIONS / OTHER ASSESSMENTS: well healed incisions, increased scar tissue Rt breast  POSTURE: WNL  UPPER EXTREMITY AROM/PROM:  A/PROM RIGHT   eval  RIGHT 12/08/22 12/21/22  Shoulder extension 60    Shoulder flexion 140 - pull 149 with pull 150 pull  Shoulder abduction 125 - pect pull 128 with pull 130 pull   Shoulder internal rotation     Shoulder external rotation 100       (Blank rows = not tested)  A/PROM LEFT   eval  Shoulder extension 60  Shoulder flexion 165  Shoulder abduction 165  Shoulder internal rotation   Shoulder external rotation 105    (Blank rows = not tested)  CERVICAL AROM: All within normal limits:   UPPER EXTREMITY STRENGTH:   L-DEX LYMPHEDEMA SCREENING: The patient was assessed using the L-Dex machine today to produce a lymphedema index baseline score. The patient will be reassessed on a regular basis (typically every 3 months) to obtain new L-Dex scores. If the score is > 6.5 points away from his/her baseline score indicating onset of subclinical lymphedema, it will be recommended to wear a compression garment for 4 weeks, 12 hours per day and then be reassessed. If the score continues to be > 6.5 points from baseline at reassessment, we will initiate lymphedema treatment. Assessing in this manner has a 95% rate of preventing clinically significant lymphedema.  QUICK DASH SURVEY: 22% limited from 18% baseline    TODAY'S TREATMENT:                                                                                                                                          DATE:  12/21/22 Pulleys x 2 min in direction of flexion and abduction for dynamic warm up  Supine over 1/2 foam roll with arms outstretched x 2 min, then adding breathing into the tight rib section on the Rt x 5, snow angel,  then alternating flexion x 10 reps Yellow band supine scap x 5 each horizontal abduction, ER bil x 5, diagonals x 5 bil all with initial instruction.   In supine:  scar mobilization/STM to Rt breast surrounding tissue, not on implant to work on desensitization and flexibility, scar mob at drain site without jumpiness today. Normal pressure used.   12/16/22 Pulleys x 2 min in direction of flexion and abduction for dynamic warm up  Ball up wall x 10 reps in direction of flexion and 10 reps in to R abduction  Supine over 1/2 foam roll with arms  outstretched x 2 min, then adding breathing into the tight rib section on the Rt x 5, snow angel,  then alternating flexion x 10 reps each with pt unable to touch mat with R UE, open book x 5 reps  In supine:  Gentle scar mobilization to area of scar tissue just under area of fullness, scar mob at drain site without jumpiness today. IASTM with wave massage side following ribs from in to out x 7 sweeps.  In L s/l: STM to area surrounding lat flap scar and along scar Gentle suction cupping with cocoa butter gentle moving suction across while constantly monitoring pt's response and pain levels   12/13/22: Pulleys x 2 min in direction of flexion and abduction for dynamic warm up  Ball up wall x 10 reps in direction of flexion and 10 reps in to R abduction  Supine over 1/2 foam roll with arms outstretched x 2 min, then adding breathing into the tight rib section on the Rt x 5,  then alternating flexion x 10 reps each with pt unable to touch mat with R UE, open book x 10 reps with pt able to touch mat bilaterally In supine:  Gentle scar mobilization to area of scar tissue just under area of fullness, scar mob at drain site with intermittent +2-3 ttp randomly over Rt ribcage more of a nn sensitivity.  In L s/l: STM to area surrounding lat flap scar and along scar Gentle suction cupping with cocoa butter gentle moving suction across while constantly monitoring pt's response and pain levels   12/09/22: Pulleys x 2 min in direction of flexion and abduction with pt returning therapist demo Ball up wall x 10 reps in direction of flexion and 10 reps in to R abduction with pt returning therapist demo and leaning in for a stretch at end range Supine over 1/2 foam roll with arms outstretched x 2 min, then alternating flexion x 10 reps each with pt unable to touch mat with R UE, open book x 10 reps with pt able to touch mat bilaterally Supine on large mat table: LTR with arms outstretch x 3 reps in each direction  with 20 sec holds In supine:  MLD to area of fullness at inferior breast moving fluid towards R axilla (nodes all intact)  Gentle scar mobilization to area of scar tissue just under area of fullness In L s/l: Gentle STM to area surrounding lat flap scar and along scar Gentle suction cupping with cocoa butter gentle moving suction across while constantly monitoring pt's response and pain levels   12/08/22: Pulleys x 2 min in direction of flexion and 1 min  and 30 sec in direction of abduction with pt returning therapist demo Ball up wall x 10 reps in direction of flexion and 10 reps in to R abduction with pt returning therapist demo and leaning in for a stretch at end range In supine:  PROM to R shoulder in to end range flexion and abduction MLD to area of fullness at inferior breast moving fluid towards R axilla (nodes all intact) - instructed pt in skin stretch technique and had her return demo Gentle scar mobilization to area of scar tissue just under area of fullness In L s/l: Gentle STM to area surrounding lat flap scar and along scar Gentle suction cupping with cocoa butter gentle moving suction across while constantly monitoring pt's response and pain levels   12/02/22 Eval performed Discussed de-sensitization principles and signals to the brain and how to perform touch with different textures and pressure and in different directions.   Education on initial HEP per below with handout given.   PATIENT EDUCATION:  Education details: per today's note Person educated: Patient Education method: Explanation, Demonstration, Actor cues, Verbal cues, and Handouts Education comprehension: verbalized understanding  HOME EXERCISE PROGRAM: Access Code: [email or web address] URL: [email or web address] Date: 12/02/2022 Prepared by: Gwenevere Abbot  Exercises - Supine Lower Trunk Rotation  - 1 x daily - 7 x weekly - 1-3 sets - 10 reps - 20-30 seconds hold - Supine Shoulder Flexion Extension  AAROM with Dowel  - 1-2 x daily - 7 x weekly - 10 reps - 5 seconds hold - Standing Shoulder Flexion Wall Walk  - 1 x daily - 7 x weekly - 1-3 sets - 10 reps - 3-4 sec hold  ASSESSMENT:  CLINICAL IMPRESSION: STM performed today with full pressure and pt only having a pain twitch x 1 and this was more due to feeling the sensation in her back when PT was working on the front from the lat flat. Was able to do yardwork and blow leaves.   OBJECTIVE IMPAIRMENTS: decreased activity tolerance, decreased mobility, decreased ROM, decreased strength, and increased fascial restrictions.   ACTIVITY LIMITATIONS: carrying and lifting  PARTICIPATION LIMITATIONS: community activity and yard work  PERSONAL FACTORS: surgical hx are also affecting patient's functional outcome.   REHAB POTENTIAL: Good  CLINICAL DECISION MAKING: Stable/uncomplicated  EVALUATION COMPLEXITY: Low  GOALS: Goals reviewed with patient? Yes  SHORT TERM GOALS: Target date: 12/17/22  Pt will improve Rt AROM by at least 15 degrees to demonstrate improved mobility. Baseline: Flex; 150, Abd: 125 on eval  Goal status: INITIAL  2.  Pt will report resting breast pain of 6/10 or less Baseline:  Goal status: INITIAL   LONG TERM GOALS: Target date: 01/16/23  Pt will return to gardening and weight lifting activities without limitations  Baseline:  Goal status: INITIAL  2.  Pt will improve AROM to equal to the left side to improve mobility  Baseline:  Goal status: INITIAL  3.  Pt will tolerate normal STM pressure to demonstrate less sensitivity.  Baseline:  Goal status: INITIAL   PLAN:  PT FREQUENCY: 2x/week  PT DURATION: 6 weeks  PLANNED INTERVENTIONS: [id number]- PT Re-evaluation, 97110-Therapeutic exercises, 97530- Therapeutic activity, 97535- Self Care, 54098- Manual therapy, Patient/Family education, Balance training, Taping, Dry Needling, Joint mobilization, Therapeutic exercises, Therapeutic activity, and Self  Care  PLAN FOR NEXT SESSION: reassess Rt ROM and continue AAROM/PROM, Rt trunk STM, MLD/desensitization, cupping, taping (8 weeks post op chest surgery 12/23/22 - lat flap is much older)  Memori Sammon, Julieanne Manson, PT  12/21/2022, 6:48 PM

## 2022-12-23 ENCOUNTER — Encounter: Payer: Self-pay | Admitting: Rehabilitation

## 2022-12-23 ENCOUNTER — Ambulatory Visit: Payer: Commercial Managed Care - HMO | Admitting: Rehabilitation

## 2022-12-23 DIAGNOSIS — Z171 Estrogen receptor negative status [ER-]: Secondary | ICD-10-CM

## 2022-12-23 DIAGNOSIS — M25611 Stiffness of right shoulder, not elsewhere classified: Secondary | ICD-10-CM | POA: Diagnosis not present

## 2022-12-23 DIAGNOSIS — N644 Mastodynia: Secondary | ICD-10-CM

## 2022-12-23 DIAGNOSIS — Z483 Aftercare following surgery for neoplasm: Secondary | ICD-10-CM

## 2022-12-23 DIAGNOSIS — C50512 Malignant neoplasm of lower-outer quadrant of left female breast: Secondary | ICD-10-CM

## 2022-12-23 DIAGNOSIS — R293 Abnormal posture: Secondary | ICD-10-CM

## 2022-12-23 NOTE — Therapy (Signed)
OUTPATIENT PHYSICAL THERAPY  UPPER EXTREMITY ONCOLOGY TREATMENT  Patient Name: Julie Townsend MRN: [id number] DOB:September 09, 1965, 57 y.o., female Today's Date: 12/23/2022  END OF SESSION:  PT End of Session - 12/23/22 1819     Visit Number 7    Number of Visits 13    Date for PT Re-Evaluation 01/13/23    PT Start Time 1302    PT Stop Time 1355    PT Time Calculation (min) 53 min    Activity Tolerance Patient tolerated treatment well    Behavior During Therapy [facility] for tasks assessed/performed                Past Medical History:  Diagnosis Date   BRCA2 gene mutation positive 09/26/2021   Breast cancer (HCC) 09/2021   left breast IDC   Family history of breast cancer 09/22/2021   Family history of colon cancer    Family history of ovarian cancer 09/22/2021   Family history of pancreatic cancer 09/22/2021   Family history of prostate cancer    Genetic testing 09/26/2021   Pathogenic variant in BRCA2 at p.Z6606* (c.3922G>T).  Report date is September 26, 2021.    The BRCAplus panel offered by [employer] and includes sequencing and deletion/duplication analysis for the following 8 genes: ATM, BRCA1, BRCA2, CDH1, CHEK2, PALB2, PTEN, and TP53.  Results of pan-cancer panel are pending.     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    correct Hands   Port-A-Cath in place 03/10/2022   Past Surgical History:  Procedure Laterality Date   BREAST ENHANCEMENT SURGERY Bilateral 10/28/2022   BREAST IMPLANT REMOVAL Right 12/17/2021   Procedure: Right breast expander removal with washout;  Surgeon: Peggye Form, DO;  Location: [facility];  Service: Plastics;  Laterality: Right;   BREAST RECONSTRUCTION WITH PLACEMENT OF TISSUE EXPANDER AND FLEX HD (ACELLULAR HYDRATED DERMIS) Bilateral 10/29/2021   Procedure: BREAST RECONSTRUCTION WITH PLACEMENT OF TISSUE EXPANDER AND FLEX HD (ACELLULAR HYDRATED DERMIS);  Surgeon: Peggye Form, DO;  Location: [facility];  Service: Plastics;  Laterality: Bilateral;   BREAST RECONSTRUCTION WITH PLACEMENT OF TISSUE EXPANDER AND FLEX HD (ACELLULAR HYDRATED DERMIS) Right 07/26/2022   Procedure: PLACEMENT OF TISSUE EXPANDER;  Surgeon: Peggye Form, DO;  Location: [facility];  Service: Plastics;  Laterality: Right;   CARPAL TUNNEL RELEASE Right 06/20/2020   Procedure: CARPAL TUNNEL RELEASE;  Surgeon: Betha Loa, MD;  Location: [facility];  Service: Orthopedics;  Laterality: Right;   DILATION AND CURETTAGE OF UTERUS     IRRIGATION AND DEBRIDEMENT OF WOUND WITH SPLIT THICKNESS SKIN GRAFT Right 03/15/2022   Procedure: Debridement of right breast;  Surgeon: Peggye Form, DO;  Location: [facility];  Service: Plastics;  Laterality: Right;   LATISSIMUS FLAP TO BREAST Right 07/26/2022   Procedure: Right breast reconstruction and latissimus muscle flap;  Surgeon: Peggye Form, DO;  Location: [facility];  Service: Plastics;  Laterality: Right;   MASTECTOMY     MASTECTOMY W/ SENTINEL NODE BIOPSY Left 10/29/2021   Procedure: LEFT MASTECTOMY WITH LEFT SENTINEL LYMPH NODE BIOPSY;  Surgeon: Abigail Miyamoto, MD;  Location: [facility];  Service: General;  Laterality: Left;   PORT-A-CATH REMOVAL N/A 07/26/2022   Procedure: PORT-A-CATH REMOVAL;  Surgeon: Abigail Miyamoto, MD;  Location: [facility];  Service: General;  Laterality: N/A;   PORTACATH PLACEMENT Right 10/29/2021   Procedure: INSERTION PORT-A-CATH;  Surgeon: Abigail Miyamoto, MD;  Location:  [facility];  Service: General;  Laterality: Right;   REMOVAL OF TISSUE EXPANDER AND PLACEMENT OF IMPLANT Bilateral 10/28/2022   Procedure: REMOVAL OF TISSUE EXPANDER AND PLACEMENT OF IMPLANT,  repair of tissue defect to right lateral chest wall;  Surgeon: Peggye Form, DO;  Location: [facility];  Service: Plastics;  Laterality: Bilateral;   ROBOTIC ASSISTED SALPINGO  OOPHERECTOMY Bilateral 09/15/2022   Procedure: XI ROBOTIC ASSISTED SALPINGO OOPHORECTOMY;  Surgeon: Carver Fila, MD;  Location: [facility];  Service: Gynecology;  Laterality: Bilateral;   TOTAL MASTECTOMY Right 10/29/2021   Procedure: RIGHT TOTAL MASTECTOMY;  Surgeon: Abigail Miyamoto, MD;  Location: [facility];  Service: General;  Laterality: Right;   TRIGGER FINGER RELEASE Left 05/08/2020   Procedure: LEFT LONG FINGER TRIGGER RELEASE AND LEFT RING FINGER TRIGGER RELEASE;  Surgeon: Betha Loa, MD;  Location: [facility];  Service: Orthopedics;  Laterality: Left;   trigger finger release rt hand     Patient Active Problem List   Diagnosis Date Noted   Acquired absence of right breast 07/26/2022   Cellulitis of right breast 03/10/2022   Medication management 03/10/2022   Breast cancer (HCC) 10/29/2021   BRCA2 gene mutation positive 09/26/2021   Genetic testing 09/26/2021   Family history of breast cancer 09/22/2021   Family history of pancreatic cancer 09/22/2021   Family history of ovarian cancer 09/22/2021   Malignant neoplasm of lower-outer quadrant of left breast of female, estrogen receptor negative (HCC) 09/11/2021    PCP: None  REFERRING PROVIDER: Caroline More PA-C  REFERRING DIAG:  Diagnosis  Z98.890 (ICD-10-CM) - S/P breast reconstruction    THERAPY DIAG:  Stiffness of right shoulder, not elsewhere classified  Breast pain, right  Aftercare following surgery for neoplasm  Malignant neoplasm of lower-outer quadrant of left breast of female, estrogen receptor negative (HCC)  Abnormal posture  ONSET DATE: 10/29/21  Rationale for Evaluation and Treatment: Rehabilitation  SUBJECTIVE:                                                                                                                                                                                           SUBJECTIVE STATEMENT:  Nothing really hurts now which is better.     PERTINENT HISTORY: She most recently underwent bilateral exchange of tissue expanders for implants and bilateral capsulotomies for implant repositioning with Dr. Ulice Bold on 10/28/2022. Lt breast cancer 2023 with bilateral mastectomy 10/29/21 with 5 negative nodes. 12/17/21 - removal of Rt expander with washout. 07/26/22 - Rt lat flap and reconstruction with another expander. The Right side has a hx of 3 staph  infections.   Completed chemo with ACT.  No radiation.    PAIN:  Are you having pain? No pt reports it is only painful to touch  PRECAUTIONS: Lt lymphedema risk    RED FLAGS: None   WEIGHT BEARING RESTRICTIONS: No  FALLS:  Has patient fallen in last 6 months? No  LIVING ENVIRONMENT: Lives with: lives with their family, lives with their spouse, and lives with their daughter  OCCUPATION: work at home.  It will start to become painful at my desk.  I have to learn how to take more breaks.    LEISURE: gardening, crocheting, I am not supposed to lift more than 4# until 12/09/22 for activity restrictions, I normally lift at home - up to 25#.    HAND DOMINANCE: right   PRIOR LEVEL OF FUNCTION: Independent  PATIENT GOALS: get back to normal.     OBJECTIVE: Note: Objective measures were completed at Evaluation unless otherwise noted.  COGNITION: Overall cognitive status: Within functional limits for tasks assessed   PALPATION: Very tender to light palpation Rt breast, trunk, chest wall and around lat incision.  Scar tissue thicker Rt lateral breast   OBSERVATIONS / OTHER ASSESSMENTS: well healed incisions, increased scar tissue Rt breast  POSTURE: WNL  UPPER EXTREMITY AROM/PROM:  A/PROM RIGHT   eval  RIGHT 12/08/22 12/21/22  Shoulder extension 60    Shoulder flexion 140 - pull 149 with pull 150 pull  Shoulder abduction 125 - pect pull 128 with pull 130 pull   Shoulder internal rotation     Shoulder external rotation 100      (Blank rows = not tested)  A/PROM LEFT    eval  Shoulder extension 60  Shoulder flexion 165  Shoulder abduction 165  Shoulder internal rotation   Shoulder external rotation 105    (Blank rows = not tested)  CERVICAL AROM: All within normal limits:   UPPER EXTREMITY STRENGTH:   L-DEX LYMPHEDEMA SCREENING: The patient was assessed using the L-Dex machine today to produce a lymphedema index baseline score. The patient will be reassessed on a regular basis (typically every 3 months) to obtain new L-Dex scores. If the score is > 6.5 points away from his/her baseline score indicating onset of subclinical lymphedema, it will be recommended to wear a compression garment for 4 weeks, 12 hours per day and then be reassessed. If the score continues to be > 6.5 points from baseline at reassessment, we will initiate lymphedema treatment. Assessing in this manner has a 95% rate of preventing clinically significant lymphedema.  QUICK DASH SURVEY: 22% limited from 18% baseline    TODAY'S TREATMENT:                                                                                                                                          DATE:  12/23/22 Pulleys x 2 min in direction of flexion and  abduction for dynamic warm up  Yellow band supine scap x 5 each horizontal abduction, ER bil x 5, diagonals x 5 bil all with vcs reminder of all movements.   In supine:  scar mobilization/STM to Rt breast surrounding tissue, not on implant, to work on desensitization and flexibility, scar mob at drain site without jumpiness today. Normal pressure used. Wave tool small head around the drain scars Then in sidelying: STM Lt lat and QL region  12/21/22 Pulleys x 2 min in direction of flexion and abduction for dynamic warm up  Supine over 1/2 foam roll with arms outstretched x 2 min, then adding breathing into the tight rib section on the Rt x 5, snow angel,  then alternating flexion x 10 reps Yellow band supine scap x 5 each horizontal abduction, ER bil x  5, diagonals x 5 bil all with initial instruction.   In supine:  scar mobilization/STM to Rt breast surrounding tissue, not on implant to work on desensitization and flexibility, scar mob at drain site without jumpiness today. Normal pressure used.   12/16/22 Pulleys x 2 min in direction of flexion and abduction for dynamic warm up  Ball up wall x 10 reps in direction of flexion and 10 reps in to R abduction  Supine over 1/2 foam roll with arms outstretched x 2 min, then adding breathing into the tight rib section on the Rt x 5, snow angel,  then alternating flexion x 10 reps each with pt unable to touch mat with R UE, open book x 5 reps  In supine:  Gentle scar mobilization to area of scar tissue just under area of fullness, scar mob at drain site without jumpiness today. IASTM with wave massage side following ribs from in to out x 7 sweeps.  In L s/l: STM to area surrounding lat flap scar and along scar Gentle suction cupping with cocoa butter gentle moving suction across while constantly monitoring pt's response and pain levels   12/13/22: Pulleys x 2 min in direction of flexion and abduction for dynamic warm up  Ball up wall x 10 reps in direction of flexion and 10 reps in to R abduction  Supine over 1/2 foam roll with arms outstretched x 2 min, then adding breathing into the tight rib section on the Rt x 5,  then alternating flexion x 10 reps each with pt unable to touch mat with R UE, open book x 10 reps with pt able to touch mat bilaterally In supine:  Gentle scar mobilization to area of scar tissue just under area of fullness, scar mob at drain site with intermittent +2-3 ttp randomly over Rt ribcage more of a nn sensitivity.  In L s/l: STM to area surrounding lat flap scar and along scar Gentle suction cupping with cocoa butter gentle moving suction across while constantly monitoring pt's response and pain levels   12/09/22: Pulleys x 2 min in direction of flexion and abduction with  pt returning therapist demo Ball up wall x 10 reps in direction of flexion and 10 reps in to R abduction with pt returning therapist demo and leaning in for a stretch at end range Supine over 1/2 foam roll with arms outstretched x 2 min, then alternating flexion x 10 reps each with pt unable to touch mat with R UE, open book x 10 reps with pt able to touch mat bilaterally Supine on large mat table: LTR with arms outstretch x 3 reps in each direction with 20 sec  holds In supine:  MLD to area of fullness at inferior breast moving fluid towards R axilla (nodes all intact)  Gentle scar mobilization to area of scar tissue just under area of fullness In L s/l: Gentle STM to area surrounding lat flap scar and along scar Gentle suction cupping with cocoa butter gentle moving suction across while constantly monitoring pt's response and pain levels   12/08/22: Pulleys x 2 min in direction of flexion and 1 min and 30 sec in direction of abduction with pt returning therapist demo Ball up wall x 10 reps in direction of flexion and 10 reps in to R abduction with pt returning therapist demo and leaning in for a stretch at end range In supine:  PROM to R shoulder in to end range flexion and abduction MLD to area of fullness at inferior breast moving fluid towards R axilla (nodes all intact) - instructed pt in skin stretch technique and had her return demo Gentle scar mobilization to area of scar tissue just under area of fullness In L s/l: Gentle STM to area surrounding lat flap scar and along scar Gentle suction cupping with cocoa butter gentle moving suction across while constantly monitoring pt's response and pain levels   12/02/22 Eval performed Discussed de-sensitization principles and signals to the brain and how to perform touch with different textures and pressure and in different directions.   Education on initial HEP per below with handout given.   PATIENT EDUCATION:  Education details: per  today's note Person educated: Patient Education method: Explanation, Demonstration, Actor cues, Verbal cues, and Handouts Education comprehension: verbalized understanding  HOME EXERCISE PROGRAM: Access Code: [email or web address] URL: [email or web address] Date: 12/02/2022 Prepared by: Gwenevere Abbot  Exercises - Supine Lower Trunk Rotation  - 1 x daily - 7 x weekly - 1-3 sets - 10 reps - 20-30 seconds hold - Supine Shoulder Flexion Extension AAROM with Dowel  - 1-2 x daily - 7 x weekly - 10 reps - 5 seconds hold - Standing Shoulder Flexion Wall Walk  - 1 x daily - 7 x weekly - 1-3 sets - 10 reps - 3-4 sec hold  ASSESSMENT:  CLINICAL IMPRESSION: STM performed today with full pressure and pt only having pain twitches in sidelying inferior to the flap incision initially but then eliminated with MT.  Pt reports no real pain lately.   OBJECTIVE IMPAIRMENTS: decreased activity tolerance, decreased mobility, decreased ROM, decreased strength, and increased fascial restrictions.   ACTIVITY LIMITATIONS: carrying and lifting  PARTICIPATION LIMITATIONS: community activity and yard work  PERSONAL FACTORS: surgical hx are also affecting patient's functional outcome.   REHAB POTENTIAL: Good  CLINICAL DECISION MAKING: Stable/uncomplicated  EVALUATION COMPLEXITY: Low  GOALS: Goals reviewed with patient? Yes  SHORT TERM GOALS: Target date: 12/17/22  Pt will improve Rt AROM by at least 15 degrees to demonstrate improved mobility. Baseline: Flex; 150, Abd: 125 on eval  Goal status: INITIAL  2.  Pt will report resting breast pain of 6/10 or less Baseline:  Goal status: INITIAL   LONG TERM GOALS: Target date: 01/16/23  Pt will return to gardening and weight lifting activities without limitations  Baseline:  Goal status: INITIAL  2.  Pt will improve AROM to equal to the left side to improve mobility  Baseline:  Goal status: INITIAL  3.  Pt will tolerate normal STM pressure to  demonstrate less sensitivity.  Baseline:  Goal status: INITIAL   PLAN:  PT FREQUENCY: 2x/week  PT DURATION: 6 weeks  PLANNED  INTERVENTIONS: [id number]- PT Re-evaluation, 97110-Therapeutic exercises, 97530- Therapeutic activity, 97535- Self Care, [id number]- Manual therapy, Patient/Family education, Balance training, Taping, Dry Needling, Joint mobilization, Therapeutic exercises, Therapeutic activity, and Self Care  PLAN FOR NEXT SESSION: reassess Rt ROM and continue AAROM/PROM, Rt trunk STM, MLD/desensitization, cupping, taping (8 weeks post op chest surgery 12/23/22 - lat flap is much older)  Idamae Lusher, PT 12/23/2022, 6:20 PM

## 2022-12-27 ENCOUNTER — Encounter: Payer: Managed Care, Other (non HMO) | Admitting: Student

## 2022-12-27 ENCOUNTER — Ambulatory Visit: Payer: Commercial Managed Care - HMO | Admitting: Rehabilitation

## 2022-12-28 ENCOUNTER — Ambulatory Visit: Payer: Commercial Managed Care - HMO | Admitting: Rehabilitation

## 2022-12-28 ENCOUNTER — Encounter: Payer: Self-pay | Admitting: Plastic Surgery

## 2022-12-28 ENCOUNTER — Ambulatory Visit: Payer: Managed Care, Other (non HMO) | Admitting: Plastic Surgery

## 2022-12-28 ENCOUNTER — Encounter: Payer: Self-pay | Admitting: Rehabilitation

## 2022-12-28 VITALS — BP 120/83 | HR 78

## 2022-12-28 DIAGNOSIS — C50512 Malignant neoplasm of lower-outer quadrant of left female breast: Secondary | ICD-10-CM

## 2022-12-28 DIAGNOSIS — M25611 Stiffness of right shoulder, not elsewhere classified: Secondary | ICD-10-CM | POA: Diagnosis not present

## 2022-12-28 DIAGNOSIS — Z483 Aftercare following surgery for neoplasm: Secondary | ICD-10-CM

## 2022-12-28 DIAGNOSIS — Z171 Estrogen receptor negative status [ER-]: Secondary | ICD-10-CM

## 2022-12-28 DIAGNOSIS — Z9011 Acquired absence of right breast and nipple: Secondary | ICD-10-CM

## 2022-12-28 DIAGNOSIS — N644 Mastodynia: Secondary | ICD-10-CM

## 2022-12-28 NOTE — Therapy (Signed)
OUTPATIENT PHYSICAL THERAPY  UPPER EXTREMITY ONCOLOGY TREATMENT  Patient Name: Julie Townsend MRN: [id number] DOB:[birth year], 57 y.o., female Today's Date: 12/28/2022  END OF SESSION:  PT End of Session - 12/28/22 1256     Visit Number 8    Number of Visits 13    Date for PT Re-Evaluation 01/13/23    PT Start Time 1204    PT Stop Time 1257    PT Time Calculation (min) 53 min    Activity Tolerance Patient tolerated treatment well    Behavior During Therapy [facility] for tasks assessed/performed                 Past Medical History:  Diagnosis Date   BRCA2 gene mutation positive 09/26/2021   Breast cancer (HCC) 09/2021   left breast IDC   Family history of breast cancer 09/22/2021   Family history of colon cancer    Family history of ovarian cancer 09/22/2021   Family history of pancreatic cancer 09/22/2021   Family history of prostate cancer    Genetic testing 09/26/2021   Pathogenic variant in BRCA2 at p.G4010* (c.3922G>T).  Report date is September 26, 2021.    The BRCAplus panel offered by [employer] and includes sequencing and deletion/duplication analysis for the following 8 genes: ATM, BRCA1, BRCA2, CDH1, CHEK2, PALB2, PTEN, and TP53.  Results of pan-cancer panel are pending.     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    correct Hands   Port-A-Cath in place 03/10/2022   Past Surgical History:  Procedure Laterality Date   BREAST ENHANCEMENT SURGERY Bilateral 10/28/2022   BREAST IMPLANT REMOVAL Right 12/17/2021   Procedure: Right breast expander removal with washout;  Surgeon: Peggye Form, DO;  Location: [facility];  Service: Plastics;  Laterality: Right;   BREAST RECONSTRUCTION WITH PLACEMENT OF TISSUE EXPANDER AND FLEX HD (ACELLULAR HYDRATED DERMIS) Bilateral 10/29/2021   Procedure: BREAST RECONSTRUCTION WITH PLACEMENT OF TISSUE EXPANDER AND FLEX HD (ACELLULAR HYDRATED DERMIS);  Surgeon: Peggye Form, DO;  Location: [facility];  Service: Plastics;  Laterality: Bilateral;   BREAST RECONSTRUCTION WITH PLACEMENT OF TISSUE EXPANDER AND FLEX HD (ACELLULAR HYDRATED DERMIS) Right 07/26/2022   Procedure: PLACEMENT OF TISSUE EXPANDER;  Surgeon: Peggye Form, DO;  Location: [facility];  Service: Plastics;  Laterality: Right;   CARPAL TUNNEL RELEASE Right 06/20/2020   Procedure: CARPAL TUNNEL RELEASE;  Surgeon: Betha Loa, MD;  Location: [facility];  Service: Orthopedics;  Laterality: Right;   DILATION AND CURETTAGE OF UTERUS     IRRIGATION AND DEBRIDEMENT OF WOUND WITH SPLIT THICKNESS SKIN GRAFT Right 03/15/2022   Procedure: Debridement of right breast;  Surgeon: Peggye Form, DO;  Location: [facility];  Service: Plastics;  Laterality: Right;   LATISSIMUS FLAP TO BREAST Right 07/26/2022   Procedure: Right breast reconstruction and latissimus muscle flap;  Surgeon: Peggye Form, DO;  Location: [facility];  Service: Plastics;  Laterality: Right;   MASTECTOMY     MASTECTOMY W/ SENTINEL NODE BIOPSY Left 10/29/2021   Procedure: LEFT MASTECTOMY WITH LEFT SENTINEL LYMPH NODE BIOPSY;  Surgeon: Abigail Miyamoto, MD;  Location: [facility];  Service: General;  Laterality: Left;   PORT-A-CATH REMOVAL N/A 07/26/2022   Procedure: PORT-A-CATH REMOVAL;  Surgeon: Abigail Miyamoto, MD;  Location: [facility];  Service: General;  Laterality: N/A;   PORTACATH PLACEMENT Right 10/29/2021   Procedure: INSERTION PORT-A-CATH;  Surgeon: Abigail Miyamoto, MD;  Location: [facility];  Service: General;  Laterality: Right;   REMOVAL OF TISSUE EXPANDER AND PLACEMENT OF IMPLANT Bilateral 10/28/2022   Procedure: REMOVAL OF TISSUE EXPANDER AND PLACEMENT OF IMPLANT,  repair of tissue defect to right lateral chest wall;  Surgeon: Peggye Form, DO;  Location: [facility];  Service: Plastics;  Laterality: Bilateral;   ROBOTIC ASSISTED SALPINGO  OOPHERECTOMY Bilateral 09/15/2022   Procedure: XI ROBOTIC ASSISTED SALPINGO OOPHORECTOMY;  Surgeon: Carver Fila, MD;  Location: [facility];  Service: Gynecology;  Laterality: Bilateral;   TOTAL MASTECTOMY Right 10/29/2021   Procedure: RIGHT TOTAL MASTECTOMY;  Surgeon: Abigail Miyamoto, MD;  Location: [facility];  Service: General;  Laterality: Right;   TRIGGER FINGER RELEASE Left 05/08/2020   Procedure: LEFT LONG FINGER TRIGGER RELEASE AND LEFT RING FINGER TRIGGER RELEASE;  Surgeon: Betha Loa, MD;  Location: [facility];  Service: Orthopedics;  Laterality: Left;   trigger finger release rt hand     Patient Active Problem List   Diagnosis Date Noted   Acquired absence of right breast 07/26/2022   Cellulitis of right breast 03/10/2022   Medication management 03/10/2022   Breast cancer (HCC) 10/29/2021   BRCA2 gene mutation positive 09/26/2021   Genetic testing 09/26/2021   Family history of breast cancer 09/22/2021   Family history of pancreatic cancer 09/22/2021   Family history of ovarian cancer 09/22/2021   Malignant neoplasm of lower-outer quadrant of left breast of female, estrogen receptor negative (HCC) 09/11/2021    PCP: None  REFERRING PROVIDER: Caroline More PA-C  REFERRING DIAG:  Diagnosis  Z98.890 (ICD-10-CM) - S/P breast reconstruction    THERAPY DIAG:  Stiffness of right shoulder, not elsewhere classified  Breast pain, right  Aftercare following surgery for neoplasm  Malignant neoplasm of lower-outer quadrant of left breast of female, estrogen receptor negative (HCC)  ONSET DATE: 10/29/21  Rationale for Evaluation and Treatment: Rehabilitation  SUBJECTIVE:                                                                                                                                                                                           SUBJECTIVE STATEMENT:  Nothing new. I see Dr. Ulice Bold today.   PERTINENT  HISTORY: She most recently underwent bilateral exchange of tissue expanders for implants and bilateral capsulotomies for implant repositioning with Dr. Ulice Bold on 10/28/2022. Lt breast cancer 2023 with bilateral mastectomy 10/29/21 with 5 negative nodes. 12/17/21 - removal of Rt expander with washout. 07/26/22 - Rt lat flap and reconstruction with another expander. The Right side has a hx of 3 staph infections.  Completed chemo with ACT.  No radiation.    PAIN:  Are you having pain? No pt reports it is only painful to touch  PRECAUTIONS: Lt lymphedema risk    RED FLAGS: None   WEIGHT BEARING RESTRICTIONS: No  FALLS:  Has patient fallen in last 6 months? No  LIVING ENVIRONMENT: Lives with: lives with their family, lives with their spouse, and lives with their daughter  OCCUPATION: work at home.  It will start to become painful at my desk.  I have to learn how to take more breaks.    LEISURE: gardening, crocheting, I am not supposed to lift more than 4# until 12/09/22 for activity restrictions, I normally lift at home - up to 25#.    HAND DOMINANCE: right   PRIOR LEVEL OF FUNCTION: Independent  PATIENT GOALS: get back to normal.     OBJECTIVE: Note: Objective measures were completed at Evaluation unless otherwise noted.  COGNITION: Overall cognitive status: Within functional limits for tasks assessed   PALPATION: Very tender to light palpation Rt breast, trunk, chest wall and around lat incision.  Scar tissue thicker Rt lateral breast   OBSERVATIONS / OTHER ASSESSMENTS: well healed incisions, increased scar tissue Rt breast  POSTURE: WNL  UPPER EXTREMITY AROM/PROM:  A/PROM RIGHT   eval  RIGHT 12/08/22 12/21/22 12/28/22  Shoulder extension 60     Shoulder flexion 140 - pull 149 with pull 150 pull   Shoulder abduction 125 - pect pull 128 with pull 130 pull    Shoulder internal rotation      Shoulder external rotation 100       (Blank rows = not tested)  A/PROM LEFT    eval  Shoulder extension 60  Shoulder flexion 165  Shoulder abduction 165  Shoulder internal rotation   Shoulder external rotation 105    (Blank rows = not tested)  CERVICAL AROM: All within normal limits:   UPPER EXTREMITY STRENGTH:   L-DEX LYMPHEDEMA SCREENING: The patient was assessed using the L-Dex machine today to produce a lymphedema index baseline score. The patient will be reassessed on a regular basis (typically every 3 months) to obtain new L-Dex scores. If the score is > 6.5 points away from his/her baseline score indicating onset of subclinical lymphedema, it will be recommended to wear a compression garment for 4 weeks, 12 hours per day and then be reassessed. If the score continues to be > 6.5 points from baseline at reassessment, we will initiate lymphedema treatment. Assessing in this manner has a 95% rate of preventing clinically significant lymphedema.  QUICK DASH SURVEY: 22% limited from 18% baseline    TODAY'S TREATMENT:                                                                                                                                          DATE:  12/28/22 Pulleys x 2 min in direction  of flexion and abduction for dynamic warm up  Yellow band supine scap x 10 each horizontal abduction, ER bil x 10, diagonals x 10bil Standing 2# flexion and abduction In supine:  scar mobilization/STM to Rt breast surrounding tissue, not on implant, to work on desensitization and flexibility, scar mob at drain site without jumpiness today. Normal pressure used.  Then in sidelying: STM Lt lat and QL region  12/23/22 Pulleys x 2 min in direction of flexion and abduction for dynamic warm up  Yellow band supine scap x 5 each horizontal abduction, ER bil x 5, diagonals x 5 bil all with vcs reminder of all movements.   In supine:  scar mobilization/STM to Rt breast surrounding tissue, not on implant, to work on desensitization and flexibility, scar mob at drain site  without jumpiness today. Normal pressure used. Wave tool small head around the drain scars Then in sidelying: STM Lt lat and QL region  12/21/22 Pulleys x 2 min in direction of flexion and abduction for dynamic warm up  Supine over 1/2 foam roll with arms outstretched x 2 min, then adding breathing into the tight rib section on the Rt x 5, snow angel,  then alternating flexion x 10 reps Yellow band supine scap x 5 each horizontal abduction, ER bil x 5, diagonals x 5 bil all with initial instruction.   In supine:  scar mobilization/STM to Rt breast surrounding tissue, not on implant to work on desensitization and flexibility, scar mob at drain site without jumpiness today. Normal pressure used.   PATIENT EDUCATION:  Education details: per today's note Person educated: Patient Education method: Explanation, Demonstration, Actor cues, Verbal cues, and Handouts Education comprehension: verbalized understanding  HOME EXERCISE PROGRAM: Access Code: [email or web address] URL: [email or web address] Date: 12/02/2022 Prepared by: Gwenevere Abbot  Exercises - Supine Lower Trunk Rotation  - 1 x daily - 7 x weekly - 1-3 sets - 10 reps - 20-30 seconds hold - Supine Shoulder Flexion Extension AAROM with Dowel  - 1-2 x daily - 7 x weekly - 10 reps - 5 seconds hold - Standing Shoulder Flexion Wall Walk  - 1 x daily - 7 x weekly - 1-3 sets - 10 reps - 3-4 sec hold  ASSESSMENT:  CLINICAL IMPRESSION: continued POC.  Only pain was at lat insertion briefly. pt reports no real pain lately.   OBJECTIVE IMPAIRMENTS: decreased activity tolerance, decreased mobility, decreased ROM, decreased strength, and increased fascial restrictions.   ACTIVITY LIMITATIONS: carrying and lifting  PARTICIPATION LIMITATIONS: community activity and yard work  PERSONAL FACTORS: surgical hx are also affecting patient's functional outcome.   REHAB POTENTIAL: Good  CLINICAL DECISION MAKING: Stable/uncomplicated  EVALUATION  COMPLEXITY: Low  GOALS: Goals reviewed with patient? Yes  SHORT TERM GOALS: Target date: 12/17/22  Pt will improve Rt AROM by at least 15 degrees to demonstrate improved mobility. Baseline: Flex; 150, Abd: 125 on eval  Goal status: INITIAL  2.  Pt will report resting breast pain of 6/10 or less Baseline:  Goal status: INITIAL   LONG TERM GOALS: Target date: 01/16/23  Pt will return to gardening and weight lifting activities without limitations  Baseline:  Goal status: INITIAL  2.  Pt will improve AROM to equal to the left side to improve mobility  Baseline:  Goal status: INITIAL  3.  Pt will tolerate normal STM pressure to demonstrate less sensitivity.  Baseline:  Goal status: INITIAL   PLAN:  PT FREQUENCY: 2x/week  PT DURATION: 6 weeks  PLANNED INTERVENTIONS:  [id number]- PT Re-evaluation, 97110-Therapeutic exercises, 97530- Therapeutic activity, 97535- Self Care, [id number]- Manual therapy, Patient/Family education, Balance training, Taping, Dry Needling, Joint mobilization, Therapeutic exercises, Therapeutic activity, and Self Care  PLAN FOR NEXT SESSION: reassess Rt ROM and continue AAROM/PROM, Rt trunk STM, MLD/desensitization, cupping, taping (8 weeks post op chest surgery 12/23/22 - lat flap is much older)  Idamae Lusher, PT 12/28/2022, 12:57 PM

## 2022-12-28 NOTE — Progress Notes (Signed)
   Subjective:    Patient ID: Julie Townsend, female    DOB: 1965/11/23, 57 y.o.   MRN: [id number]  The patient is a 57 year old female here with her husband for follow-up on her bilateral breast reconstruction.  Her last surgery was in September 2024.  She has bilateral implants with a right latissimus myocutaneous flap.  She has had some challenges with at the surgeries.  Right now she is doing really well.  She has some asymmetry with some volume loss.  The volume loss is noted in the upper medial pole of the left breast and the upper medial and lateral pole of the right breast.  She also has some fullness in the right breast laterally.  Describes the right breast as looking like a cone.  The no issues with the skin noted today.      Review of Systems  Constitutional: Negative.   Eyes: Negative.   Respiratory: Negative.    Cardiovascular: Negative.   Gastrointestinal: Negative.   Genitourinary: Negative.        Objective:   Physical Exam Constitutional:      Appearance: Normal appearance.  Cardiovascular:     Rate and Rhythm: Normal rate.     Pulses: Normal pulses.  Skin:    General: Skin is warm.     Capillary Refill: Capillary refill takes less than 2 seconds.     Coloration: Skin is not jaundiced.     Findings: No bruising.  Neurological:     Mental Status: She is oriented to person, place, and time.  Psychiatric:        Mood and Affect: Mood normal.        Behavior: Behavior normal.        Thought Content: Thought content normal.        Judgment: Judgment normal.        Assessment & Plan:     ICD-10-CM   1. Acquired absence of right breast  Z90.11     2. Malignant neoplasm of lower-outer quadrant of left breast of female, estrogen receptor negative (HCC)  C50.512    Z17.1        Likely will be able to do some fat filling of bilateral breast with reduction of fullness of the right lateral breast and tightening up of the skin.  The patient is going to  get through the holiday and then we will talk again in January.  Pictures were obtained of the patient and placed in the chart with the patient's or guardian's permission.

## 2023-01-04 ENCOUNTER — Ambulatory Visit: Payer: Commercial Managed Care - HMO

## 2023-01-04 DIAGNOSIS — C50412 Malignant neoplasm of upper-outer quadrant of left female breast: Secondary | ICD-10-CM | POA: Insufficient documentation

## 2023-01-04 DIAGNOSIS — C50512 Malignant neoplasm of lower-outer quadrant of left female breast: Secondary | ICD-10-CM | POA: Insufficient documentation

## 2023-01-04 DIAGNOSIS — R293 Abnormal posture: Secondary | ICD-10-CM | POA: Insufficient documentation

## 2023-01-04 DIAGNOSIS — M25611 Stiffness of right shoulder, not elsewhere classified: Secondary | ICD-10-CM | POA: Insufficient documentation

## 2023-01-04 DIAGNOSIS — Z171 Estrogen receptor negative status [ER-]: Secondary | ICD-10-CM | POA: Insufficient documentation

## 2023-01-04 DIAGNOSIS — N644 Mastodynia: Secondary | ICD-10-CM | POA: Insufficient documentation

## 2023-01-04 DIAGNOSIS — Z483 Aftercare following surgery for neoplasm: Secondary | ICD-10-CM | POA: Insufficient documentation

## 2023-01-04 NOTE — Therapy (Signed)
OUTPATIENT PHYSICAL THERAPY  UPPER EXTREMITY ONCOLOGY TREATMENT  Patient Name: Julie Townsend MRN: [id number] DOB:Aug 06, 1965, 57 y.o., female Today's Date: 01/04/2023  END OF SESSION:  PT End of Session - 01/04/23 1319     Visit Number 8   no treatment, so no change   Number of Visits 13    Date for PT Re-Evaluation 01/13/23    PT Start Time 1304    PT Stop Time 1306    PT Time Calculation (min) 2 min                 Past Medical History:  Diagnosis Date   BRCA2 gene mutation positive 09/26/2021   Breast cancer (HCC) 09/2021   left breast IDC   Family history of breast cancer 09/22/2021   Family history of colon cancer    Family history of ovarian cancer 09/22/2021   Family history of pancreatic cancer 09/22/2021   Family history of prostate cancer    Genetic testing 09/26/2021   Pathogenic variant in BRCA2 at p.Y3016* (c.3922G>T).  Report date is September 26, 2021.    The BRCAplus panel offered by [employer] and includes sequencing and deletion/duplication analysis for the following 8 genes: ATM, BRCA1, BRCA2, CDH1, CHEK2, PALB2, PTEN, and TP53.  Results of pan-cancer panel are pending.     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    correct Hands   Port-A-Cath in place 03/10/2022   Past Surgical History:  Procedure Laterality Date   BREAST ENHANCEMENT SURGERY Bilateral 10/28/2022   BREAST IMPLANT REMOVAL Right 12/17/2021   Procedure: Right breast expander removal with washout;  Surgeon: Peggye Form, DO;  Location: [facility];  Service: Plastics;  Laterality: Right;   BREAST RECONSTRUCTION WITH PLACEMENT OF TISSUE EXPANDER AND FLEX HD (ACELLULAR HYDRATED DERMIS) Bilateral 10/29/2021   Procedure: BREAST RECONSTRUCTION WITH PLACEMENT OF TISSUE EXPANDER AND FLEX HD (ACELLULAR HYDRATED DERMIS);  Surgeon: Peggye Form, DO;  Location: [facility];  Service: Plastics;  Laterality: Bilateral;    BREAST RECONSTRUCTION WITH PLACEMENT OF TISSUE EXPANDER AND FLEX HD (ACELLULAR HYDRATED DERMIS) Right 07/26/2022   Procedure: PLACEMENT OF TISSUE EXPANDER;  Surgeon: Peggye Form, DO;  Location: [facility];  Service: Plastics;  Laterality: Right;   CARPAL TUNNEL RELEASE Right 06/20/2020   Procedure: CARPAL TUNNEL RELEASE;  Surgeon: Betha Loa, MD;  Location: [facility];  Service: Orthopedics;  Laterality: Right;   DILATION AND CURETTAGE OF UTERUS     IRRIGATION AND DEBRIDEMENT OF WOUND WITH SPLIT THICKNESS SKIN GRAFT Right 03/15/2022   Procedure: Debridement of right breast;  Surgeon: Peggye Form, DO;  Location: [facility];  Service: Plastics;  Laterality: Right;   LATISSIMUS FLAP TO BREAST Right 07/26/2022   Procedure: Right breast reconstruction and latissimus muscle flap;  Surgeon: Peggye Form, DO;  Location: [facility];  Service: Plastics;  Laterality: Right;   MASTECTOMY     MASTECTOMY W/ SENTINEL NODE BIOPSY Left 10/29/2021   Procedure: LEFT MASTECTOMY WITH LEFT SENTINEL LYMPH NODE BIOPSY;  Surgeon: Abigail Miyamoto, MD;  Location: [facility];  Service: General;  Laterality: Left;   PORT-A-CATH REMOVAL N/A 07/26/2022   Procedure: PORT-A-CATH REMOVAL;  Surgeon: Abigail Miyamoto, MD;  Location: [facility];  Service: General;  Laterality: N/A;   PORTACATH PLACEMENT Right 10/29/2021   Procedure: INSERTION PORT-A-CATH;  Surgeon: Abigail Miyamoto, MD;  Location: [facility];  Service: General;  Laterality: Right;  REMOVAL OF TISSUE EXPANDER AND PLACEMENT OF IMPLANT Bilateral 10/28/2022   Procedure: REMOVAL OF TISSUE EXPANDER AND PLACEMENT OF IMPLANT,  repair of tissue defect to right lateral chest wall;  Surgeon: Peggye Form, DO;  Location: [facility];  Service: Plastics;  Laterality: Bilateral;   ROBOTIC ASSISTED SALPINGO OOPHERECTOMY Bilateral 09/15/2022   Procedure: XI ROBOTIC ASSISTED SALPINGO OOPHORECTOMY;   Surgeon: Carver Fila, MD;  Location: [facility];  Service: Gynecology;  Laterality: Bilateral;   TOTAL MASTECTOMY Right 10/29/2021   Procedure: RIGHT TOTAL MASTECTOMY;  Surgeon: Abigail Miyamoto, MD;  Location: [facility];  Service: General;  Laterality: Right;   TRIGGER FINGER RELEASE Left 05/08/2020   Procedure: LEFT LONG FINGER TRIGGER RELEASE AND LEFT RING FINGER TRIGGER RELEASE;  Surgeon: Betha Loa, MD;  Location: [facility];  Service: Orthopedics;  Laterality: Left;   trigger finger release rt hand     Patient Active Problem List   Diagnosis Date Noted   Acquired absence of right breast 07/26/2022   Cellulitis of right breast 03/10/2022   Medication management 03/10/2022   Breast cancer (HCC) 10/29/2021   BRCA2 gene mutation positive 09/26/2021   Genetic testing 09/26/2021   Family history of breast cancer 09/22/2021   Family history of pancreatic cancer 09/22/2021   Family history of ovarian cancer 09/22/2021   Malignant neoplasm of lower-outer quadrant of left breast of female, estrogen receptor negative (HCC) 09/11/2021    PCP: None  REFERRING PROVIDER: Caroline More PA-C  REFERRING DIAG:  Diagnosis  Z98.890 (ICD-10-CM) - S/P breast reconstruction    THERAPY DIAG:  Stiffness of right shoulder, not elsewhere classified  Breast pain, right  Aftercare following surgery for neoplasm  Malignant neoplasm of lower-outer quadrant of left breast of female, estrogen receptor negative (HCC)  ONSET DATE: 10/29/21  Rationale for Evaluation and Treatment: Rehabilitation  SUBJECTIVE:                                                                                                                                                                                           SUBJECTIVE STATEMENT:  Nothing new. I see Dr. Ulice Bold today.   PERTINENT HISTORY: She most recently underwent bilateral exchange of tissue expanders for implants and  bilateral capsulotomies for implant repositioning with Dr. Ulice Bold on 10/28/2022. Lt breast cancer 2023 with bilateral mastectomy 10/29/21 with 5 negative nodes. 12/17/21 - removal of Rt expander with washout. 07/26/22 - Rt lat flap and reconstruction with another expander. The Right side has a hx of 3 staph infections.   Completed chemo with ACT.  No radiation.    PAIN:  Are  you having pain? No pt reports it is only painful to touch  PRECAUTIONS: Lt lymphedema risk    RED FLAGS: None   WEIGHT BEARING RESTRICTIONS: No  FALLS:  Has patient fallen in last 6 months? No  LIVING ENVIRONMENT: Lives with: lives with their family, lives with their spouse, and lives with their daughter  OCCUPATION: work at home.  It will start to become painful at my desk.  I have to learn how to take more breaks.    LEISURE: gardening, crocheting, I am not supposed to lift more than 4# until 12/09/22 for activity restrictions, I normally lift at home - up to 25#.    HAND DOMINANCE: right   PRIOR LEVEL OF FUNCTION: Independent  PATIENT GOALS: get back to normal.     OBJECTIVE: Note: Objective measures were completed at Evaluation unless otherwise noted.  COGNITION: Overall cognitive status: Within functional limits for tasks assessed   PALPATION: Very tender to light palpation Rt breast, trunk, chest wall and around lat incision.  Scar tissue thicker Rt lateral breast   OBSERVATIONS / OTHER ASSESSMENTS: well healed incisions, increased scar tissue Rt breast  POSTURE: WNL  UPPER EXTREMITY AROM/PROM:  A/PROM RIGHT   eval  RIGHT 12/08/22 12/21/22 12/28/22  Shoulder extension 60     Shoulder flexion 140 - pull 149 with pull 150 pull   Shoulder abduction 125 - pect pull 128 with pull 130 pull    Shoulder internal rotation      Shoulder external rotation 100       (Blank rows = not tested)  A/PROM LEFT   eval  Shoulder extension 60  Shoulder flexion 165  Shoulder abduction 165  Shoulder  internal rotation   Shoulder external rotation 105    (Blank rows = not tested)  CERVICAL AROM: All within normal limits:   UPPER EXTREMITY STRENGTH:   L-DEX LYMPHEDEMA SCREENING: The patient was assessed using the L-Dex machine today to produce a lymphedema index baseline score. The patient will be reassessed on a regular basis (typically every 3 months) to obtain new L-Dex scores. If the score is > 6.5 points away from his/her baseline score indicating onset of subclinical lymphedema, it will be recommended to wear a compression garment for 4 weeks, 12 hours per day and then be reassessed. If the score continues to be > 6.5 points from baseline at reassessment, we will initiate lymphedema treatment. Assessing in this manner has a 95% rate of preventing clinically significant lymphedema.  QUICK DASH SURVEY: 22% limited from 18% baseline    TODAY'S TREATMENT:                                                                                                                                          DATE:  01/04/23: See Assessment  12/28/22 Pulleys x 2 min in direction of flexion and abduction for dynamic warm up  Yellow band supine scap x 10 each horizontal abduction, ER bil x 10, diagonals x 10bil Standing 2# flexion and abduction In supine:  scar mobilization/STM to Rt breast surrounding tissue, not on implant, to work on desensitization and flexibility, scar mob at drain site without jumpiness today. Normal pressure used.  Then in sidelying: STM Lt lat and QL region  12/23/22 Pulleys x 2 min in direction of flexion and abduction for dynamic warm up  Yellow band supine scap x 5 each horizontal abduction, ER bil x 5, diagonals x 5 bil all with vcs reminder of all movements.   In supine:  scar mobilization/STM to Rt breast surrounding tissue, not on implant, to work on desensitization and flexibility, scar mob at drain site without jumpiness today. Normal pressure used. Wave tool small  head around the drain scars Then in sidelying: STM Lt lat and QL region  12/21/22 Pulleys x 2 min in direction of flexion and abduction for dynamic warm up  Supine over 1/2 foam roll with arms outstretched x 2 min, then adding breathing into the tight rib section on the Rt x 5, snow angel,  then alternating flexion x 10 reps Yellow band supine scap x 5 each horizontal abduction, ER bil x 5, diagonals x 5 bil all with initial instruction.   In supine:  scar mobilization/STM to Rt breast surrounding tissue, not on implant to work on desensitization and flexibility, scar mob at drain site without jumpiness today. Normal pressure used.   PATIENT EDUCATION:  Education details: per today's note Person educated: Patient Education method: Explanation, Demonstration, Actor cues, Verbal cues, and Handouts Education comprehension: verbalized understanding  HOME EXERCISE PROGRAM: Access Code: [email or web address] URL: [email or web address] Date: 12/02/2022 Prepared by: Gwenevere Abbot  Exercises - Supine Lower Trunk Rotation  - 1 x daily - 7 x weekly - 1-3 sets - 10 reps - 20-30 seconds hold - Supine Shoulder Flexion Extension AAROM with Dowel  - 1-2 x daily - 7 x weekly - 10 reps - 5 seconds hold - Standing Shoulder Flexion Wall Walk  - 1 x daily - 7 x weekly - 1-3 sets - 10 reps - 3-4 sec hold  ASSESSMENT:  CLINICAL IMPRESSION: Pt arrived for session but due to [place] having to change providers last minute due to weather pt did not want to be seen by another therapist so cancelled session.   OBJECTIVE IMPAIRMENTS: decreased activity tolerance, decreased mobility, decreased ROM, decreased strength, and increased fascial restrictions.   ACTIVITY LIMITATIONS: carrying and lifting  PARTICIPATION LIMITATIONS: community activity and yard work  PERSONAL FACTORS: surgical hx are also affecting patient's functional outcome.   REHAB POTENTIAL: Good  CLINICAL DECISION MAKING:  Stable/uncomplicated  EVALUATION COMPLEXITY: Low  GOALS: Goals reviewed with patient? Yes  SHORT TERM GOALS: Target date: 12/17/22  Pt will improve Rt AROM by at least 15 degrees to demonstrate improved mobility. Baseline: Flex; 150, Abd: 125 on eval  Goal status: INITIAL  2.  Pt will report resting breast pain of 6/10 or less Baseline:  Goal status: INITIAL   LONG TERM GOALS: Target date: 01/16/23  Pt will return to gardening and weight lifting activities without limitations  Baseline:  Goal status: INITIAL  2.  Pt will improve AROM to equal to the left side to improve mobility  Baseline:  Goal status: INITIAL  3.  Pt will tolerate normal STM pressure to demonstrate less sensitivity.  Baseline:  Goal status: INITIAL   PLAN:  PT FREQUENCY: 2x/week  PT DURATION:  6 weeks  PLANNED INTERVENTIONS: 97164- PT Re-evaluation, 97110-Therapeutic exercises, 97530- Therapeutic activity, 97535- Self Care, [id number]- Manual therapy, Patient/Family education, Balance training, Taping, Dry Needling, Joint mobilization, Therapeutic exercises, Therapeutic activity, and Self Care  PLAN FOR NEXT SESSION: reassess Rt ROM and continue AAROM/PROM, Rt trunk STM, MLD/desensitization, cupping, taping (8 weeks post op chest surgery 12/23/22 - lat flap is much older)  Hermenia Bers, PTA 01/04/2023, 1:25 PM

## 2023-01-06 ENCOUNTER — Encounter: Payer: Self-pay | Admitting: Rehabilitation

## 2023-01-06 ENCOUNTER — Ambulatory Visit: Payer: Commercial Managed Care - HMO | Attending: Student | Admitting: Rehabilitation

## 2023-01-06 DIAGNOSIS — C50512 Malignant neoplasm of lower-outer quadrant of left female breast: Secondary | ICD-10-CM | POA: Diagnosis present

## 2023-01-06 DIAGNOSIS — M25611 Stiffness of right shoulder, not elsewhere classified: Secondary | ICD-10-CM

## 2023-01-06 DIAGNOSIS — N644 Mastodynia: Secondary | ICD-10-CM | POA: Diagnosis present

## 2023-01-06 DIAGNOSIS — Z483 Aftercare following surgery for neoplasm: Secondary | ICD-10-CM | POA: Diagnosis present

## 2023-01-06 DIAGNOSIS — C50412 Malignant neoplasm of upper-outer quadrant of left female breast: Secondary | ICD-10-CM | POA: Diagnosis present

## 2023-01-06 DIAGNOSIS — R293 Abnormal posture: Secondary | ICD-10-CM | POA: Diagnosis present

## 2023-01-06 DIAGNOSIS — Z171 Estrogen receptor negative status [ER-]: Secondary | ICD-10-CM | POA: Diagnosis present

## 2023-01-06 NOTE — Therapy (Signed)
OUTPATIENT PHYSICAL THERAPY  UPPER EXTREMITY ONCOLOGY TREATMENT  Patient Name: Julie Townsend MRN: [id number] DOB:Apr 15, 1965, 57 y.o., female Today's Date: 01/10/2023  END OF SESSION:  PT End of Session - 01/10/23 0820     Visit Number 9    Number of Visits 13    Date for PT Re-Evaluation 01/13/23    PT Start Time 1300    PT Stop Time 1355    PT Time Calculation (min) 55 min    Activity Tolerance Patient tolerated treatment well    Behavior During Therapy [facility] for tasks assessed/performed                  Past Medical History:  Diagnosis Date   BRCA2 gene mutation positive 09/26/2021   Breast cancer (HCC) 09/2021   left breast IDC   Family history of breast cancer 09/22/2021   Family history of colon cancer    Family history of ovarian cancer 09/22/2021   Family history of pancreatic cancer 09/22/2021   Family history of prostate cancer    Genetic testing 09/26/2021   Pathogenic variant in BRCA2 at p.U1324* (c.3922G>T).  Report date is September 26, 2021.    The BRCAplus panel offered by [employer] and includes sequencing and deletion/duplication analysis for the following 8 genes: ATM, BRCA1, BRCA2, CDH1, CHEK2, PALB2, PTEN, and TP53.  Results of pan-cancer panel are pending.     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    correct Hands   Port-A-Cath in place 03/10/2022   Past Surgical History:  Procedure Laterality Date   BREAST ENHANCEMENT SURGERY Bilateral 10/28/2022   BREAST IMPLANT REMOVAL Right 12/17/2021   Procedure: Right breast expander removal with washout;  Surgeon: Peggye Form, DO;  Location: [facility];  Service: Plastics;  Laterality: Right;   BREAST RECONSTRUCTION WITH PLACEMENT OF TISSUE EXPANDER AND FLEX HD (ACELLULAR HYDRATED DERMIS) Bilateral 10/29/2021   Procedure: BREAST RECONSTRUCTION WITH PLACEMENT OF TISSUE EXPANDER AND FLEX HD (ACELLULAR HYDRATED DERMIS);  Surgeon: Peggye Form, DO;  Location: [facility];  Service: Plastics;  Laterality: Bilateral;   BREAST RECONSTRUCTION WITH PLACEMENT OF TISSUE EXPANDER AND FLEX HD (ACELLULAR HYDRATED DERMIS) Right 07/26/2022   Procedure: PLACEMENT OF TISSUE EXPANDER;  Surgeon: Peggye Form, DO;  Location: [facility];  Service: Plastics;  Laterality: Right;   CARPAL TUNNEL RELEASE Right 06/20/2020   Procedure: CARPAL TUNNEL RELEASE;  Surgeon: Betha Loa, MD;  Location: [facility];  Service: Orthopedics;  Laterality: Right;   DILATION AND CURETTAGE OF UTERUS     IRRIGATION AND DEBRIDEMENT OF WOUND WITH SPLIT THICKNESS SKIN GRAFT Right 03/15/2022   Procedure: Debridement of right breast;  Surgeon: Peggye Form, DO;  Location: [facility];  Service: Plastics;  Laterality: Right;   LATISSIMUS FLAP TO BREAST Right 07/26/2022   Procedure: Right breast reconstruction and latissimus muscle flap;  Surgeon: Peggye Form, DO;  Location: [facility];  Service: Plastics;  Laterality: Right;   MASTECTOMY     MASTECTOMY W/ SENTINEL NODE BIOPSY Left 10/29/2021   Procedure: LEFT MASTECTOMY WITH LEFT SENTINEL LYMPH NODE BIOPSY;  Surgeon: Abigail Miyamoto, MD;  Location: [facility];  Service: General;  Laterality: Left;   PORT-A-CATH REMOVAL N/A 07/26/2022   Procedure: PORT-A-CATH REMOVAL;  Surgeon: Abigail Miyamoto, MD;  Location: [facility];  Service: General;  Laterality: N/A;   PORTACATH PLACEMENT Right 10/29/2021   Procedure: INSERTION PORT-A-CATH;  Surgeon: Abigail Miyamoto, MD;  Location: [facility];  Service: General;  Laterality: Right;   REMOVAL OF TISSUE EXPANDER AND PLACEMENT OF IMPLANT Bilateral 10/28/2022   Procedure: REMOVAL OF TISSUE EXPANDER AND PLACEMENT OF IMPLANT,  repair of tissue defect to right lateral chest wall;  Surgeon: Peggye Form, DO;  Location: [facility];  Service: Plastics;  Laterality: Bilateral;   ROBOTIC ASSISTED SALPINGO  OOPHERECTOMY Bilateral 09/15/2022   Procedure: XI ROBOTIC ASSISTED SALPINGO OOPHORECTOMY;  Surgeon: Carver Fila, MD;  Location: [facility];  Service: Gynecology;  Laterality: Bilateral;   TOTAL MASTECTOMY Right 10/29/2021   Procedure: RIGHT TOTAL MASTECTOMY;  Surgeon: Abigail Miyamoto, MD;  Location: [facility];  Service: General;  Laterality: Right;   TRIGGER FINGER RELEASE Left 05/08/2020   Procedure: LEFT LONG FINGER TRIGGER RELEASE AND LEFT RING FINGER TRIGGER RELEASE;  Surgeon: Betha Loa, MD;  Location: [facility];  Service: Orthopedics;  Laterality: Left;   trigger finger release rt hand     Patient Active Problem List   Diagnosis Date Noted   Acquired absence of right breast 07/26/2022   Cellulitis of right breast 03/10/2022   Medication management 03/10/2022   Breast cancer (HCC) 10/29/2021   BRCA2 gene mutation positive 09/26/2021   Genetic testing 09/26/2021   Family history of breast cancer 09/22/2021   Family history of pancreatic cancer 09/22/2021   Family history of ovarian cancer 09/22/2021   Malignant neoplasm of lower-outer quadrant of left breast of female, estrogen receptor negative (HCC) 09/11/2021    PCP: None  REFERRING PROVIDER: Caroline More PA-C  REFERRING DIAG:  Diagnosis  Z98.890 (ICD-10-CM) - S/P breast reconstruction    THERAPY DIAG:  Stiffness of right shoulder, not elsewhere classified  Breast pain, right  Aftercare following surgery for neoplasm  Malignant neoplasm of lower-outer quadrant of left breast of female, estrogen receptor negative (HCC)  ONSET DATE: 10/29/21  Rationale for Evaluation and Treatment: Rehabilitation  SUBJECTIVE:                                                                                                                                                                                           SUBJECTIVE STATEMENT:  Dr. Ulice Bold says it is all skin and scar tissue.  We may  look in February of next year to try and correct.  I will do it.    PERTINENT HISTORY: She most recently underwent bilateral exchange of tissue expanders for implants and bilateral capsulotomies for implant repositioning with Dr. Ulice Bold on 10/28/2022. Lt breast cancer 2023 with bilateral mastectomy 10/29/21 with 5 negative nodes. 12/17/21 - removal of Rt expander with washout.  07/26/22 - Rt lat flap and reconstruction with another expander. The Right side has a hx of 3 staph infections.   Completed chemo with ACT.  No radiation.    PAIN:  Are you having pain? No pt reports it is only painful to touch  PRECAUTIONS: Lt lymphedema risk    RED FLAGS: None   WEIGHT BEARING RESTRICTIONS: No  FALLS:  Has patient fallen in last 6 months? No  LIVING ENVIRONMENT: Lives with: lives with their family, lives with their spouse, and lives with their daughter  OCCUPATION: work at home.  It will start to become painful at my desk.  I have to learn how to take more breaks.    LEISURE: gardening, crocheting, I am not supposed to lift more than 4# until 12/09/22 for activity restrictions, I normally lift at home - up to 25#.    HAND DOMINANCE: right   PRIOR LEVEL OF FUNCTION: Independent  PATIENT GOALS: get back to normal.     OBJECTIVE: Note: Objective measures were completed at Evaluation unless otherwise noted.  COGNITION: Overall cognitive status: Within functional limits for tasks assessed   PALPATION: Very tender to light palpation Rt breast, trunk, chest wall and around lat incision.  Scar tissue thicker Rt lateral breast   OBSERVATIONS / OTHER ASSESSMENTS: well healed incisions, increased scar tissue Rt breast  POSTURE: WNL  UPPER EXTREMITY AROM/PROM:  A/PROM RIGHT   eval  RIGHT 12/08/22 12/21/22 12/28/22  Shoulder extension 60     Shoulder flexion 140 - pull 149 with pull 150 pull   Shoulder abduction 125 - pect pull 128 with pull 130 pull    Shoulder internal rotation       Shoulder external rotation 100       (Blank rows = not tested)  A/PROM LEFT   eval  Shoulder extension 60  Shoulder flexion 165  Shoulder abduction 165  Shoulder internal rotation   Shoulder external rotation 105    (Blank rows = not tested)  CERVICAL AROM: All within normal limits:   UPPER EXTREMITY STRENGTH:   L-DEX LYMPHEDEMA SCREENING: The patient was assessed using the L-Dex machine today to produce a lymphedema index baseline score. The patient will be reassessed on a regular basis (typically every 3 months) to obtain new L-Dex scores. If the score is > 6.5 points away from his/her baseline score indicating onset of subclinical lymphedema, it will be recommended to wear a compression garment for 4 weeks, 12 hours per day and then be reassessed. If the score continues to be > 6.5 points from baseline at reassessment, we will initiate lymphedema treatment. Assessing in this manner has a 95% rate of preventing clinically significant lymphedema.  QUICK DASH SURVEY: 22% limited from 18% baseline    TODAY'S TREATMENT:  DATE:  01/06/23 Pulleys x 2 min in direction of flexion and abduction for dynamic warm up  Standing band Yellow scap x 10 each horizontal abduction, ER bil x 10, diagonals x 10bil Standing 2# flexion and abduction In supine:  scar mobilization/STM to Rt breast surrounding tissue, not on implant, to work on desensitization and flexibility, scar mob at drain site without jumpiness today. Normal pressure used.  Then in sidelying: STM Lt lat and QL region  12/28/22 Pulleys x 2 min in direction of flexion and abduction for dynamic warm up  Yellow band supine scap x 10 each horizontal abduction, ER bil x 10, diagonals x 10bil Standing 2# flexion and abduction In supine:  scar mobilization/STM to Rt breast surrounding tissue, not on  implant, to work on desensitization and flexibility, scar mob at drain site without jumpiness today. Normal pressure used.  Then in sidelying: STM Lt lat and QL region  12/23/22 Pulleys x 2 min in direction of flexion and abduction for dynamic warm up  Yellow band supine scap x 5 each horizontal abduction, ER bil x 5, diagonals x 5 bil all with vcs reminder of all movements.   In supine:  scar mobilization/STM to Rt breast surrounding tissue, not on implant, to work on desensitization and flexibility, scar mob at drain site without jumpiness today. Normal pressure used. Wave tool small head around the drain scars Then in sidelying: STM Lt lat and QL region  12/21/22 Pulleys x 2 min in direction of flexion and abduction for dynamic warm up  Supine over 1/2 foam roll with arms outstretched x 2 min, then adding breathing into the tight rib section on the Rt x 5, snow angel,  then alternating flexion x 10 reps Yellow band supine scap x 5 each horizontal abduction, ER bil x 5, diagonals x 5 bil all with initial instruction.   In supine:  scar mobilization/STM to Rt breast surrounding tissue, not on implant to work on desensitization and flexibility, scar mob at drain site without jumpiness today. Normal pressure used.   PATIENT EDUCATION:  Education details: per today's note Person educated: Patient Education method: Explanation, Demonstration, Actor cues, Verbal cues, and Handouts Education comprehension: verbalized understanding  HOME EXERCISE PROGRAM: Access Code: [email or web address] URL: [email or web address] Date: 12/02/2022 Prepared by: Gwenevere Abbot  Exercises - Supine Lower Trunk Rotation  - 1 x daily - 7 x weekly - 1-3 sets - 10 reps - 20-30 seconds hold - Supine Shoulder Flexion Extension AAROM with Dowel  - 1-2 x daily - 7 x weekly - 10 reps - 5 seconds hold - Standing Shoulder Flexion Wall Walk  - 1 x daily - 7 x weekly - 1-3 sets - 10 reps - 3-4 sec  hold  ASSESSMENT:  CLINICAL IMPRESSION: continued POC.  Only pain was at lat insertion briefly. pt reports no real pain lately.   OBJECTIVE IMPAIRMENTS: decreased activity tolerance, decreased mobility, decreased ROM, decreased strength, and increased fascial restrictions.   ACTIVITY LIMITATIONS: carrying and lifting  PARTICIPATION LIMITATIONS: community activity and yard work  PERSONAL FACTORS: surgical hx are also affecting patient's functional outcome.   REHAB POTENTIAL: Good  CLINICAL DECISION MAKING: Stable/uncomplicated  EVALUATION COMPLEXITY: Low  GOALS: Goals reviewed with patient? Yes  SHORT TERM GOALS: Target date: 12/17/22  Pt will improve Rt AROM by at least 15 degrees to demonstrate improved mobility. Baseline: Flex; 150, Abd: 125 on eval  Goal status: INITIAL  2.  Pt will report resting breast pain of 6/10 or less  Baseline:  Goal status: INITIAL   LONG TERM GOALS: Target date: 01/16/23  Pt will return to gardening and weight lifting activities without limitations  Baseline:  Goal status: INITIAL  2.  Pt will improve AROM to equal to the left side to improve mobility  Baseline:  Goal status: INITIAL  3.  Pt will tolerate normal STM pressure to demonstrate less sensitivity.  Baseline:  Goal status: INITIAL   PLAN:  PT FREQUENCY: 2x/week  PT DURATION: 6 weeks  PLANNED INTERVENTIONS: [id number]- PT Re-evaluation, 97110-Therapeutic exercises, 97530- Therapeutic activity, 97535- Self Care, [id number]- Manual therapy, Patient/Family education, Balance training, Taping, Dry Needling, Joint mobilization, Therapeutic exercises, Therapeutic activity, and Self Care  PLAN FOR NEXT SESSION: reassess Rt ROM and continue AAROM/PROM, Rt trunk STM, MLD/desensitization, cupping, taping (8 weeks post op chest surgery 12/23/22 - lat flap is much older)  Idamae Lusher, PT 01/10/2023, 8:20 AM

## 2023-01-11 ENCOUNTER — Telehealth: Payer: Self-pay | Admitting: Adult Health

## 2023-01-11 ENCOUNTER — Encounter: Payer: Self-pay | Admitting: Rehabilitation

## 2023-01-11 ENCOUNTER — Ambulatory Visit: Payer: Commercial Managed Care - HMO | Admitting: Rehabilitation

## 2023-01-11 DIAGNOSIS — N644 Mastodynia: Secondary | ICD-10-CM

## 2023-01-11 DIAGNOSIS — Z483 Aftercare following surgery for neoplasm: Secondary | ICD-10-CM

## 2023-01-11 DIAGNOSIS — C50512 Malignant neoplasm of lower-outer quadrant of left female breast: Secondary | ICD-10-CM

## 2023-01-11 DIAGNOSIS — R293 Abnormal posture: Secondary | ICD-10-CM

## 2023-01-11 DIAGNOSIS — M25611 Stiffness of right shoulder, not elsewhere classified: Secondary | ICD-10-CM | POA: Diagnosis not present

## 2023-01-11 DIAGNOSIS — Z171 Estrogen receptor negative status [ER-]: Secondary | ICD-10-CM

## 2023-01-11 NOTE — Therapy (Signed)
OUTPATIENT PHYSICAL THERAPY  UPPER EXTREMITY ONCOLOGY TREATMENT  Patient Name: Julie Townsend MRN: [id number] DOB:1965-04-21, 57 y.o., female Today's Date: 01/11/2023  END OF SESSION:  PT End of Session - 01/11/23 1206     Visit Number 10    Number of Visits 13    Date for PT Re-Evaluation 01/13/23    PT Start Time 1208    PT Stop Time 1256    PT Time Calculation (min) 48 min    Activity Tolerance Patient tolerated treatment well    Behavior During Therapy [facility] for tasks assessed/performed                  Past Medical History:  Diagnosis Date   BRCA2 gene mutation positive 09/26/2021   Breast cancer (HCC) 09/2021   left breast IDC   Family history of breast cancer 09/22/2021   Family history of colon cancer    Family history of ovarian cancer 09/22/2021   Family history of pancreatic cancer 09/22/2021   Family history of prostate cancer    Genetic testing 09/26/2021   Pathogenic variant in BRCA2 at p.V7846* (c.3922G>T).  Report date is September 26, 2021.    The BRCAplus panel offered by [employer] and includes sequencing and deletion/duplication analysis for the following 8 genes: ATM, BRCA1, BRCA2, CDH1, CHEK2, PALB2, PTEN, and TP53.  Results of pan-cancer panel are pending.     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    correct Hands   Port-A-Cath in place 03/10/2022   Past Surgical History:  Procedure Laterality Date   BREAST ENHANCEMENT SURGERY Bilateral 10/28/2022   BREAST IMPLANT REMOVAL Right 12/17/2021   Procedure: Right breast expander removal with washout;  Surgeon: Peggye Form, DO;  Location: [facility];  Service: Plastics;  Laterality: Right;   BREAST RECONSTRUCTION WITH PLACEMENT OF TISSUE EXPANDER AND FLEX HD (ACELLULAR HYDRATED DERMIS) Bilateral 10/29/2021   Procedure: BREAST RECONSTRUCTION WITH PLACEMENT OF TISSUE EXPANDER AND FLEX HD (ACELLULAR HYDRATED DERMIS);  Surgeon: Peggye Form, DO;  Location: [facility];  Service: Plastics;  Laterality: Bilateral;   BREAST RECONSTRUCTION WITH PLACEMENT OF TISSUE EXPANDER AND FLEX HD (ACELLULAR HYDRATED DERMIS) Right 07/26/2022   Procedure: PLACEMENT OF TISSUE EXPANDER;  Surgeon: Peggye Form, DO;  Location: [facility];  Service: Plastics;  Laterality: Right;   CARPAL TUNNEL RELEASE Right 06/20/2020   Procedure: CARPAL TUNNEL RELEASE;  Surgeon: Betha Loa, MD;  Location: [facility];  Service: Orthopedics;  Laterality: Right;   DILATION AND CURETTAGE OF UTERUS     IRRIGATION AND DEBRIDEMENT OF WOUND WITH SPLIT THICKNESS SKIN GRAFT Right 03/15/2022   Procedure: Debridement of right breast;  Surgeon: Peggye Form, DO;  Location: [facility];  Service: Plastics;  Laterality: Right;   LATISSIMUS FLAP TO BREAST Right 07/26/2022   Procedure: Right breast reconstruction and latissimus muscle flap;  Surgeon: Peggye Form, DO;  Location: [facility];  Service: Plastics;  Laterality: Right;   MASTECTOMY     MASTECTOMY W/ SENTINEL NODE BIOPSY Left 10/29/2021   Procedure: LEFT MASTECTOMY WITH LEFT SENTINEL LYMPH NODE BIOPSY;  Surgeon: Abigail Miyamoto, MD;  Location: [facility];  Service: General;  Laterality: Left;   PORT-A-CATH REMOVAL N/A 07/26/2022   Procedure: PORT-A-CATH REMOVAL;  Surgeon: Abigail Miyamoto, MD;  Location: [facility];  Service: General;  Laterality: N/A;   PORTACATH PLACEMENT Right 10/29/2021   Procedure: INSERTION PORT-A-CATH;  Surgeon: Abigail Miyamoto, MD;  Location: [facility];  Service: General;  Laterality: Right;   REMOVAL OF TISSUE EXPANDER AND PLACEMENT OF IMPLANT Bilateral 10/28/2022   Procedure: REMOVAL OF TISSUE EXPANDER AND PLACEMENT OF IMPLANT,  repair of tissue defect to right lateral chest wall;  Surgeon: Peggye Form, DO;  Location: [facility];  Service: Plastics;  Laterality: Bilateral;   ROBOTIC ASSISTED SALPINGO  OOPHERECTOMY Bilateral 09/15/2022   Procedure: XI ROBOTIC ASSISTED SALPINGO OOPHORECTOMY;  Surgeon: Carver Fila, MD;  Location: [facility];  Service: Gynecology;  Laterality: Bilateral;   TOTAL MASTECTOMY Right 10/29/2021   Procedure: RIGHT TOTAL MASTECTOMY;  Surgeon: Abigail Miyamoto, MD;  Location: [facility];  Service: General;  Laterality: Right;   TRIGGER FINGER RELEASE Left 05/08/2020   Procedure: LEFT LONG FINGER TRIGGER RELEASE AND LEFT RING FINGER TRIGGER RELEASE;  Surgeon: Betha Loa, MD;  Location: [facility];  Service: Orthopedics;  Laterality: Left;   trigger finger release rt hand     Patient Active Problem List   Diagnosis Date Noted   Acquired absence of right breast 07/26/2022   Cellulitis of right breast 03/10/2022   Medication management 03/10/2022   Breast cancer (HCC) 10/29/2021   BRCA2 gene mutation positive 09/26/2021   Genetic testing 09/26/2021   Family history of breast cancer 09/22/2021   Family history of pancreatic cancer 09/22/2021   Family history of ovarian cancer 09/22/2021   Malignant neoplasm of lower-outer quadrant of left breast of female, estrogen receptor negative (HCC) 09/11/2021    PCP: None  REFERRING PROVIDER: Caroline More PA-C  REFERRING DIAG:  Diagnosis  Z98.890 (ICD-10-CM) - S/P breast reconstruction    THERAPY DIAG:  Stiffness of right shoulder, not elsewhere classified  Breast pain, right  Aftercare following surgery for neoplasm  Malignant neoplasm of lower-outer quadrant of left breast of female, estrogen receptor negative (HCC)  Abnormal posture  Malignant neoplasm of upper-outer quadrant of left breast in female, estrogen receptor negative (HCC)  ONSET DATE: 10/29/21  Rationale for Evaluation and Treatment: Rehabilitation  SUBJECTIVE:                                                                                                                                                                                            SUBJECTIVE STATEMENT:  Overall doing well.    PERTINENT HISTORY: She most recently underwent bilateral exchange of tissue expanders for implants and bilateral capsulotomies for implant repositioning with Dr. Ulice Bold on 10/28/2022. Lt breast cancer 2023 with bilateral mastectomy 10/29/21 with 5 negative nodes. 12/17/21 - removal of Rt expander with washout. 07/26/22 - Rt lat flap and reconstruction  with another expander. The Right side has a hx of 3 staph infections.   Completed chemo with ACT.  No radiation.    PAIN:  Are you having pain? No pt reports it is only painful to touch  PRECAUTIONS: Lt lymphedema risk    RED FLAGS: None   WEIGHT BEARING RESTRICTIONS: No  FALLS:  Has patient fallen in last 6 months? No  LIVING ENVIRONMENT: Lives with: lives with their family, lives with their spouse, and lives with their daughter  OCCUPATION: work at home.  It will start to become painful at my desk.  I have to learn how to take more breaks.    LEISURE: gardening, crocheting, I am not supposed to lift more than 4# until 12/09/22 for activity restrictions, I normally lift at home - up to 25#.    HAND DOMINANCE: right   PRIOR LEVEL OF FUNCTION: Independent  PATIENT GOALS: get back to normal.     OBJECTIVE: Note: Objective measures were completed at Evaluation unless otherwise noted.  COGNITION: Overall cognitive status: Within functional limits for tasks assessed   PALPATION: Very tender to light palpation Rt breast, trunk, chest wall and around lat incision.  Scar tissue thicker Rt lateral breast   OBSERVATIONS / OTHER ASSESSMENTS: well healed incisions, increased scar tissue Rt breast  POSTURE: WNL  UPPER EXTREMITY AROM/PROM:  A/PROM RIGHT   eval  RIGHT 12/08/22 12/21/22 12/28/22  Shoulder extension 60     Shoulder flexion 140 - pull 149 with pull 150 pull   Shoulder abduction 125 - pect pull 128 with pull 130 pull    Shoulder  internal rotation      Shoulder external rotation 100       (Blank rows = not tested)  A/PROM LEFT   eval  Shoulder extension 60  Shoulder flexion 165  Shoulder abduction 165  Shoulder internal rotation   Shoulder external rotation 105    (Blank rows = not tested)  CERVICAL AROM: All within normal limits:   UPPER EXTREMITY STRENGTH:   L-DEX LYMPHEDEMA SCREENING: The patient was assessed using the L-Dex machine today to produce a lymphedema index baseline score. The patient will be reassessed on a regular basis (typically every 3 months) to obtain new L-Dex scores. If the score is > 6.5 points away from his/her baseline score indicating onset of subclinical lymphedema, it will be recommended to wear a compression garment for 4 weeks, 12 hours per day and then be reassessed. If the score continues to be > 6.5 points from baseline at reassessment, we will initiate lymphedema treatment. Assessing in this manner has a 95% rate of preventing clinically significant lymphedema.  QUICK DASH SURVEY: 22% limited from 18% baseline    TODAY'S TREATMENT:  DATE:  01/11/23 Pulleys x 2 min in direction of flexion and abduction for dynamic warm up  Standing band Yellow scap x 10 each horizontal abduction, ER bil x 10, diagonals x 10bil Standing 2# flexion and abduction In supine:  scar mobilization/STM to Rt breast surrounding tissue, not on implant, to work on desensitization and flexibility Then in sidelying: STM Lt lat and QL region with small clear tall cup used above, below, and across incision without tenderness today or much restriction  01/06/23 Pulleys x 2 min in direction of flexion and abduction for dynamic warm up  Standing band Yellow scap x 10 each horizontal abduction, ER bil x 10, diagonals x 10bil Standing 2# flexion and abduction In supine:   scar mobilization/STM to Rt breast surrounding tissue, not on implant, to work on desensitization and flexibility, scar mob at drain site without jumpiness today. Normal pressure used.  Then in sidelying: STM Lt lat and QL region  12/28/22 Pulleys x 2 min in direction of flexion and abduction for dynamic warm up  Yellow band supine scap x 10 each horizontal abduction, ER bil x 10, diagonals x 10bil Standing 2# flexion and abduction In supine:  scar mobilization/STM to Rt breast surrounding tissue, not on implant, to work on desensitization and flexibility, scar mob at drain site without jumpiness today. Normal pressure used.  Then in sidelying: STM Lt lat and QL region  PATIENT EDUCATION:  Education details: per today's note Person educated: Patient Education method: Explanation, Demonstration, Tactile cues, Verbal cues, and Handouts Education comprehension: verbalized understanding  HOME EXERCISE PROGRAM: Access Code: [email or web address] URL: [email or web address] Date: 12/02/2022 Prepared by: Gwenevere Abbot  Exercises - Supine Lower Trunk Rotation  - 1 x daily - 7 x weekly - 1-3 sets - 10 reps - 20-30 seconds hold - Supine Shoulder Flexion Extension AAROM with Dowel  - 1-2 x daily - 7 x weekly - 10 reps - 5 seconds hold - Standing Shoulder Flexion Wall Walk  - 1 x daily - 7 x weekly - 1-3 sets - 10 reps - 3-4 sec hold  ASSESSMENT:  CLINICAL IMPRESSION: continued POC.  Last visit next time? Only pain was at lat insertion briefly. pt reports no real pain lately.   OBJECTIVE IMPAIRMENTS: decreased activity tolerance, decreased mobility, decreased ROM, decreased strength, and increased fascial restrictions.   ACTIVITY LIMITATIONS: carrying and lifting  PARTICIPATION LIMITATIONS: community activity and yard work  PERSONAL FACTORS: surgical hx are also affecting patient's functional outcome.   REHAB POTENTIAL: Good  CLINICAL DECISION MAKING: Stable/uncomplicated  EVALUATION  COMPLEXITY: Low  GOALS: Goals reviewed with patient? Yes  SHORT TERM GOALS: Target date: 12/17/22  Pt will improve Rt AROM by at least 15 degrees to demonstrate improved mobility. Baseline: Flex; 150, Abd: 125 on eval  Goal status: INITIAL  2.  Pt will report resting breast pain of 6/10 or less Baseline:  Goal status: INITIAL   LONG TERM GOALS: Target date: 01/16/23  Pt will return to gardening and weight lifting activities without limitations  Baseline:  Goal status: INITIAL  2.  Pt will improve AROM to equal to the left side to improve mobility  Baseline:  Goal status: INITIAL  3.  Pt will tolerate normal STM pressure to demonstrate less sensitivity.  Baseline:  Goal status: INITIAL   PLAN:  PT FREQUENCY: 2x/week  PT DURATION: 6 weeks  PLANNED INTERVENTIONS: [id number]- PT Re-evaluation, 97110-Therapeutic exercises, 97530- Therapeutic activity, 97535- Self Care, [id number]- Manual therapy, Patient/Family education, Balance training, Taping, Dry  Needling, Joint mobilization, Therapeutic exercises, Therapeutic activity, and Self Care  PLAN FOR NEXT SESSION: reassess Rt ROM and continue AAROM/PROM, Rt trunk STM, MLD/desensitization, cupping, taping (8 weeks post op chest surgery 12/23/22 - lat flap is much older)  Idamae Lusher, PT 01/11/2023, 2:49 PM

## 2023-01-11 NOTE — Telephone Encounter (Signed)
Patient aware of rescheduled appointment time.

## 2023-01-13 ENCOUNTER — Encounter: Payer: Self-pay | Admitting: Rehabilitation

## 2023-01-13 ENCOUNTER — Other Ambulatory Visit: Payer: Self-pay | Admitting: Hematology and Oncology

## 2023-01-13 ENCOUNTER — Ambulatory Visit: Payer: Commercial Managed Care - HMO | Admitting: Rehabilitation

## 2023-01-13 DIAGNOSIS — Z483 Aftercare following surgery for neoplasm: Secondary | ICD-10-CM

## 2023-01-13 DIAGNOSIS — M25611 Stiffness of right shoulder, not elsewhere classified: Secondary | ICD-10-CM

## 2023-01-13 DIAGNOSIS — C50512 Malignant neoplasm of lower-outer quadrant of left female breast: Secondary | ICD-10-CM

## 2023-01-13 DIAGNOSIS — Z171 Estrogen receptor negative status [ER-]: Secondary | ICD-10-CM

## 2023-01-13 DIAGNOSIS — R293 Abnormal posture: Secondary | ICD-10-CM

## 2023-01-13 DIAGNOSIS — N644 Mastodynia: Secondary | ICD-10-CM

## 2023-01-13 NOTE — Therapy (Addendum)
 OUTPATIENT PHYSICAL THERAPY  UPPER EXTREMITY ONCOLOGY TREATMENT  Patient Name: Julie Townsend MRN: [id number] DOB:1965/04/13, 57 y.o., female Today's Date: 01/13/2023  END OF SESSION:  PT End of Session - 01/13/23 1200     Visit Number 11    Number of Visits 15    Date for PT Re-Evaluation 03/10/23    PT Start Time 1200    PT Stop Time 1258    PT Time Calculation (min) 58 min    Activity Tolerance Patient tolerated treatment well    Behavior During Therapy [facility] for tasks assessed/performed                   Past Medical History:  Diagnosis Date   BRCA2 gene mutation positive 09/26/2021   Breast cancer (HCC) 09/2021   left breast IDC   Family history of breast cancer 09/22/2021   Family history of colon cancer    Family history of ovarian cancer 09/22/2021   Family history of pancreatic cancer 09/22/2021   Family history of prostate cancer    Genetic testing 09/26/2021   Pathogenic variant in BRCA2 at p.[id number]* (c.3922G>T).  Report date is September 26, 2021.    The BRCAplus panel offered by [employer] and includes sequencing and deletion/duplication analysis for the following 8 genes: ATM, BRCA1, BRCA2, CDH1, CHEK2, PALB2, PTEN, and TP53.  Results of pan-cancer panel are pending.     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    correct Hands   Port-A-Cath in place 03/10/2022   Past Surgical History:  Procedure Laterality Date   BREAST ENHANCEMENT SURGERY Bilateral 10/28/2022   BREAST IMPLANT REMOVAL Right 12/17/2021   Procedure: Right breast expander removal with washout;  Surgeon: Thornell Flirt, DO;  Location: [facility];  Service: Plastics;  Laterality: Right;   BREAST RECONSTRUCTION WITH PLACEMENT OF TISSUE EXPANDER AND FLEX HD (ACELLULAR HYDRATED DERMIS) Bilateral 10/29/2021   Procedure: BREAST RECONSTRUCTION WITH PLACEMENT OF TISSUE EXPANDER AND FLEX HD (ACELLULAR HYDRATED DERMIS);  Surgeon: Thornell Flirt, DO;  Location: [facility];  Service: Plastics;  Laterality: Bilateral;   BREAST RECONSTRUCTION WITH PLACEMENT OF TISSUE EXPANDER AND FLEX HD (ACELLULAR HYDRATED DERMIS) Right 07/26/2022   Procedure: PLACEMENT OF TISSUE EXPANDER;  Surgeon: Thornell Flirt, DO;  Location: [facility];  Service: Plastics;  Laterality: Right;   CARPAL TUNNEL RELEASE Right 06/20/2020   Procedure: CARPAL TUNNEL RELEASE;  Surgeon: Brunilda Capra, MD;  Location: [facility];  Service: Orthopedics;  Laterality: Right;   DILATION AND CURETTAGE OF UTERUS     IRRIGATION AND DEBRIDEMENT OF WOUND WITH SPLIT THICKNESS SKIN GRAFT Right 03/15/2022   Procedure: Debridement of right breast;  Surgeon: Thornell Flirt, DO;  Location: [facility];  Service: Plastics;  Laterality: Right;   LATISSIMUS FLAP TO BREAST Right 07/26/2022   Procedure: Right breast reconstruction and latissimus muscle flap;  Surgeon: Thornell Flirt, DO;  Location: [facility];  Service: Plastics;  Laterality: Right;   MASTECTOMY     MASTECTOMY W/ SENTINEL NODE BIOPSY Left 10/29/2021   Procedure: LEFT MASTECTOMY WITH LEFT SENTINEL LYMPH NODE BIOPSY;  Surgeon: Oza Blumenthal, MD;  Location: [facility];  Service: General;  Laterality: Left;   PORT-A-CATH REMOVAL N/A 07/26/2022   Procedure: PORT-A-CATH REMOVAL;  Surgeon: Oza Blumenthal, MD;  Location: [facility];  Service: General;  Laterality: N/A;   PORTACATH PLACEMENT Right 10/29/2021   Procedure: INSERTION PORT-A-CATH;  Surgeon: Oza Blumenthal,  MD;  Location: [facility];  Service: General;  Laterality: Right;   REMOVAL OF TISSUE EXPANDER AND PLACEMENT OF IMPLANT Bilateral 10/28/2022   Procedure: REMOVAL OF TISSUE EXPANDER AND PLACEMENT OF IMPLANT,  repair of tissue defect to right lateral chest wall;  Surgeon: Thornell Flirt, DO;  Location: [facility];  Service: Plastics;  Laterality: Bilateral;   ROBOTIC ASSISTED SALPINGO  OOPHERECTOMY Bilateral 09/15/2022   Procedure: XI ROBOTIC ASSISTED SALPINGO OOPHORECTOMY;  Surgeon: Suzi Essex, MD;  Location: [facility];  Service: Gynecology;  Laterality: Bilateral;   TOTAL MASTECTOMY Right 10/29/2021   Procedure: RIGHT TOTAL MASTECTOMY;  Surgeon: Oza Blumenthal, MD;  Location: [facility];  Service: General;  Laterality: Right;   TRIGGER FINGER RELEASE Left 05/08/2020   Procedure: LEFT LONG FINGER TRIGGER RELEASE AND LEFT RING FINGER TRIGGER RELEASE;  Surgeon: Brunilda Capra, MD;  Location: [facility];  Service: Orthopedics;  Laterality: Left;   trigger finger release rt hand     Patient Active Problem List   Diagnosis Date Noted   Acquired absence of right breast 07/26/2022   Cellulitis of right breast 03/10/2022   Medication management 03/10/2022   Breast cancer (HCC) 10/29/2021   BRCA2 gene mutation positive 09/26/2021   Genetic testing 09/26/2021   Family history of breast cancer 09/22/2021   Family history of pancreatic cancer 09/22/2021   Family history of ovarian cancer 09/22/2021   Malignant neoplasm of lower-outer quadrant of left breast of female, estrogen receptor negative (HCC) 09/11/2021    PCP: None  REFERRING PROVIDER: Lamount Pimple PA-C  REFERRING DIAG:  Diagnosis  Z98.890 (ICD-10-CM) - S/P breast reconstruction    THERAPY DIAG:  Stiffness of right shoulder, not elsewhere classified  Breast pain, right  Aftercare following surgery for neoplasm  Malignant neoplasm of lower-outer quadrant of left breast of female, estrogen receptor negative (HCC)  Malignant neoplasm of upper-outer quadrant of left breast in female, estrogen receptor negative (HCC)  Abnormal posture  ONSET DATE: 10/29/21  Rationale for Evaluation and Treatment: Rehabilitation  SUBJECTIVE:                                                                                                                                                                                            SUBJECTIVE STATEMENT:  Overall doing well.    PERTINENT HISTORY: She most recently underwent bilateral exchange of tissue expanders for implants and bilateral capsulotomies for implant repositioning with Dr. Orin Birk on 10/28/2022. Lt breast cancer 2023 with bilateral mastectomy 10/29/21 with 5 negative nodes. 12/17/21 - removal of Rt expander with washout. 07/26/22 - Rt lat flap  and reconstruction with another expander. The Right side has a hx of 3 staph infections.   Completed chemo with ACT.  No radiation.    PAIN:  Are you having pain? No pt reports it is only painful to touch  PRECAUTIONS: Lt lymphedema risk    RED FLAGS: None   WEIGHT BEARING RESTRICTIONS: No  FALLS:  Has patient fallen in last 6 months? No  LIVING ENVIRONMENT: Lives with: lives with their family, lives with their spouse, and lives with their daughter  OCCUPATION: work at home.  It will start to become painful at my desk.  I have to learn how to take more breaks.    LEISURE: gardening, crocheting, I am not supposed to lift more than 4# until 12/09/22 for activity restrictions, I normally lift at home - up to 25#.    HAND DOMINANCE: right   PRIOR LEVEL OF FUNCTION: Independent  PATIENT GOALS: get back to normal.     OBJECTIVE: Note: Objective measures were completed at Evaluation unless otherwise noted.  COGNITION: Overall cognitive status: Within functional limits for tasks assessed   PALPATION: Very tender to light palpation Rt breast, trunk, chest wall and around lat incision.  Scar tissue thicker Rt lateral breast   OBSERVATIONS / OTHER ASSESSMENTS: well healed incisions, increased scar tissue Rt breast  POSTURE: WNL  UPPER EXTREMITY AROM/PROM:  A/PROM RIGHT   eval  RIGHT 12/08/22 12/21/22 01/13/23  Shoulder extension 60     Shoulder flexion 140 - pull 149 with pull 150 pull 150 - pull in breast  Shoulder abduction 125 - pect pull 128 with pull 130 pull   160 - pull  Shoulder internal rotation      Shoulder external rotation 100       (Blank rows = not tested)  A/PROM LEFT   eval  Shoulder extension 60  Shoulder flexion 165  Shoulder abduction 165  Shoulder internal rotation   Shoulder external rotation 105    (Blank rows = not tested)  CERVICAL AROM: All within normal limits:   UPPER EXTREMITY STRENGTH:   L-DEX LYMPHEDEMA SCREENING: The patient was assessed using the L-Dex machine today to produce a lymphedema index baseline score. The patient will be reassessed on a regular basis (typically every 3 months) to obtain new L-Dex scores. If the score is > 6.5 points away from his/her baseline score indicating onset of subclinical lymphedema, it will be recommended to wear a compression garment for 4 weeks, 12 hours per day and then be reassessed. If the score continues to be > 6.5 points from baseline at reassessment, we will initiate lymphedema treatment. Assessing in this manner has a 95% rate of preventing clinically significant lymphedema.  QUICK DASH SURVEY: 22% limited from 18% baseline ,  27% on 01/13/23   TODAY'S TREATMENT:  DATE:    01/13/23 Pulleys x 2 min in direction of flexion and abduction for dynamic warm up  Standing band Yellow scap x 10 each horizontal abduction, ER bil x 10, diagonals x 10bil Standing 2# flexion and abduction x 10 each  Cable machine row 3# x 5 and 7# x 5 Single arm lat row 7# x 10 Lt, 3# x 10 Lt In supine:  scar mobilization/STM to Rt breast surrounding tissue, not on implant, to work on desensitization and flexibility Then in sidelying: STM Lt lat and QL below, and across incision with more tenderness today  01/11/23 Pulleys x 2 min in direction of flexion and abduction for dynamic warm up  Standing band Yellow scap x 10 each horizontal abduction, ER bil x  10, diagonals x 10bil Standing 2# flexion and abduction In supine:  scar mobilization/STM to Rt breast surrounding tissue, not on implant, to work on desensitization and flexibility Then in sidelying: STM Lt lat and QL region with small clear tall cup used above, below, and across incision without tenderness today or much restriction  01/06/23 Pulleys x 2 min in direction of flexion and abduction for dynamic warm up  Standing band Yellow scap x 10 each horizontal abduction, ER bil x 10, diagonals x 10bil Standing 2# flexion and abduction In supine:  scar mobilization/STM to Rt breast surrounding tissue, not on implant, to work on desensitization and flexibility, scar mob at drain site without jumpiness today. Normal pressure used.  Then in sidelying: STM Lt lat and QL region  12/28/22 Pulleys x 2 min in direction of flexion and abduction for dynamic warm up  Yellow band supine scap x 10 each horizontal abduction, ER bil x 10, diagonals x 10bil Standing 2# flexion and abduction In supine:  scar mobilization/STM to Rt breast surrounding tissue, not on implant, to work on desensitization and flexibility, scar mob at drain site without jumpiness today. Normal pressure used.  Then in sidelying: STM Lt lat and QL region  PATIENT EDUCATION:  Education details: per today's note Person educated: Patient Education method: Explanation, Demonstration, Tactile cues, Verbal cues, and Handouts Education comprehension: verbalized understanding  HOME EXERCISE PROGRAM: Access Code: [email or web address] URL: [email or web address] Date: 01/13/2023 Prepared by: Judy Null  Exercises - Supine Lower Trunk Rotation  - 1 x daily - 7 x weekly - 1-3 sets - 10 reps - 20-30 seconds hold - Supine Shoulder Flexion Extension AAROM with Dowel  - 1-2 x daily - 7 x weekly - 10 reps - 5 seconds hold - Standing Shoulder Flexion Wall Walk  - 1 x daily - 7 x weekly - 1-3 sets - 10 reps - 3-4 sec hold - Seated  Shoulder Horizontal Abduction with Resistance  - 1 x daily - 3 x weekly - 1-3 sets - 10 reps - 2-3 secon hold - Shoulder External Rotation and Scapular Retraction with Resistance  - 1 x daily - 3 x weekly - 1-3 sets - 10 reps - 2-3 sec hold - Seated Diagonal Reach with Resistance  - 1 x daily - 3 x weekly - 1-3 sets - 10 reps - 20-30 seconds hold - Standing Shoulder Flexion with Dumbbells  - 1 x daily - 3 x weekly - 1-3 sets - 10 reps - 2-3 seconds hold - Shoulder Abduction with Dumbbells - Thumbs Up  - 1 x daily - 3 x weekly - 1-3 sets - 10 reps - 2-3 hold  ASSESSMENT:  CLINICAL IMPRESSION: continued POC.  Will extend x  8 more weeks for visits every other week to test for DC. Pt was more sensitive today in the lat in sidelying and this may be from having to run to her car the other day and reporting this feel weird or a busy day yesterday.  Unmet goals will continue. Added cable machine today which was tolerated well to increase ability to push and pull .   OBJECTIVE IMPAIRMENTS: decreased activity tolerance, decreased mobility, decreased ROM, decreased strength, and increased fascial restrictions.   ACTIVITY LIMITATIONS: carrying and lifting  PARTICIPATION LIMITATIONS: community activity and yard work  PERSONAL FACTORS: surgical hx are also affecting patient's functional outcome.   REHAB POTENTIAL: Good  CLINICAL DECISION MAKING: Stable/uncomplicated  EVALUATION COMPLEXITY: Low  GOALS: Goals reviewed with patient? Yes  SHORT TERM GOALS: Target date: 12/17/22  Pt will improve Rt AROM by at least 15 degrees to demonstrate improved mobility. Baseline: Flex; 150, Abd: 125 on eval  Goal status: MET  2.  Pt will report resting breast pain of 6/10 or less Baseline:  Goal status: MET - reports no pain unless thinking about it    LONG TERM GOALS: Target date: 03/13/23  Pt will return to gardening and weight lifting activities without limitations  Baseline:  Goal status: PARTIALLY  MET:  still trouble with a digging motion due to lat flap  2.  Pt will improve AROM to equal to the left side to improve mobility  Baseline:  Goal status: PARTIALLY MET - missing by 5deg   3.  Pt will tolerate normal STM pressure to demonstrate less sensitivity.  Baseline:  Goal status: MET   PLAN:  PT FREQUENCY: 2x/week  PT DURATION: 6 weeks  PLANNED INTERVENTIONS: 97164- PT Re-evaluation, 97110-Therapeutic exercises, 97530- Therapeutic activity, 97535- Self Care, 16109- Manual therapy, Patient/Family education, Balance training, Taping, Dry Needling, Joint mobilization, Therapeutic exercises, Therapeutic activity, and Self Care  PLAN FOR NEXT SESSION: Rt trunk STM, MLD/desensitization, cupping, taping , strengthening, (8 weeks post op chest surgery 12/23/22 - lat flap is much older)  Encarnacion Harris, PT 01/13/2023, 1:00 PM  PHYSICAL THERAPY DISCHARGE SUMMARY  Visits from Start of Care: 11  Current functional level related to goals / functional outcomes: No return after last visit

## 2023-01-28 ENCOUNTER — Ambulatory Visit: Payer: Commercial Managed Care - HMO | Admitting: Rehabilitation

## 2023-02-09 ENCOUNTER — Encounter: Payer: Commercial Managed Care - HMO | Admitting: Rehabilitation

## 2023-02-11 ENCOUNTER — Ambulatory Visit: Payer: Commercial Managed Care - HMO | Admitting: Plastic Surgery

## 2023-02-11 ENCOUNTER — Encounter: Payer: Self-pay | Admitting: Plastic Surgery

## 2023-02-11 DIAGNOSIS — Z171 Estrogen receptor negative status [ER-]: Secondary | ICD-10-CM

## 2023-02-11 DIAGNOSIS — C50512 Malignant neoplasm of lower-outer quadrant of left female breast: Secondary | ICD-10-CM

## 2023-02-11 DIAGNOSIS — Z9011 Acquired absence of right breast and nipple: Secondary | ICD-10-CM

## 2023-02-11 NOTE — Progress Notes (Signed)
   Subjective:    Patient ID: Julie Townsend, female    DOB: May 08, 1965, 58 y.o.   MRN: [id number]  The patient is a 58 year old female joining me by phone for further discussion about her breast reconstruction.  She underwent bilateral mastectomies with expander placement.  She had some issues on the right side and underwent a latissimus muscle flap.  She now has bilateral silicone breast implants in place.  She has Mentor smooth round high-profile gel 250 cc implant on the left and a Mentor smooth round high-profile gel 125 cc implant in on the right.  She would like better symmetry with less excess skin that seems to be noticeable even in clothing.      Review of Systems  Constitutional: Negative.   Eyes: Negative.   Respiratory: Negative.    Cardiovascular: Negative.   Gastrointestinal: Negative.   Endocrine: Negative.   Genitourinary: Negative.   Musculoskeletal: Negative.        Objective:   Physical Exam     Assessment & Plan:  Acquired absence of right breast  Malignant neoplasm of lower-outer quadrant of left breast of female, estrogen receptor negative (HCC)  I connected with  Julie Townsend on 02/11/23 by phone and verified that I am speaking with the correct person 30 minutes in discussion.  The patient was at home and I was at the office.  The patient would like to move forward with fat grafting to bilateral breast with excision of excess right breast tissue.   I discussed the limitations of evaluation and management by telemedicine. The patient expressed understanding and agreed to proceed.

## 2023-02-13 ENCOUNTER — Other Ambulatory Visit: Payer: Self-pay | Admitting: Neurology

## 2023-02-16 ENCOUNTER — Inpatient Hospital Stay: Payer: Commercial Managed Care - HMO

## 2023-02-16 ENCOUNTER — Inpatient Hospital Stay: Payer: Commercial Managed Care - HMO | Attending: Hematology and Oncology | Admitting: Adult Health

## 2023-02-16 VITALS — BP 116/83 | HR 77 | Temp 97.7°F | Resp 16 | Wt 117.9 lb

## 2023-02-16 DIAGNOSIS — Z13220 Encounter for screening for lipoid disorders: Secondary | ICD-10-CM | POA: Diagnosis not present

## 2023-02-16 DIAGNOSIS — C50512 Malignant neoplasm of lower-outer quadrant of left female breast: Secondary | ICD-10-CM | POA: Diagnosis not present

## 2023-02-16 DIAGNOSIS — Z9013 Acquired absence of bilateral breasts and nipples: Secondary | ICD-10-CM | POA: Insufficient documentation

## 2023-02-16 DIAGNOSIS — R4189 Other symptoms and signs involving cognitive functions and awareness: Secondary | ICD-10-CM

## 2023-02-16 DIAGNOSIS — E559 Vitamin D deficiency, unspecified: Secondary | ICD-10-CM

## 2023-02-16 DIAGNOSIS — Z853 Personal history of malignant neoplasm of breast: Secondary | ICD-10-CM | POA: Diagnosis present

## 2023-02-16 DIAGNOSIS — R5383 Other fatigue: Secondary | ICD-10-CM

## 2023-02-16 DIAGNOSIS — Z9221 Personal history of antineoplastic chemotherapy: Secondary | ICD-10-CM | POA: Insufficient documentation

## 2023-02-16 DIAGNOSIS — Z8041 Family history of malignant neoplasm of ovary: Secondary | ICD-10-CM | POA: Insufficient documentation

## 2023-02-16 DIAGNOSIS — Z803 Family history of malignant neoplasm of breast: Secondary | ICD-10-CM | POA: Insufficient documentation

## 2023-02-16 DIAGNOSIS — Z800 Family history of malignant neoplasm of digestive organs: Secondary | ICD-10-CM | POA: Insufficient documentation

## 2023-02-16 DIAGNOSIS — E538 Deficiency of other specified B group vitamins: Secondary | ICD-10-CM | POA: Diagnosis not present

## 2023-02-16 DIAGNOSIS — Z171 Estrogen receptor negative status [ER-]: Secondary | ICD-10-CM

## 2023-02-16 LAB — CBC WITH DIFFERENTIAL (CANCER CENTER ONLY)
Abs Immature Granulocytes: 0.01 10*3/uL (ref 0.00–0.07)
Basophils Absolute: 0 10*3/uL (ref 0.0–0.1)
Basophils Relative: 1 %
Eosinophils Absolute: 0.1 10*3/uL (ref 0.0–0.5)
Eosinophils Relative: 3 %
HCT: 42.9 % (ref 36.0–46.0)
Hemoglobin: 14.7 g/dL (ref 12.0–15.0)
Immature Granulocytes: 0 %
Lymphocytes Relative: 37 %
Lymphs Abs: 1.3 10*3/uL (ref 0.7–4.0)
MCH: 31.6 pg (ref 26.0–34.0)
MCHC: 34.3 g/dL (ref 30.0–36.0)
MCV: 92.3 fL (ref 80.0–100.0)
Monocytes Absolute: 0.3 10*3/uL (ref 0.1–1.0)
Monocytes Relative: 8 %
Neutro Abs: 1.7 10*3/uL (ref 1.7–7.7)
Neutrophils Relative %: 51 %
Platelet Count: 264 10*3/uL (ref 150–400)
RBC: 4.65 MIL/uL (ref 3.87–5.11)
RDW: 14 % (ref 11.5–15.5)
WBC Count: 3.5 10*3/uL — ABNORMAL LOW (ref 4.0–10.5)
nRBC: 0 % (ref 0.0–0.2)

## 2023-02-16 LAB — CMP (CANCER CENTER ONLY)
ALT: 9 U/L (ref 0–44)
AST: 20 U/L (ref 15–41)
Albumin: 4.9 g/dL (ref 3.5–5.0)
Alkaline Phosphatase: 67 U/L (ref 38–126)
Anion gap: 7 (ref 5–15)
BUN: 15 mg/dL (ref 6–20)
CO2: 29 mmol/L (ref 22–32)
Calcium: 10.1 mg/dL (ref 8.9–10.3)
Chloride: 103 mmol/L (ref 98–111)
Creatinine: 0.65 mg/dL (ref 0.44–1.00)
GFR, Estimated: >60 mL/min (ref >60)
Glucose, Bld: 90 mg/dL (ref 70–99)
Potassium: 3.9 mmol/L (ref 3.5–5.1)
Sodium: 139 mmol/L (ref 135–145)
Total Bilirubin: 0.6 mg/dL (ref 0.0–1.2)
Total Protein: 8.2 g/dL — ABNORMAL HIGH (ref 6.5–8.1)

## 2023-02-16 LAB — LIPID PANEL
Cholesterol: 328 mg/dL — ABNORMAL HIGH (ref 0–200)
HDL: 94 mg/dL (ref >40)
LDL Cholesterol: 218 mg/dL — ABNORMAL HIGH (ref 0–99)
Total CHOL/HDL Ratio: 3.5 {ratio}
Triglycerides: 81 mg/dL (ref <150)
VLDL: 16 mg/dL (ref 0–40)

## 2023-02-16 LAB — FOLATE: Folate: 10.9 ng/mL (ref >5.9)

## 2023-02-16 LAB — VITAMIN D 25 HYDROXY (VIT D DEFICIENCY, FRACTURES): Vit D, 25-Hydroxy: 21.96 ng/mL — ABNORMAL LOW (ref 30–100)

## 2023-02-16 LAB — VITAMIN B12: Vitamin B-12: 136 pg/mL — ABNORMAL LOW (ref 180–914)

## 2023-02-16 LAB — TSH: TSH: 1.012 u[IU]/mL (ref 0.350–4.500)

## 2023-02-16 NOTE — Progress Notes (Signed)
[facility] Cancer Follow up:    Pcp, No No address on file   DIAGNOSIS:  Cancer Staging  Malignant neoplasm of lower-outer quadrant of left breast of female, estrogen receptor negative (HCC) Staging form: Breast, AJCC 8th Edition - Clinical stage from 09/14/2021: Stage IB (cT1c, cN0, cM0, G3, ER-, PR-, HER2-) - Signed by Ronny Bacon, PA-C on 09/14/2021 Stage prefix: Initial diagnosis Method of lymph node assessment: Clinical Histologic grading system: 3 grade system   SUMMARY OF ONCOLOGIC HISTORY: Oncology History  Malignant neoplasm of lower-outer quadrant of left breast of female, estrogen receptor negative (HCC)  09/07/2021 Initial Diagnosis   Screening mammogram detected left breast asymmetry posteriorly.  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, Ki-67 70%   09/14/2021 Cancer Staging   Staging form: Breast, AJCC 8th Edition - Clinical stage from 09/14/2021: Stage IB (cT1c, cN0, cM0, G3, ER-, PR-, HER2-) - Signed by Ronny Bacon, PA-C on 09/14/2021 Stage prefix: Initial diagnosis Method of lymph node assessment: Clinical Histologic grading system: 3 grade system   09/26/2021 Genetic Testing   Pathogenic variant in BRCA2 at p.L2440* (c.3922G>T).  Report date is September 26, 2021 (BRCAPlus) and September 29, 2021 (expanded).    The CancerNext-Expanded gene panel offered by [facility] and includes sequencing, rearrangement, and RNA analysis for the following 77 genes: AIP, ALK, APC, ATM, AXIN2, BAP1, BARD1, BLM, BMPR1A, BRCA1, BRCA2, BRIP1, CDC73, CDH1, CDK4, CDKN1B, CDKN2A, CHEK2, CTNNA1, DICER1, FANCC, FH, FLCN, GALNT12, KIF1B, LZTR1, MAX, MEN1, MET, MLH1, MSH2, MSH3, MSH6, MUTYH, NBN, NF1, NF2, NTHL1, PALB2, PHOX2B, PMS2, POT1, PRKAR1A, PTCH1, PTEN, RAD51C, RAD51D, RB1, RECQL, RET, SDHA, SDHAF2, SDHB, SDHC, SDHD, SMAD4, SMARCA4, SMARCB1, SMARCE1, STK11, SUFU, TMEM127,  TP53, TSC1, TSC2, VHL and XRCC2 (sequencing and deletion/duplication); EGFR, EGLN1, HOXB13, KIT, MITF, PDGFRA, POLD1, and POLE (sequencing only); EPCAM and GREM1 (deletion/duplication only).    10/29/2021 Surgery   Bilateral mastectomies Right mastectomy: Benign, PASH Left mastectomy: Grade 3 IDC with DCIS 1.2 cm, margins negative, 0/5 sentinel lymph nodes negative, ER 0%, PR 0%, HER2 negative, Ki-67 70%   12/03/2021 - 06/03/2022 Chemotherapy   Patient is on Treatment Plan : BREAST ADJUVANT DOSE DENSE AC q14d / PACLitaxel q7d       CURRENT THERAPY: observation  INTERVAL HISTORY:  Discussed the use of AI scribe software for clinical note transcription with the patient, who gave verbal consent to proceed.  Julie Townsend 58 y.o. female a survivor of triple negative breast cancer, presents with ongoing side effects from chemotherapy and surgical treatment. She reports experiencing memory fog, often forgetting things unless she is written down or consciously focused on. This has been a significant change from her pre-treatment cognitive function and is causing her frustration.  In addition to cognitive changes, the patient also reports physical discomfort in the areas where she had surgery, including the port area and the site of her latissimus dorsi flap reconstruction. She also mentions persistent pain in her back, which she attributes to the surgical procedures.  The patient also describes emotional instability, with a short temper and frequent crying spells. She compares her emotional state to being pregnant or watching a sentimental movie, with emotions easily triggered by minor incidents. She expresses frustration with this emotional instability and hopes for improvement over time.  The patient has not been engaging in regular exercise due to cold weather and lack of motivation. She reports a stable  diet and weight, with a particular craving for vinegar. She has been proactive in her  health maintenance, with regular check-ups and tests, all of which have returned negative results so far.   Patient Active Problem List   Diagnosis Date Noted   Acquired absence of right breast 07/26/2022   Cellulitis of right breast 03/10/2022   Medication management 03/10/2022   Breast cancer (HCC) 10/29/2021   BRCA2 gene mutation positive 09/26/2021   Genetic testing 09/26/2021   Family history of breast cancer 09/22/2021   Family history of pancreatic cancer 09/22/2021   Family history of ovarian cancer 09/22/2021   Malignant neoplasm of lower-outer quadrant of left breast of female, estrogen receptor negative (HCC) 09/11/2021    is allergic to bee venom and gadavist [gadobutrol].  MEDICAL HISTORY: Past Medical History:  Diagnosis Date   BRCA2 gene mutation positive 09/26/2021   Breast cancer (HCC) 09/2021   left breast IDC   Family history of breast cancer 09/22/2021   Family history of colon cancer    Family history of ovarian cancer 09/22/2021   Family history of pancreatic cancer 09/22/2021   Family history of prostate cancer    Genetic testing 09/26/2021   Pathogenic variant in BRCA2 at p.Z6109* (c.3922G>T).  Report date is September 26, 2021.    The BRCAplus panel offered by [facility] and includes sequencing and deletion/duplication analysis for the following 8 genes: ATM, BRCA1, BRCA2, CDH1, CHEK2, PALB2, PTEN, and TP53.  Results of pan-cancer panel are pending.     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    correct Hands   Port-A-Cath in place 03/10/2022    SURGICAL HISTORY: Past Surgical History:  Procedure Laterality Date   BREAST ENHANCEMENT SURGERY Bilateral 10/28/2022   BREAST IMPLANT REMOVAL Right 12/17/2021   Procedure: Right breast expander removal with washout;  Surgeon: Peggye Form, DO;  Location: [facility];  Service: Plastics;  Laterality: Right;   BREAST RECONSTRUCTION WITH PLACEMENT OF TISSUE  EXPANDER AND FLEX HD (ACELLULAR HYDRATED DERMIS) Bilateral 10/29/2021   Procedure: BREAST RECONSTRUCTION WITH PLACEMENT OF TISSUE EXPANDER AND FLEX HD (ACELLULAR HYDRATED DERMIS);  Surgeon: Peggye Form, DO;  Location: [facility];  Service: Plastics;  Laterality: Bilateral;   BREAST RECONSTRUCTION WITH PLACEMENT OF TISSUE EXPANDER AND FLEX HD (ACELLULAR HYDRATED DERMIS) Right 07/26/2022   Procedure: PLACEMENT OF TISSUE EXPANDER;  Surgeon: Peggye Form, DO;  Location: [facility];  Service: Plastics;  Laterality: Right;   CARPAL TUNNEL RELEASE Right 06/20/2020   Procedure: CARPAL TUNNEL RELEASE;  Surgeon: Betha Loa, MD;  Location: [facility];  Service: Orthopedics;  Laterality: Right;   DILATION AND CURETTAGE OF UTERUS     IRRIGATION AND DEBRIDEMENT OF WOUND WITH SPLIT THICKNESS SKIN GRAFT Right 03/15/2022   Procedure: Debridement of right breast;  Surgeon: Peggye Form, DO;  Location: [facility];  Service: Plastics;  Laterality: Right;   LATISSIMUS FLAP TO BREAST Right 07/26/2022   Procedure: Right breast reconstruction and latissimus muscle flap;  Surgeon: Peggye Form, DO;  Location: [facility];  Service: Plastics;  Laterality: Right;   MASTECTOMY     MASTECTOMY W/ SENTINEL NODE BIOPSY Left 10/29/2021   Procedure: LEFT MASTECTOMY WITH LEFT SENTINEL LYMPH NODE BIOPSY;  Surgeon: Abigail Miyamoto, MD;  Location: [facility];  Service: General;  Laterality: Left;   PORT-A-CATH REMOVAL N/A 07/26/2022   Procedure: PORT-A-CATH REMOVAL;  Surgeon: Abigail Miyamoto, MD;  Location: [facility];  Service: General;  Laterality: N/A;   PORTACATH PLACEMENT Right 10/29/2021   Procedure: INSERTION PORT-A-CATH;  Surgeon: Abigail Miyamoto, MD;  Location: [facility];  Service: General;  Laterality: Right;   REMOVAL OF TISSUE EXPANDER AND PLACEMENT OF IMPLANT Bilateral 10/28/2022   Procedure: REMOVAL OF TISSUE EXPANDER AND PLACEMENT OF  IMPLANT,  repair of tissue defect to right lateral chest wall;  Surgeon: Peggye Form, DO;  Location: [facility];  Service: Plastics;  Laterality: Bilateral;   ROBOTIC ASSISTED SALPINGO OOPHERECTOMY Bilateral 09/15/2022   Procedure: XI ROBOTIC ASSISTED SALPINGO OOPHORECTOMY;  Surgeon: Carver Fila, MD;  Location: [facility];  Service: Gynecology;  Laterality: Bilateral;   TOTAL MASTECTOMY Right 10/29/2021   Procedure: RIGHT TOTAL MASTECTOMY;  Surgeon: Abigail Miyamoto, MD;  Location: [facility];  Service: General;  Laterality: Right;   TRIGGER FINGER RELEASE Left 05/08/2020   Procedure: LEFT LONG FINGER TRIGGER RELEASE AND LEFT RING FINGER TRIGGER RELEASE;  Surgeon: Betha Loa, MD;  Location: [facility];  Service: Orthopedics;  Laterality: Left;   trigger finger release rt hand      SOCIAL HISTORY: Social History   Socioeconomic History   Marital status: Married    Spouse name: Not on file   Number of children: 2   Years of education: Not on file   Highest education level: Not on file  Occupational History   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   Right handed   Social Drivers of Corporate investment banker Strain: Not on file  Food Insecurity: Not on file  Transportation Needs: Not on file  Physical Activity: Not on file  Stress: Not on file  Social Connections: Not on file  Intimate Partner Violence: Not on file    FAMILY HISTORY: Family History  Problem Relation Age of Onset   Migraines Mother    Breast cancer Mother 62        recurr at  43   Migraines Maternal Aunt    Colon cancer Maternal Uncle        dx late 86s   Pancreatic cancer Maternal Uncle 28   Migraines Maternal Grandmother    Ovarian cancer Maternal Grandmother        dx 79s    Review of Systems  Constitutional:  Positive for fatigue. Negative for appetite change, chills, fever and unexpected weight change.  HENT:   Negative for hearing loss, lump/mass and trouble swallowing.   Eyes:  Negative for eye problems and icterus.  Respiratory:  Negative for chest tightness, cough and shortness of breath.   Cardiovascular:  Negative for chest pain, leg swelling and palpitations.  Gastrointestinal:  Negative for abdominal distention, abdominal pain, constipation, diarrhea, nausea and vomiting.  Endocrine: Negative for hot flashes.  Genitourinary:  Negative for difficulty urinating.   Musculoskeletal:  Negative for arthralgias.  Skin:  Negative for itching and rash.  Neurological:  Negative for dizziness, extremity weakness, headaches and numbness.  Hematological:  Negative for adenopathy. Does not bruise/bleed easily.  Psychiatric/Behavioral:  Negative for depression. The patient is not nervous/anxious.       PHYSICAL EXAMINATION    Vitals:   02/16/23 1055  BP: 116/83  Pulse: 77  Resp: 16  Temp: 97.7 F (36.5 C)  SpO2: 100%    Physical Exam Constitutional:      General: She is not in acute distress.    Appearance: Normal appearance. She is not toxic-appearing.  HENT:     Head: Normocephalic and atraumatic.     Mouth/Throat:     Mouth: Mucous membranes are moist.     Pharynx: Oropharynx is clear. No oropharyngeal exudate or posterior oropharyngeal erythema.  Eyes:     General: No scleral icterus. Cardiovascular:     Rate and Rhythm: Normal rate and regular rhythm.     Pulses: Normal pulses.     Heart sounds: Normal heart sounds.  Pulmonary:     Effort: Pulmonary effort is normal.     Breath sounds: Normal breath sounds.  Chest:      Comments: S/p bilateral mastectomies and reconstruction, no sign of local recurrence Abdominal:     General: Abdomen is flat. Bowel sounds are normal. There is no distension.     Palpations: Abdomen is soft.     Tenderness: There is no abdominal tenderness.  Musculoskeletal:        General: No swelling.     Cervical back: Neck supple.  Lymphadenopathy:     Cervical: No cervical adenopathy.     Upper Body:     Right upper body: No axillary adenopathy.     Left upper body: No axillary adenopathy.  Skin:    General: Skin is warm and dry.     Findings: No rash.  Neurological:     General: No focal deficit present.     Mental Status: She is alert.  Psychiatric:        Mood and Affect: Mood normal.        Behavior: Behavior normal.     LABORATORY DATA:  CBC    Component Value Date/Time   WBC 3.5 (L) 02/16/2023 1157   WBC 4.6 09/07/2022 1133   RBC 4.65 02/16/2023 1157   HGB 14.7 02/16/2023 1157   HCT 42.9 02/16/2023 1157   PLT 264 02/16/2023 1157   MCV 92.3 02/16/2023 1157   MCH 31.6 02/16/2023 1157   MCHC 34.3 02/16/2023 1157   RDW 14.0 02/16/2023 1157   LYMPHSABS 1.3 02/16/2023 1157   MONOABS 0.3 02/16/2023 1157   EOSABS 0.1 02/16/2023 1157   BASOSABS 0.0 02/16/2023 1157    CMP     Component Value Date/Time   NA 139 02/16/2023 1157   K 3.9 02/16/2023 1157   CL 103 02/16/2023 1157   CO2 29 02/16/2023 1157   GLUCOSE 90 02/16/2023 1157   BUN 15 02/16/2023 1157   CREATININE 0.65 02/16/2023 1157   CALCIUM 10.1 02/16/2023 1157   PROT 8.2 (H) 02/16/2023 1157   ALBUMIN 4.9 02/16/2023 1157   AST 20 02/16/2023 1157   ALT 9 02/16/2023 1157   ALKPHOS 67 02/16/2023 1157   BILITOT 0.6 02/16/2023 1157   GFRNONAA >60 02/16/2023 1157       ASSESSMENT and THERAPY PLAN:   Malignant neoplasm of lower-outer quadrant of left breast of female, estrogen receptor negative (HCC) 09/07/2021:Screening mammogram detected left breast asymmetry posteriorly.  Measured 1.3 cm by  ultrasound at 5 o'clock position, axilla negative: Biopsy revealed grade 3 IDC ER 0% PR 0% HER2 negative, Ki-67 70% 10/29/2021:  Bilateral mastectomies (BRCA2 gene mutation positive) Right mastectomy: Benign, PASH Left mastectomy: Grade 3 IDC with DCIS 1.2 cm, margins negative, 0/5 sentinel lymph nodes negative, ER 0%, PR 0%, HER2 negative, Ki-67 70%   Treatment plan: Adjuvant chemotherapy with Adriamycin and Cytoxan followed by Taxol completed No role of adjuvant radiation or antiestrogen therapy ---------------------------------------------------------------------------------------------------------------------------------------------------------------------  Triple Negative Breast Cancer Completed chemotherapy, negative Nutera testing, and no signs of recurrence on physical exam. Discussed risk of recurrence within first five years post-treatment. -Continue regular Natera testing every 3-6 months. -Return for follow-up in 6 months.  Post-Chemotherapy Cognitive Impairment (Chemobrain) Reports memory fog and forgetfulness. Discussed potential role of vitamin deficiencies. -Order labs including Vitamin D and B12 levels.  Post-Chemotherapy Physical Changes Reports hair loss, nail changes, and absent eyelashes. No specific intervention discussed.  Post-Oophorectomy Hormonal Changes Reports emotional lability, likened to pregnancy. Discussed potential role of vitamin deficiencies.  -Order labs including Vitamin D and B12 levels.  General Health Maintenance -Order lipid panel. -Recommended healthy diet and exercise.    All questions were answered. The patient knows to call the clinic with any problems, questions or concerns. We can certainly see the patient much sooner if necessary.  Total encounter time:30 minutes*in face-to-face visit time, chart review, lab review, care coordination, order entry, and documentation of the encounter time.    Lillard Anes, NP 02/18/23 11:13  AM Medical Oncology and Hematology [facility] [address] [place], [place] [address] Tel. [phone]    Fax. [phone]  *Total Encounter Time as defined by the Centers for Medicare and Medicaid Services includes, in addition to the face-to-face time of a patient visit (documented in the note above) non-face-to-face time: obtaining and reviewing outside history, ordering and reviewing medications, tests or procedures, care coordination (communications with other health care professionals or caregivers) and documentation in the medical record.

## 2023-02-18 ENCOUNTER — Other Ambulatory Visit: Payer: Self-pay | Admitting: *Deleted

## 2023-02-18 ENCOUNTER — Encounter: Payer: Self-pay | Admitting: Adult Health

## 2023-02-18 ENCOUNTER — Telehealth: Payer: Self-pay

## 2023-02-18 ENCOUNTER — Other Ambulatory Visit: Payer: Self-pay | Admitting: Adult Health

## 2023-02-18 DIAGNOSIS — E559 Vitamin D deficiency, unspecified: Secondary | ICD-10-CM

## 2023-02-18 DIAGNOSIS — E7800 Pure hypercholesterolemia, unspecified: Secondary | ICD-10-CM

## 2023-02-18 MED ORDER — ERGOCALCIFEROL 1.25 MG (50000 UT) PO CAPS: 50000.0000 [IU] | ORAL_CAPSULE | ORAL | 1 refills | Status: DC

## 2023-02-18 NOTE — Telephone Encounter (Addendum)
Attempted to call pt. With message below. Lvm for patient to follow the instructions giving. Advise pt to call back with any questions or concerns. ----- Message from Noreene Filbert sent at 02/17/2023  4:19 PM EST ----- Please call patient and let her know that her vitamin D and b12 are low.    Please call in Ergocalciferol 50,000 units weekly disp 12, 1 refill Please recommend Vitamin B12 SUBLINGUAL, over the counter daily  Please order vitamin b12, and vitamin d level to be repeated in 12 weeks.    Her cholesterol is very very high and concerning.  I spoke with a friend and colleague in cardiology who recommends she see the lipid clinic--Dr. Hilty to evaluate for familial hyperlipidemia. If agreeable, I will refer.  Thanks!  Mardella Layman ----- Message ----- From: Leory Plowman, Lab In [place] Sent: 02/16/2023  12:12 PM EST To: Loa Socks, NP

## 2023-02-18 NOTE — Progress Notes (Signed)
Received call from pt to review missed call regarding lab work.  Per NP pt needing to be prescribed ergocalciferol 50,000 unit weekly as well as take OTC sublingual Vitamin b12 1,000 mcg daily.  Pt also states she is interested in seeing Dr. Rennis Golden at the hyperlipidemia clinic, NP will place referral.  Prescription sent to pharmacy on file.  Message also sent to scheduling team to schedule repeat lab appt in 12 weeks.  Pt educated and verbalized understanding.

## 2023-02-18 NOTE — Assessment & Plan Note (Signed)
09/07/2021:Screening mammogram detected left breast asymmetry posteriorly.  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, margins negative, 0/5 sentinel lymph nodes negative, ER 0%, PR 0%, HER2 negative, Ki-67 70%   Treatment plan: Adjuvant chemotherapy with Adriamycin and Cytoxan followed by Taxol completed No role of adjuvant radiation or antiestrogen therapy ---------------------------------------------------------------------------------------------------------------------------------------------------------------------  Triple Negative Breast Cancer Completed chemotherapy, negative Nutera testing, and no signs of recurrence on physical exam. Discussed risk of recurrence within first five years post-treatment. -Continue regular Natera testing every 3-6 months. -Return for follow-up in 6 months.  Post-Chemotherapy Cognitive Impairment (Chemobrain) Reports memory fog and forgetfulness. Discussed potential role of vitamin deficiencies. -Order labs including Vitamin D and B12 levels.  Post-Chemotherapy Physical Changes Reports hair loss, nail changes, and absent eyelashes. No specific intervention discussed.  Post-Oophorectomy Hormonal Changes Reports emotional lability, likened to pregnancy. Discussed potential role of vitamin deficiencies.  -Order labs including Vitamin D and B12 levels.  General Health Maintenance -Order lipid panel. -Recommended healthy diet and exercise.

## 2023-02-23 ENCOUNTER — Encounter: Payer: Commercial Managed Care - HMO | Admitting: Rehabilitation

## 2023-02-23 ENCOUNTER — Telehealth: Payer: Self-pay | Admitting: Hematology and Oncology

## 2023-02-23 NOTE — Telephone Encounter (Signed)
Scheduled appointment per scheduling message. Patient is aware of the made appointments and is active on MyChart.

## 2023-03-04 ENCOUNTER — Encounter: Payer: Self-pay | Admitting: Plastic Surgery

## 2023-03-07 ENCOUNTER — Encounter: Payer: Self-pay | Admitting: Hematology and Oncology

## 2023-03-08 ENCOUNTER — Other Ambulatory Visit: Payer: Self-pay | Admitting: *Deleted

## 2023-03-08 DIAGNOSIS — Z171 Estrogen receptor negative status [ER-]: Secondary | ICD-10-CM

## 2023-03-08 NOTE — Progress Notes (Signed)
Signatera renewal orders successfully placed.

## 2023-03-09 ENCOUNTER — Encounter: Payer: Commercial Managed Care - HMO | Admitting: Rehabilitation

## 2023-03-15 NOTE — Progress Notes (Unsigned)
NEUROLOGY FOLLOW UP OFFICE NOTE  Julie Townsend [id number]  Assessment/Plan:   Migraine without aura, without status migrainosus, not intractable    Migraine prevention:  Emgality every 28 days *** Migraine rescue:  Ubrelvy 100mg ; sumatriptan 50mg  second line.  *** Limit use of pain relievers to no more than 2 days out of week to prevent risk of rebound or medication-overuse headache. Keep headache diary Follow up 6 months. ***       Subjective:  Julie Townsend is a 58 year old female who follows up for migraine.   UPDATE: *** Intensity:  5-6/10 Duration:  within 2 hours with Bernita Raisin (rarely takes second dose).   Frequency:  3-5 days (2 before the Emgality injection and 2 days after injection) Dull daily headaches resolved but had been on steroids and taking ibuprofen 800mg  daily.   Rescue protocol:  First line Ubrelvy, sumatriptan second line Frequency of abortive medication: ibuprofen 3 to 4 days a week.   Current NSAIDS/analgesics:  ibuprofen 800mg  Current triptans:  sumatriptan 50mg  (rarely takes it - only 2 to 3 times over past year) Current ergotamine:  none Current anti-emetic:  none Current muscle relaxants:  none Current Antihypertensive medications:  none Current Antidepressant medications:  nortriptyline 10mg  at bedtime Current Anticonvulsant medications:  none Current anti-CGRP:  Emgality, Ubrelvy 100mg  Current Vitamins/Herbal/Supplements:  Mg Current Antihistamines/Decongestants:  none Other therapy:  ice packs, dark room, rest Hormone/birth control:  estradiol   Caffeine:  1 can Coke daily Alcohol:  No Smoker:  No Diet:  Herbal tea all day.  Eats a lot of fruits and vegetables.  Little protein.  No chocolate or cheese Exercise:  routine.  Home gym Depression:  yes but mild; Anxiety:  no Other pain:  osteoarthritis in hands Sleep hygiene:  Poor as she is going through menopause   HISTORY:  She has a dull headache daily. As for  migraines: Onset:  58 years old Location:  over left eye or across forehead, maxillary pressure Quality:  stabbing Initial Intensity:  5-7/10 (rarely more severe).  She denies new headache, thunderclap headache or severe headache that wakes her from sleep. Aura:  absent Prodrome:  absent Associated symptoms:  Nausea, photophobia, phonophobia, irritable.  She denies associated vomiting, autonomic symptoms, visual disturbance, unilateral numbness or weakness. Initial Duration:  24-48 hours (if takes sumatriptan, will take a nap for 45 min to 3 hours and wakes up resolved)  Typically wakes up with it. Initial Frequency:  once a week from 1 to 2 days (3 days before the next Emgality shot she has a severe migraine - she had a lot more migraine days prior to [employer]   Frequency of abortive medication: 1  day a week  Triggers:  change in barometric pressure Relieving factors:  rest, ice pack, heat, sleep Activity:  movement aggravates       Past NSAIDS/analgesics:  Sprix NS, Fioricet, Excedrin Migraine Past abortive triptans:  Axert, Maxalt, Relpax, Zomig Past abortive ergotamine:  none Past muscle relaxants:  none Past anti-emetic:  none Past antihypertensive medications:  verapamil Past antidepressant medications:  amitriptyline Past anticonvulsant medications:  Topiramate, Depakote Past anti-CGRP:  Aimovig, Nurtec (rescue) Past vitamins/Herbal/Supplements:  Mg, B2 Past antihistamines/decongestants:  none Other past therapies:  none     Family history of migraines:  daughter, mother, maternal grandmother, maternal great-grandmother, sister  PAST MEDICAL HISTORY: Past Medical History:  Diagnosis Date   BRCA2 gene mutation positive 09/26/2021   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  Report date is September 26, 2021.    The BRCAplus panel offered by [employer] and includes sequencing and deletion/duplication analysis for the following 8 genes: ATM, BRCA1, BRCA2, CDH1, CHEK2, PALB2, PTEN, and TP53.  Results of pan-cancer panel are pending.     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 (Patient not taking: Reported on 12/28/2022) 1 each 1   ergocalciferol (VITAMIN D2) 1.25 MG (50000 UT) capsule Take 1 capsule (50,000 Units total) by mouth once a week. 4 capsule 1   estradiol (ESTRACE) 0.1 MG/GM vaginal cream Place 4 g vaginally 2 (two) times a week.     Galcanezumab-gnlm (EMGALITY) 120 MG/ML SOAJ Inject 120 mg into the skin every 28 (twenty-eight) days. 1 mL 11   ibuprofen (ADVIL) 200 MG tablet Take 600 mg by mouth every 6 (six) hours as needed for moderate pain or headache.     ondansetron (ZOFRAN) 4 MG tablet Take 1 tablet (4 mg total) by mouth every 8 (eight) hours as needed for up to 20 doses for nausea or vomiting. 20 tablet 0   oxyCODONE (ROXICODONE) 5 MG immediate release tablet Take 1 tablet (5 mg total) by mouth every 6 (six) hours as needed for up to 20 doses for severe pain. 20 tablet 0   senna-docusate (SENOKOT-S) 8.6-50 MG tablet Take 2 tablets by mouth at bedtime. For AFTER surgery, do not take if having diarrhea 30 tablet 0   SUMAtriptan (IMITREX) 50 MG tablet TAKE ONE TABLET BY MOUTH AS NEEDED FOR MIGRAINE, MAY REPEAT IN TWO HOURS IF HEAD ACHE PERSISTS OR RECURS. MAY TAKE UP TO TWO TABLETS IN 24 HOURS. 45 tablet 0   Ubrogepant (UBRELVY) 100 MG TABS TAKE 1 TABLET AT THE EARLIST  ONSET OF A MIGRAINE. MAY REPEAT IN 2 HOURS. MAX 2/24 HOURS. 16 tablet 11   No current facility-administered medications on file prior to visit.    ALLERGIES: Allergies  Allergen Reactions   Bee Venom Anaphylaxis    Yellow jackets only   Gadavist [Gadobutrol] Nausea And Vomiting    Patient began vomiting immed post gad injection.     FAMILY HISTORY: Family History  Problem Relation Age of Onset   Migraines Mother    Breast cancer Mother 54       recurr at  72   Migraines Maternal Aunt    Colon cancer Maternal Uncle        dx late 60s   Pancreatic cancer Maternal Uncle 28   Migraines Maternal Grandmother    Ovarian cancer Maternal Grandmother        dx 69s      Objective:  ***. General: No acute distress.  Patient appears well-groomed.   ***   Shon Millet, DO

## 2023-03-16 ENCOUNTER — Encounter: Payer: Self-pay | Admitting: Neurology

## 2023-03-16 ENCOUNTER — Ambulatory Visit: Payer: Commercial Managed Care - HMO | Admitting: Neurology

## 2023-03-16 VITALS — BP 138/85 | Ht 62.0 in | Wt 113.0 lb

## 2023-03-16 DIAGNOSIS — G43009 Migraine without aura, not intractable, without status migrainosus: Secondary | ICD-10-CM

## 2023-03-16 MED ORDER — SUMATRIPTAN SUCCINATE 50 MG PO TABS: 50.0000 mg | ORAL_TABLET | ORAL | 1 refills | Status: AC | PRN

## 2023-03-16 MED ORDER — UBRELVY 100 MG PO TABS: ORAL_TABLET | ORAL | 11 refills | Status: DC

## 2023-03-16 MED ORDER — EMGALITY 120 MG/ML ~~LOC~~ SOAJ: 120.0000 mg | SUBCUTANEOUS | 11 refills | Status: DC

## 2023-03-16 NOTE — Patient Instructions (Signed)
Help finding a primary care provider within [place]. If you do not have access to a computer or the Internet you can call [phone] and  they will help you scheduled with a provider.  Or you can go to: [email or web address]

## 2023-03-18 LAB — SIGNATERA
SIGNATERA MTM READOUT: 0 MTM/ml
SIGNATERA TEST RESULT: NEGATIVE

## 2023-03-23 ENCOUNTER — Telehealth: Payer: Self-pay

## 2023-03-23 NOTE — Telephone Encounter (Signed)
Attempted to call pt regarding signatera results lvm for pt that results was negative and if she had any questions or concerns she could give [place] a call back.

## 2023-04-04 ENCOUNTER — Other Ambulatory Visit: Payer: Self-pay

## 2023-04-04 ENCOUNTER — Telehealth: Payer: Self-pay | Admitting: Gastroenterology

## 2023-04-04 DIAGNOSIS — Z800 Family history of malignant neoplasm of digestive organs: Secondary | ICD-10-CM

## 2023-04-04 NOTE — Telephone Encounter (Signed)
 EUS has been set up for 05/30/23 at 145 pm at [facility] with GM

## 2023-04-04 NOTE — Telephone Encounter (Signed)
 Patient called and stated that she would like to schedule her next EUS procedure. Patient is requesting a call back. Please advise.

## 2023-04-05 NOTE — Telephone Encounter (Signed)
 EUS scheduled, pt instructed and medications reviewed.  Patient instructions mailed to home.  Patient to call with any questions or concerns.

## 2023-05-03 ENCOUNTER — Encounter (HOSPITAL_BASED_OUTPATIENT_CLINIC_OR_DEPARTMENT_OTHER): Payer: Self-pay | Admitting: Internal Medicine

## 2023-05-10 NOTE — Progress Notes (Unsigned)
 Patient ID: Julie Townsend, female    DOB: Nov 10, 1965, 58 y.o.   MRN: [id number]  No chief complaint on file.   No diagnosis found.   History of Present Illness: Julie Townsend is a 58 y.o.  female  with a history of right-sided breast reconstruction with latissimus muscle flap performed 07/26/2022.  She presents for preoperative evaluation for upcoming procedure, ***, scheduled for *** with Dr. {[id number]::"DGUY","[id number]"}.  The patient {HAS HAS GLO:[id number]} had problems with anesthesia. ***  Summary of Previous Visit: ***  Job: ***  PMH Significant for: Left-sided breast cancer s/p bilateral mastectomy with immediate expander based reconstruction 10/29/2021 complicated by seromas leading to expander removals and latissimus muscle flap 07/2022, chemotherapy treatments, carpal tunnel syndrome.   Past Medical History: Allergies: Allergies  Allergen Reactions   Bee Venom Anaphylaxis    Yellow jackets only   Gadavist [Gadobutrol] Nausea And Vomiting    Patient began vomiting immed post gad injection.     Current Medications:  Current Outpatient Medications:    EPINEPHrine 0.3 mg/0.3 mL IJ SOAJ injection, Inject 0.3 mg into the muscle as needed for anaphylaxis., Disp: 1 each, Rfl: 1   ergocalciferol (VITAMIN D2) 1.25 MG (50000 UT) capsule, Take 1 capsule (50,000 Units total) by mouth once a week., Disp: 4 capsule, Rfl: 1   estradiol (ESTRACE) 0.1 MG/GM vaginal cream, Place 4 g vaginally 2 (two) times a week., Disp: , Rfl:    Galcanezumab-gnlm (EMGALITY) 120 MG/ML SOAJ, Inject 120 mg into the skin every 28 (twenty-eight) days., Disp: 1 mL, Rfl: 11   ibuprofen (ADVIL) 200 MG tablet, Take 600 mg by mouth every 6 (six) hours as needed for moderate pain or headache., Disp: , Rfl:    SUMAtriptan (IMITREX) 50 MG tablet, Take 1 tablet (50 mg total) by mouth as needed for migraine. May repeat in 2 hours if headache persists or recurs., Disp: 45 tablet, Rfl: 1    Ubrogepant (UBRELVY) 100 MG TABS, TAKE 1 TABLET AT THE EARLIST ONSET OF A MIGRAINE. MAY REPEAT IN 2 HOURS. MAX 2/24 HOURS., Disp: 16 tablet, Rfl: 11  Past Medical Problems: Past Medical History:  Diagnosis Date   BRCA2 gene mutation positive 09/26/2021   Breast cancer (HCC) 09/2021   left breast IDC   Family history of breast cancer 09/22/2021   Family history of colon cancer    Family history of ovarian cancer 09/22/2021   Family history of pancreatic cancer 09/22/2021   Family history of prostate cancer    Genetic testing 09/26/2021   Pathogenic variant in BRCA2 at p.P2951* (c.3922G>T).  Report date is September 26, 2021.    The BRCAplus panel offered by [employer] and includes sequencing and deletion/duplication analysis for the following 8 genes: ATM, BRCA1, BRCA2, CDH1, CHEK2, PALB2, PTEN, and TP53.  Results of pan-cancer panel are pending.     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    correct Hands   Port-A-Cath in place 03/10/2022    Past Surgical History: Past Surgical History:  Procedure Laterality Date   BREAST ENHANCEMENT SURGERY Bilateral 10/28/2022   BREAST IMPLANT REMOVAL Right 12/17/2021   Procedure: Right breast expander removal with washout;  Surgeon: Peggye Form, DO;  Location: [facility];  Service: Plastics;  Laterality: Right;   BREAST RECONSTRUCTION WITH PLACEMENT OF TISSUE EXPANDER AND FLEX HD (ACELLULAR HYDRATED DERMIS) Bilateral 10/29/2021   Procedure: BREAST RECONSTRUCTION WITH PLACEMENT OF TISSUE EXPANDER AND FLEX HD (ACELLULAR  HYDRATED DERMIS);  Surgeon: Peggye Form, DO;  Location: [facility];  Service: Plastics;  Laterality: Bilateral;   BREAST RECONSTRUCTION WITH PLACEMENT OF TISSUE EXPANDER AND FLEX HD (ACELLULAR HYDRATED DERMIS) Right 07/26/2022   Procedure: PLACEMENT OF TISSUE EXPANDER;  Surgeon: Peggye Form, DO;  Location: [facility];  Service: Plastics;  Laterality: Right;    CARPAL TUNNEL RELEASE Right 06/20/2020   Procedure: CARPAL TUNNEL RELEASE;  Surgeon: Betha Loa, MD;  Location: [facility];  Service: Orthopedics;  Laterality: Right;   DILATION AND CURETTAGE OF UTERUS     IRRIGATION AND DEBRIDEMENT OF WOUND WITH SPLIT THICKNESS SKIN GRAFT Right 03/15/2022   Procedure: Debridement of right breast;  Surgeon: Peggye Form, DO;  Location: [facility];  Service: Plastics;  Laterality: Right;   LATISSIMUS FLAP TO BREAST Right 07/26/2022   Procedure: Right breast reconstruction and latissimus muscle flap;  Surgeon: Peggye Form, DO;  Location: [facility];  Service: Plastics;  Laterality: Right;   MASTECTOMY     MASTECTOMY W/ SENTINEL NODE BIOPSY Left 10/29/2021   Procedure: LEFT MASTECTOMY WITH LEFT SENTINEL LYMPH NODE BIOPSY;  Surgeon: Abigail Miyamoto, MD;  Location: [facility];  Service: General;  Laterality: Left;   PORT-A-CATH REMOVAL N/A 07/26/2022   Procedure: PORT-A-CATH REMOVAL;  Surgeon: Abigail Miyamoto, MD;  [facility];  Service: General;  Laterality: N/A;   PORTACATH PLACEMENT Right 10/29/2021   Procedure: INSERTION PORT-A-CATH;  Surgeon: Abigail Miyamoto, MD;  Location: [facility];  Service: General;  Laterality: Right;   REMOVAL OF TISSUE EXPANDER AND PLACEMENT OF IMPLANT Bilateral 10/28/2022   Procedure: REMOVAL OF TISSUE EXPANDER AND PLACEMENT OF IMPLANT,  repair of tissue defect to right lateral chest wall;  Surgeon: Peggye Form, DO;  Location: [facility];  Service: Plastics;  Laterality: Bilateral;   ROBOTIC ASSISTED SALPINGO OOPHERECTOMY Bilateral 09/15/2022   Procedure: XI ROBOTIC ASSISTED SALPINGO OOPHORECTOMY;  Surgeon: Carver Fila, MD;  Location: [facility];  Service: Gynecology;  Laterality: Bilateral;   TOTAL MASTECTOMY Right 10/29/2021   Procedure: RIGHT TOTAL MASTECTOMY;  Surgeon: Abigail Miyamoto, MD;  Location: [facility];  Service:  General;  Laterality: Right;   TRIGGER FINGER RELEASE Left 05/08/2020   Procedure: LEFT LONG FINGER TRIGGER RELEASE AND LEFT RING FINGER TRIGGER RELEASE;  Surgeon: Betha Loa, MD;  Location: [facility];  Service: Orthopedics;  Laterality: Left;   trigger finger release rt hand      Social History: Social History   Socioeconomic History   Marital status: Married    Spouse name: Not on file   Number of children: 2   Years of education: Not on file   Highest education level: Not on file  Occupational History   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   Right handed   Social Drivers of Health   Financial Resource Strain: Not on file  Food Insecurity: Not on file  Transportation Needs: Not on file  Physical Activity: Not on file  Stress: Not on  file  Social Connections: Not on file  Intimate Partner Violence: Not on file    Family History: Family History  Problem Relation Age of Onset   Migraines Mother    Breast cancer Mother 27       recurr at  69   Migraines Maternal Aunt    Colon cancer Maternal Uncle        dx late 60s   Pancreatic cancer Maternal Uncle 28   Migraines Maternal Grandmother    Ovarian cancer Maternal Grandmother        dx 66s    Review of Systems: ROS  Physical Exam: Vital Signs There were no vitals taken for this visit.  Physical Exam *** Constitutional:      General: Not in acute distress.    Appearance: Normal appearance. Not ill-appearing.  HENT:     Head: Normocephalic and atraumatic.  Eyes:     Pupils: Pupils are equal,  round. Cardiovascular:     Rate and Rhythm: Normal rate.    Pulses: Normal pulses.  Pulmonary:     Effort: No respiratory distress or increased work of breathing.  Speaks in full sentences. Abdominal:     General: Abdomen is flat. No distension.   Musculoskeletal: Normal range of motion. No lower extremity swelling or edema. No varicosities. *** Skin:    General: Skin is warm and dry.     Findings: No erythema or rash.  Neurological:     Mental Status: Alert and oriented to person, place, and time.  Psychiatric:        Mood and Affect: Mood normal.        Behavior: Behavior normal.    Assessment/Plan: The patient is scheduled for *** with Dr. Jenean Lindau.  Risks, benefits, and alternatives of procedure discussed, questions answered and consent obtained.    Smoking Status: ***; Counseling Given? *** Last Mammogram: ***; Results: ***  Caprini Score: ***; Risk Factors include: ***, BMI *** 25, and length of planned surgery. Recommendation for mechanical *** pharmacological prophylaxis. Encourage early ambulation.   Pictures obtained: ***  Post-op Rx sent to pharmacy: ***  Patient was provided with the *** General Surgical Risk consent document and Pain Medication Agreement prior to their appointment.  They had adequate time to read through the risk consent documents and Pain Medication Agreement. We also discussed them in person together during this preop appointment. All of their questions were answered to their satisfaction.  Recommended calling if they have any further questions.  Risk consent form and Pain Medication Agreement to be scanned into patient's chart.  ***   Electronically signed by: Evelena Leyden, PA-C 05/10/2023 3:42 PM

## 2023-05-10 NOTE — H&P (View-Only) (Signed)
 Patient ID: Julie Townsend, female    DOB: Nov 10, 1965, 58 y.o.   MRN: [id number]  No chief complaint on file.   No diagnosis found.   History of Present Illness: Julie Townsend is a 58 y.o.  female  with a history of right-sided breast reconstruction with latissimus muscle flap performed 07/26/2022.  She presents for preoperative evaluation for upcoming procedure, ***, scheduled for *** with Dr. {KGMWN:[id number]::"DGUY","[id number]"}.  The patient {HAS HAS GLO:[id number]} had problems with anesthesia. ***  Summary of Previous Visit: ***  Job: ***  PMH Significant for: Left-sided breast cancer s/p bilateral mastectomy with immediate expander based reconstruction 10/29/2021 complicated by seromas leading to expander removals and latissimus muscle flap 07/2022, chemotherapy treatments, carpal tunnel syndrome.   Past Medical History: Allergies: Allergies  Allergen Reactions   Bee Venom Anaphylaxis    Yellow jackets only   Gadavist [Gadobutrol] Nausea And Vomiting    Patient began vomiting immed post gad injection.     Current Medications:  Current Outpatient Medications:    EPINEPHrine 0.3 mg/0.3 mL IJ SOAJ injection, Inject 0.3 mg into the muscle as needed for anaphylaxis., Disp: 1 each, Rfl: 1   ergocalciferol (VITAMIN D2) 1.25 MG (50000 UT) capsule, Take 1 capsule (50,000 Units total) by mouth once a week., Disp: 4 capsule, Rfl: 1   estradiol (ESTRACE) 0.1 MG/GM vaginal cream, Place 4 g vaginally 2 (two) times a week., Disp: , Rfl:    Galcanezumab-gnlm (EMGALITY) 120 MG/ML SOAJ, Inject 120 mg into the skin every 28 (twenty-eight) days., Disp: 1 mL, Rfl: 11   ibuprofen (ADVIL) 200 MG tablet, Take 600 mg by mouth every 6 (six) hours as needed for moderate pain or headache., Disp: , Rfl:    SUMAtriptan (IMITREX) 50 MG tablet, Take 1 tablet (50 mg total) by mouth as needed for migraine. May repeat in 2 hours if headache persists or recurs., Disp: 45 tablet, Rfl: 1    Ubrogepant (UBRELVY) 100 MG TABS, TAKE 1 TABLET AT THE EARLIST ONSET OF A MIGRAINE. MAY REPEAT IN 2 HOURS. MAX 2/24 HOURS., Disp: 16 tablet, Rfl: 11  Past Medical Problems: Past Medical History:  Diagnosis Date   BRCA2 gene mutation positive 09/26/2021   Breast cancer (HCC) 09/2021   left breast IDC   Family history of breast cancer 09/22/2021   Family history of colon cancer    Family history of ovarian cancer 09/22/2021   Family history of pancreatic cancer 09/22/2021   Family history of prostate cancer    Genetic testing 09/26/2021   Pathogenic variant in BRCA2 at p.P2951* (c.3922G>T).  Report date is September 26, 2021.    The BRCAplus panel offered by [employer] and includes sequencing and deletion/duplication analysis for the following 8 genes: ATM, BRCA1, BRCA2, CDH1, CHEK2, PALB2, PTEN, and TP53.  Results of pan-cancer panel are pending.     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    correct Hands   Port-A-Cath in place 03/10/2022    Past Surgical History: Past Surgical History:  Procedure Laterality Date   BREAST ENHANCEMENT SURGERY Bilateral 10/28/2022   BREAST IMPLANT REMOVAL Right 12/17/2021   Procedure: Right breast expander removal with washout;  Surgeon: Peggye Form, DO;  Location: [facility];  Service: Plastics;  Laterality: Right;   BREAST RECONSTRUCTION WITH PLACEMENT OF TISSUE EXPANDER AND FLEX HD (ACELLULAR HYDRATED DERMIS) Bilateral 10/29/2021   Procedure: BREAST RECONSTRUCTION WITH PLACEMENT OF TISSUE EXPANDER AND FLEX HD (ACELLULAR  HYDRATED DERMIS);  Surgeon: Peggye Form, DO;  Location: [facility];  Service: Plastics;  Laterality: Bilateral;   BREAST RECONSTRUCTION WITH PLACEMENT OF TISSUE EXPANDER AND FLEX HD (ACELLULAR HYDRATED DERMIS) Right 07/26/2022   Procedure: PLACEMENT OF TISSUE EXPANDER;  Surgeon: Peggye Form, DO;  Location: [facility];  Service: Plastics;  Laterality: Right;    CARPAL TUNNEL RELEASE Right 06/20/2020   Procedure: CARPAL TUNNEL RELEASE;  Surgeon: Betha Loa, MD;  Location: [facility];  Service: Orthopedics;  Laterality: Right;   DILATION AND CURETTAGE OF UTERUS     IRRIGATION AND DEBRIDEMENT OF WOUND WITH SPLIT THICKNESS SKIN GRAFT Right 03/15/2022   Procedure: Debridement of right breast;  Surgeon: Peggye Form, DO;  Location: [facility];  Service: Plastics;  Laterality: Right;   LATISSIMUS FLAP TO BREAST Right 07/26/2022   Procedure: Right breast reconstruction and latissimus muscle flap;  Surgeon: Peggye Form, DO;  Location: [facility];  Service: Plastics;  Laterality: Right;   MASTECTOMY     MASTECTOMY W/ SENTINEL NODE BIOPSY Left 10/29/2021   Procedure: LEFT MASTECTOMY WITH LEFT SENTINEL LYMPH NODE BIOPSY;  Surgeon: Abigail Miyamoto, MD;  Location: [facility];  Service: General;  Laterality: Left;   PORT-A-CATH REMOVAL N/A 07/26/2022   Procedure: PORT-A-CATH REMOVAL;  Surgeon: Abigail Miyamoto, MD;  Location[facility];  Service: General;  Laterality: N/A;   PORTACATH PLACEMENT Right 10/29/2021   Procedure: INSERTION PORT-A-CATH;  Surgeon: Abigail Miyamoto, MD;  Location: [facility];  Service: General;  Laterality: Right;   REMOVAL OF TISSUE EXPANDER AND PLACEMENT OF IMPLANT Bilateral 10/28/2022   Procedure: REMOVAL OF TISSUE EXPANDER AND PLACEMENT OF IMPLANT,  repair of tissue defect to right lateral chest wall;  Surgeon: Peggye Form, DO;  Location: [facility];  Service: Plastics;  Laterality: Bilateral;   ROBOTIC ASSISTED SALPINGO OOPHERECTOMY Bilateral 09/15/2022   Procedure: XI ROBOTIC ASSISTED SALPINGO OOPHORECTOMY;  Surgeon: Carver Fila, MD;  Location: [facility];  Service: Gynecology;  Laterality: Bilateral;   TOTAL MASTECTOMY Right 10/29/2021   Procedure: RIGHT TOTAL MASTECTOMY;  Surgeon: Abigail Miyamoto, MD;  Location: [facility];  Service:  General;  Laterality: Right;   TRIGGER FINGER RELEASE Left 05/08/2020   Procedure: LEFT LONG FINGER TRIGGER RELEASE AND LEFT RING FINGER TRIGGER RELEASE;  Surgeon: Betha Loa, MD;  Location: [facility];  Service: Orthopedics;  Laterality: Left;   trigger finger release rt hand      Social History: Social History   Socioeconomic History   Marital status: Married    Spouse name: Not on file   Number of children: 2   Years of education: Not on file   Highest education level: Not on file  Occupational History   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   Right handed   Social Drivers of Health   Financial Resource Strain: Not on file  Food Insecurity: Not on file  Transportation Needs: Not on file  Physical Activity: Not on file  Stress: Not on  file  Social Connections: Not on file  Intimate Partner Violence: Not on file    Family History: Family History  Problem Relation Age of Onset   Migraines Mother    Breast cancer Mother 27       recurr at  69   Migraines Maternal Aunt    Colon cancer Maternal Uncle        dx late 60s   Pancreatic cancer Maternal Uncle 28   Migraines Maternal Grandmother    Ovarian cancer Maternal Grandmother        dx 66s    Review of Systems: ROS  Physical Exam: Vital Signs There were no vitals taken for this visit.  Physical Exam *** Constitutional:      General: Not in acute distress.    Appearance: Normal appearance. Not ill-appearing.  HENT:     Head: Normocephalic and atraumatic.  Eyes:     Pupils: Pupils are equal,  round. Cardiovascular:     Rate and Rhythm: Normal rate.    Pulses: Normal pulses.  Pulmonary:     Effort: No respiratory distress or increased work of breathing.  Speaks in full sentences. Abdominal:     General: Abdomen is flat. No distension.   Musculoskeletal: Normal range of motion. No lower extremity swelling or edema. No varicosities. *** Skin:    General: Skin is warm and dry.     Findings: No erythema or rash.  Neurological:     Mental Status: Alert and oriented to person, place, and time.  Psychiatric:        Mood and Affect: Mood normal.        Behavior: Behavior normal.    Assessment/Plan: The patient is scheduled for *** with Dr. Jenean Lindau.  Risks, benefits, and alternatives of procedure discussed, questions answered and consent obtained.    Smoking Status: ***; Counseling Given? *** Last Mammogram: ***; Results: ***  Caprini Score: ***; Risk Factors include: ***, BMI *** 25, and length of planned surgery. Recommendation for mechanical *** pharmacological prophylaxis. Encourage early ambulation.   Pictures obtained: ***  Post-op Rx sent to pharmacy: ***  Patient was provided with the *** General Surgical Risk consent document and Pain Medication Agreement prior to their appointment.  They had adequate time to read through the risk consent documents and Pain Medication Agreement. We also discussed them in person together during this preop appointment. All of their questions were answered to their satisfaction.  Recommended calling if they have any further questions.  Risk consent form and Pain Medication Agreement to be scanned into patient's chart.  ***   Electronically signed by: Evelena Leyden, PA-C 05/10/2023 3:42 PM

## 2023-05-11 ENCOUNTER — Ambulatory Visit: Payer: Commercial Managed Care - HMO | Admitting: Physician Assistant

## 2023-05-11 VITALS — BP 135/90 | HR 71 | Wt 114.0 lb

## 2023-05-11 DIAGNOSIS — Z171 Estrogen receptor negative status [ER-]: Secondary | ICD-10-CM

## 2023-05-11 DIAGNOSIS — Z9011 Acquired absence of right breast and nipple: Secondary | ICD-10-CM

## 2023-05-11 DIAGNOSIS — C50512 Malignant neoplasm of lower-outer quadrant of left female breast: Secondary | ICD-10-CM

## 2023-05-11 MED ORDER — ONDANSETRON 4 MG PO TBDP: 4.0000 mg | ORAL_TABLET | Freq: Three times a day (TID) | ORAL | 0 refills | Status: DC | PRN

## 2023-05-11 MED ORDER — OXYCODONE HCL 5 MG PO TABS: 5.0000 mg | ORAL_TABLET | Freq: Three times a day (TID) | ORAL | 0 refills | Status: AC | PRN

## 2023-05-11 MED ORDER — CEPHALEXIN 500 MG PO CAPS: 500.0000 mg | ORAL_CAPSULE | Freq: Four times a day (QID) | ORAL | 0 refills | Status: AC

## 2023-05-12 ENCOUNTER — Telehealth: Payer: Self-pay | Admitting: Plastic Surgery

## 2023-05-12 NOTE — Telephone Encounter (Signed)
 Did anyone call this pt, she has sx in May, she said there was no vmail left, I dont see any messages in her chart. If anyone called , she would like a call back please

## 2023-05-12 NOTE — Telephone Encounter (Signed)
 Yes, called her back and it's all take care of. Thank you

## 2023-05-18 ENCOUNTER — Inpatient Hospital Stay: Payer: Commercial Managed Care - HMO | Attending: Hematology and Oncology

## 2023-05-18 DIAGNOSIS — Z79899 Other long term (current) drug therapy: Secondary | ICD-10-CM | POA: Diagnosis not present

## 2023-05-18 DIAGNOSIS — E538 Deficiency of other specified B group vitamins: Secondary | ICD-10-CM | POA: Insufficient documentation

## 2023-05-18 DIAGNOSIS — Z853 Personal history of malignant neoplasm of breast: Secondary | ICD-10-CM | POA: Insufficient documentation

## 2023-05-18 DIAGNOSIS — E559 Vitamin D deficiency, unspecified: Secondary | ICD-10-CM

## 2023-05-18 LAB — VITAMIN B12: Vitamin B-12: 386 pg/mL (ref 180–914)

## 2023-05-19 LAB — MISC LABCORP TEST (SEND OUT): Labcorp test code: 81950

## 2023-05-24 ENCOUNTER — Telehealth: Payer: Self-pay | Admitting: *Deleted

## 2023-05-24 ENCOUNTER — Telehealth: Payer: Self-pay

## 2023-05-24 ENCOUNTER — Encounter (HOSPITAL_COMMUNITY): Payer: Self-pay | Admitting: Gastroenterology

## 2023-05-24 NOTE — Telephone Encounter (Signed)
 Per Alwin Baars, called pt with message below. Pt verbalized understanding.

## 2023-05-24 NOTE — Telephone Encounter (Signed)
-----   Message from Conway Dennis sent at 05/24/2023 11:59 AM EDT ----- No worries.  I am not sure I recommended anything at the time--a  lot of times if patients aren't taking anything--we will see what she is currently and go from there.    I recommend Vitamin d3 2000 units daily, over the counter ----- Message ----- From: Reggie Caper, LPN Sent: 05/10/8117   9:03 AM EDT To: Percival Brace, NP  No she's not taking it. She said she wasn't sure if she was suppose to take an OTC or what. She didn't have any direction ----- Message ----- From: Percival Brace, NP Sent: 05/23/2023  12:17 PM EDT To: [facility] 4  Is patient taking vitamin d ? ----- Message ----- From: Interface, Lab In [place] Sent: 05/18/2023   3:16 PM EDT To: Percival Brace, NP

## 2023-05-24 NOTE — Telephone Encounter (Signed)
 Procedure:upper endo Procedure date: 05/30/23 Procedure location: wl Arrival Time: 1015 pt states her appointment time was move to 1145am Spoke with the patient Y/N: y Any prep concerns? n  Has the patient obtained the prep from the pharmacy ? n Do you have a care partner and transportation: y Any additional concerns? n

## 2023-05-26 ENCOUNTER — Encounter (HOSPITAL_BASED_OUTPATIENT_CLINIC_OR_DEPARTMENT_OTHER): Payer: Self-pay | Admitting: Plastic Surgery

## 2023-05-26 ENCOUNTER — Other Ambulatory Visit: Payer: Self-pay

## 2023-05-30 ENCOUNTER — Encounter (HOSPITAL_COMMUNITY): Payer: Self-pay | Admitting: Gastroenterology

## 2023-05-30 ENCOUNTER — Ambulatory Visit (HOSPITAL_COMMUNITY): Admitting: Anesthesiology

## 2023-05-30 ENCOUNTER — Encounter (HOSPITAL_COMMUNITY)
Admission: RE | Disposition: A | Discharge status: Home / Self Care | Payer: Self-pay | Source: Home / Self Care | Attending: Gastroenterology

## 2023-05-30 ENCOUNTER — Ambulatory Visit (HOSPITAL_COMMUNITY)
Admission: RE | Admit: 2023-05-30 | Discharge: 2023-05-30 | Disposition: A | Discharge status: Home / Self Care | Attending: Gastroenterology | Admitting: Gastroenterology

## 2023-05-30 DIAGNOSIS — Z1509 Genetic susceptibility to other malignant neoplasm: Secondary | ICD-10-CM | POA: Diagnosis not present

## 2023-05-30 DIAGNOSIS — K449 Diaphragmatic hernia without obstruction or gangrene: Secondary | ICD-10-CM | POA: Insufficient documentation

## 2023-05-30 DIAGNOSIS — Z129 Encounter for screening for malignant neoplasm, site unspecified: Secondary | ICD-10-CM

## 2023-05-30 DIAGNOSIS — B9681 Helicobacter pylori [H. pylori] as the cause of diseases classified elsewhere: Secondary | ICD-10-CM

## 2023-05-30 DIAGNOSIS — K2970 Gastritis, unspecified, without bleeding: Secondary | ICD-10-CM | POA: Diagnosis not present

## 2023-05-30 DIAGNOSIS — K7689 Other specified diseases of liver: Secondary | ICD-10-CM | POA: Insufficient documentation

## 2023-05-30 DIAGNOSIS — Q453 Other congenital malformations of pancreas and pancreatic duct: Secondary | ICD-10-CM | POA: Diagnosis not present

## 2023-05-30 DIAGNOSIS — Z800 Family history of malignant neoplasm of digestive organs: Secondary | ICD-10-CM | POA: Diagnosis not present

## 2023-05-30 DIAGNOSIS — K3189 Other diseases of stomach and duodenum: Secondary | ICD-10-CM | POA: Insufficient documentation

## 2023-05-30 DIAGNOSIS — K2950 Unspecified chronic gastritis without bleeding: Secondary | ICD-10-CM

## 2023-05-30 DIAGNOSIS — K2289 Other specified disease of esophagus: Secondary | ICD-10-CM | POA: Insufficient documentation

## 2023-05-30 DIAGNOSIS — K8020 Calculus of gallbladder without cholecystitis without obstruction: Secondary | ICD-10-CM

## 2023-05-30 DIAGNOSIS — Z1289 Encounter for screening for malignant neoplasm of other sites: Secondary | ICD-10-CM | POA: Diagnosis present

## 2023-05-30 DIAGNOSIS — K869 Disease of pancreas, unspecified: Secondary | ICD-10-CM | POA: Diagnosis not present

## 2023-05-30 DIAGNOSIS — I899 Noninfective disorder of lymphatic vessels and lymph nodes, unspecified: Secondary | ICD-10-CM

## 2023-05-30 DIAGNOSIS — Z1501 Genetic susceptibility to malignant neoplasm of breast: Secondary | ICD-10-CM | POA: Diagnosis not present

## 2023-05-30 HISTORY — PX: ESOPHAGOGASTRODUODENOSCOPY: SHX5428

## 2023-05-30 HISTORY — PX: BONE BIOPSY: SHX375

## 2023-05-30 HISTORY — PX: EUS: SHX5427

## 2023-05-30 SURGERY — UPPER ENDOSCOPIC ULTRASOUND (EUS) RADIAL
Anesthesia: Monitor Anesthesia Care

## 2023-05-30 MED ORDER — LIDOCAINE VISCOUS HCL 2 % MT SOLN
15.0000 mL | Freq: Once | OROMUCOSAL | Status: AC
  Administered 2023-05-30: 15 mL via OROMUCOSAL

## 2023-05-30 MED ORDER — SODIUM CHLORIDE 0.9 % IV SOLN
INTRAVENOUS | Status: AC | PRN
  Administered 2023-05-30: 500 mL via INTRAMUSCULAR

## 2023-05-30 MED ORDER — PROPOFOL 500 MG/50ML IV EMUL
INTRAVENOUS | Status: DC | PRN
  Administered 2023-05-30: 50 ug via INTRAVENOUS
  Administered 2023-05-30: 50 mg via INTRAVENOUS
  Administered 2023-05-30: 50 ug via INTRAVENOUS
  Administered 2023-05-30: 125 ug/kg/min via INTRAVENOUS

## 2023-05-30 MED ORDER — PHENOL 1.4 % MT LIQD
1.0000 | OROMUCOSAL | Status: DC | PRN
  Filled 2023-05-30: qty 177

## 2023-05-30 MED ORDER — PROPOFOL 500 MG/50ML IV EMUL
INTRAVENOUS | Status: AC
  Filled 2023-05-30: qty 100

## 2023-05-30 MED ORDER — LIDOCAINE 2% (20 MG/ML) 5 ML SYRINGE
INTRAMUSCULAR | Status: DC | PRN
  Administered 2023-05-30: 60 mg via INTRAVENOUS

## 2023-05-30 MED ORDER — SODIUM CHLORIDE 0.9 % IV SOLN: INTRAVENOUS | Status: DC

## 2023-05-30 MED ORDER — LIDOCAINE VISCOUS HCL 2 % MT SOLN
OROMUCOSAL | Status: AC
  Filled 2023-05-30: qty 15

## 2023-05-30 NOTE — Op Note (Signed)
 [facility] Patient Name: Julie Townsend Procedure Date: 05/30/2023 MRN: [id number] Attending MD: Yong Henle , MD, [id number] Date of Birth: 1965-04-25 CSN: [id number] Age: 58 Admit Type: Outpatient Procedure:                Upper EUS Indications:              Initial EUS exam for pancreatic cancer screening in                            high-risk patient, Genetic mutation associated with                            pancreatic cancer risk identified; BRCA2 mutation                            positive, Family history of pancreatic ductal                            adenocarcinoma in other relative Providers:                Yong Henle, MD, Ambrose Junk, RN, Marvelyn Slim, Technician, Janie Billups, Technician Referring MD:              Medicines:                Monitored Anesthesia Care Complications:            No immediate complications. Estimated Blood Loss:     Estimated blood loss was minimal. Procedure:                Pre-Anesthesia Assessment:                           - Prior to the procedure, a History and Physical                            was performed, and patient medications and                            allergies were reviewed. The patient's tolerance of                            previous anesthesia was also reviewed. The risks                            and benefits of the procedure and the sedation                            options and risks were discussed with the patient.                            All questions were answered, and informed consent  was obtained. Prior Anticoagulants: The patient has                            taken no anticoagulant or antiplatelet agents                            except for NSAID medication. ASA Grade Assessment:                            II - A patient with mild systemic disease. After                            reviewing the risks and  benefits, the patient was                            deemed in satisfactory condition to undergo the                            procedure.                           After obtaining informed consent, the endoscope was                            passed under direct vision. Throughout the                            procedure, the patient's blood pressure, pulse, and                            oxygen saturations were monitored continuously. The                            [id number]-[id number] ([id number]) Olympus endoscope was introduced                            through the mouth, and advanced to the second part                            of duodenum. The [id number]-[id number] ([id number]) Olympus                            duodenoscope was introduced through the mouth, and                            advanced to the area of papilla. The [id number]                            ([id number]) Olympus linear ultrasound scope was                            introduced through the mouth, and advanced to the  duodenum for ultrasound examination from the                            stomach and duodenum. The upper EUS was                            accomplished without difficulty. The patient                            tolerated the procedure. Scope In: Scope Out: Findings:      ENDOSCOPIC FINDING: :      No gross lesions were noted in the entire esophagus.      The Z-line was irregular and was found 37 cm from the incisors.      A 2 cm hiatal hernia was present.      Diffuse moderately erythematous mucosa without bleeding was found in the       cardia, in the gastric fundus and in the gastric body.      No gross lesions were noted in the entire examined stomach. Biopsies       were taken with a cold forceps for histology and Helicobacter pylori       testing.      No gross lesions were noted in the duodenal bulb, in the first portion       of the duodenum and in the second portion of the duodenum.      The  major papilla was normal.      ENDOSONOGRAPHIC FINDING: :      Pancreatic parenchymal abnormalities were noted in the entire pancreas.       These consisted of hyperechoic strands. There is no evidence of any mass       or lesion within the pancreas itself.      There appears to be evidence of pancreatic divisum. The diameter of the       main pancreatic duct (MPD) measured:      - HOP 1.6 -> 2.1 mm (head of pancreas)      - NOP 2.3 -> 1.7 mm (neck of pancreas)      - BOP 1.8 mm (body of the pancreas)      - TOP 0.9 -> 0.6 mm (tail of the pancreas).      There was no sign of significant endosonographic abnormality in the       common bile duct (2.6 -> 4.3 mm) and in the common hepatic duct (4.7       mm). No stones, no biliary sludge, ducts of normal caliber and ducts       with regular contour were identified.      Multiple stones were visualized endosonographically in the gallbladder.       The stones were round. They were hyperechoic and characterized by       shadowing.      Endosonographic imaging of the minor papilla showed no intramural       (subepithelial) lesion.      Endosonographic imaging of the major ampulla showed no intramural       (subepithelial) lesion.      A cyst was found in the visualized portion of the liver and measured 11       mm by 10 mm in maximal cross-sectional diameter. The cyst was anechoic.       It was  without septae. The outer wall of the lesion was not seen.      Endosonographic imaging in the rest of the visualized portion of the       liver showed no mass.      No malignant-appearing lymph nodes were visualized in the celiac region       (level 20), peripancreatic region and porta hepatis region.      The celiac region was visualized. Impression:               EGD impression:                           - No gross lesions in the entire esophagus. Z-line                            irregular, 37 cm from the incisors.                           - 2 cm  hiatal hernia.                           - Erythematous mucosa in the cardia, gastric fundus                            and gastric body. No other gross lesions in the                            entire stomach. Biopsied.                           - No gross lesions in the duodenal bulb, in the                            first portion of the duodenum and in the second                            portion of the duodenum.                           - Normal major papilla.                           EUS impression:                           - Pancreatic parenchymal abnormalities consisting                            of hyperechoic strands were noted in the entire                            pancreas. No evidence of any mass or lesion or cyst                            noted within the pancreas otherwise.                           -  Main pancreatic duct (MPD) diameter was measured.                            Endosonographically, the MPD had a normal                            appearance and ductal caliber. However there does                            appear to be pancreatic divisum.                           - There was no sign of significant pathology in the                            common bile duct and in the common hepatic duct.                           - Multiple stones were visualized                            endosonographically in the gallbladder.                           - A cyst was found in the visualized portion of the                            liver and measured 11 mm by 10 mm. No other                            evidence of any mass or lesion noted in the                            visualized liver.                           - No malignant-appearing lymph nodes were                            visualized in the celiac region (level 20),                            peripancreatic region and porta hepatis region. Moderate Sedation:      Not Applicable - Patient had care per  Anesthesia. Recommendation:           - The patient will be observed post-procedure,                            until all discharge criteria are met.                           - Discharge patient to home.                           -  Patient has a contact number available for                            emergencies. The signs and symptoms of potential                            delayed complications were discussed with the                            patient. Return to normal activities tomorrow.                            Written discharge instructions were provided to the                            patient.                           - Resume previous diet.                           - Observe patient's clinical course.                           - Await path results.                           - For future surveillance for pancreatic cancer,                            alternating upper endoscopic ultrasound and                            cross-sectional imaging is recommended. In this                            particular case, patient's pancreas is well in                            appearance, we will plan a 1 year MRI/MRCP. If all                            is well then an EUS 1 year thereafter.                           - The findings and recommendations were discussed                            with the patient.                           - The findings and recommendations were discussed                            with the patient's family. Procedure Code(s):        ---  Professional ---                           [phone], Esophagogastroduodenoscopy, flexible,                            transoral; with endoscopic ultrasound examination                            limited to the esophagus, stomach or duodenum, and                            adjacent structures                           43239, Esophagogastroduodenoscopy, flexible,                            transoral; with biopsy, single or  multiple Diagnosis Code(s):        --- Professional ---                           K22.89, Other specified disease of esophagus                           K44.9, Diaphragmatic hernia without obstruction or                            gangrene                           K31.89, Other diseases of stomach and duodenum                           K86.9, Disease of pancreas, unspecified                           K80.20, Calculus of gallbladder without                            cholecystitis without obstruction                           K76.89, Other specified diseases of liver                           I89.9, Noninfective disorder of lymphatic vessels                            and lymph nodes, unspecified                           Z12.89, Encounter for screening for malignant                            neoplasm of other sites  Z15.09, Genetic susceptibility to other malignant                            neoplasm                           Z80.0, Family history of malignant neoplasm of                            digestive organs CPT copyright 2022 American [employer] Association. All rights reserved. The codes documented in this report are preliminary and upon coder review may  be revised to meet current compliance requirements. Yong Henle, MD 05/30/2023 1:39:33 PM Number of Addenda: 0

## 2023-05-30 NOTE — Anesthesia Preprocedure Evaluation (Signed)
 Anesthesia Evaluation  Patient identified by MRN, date of birth, ID band Patient awake    Reviewed: Allergy & Precautions, NPO status , Patient's Chart, lab work & pertinent test results  Airway Mallampati: II  TM Distance: >3 FB Neck ROM: Full    Dental  (+) Dental Advisory Given   Pulmonary neg pulmonary ROS   breath sounds clear to auscultation       Cardiovascular negative cardio ROS  Rhythm:Regular Rate:Normal     Neuro/Psych  Headaches    GI/Hepatic negative GI ROS, Neg liver ROS,,,  Endo/Other  negative endocrine ROS    Renal/GU negative Renal ROS     Musculoskeletal  (+) Arthritis ,    Abdominal   Peds  Hematology negative hematology ROS (+)   Anesthesia Other Findings   Reproductive/Obstetrics                             Anesthesia Physical Anesthesia Plan  ASA: 2  Anesthesia Plan: MAC   Post-op Pain Management:    Induction:   PONV Risk Score and Plan: 2 and Propofol  infusion  Airway Management Planned: Natural Airway and Simple Face Mask  Additional Equipment:   Intra-op Plan:   Post-operative Plan:   Informed Consent: I have reviewed the patients History and Physical, chart, labs and discussed the procedure including the risks, benefits and alternatives for the proposed anesthesia with the patient or authorized representative who has indicated his/her understanding and acceptance.       Plan Discussed with: CRNA  Anesthesia Plan Comments:        Anesthesia Quick Evaluation

## 2023-05-30 NOTE — Progress Notes (Signed)
 Patient able to be discharged with improved sore throat.  Heard from RN as patient was escorted into her car that there was a little bit of abdominal discomfort. Reasonable to use Gas-X in case this is gas related. Suspect less likely to be complication, but cannot know for absolute sure. I will have my team reach out to patient on Tuesday to see how she has done.   Yong Henle, MD [facility] Office # [phone]

## 2023-05-30 NOTE — Progress Notes (Signed)
 Patient discharged home per MD instructions.  As patient was placed in car had complaint of epigastric pain, states sharp with inhale of breath.  Patient states was "going home to take pain medication for migraine" she also verbalized understanding there would be discomfort from gas secondary to procedure. Patient was instructed to contact office if does not get better or increases once settled at home with pain medication.  MD notified.

## 2023-05-30 NOTE — H&P (Signed)
 GASTROENTEROLOGY PROCEDURE H&P NOTE   Primary Care Physician: Pcp, No  HPI: Julie Townsend is a 58 y.o. female who presents for EGD/EUS for high risk pancreas cancer screening cohort (BRCA2 plus family history pancreas cancer).  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  Report date is September 26, 2021.    The BRCAplus panel offered by [employer] and includes sequencing and deletion/duplication analysis for the following 8 genes: ATM, BRCA1, BRCA2, CDH1, CHEK2, PALB2, PTEN, and TP53.  Results of pan-cancer panel are pending.     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    correct Hands   Port-A-Cath in place 03/10/2022   Past Surgical History:  Procedure Laterality Date   BREAST ENHANCEMENT SURGERY Bilateral 10/28/2022   BREAST IMPLANT REMOVAL Right 12/17/2021   Procedure: Right breast expander removal with washout;  Surgeon: Thornell Flirt, DO;  Location: [facility];  Service: Plastics;  Laterality: Right;   BREAST RECONSTRUCTION WITH PLACEMENT OF TISSUE EXPANDER AND FLEX HD (ACELLULAR HYDRATED DERMIS) Bilateral 10/29/2021   Procedure: BREAST RECONSTRUCTION WITH PLACEMENT OF TISSUE EXPANDER AND FLEX HD (ACELLULAR HYDRATED DERMIS);  Surgeon: Thornell Flirt, DO;  Location: [facility];  Service: Plastics;  Laterality: Bilateral;   BREAST RECONSTRUCTION WITH PLACEMENT OF TISSUE EXPANDER AND FLEX HD (ACELLULAR HYDRATED DERMIS) Right 07/26/2022   Procedure: PLACEMENT OF TISSUE EXPANDER;  Surgeon: Thornell Flirt, DO;  Location:  [facility];  Service: Plastics;  Laterality: Right;   CARPAL TUNNEL RELEASE Right 06/20/2020   Procedure: CARPAL TUNNEL RELEASE;  Surgeon: Brunilda Capra, MD;  Location: [facility];  Service: Orthopedics;  Laterality: Right;   DILATION AND CURETTAGE OF UTERUS     IRRIGATION AND DEBRIDEMENT OF WOUND WITH SPLIT THICKNESS SKIN GRAFT Right 03/15/2022   Procedure: Debridement of right breast;  Surgeon: Thornell Flirt, DO;  Location: [facility];  Service: Plastics;  Laterality: Right;   LATISSIMUS FLAP TO BREAST Right 07/26/2022   Procedure: Right breast reconstruction and latissimus muscle flap;  Surgeon: Thornell Flirt, DO;  Location: [facility];  Service: Plastics;  Laterality: Right;   MASTECTOMY     MASTECTOMY W/ SENTINEL NODE BIOPSY Left 10/29/2021   Procedure: LEFT MASTECTOMY WITH LEFT SENTINEL LYMPH NODE BIOPSY;  Surgeon: Oza Blumenthal, MD;  Location: [facility];  Service: General;  Laterality: Left;   PORT-A-CATH REMOVAL N/A 07/26/2022   Procedure: PORT-A-CATH REMOVAL;  Surgeon: Oza Blumenthal, MD;  [facility];  Service: General;  Laterality: N/A;   PORTACATH PLACEMENT Right 10/29/2021   Procedure: INSERTION PORT-A-CATH;  Surgeon: Oza Blumenthal, MD;  Location: [facility];  Service: General;  Laterality: Right;   REMOVAL OF TISSUE EXPANDER AND PLACEMENT OF IMPLANT Bilateral 10/28/2022   Procedure: REMOVAL OF TISSUE EXPANDER AND PLACEMENT OF IMPLANT,  repair of tissue defect to right lateral chest wall;  Surgeon: Thornell Flirt, DO;  Location: [facility];  Service: Plastics;  Laterality: Bilateral;   ROBOTIC ASSISTED SALPINGO OOPHERECTOMY Bilateral 09/15/2022   Procedure: XI ROBOTIC  ASSISTED SALPINGO OOPHORECTOMY;  Surgeon: Suzi Essex, MD;  Location: [facility];  Service: Gynecology;  Laterality: Bilateral;   TOTAL MASTECTOMY Right 10/29/2021   Procedure: RIGHT TOTAL MASTECTOMY;  Surgeon: Oza Blumenthal, MD;   Location: [facility];  Service: General;  Laterality: Right;   TRIGGER FINGER RELEASE Left 05/08/2020   Procedure: LEFT LONG FINGER TRIGGER RELEASE AND LEFT RING FINGER TRIGGER RELEASE;  Surgeon: Brunilda Capra, MD;  Location: [facility];  Service: Orthopedics;  Laterality: Left;   trigger finger release rt hand     Current Facility-Administered Medications  Medication Dose Route Frequency Provider Last Rate Last Admin   0.9 %  sodium chloride  infusion   Intravenous Continuous Mansouraty, Layann Bluett Jr., MD       0.9 %  sodium chloride  infusion    Continuous PRN Mansouraty, Abimelec Grochowski Jr., MD 10 mL/hr at 05/30/23 1214 500 mL at 05/30/23 1214    Current Facility-Administered Medications:    0.9 %  sodium chloride  infusion, , Intravenous, Continuous, Mansouraty, Albino Alu., MD   0.9 %  sodium chloride  infusion, , , Continuous PRN, Mansouraty, Albino Alu., MD, Last Rate: 10 mL/hr at 05/30/23 1214, 500 mL at 05/30/23 1214 Allergies  Allergen Reactions   Bee Venom Anaphylaxis    Yellow jackets only   Gadavist  [Gadobutrol ] Nausea And Vomiting    Patient began vomiting immed post gad injection.    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   Right handed   Social Drivers of Corporate investment banker Strain: Not on file  Food Insecurity: Not on file  Transportation Needs: Not on file  Physical Activity: Not on file  Stress: Not on file  Social Connections: Not on file  Intimate Partner Violence: Not on file    Physical Exam: Today's Vitals   05/30/23 1152  BP: 139/85  Pulse: 69  Resp: 17  Temp: (!) 97.5 F (36.4 C)  TempSrc: Temporal  SpO2: 100%  Weight: 51.3 kg  Height: 5\' 2"  (1.575 m)  PainSc: 0-No pain   Body mass index is 20.69 kg/m. GEN: NAD EYE: Sclerae anicteric ENT: MMM CV: Non-tachycardic GI: Soft, NT/ND NEURO:  Alert & Oriented x 3  Lab Results: No results for input(s): "WBC", "HGB", "HCT", "PLT" in the last 72 hours. BMET No results  for input(s): "NA", "K", "CL", "CO2", "GLUCOSE", "BUN", "CREATININE", "CALCIUM" in the last 72 hours. LFT No results for input(s): "PROT", "ALBUMIN ", "AST", "ALT", "ALKPHOS", "BILITOT", "BILIDIR", "IBILI" in the last 72 hours. PT/INR No results for input(s): "LABPROT", "INR" in the last 72 hours.   Impression / Plan: This is a 58 y.o.female who presents for EGD/EUS for high risk pancreas cancer screening cohort (BRCA2 plus family history pancreas cancer).  The risks of an EUS including intestinal perforation, bleeding, infection, aspiration, and medication effects were discussed as was the possibility it may not give a definitive diagnosis if a biopsy is performed.  When a biopsy of the pancreas is done as part of the EUS, there is an additional risk of pancreatitis at the rate of about 1-2%.  It was explained that procedure related pancreatitis is typically mild, although it can be severe and even life threatening, which is why we do not perform random pancreatic biopsies and  only biopsy a lesion/area we feel is concerning enough to warrant the risk.   The risks and benefits of endoscopic evaluation/treatment were discussed with the patient and/or family; these include but are not limited to the risk of perforation, infection, bleeding, missed lesions, lack of diagnosis, severe illness requiring hospitalization, as well as anesthesia and sedation related illnesses.  The patient's history has been reviewed, patient examined, no change in status, and deemed stable for procedure.  The patient and/or family is agreeable to proceed.    Yong Henle, MD [facility] Office # [phone]

## 2023-05-30 NOTE — Discharge Instructions (Signed)

## 2023-05-30 NOTE — Transfer of Care (Signed)
 Immediate Anesthesia Transfer of Care Note  Patient: Julie Townsend  Procedure(s) Performed: UPPER ENDOSCOPIC ULTRASOUND (EUS) RADIAL EGD (ESOPHAGOGASTRODUODENOSCOPY) BIOPSY  Patient Location: PACU  Anesthesia Type:MAC  Level of Consciousness: awake and sedated  Airway & Oxygen Therapy: Patient Spontanous Breathing and Patient connected to face mask oxygen  Post-op Assessment: Report given to RN and Post -op Vital signs reviewed and stable  Post vital signs: Reviewed and stable  Last Vitals:  Vitals Value Taken Time  BP 117/82 05/30/23 1313  Temp    Pulse 83 05/30/23 1315  Resp 24 05/30/23 1315  SpO2 100 % 05/30/23 1315  Vitals shown include unfiled device data.  Last Pain:  Vitals:   05/30/23 1312  TempSrc: Temporal  PainSc: 0-No pain         Complications: No notable events documented.

## 2023-05-31 ENCOUNTER — Telehealth: Payer: Self-pay

## 2023-05-31 ENCOUNTER — Encounter: Payer: Self-pay | Admitting: Gastroenterology

## 2023-05-31 LAB — SURGICAL PATHOLOGY

## 2023-05-31 NOTE — Telephone Encounter (Signed)
 The pt states that the sore throat is much better. She will let us  know if she does not continue to improve.

## 2023-05-31 NOTE — Telephone Encounter (Signed)
-----   Message from Nurse Genevia Bouldin P sent at 05/30/2023  4:39 PM EDT ----- Regarding: FW: Follow-up  ----- Message ----- From: Normie Becton., MD Sent: 05/30/2023   4:37 PM EDT To: Aneita Keens, RN Subject: Follow-up                                      Carlota Philley, Can you follow-up with this patient on Tuesday and see how she is doing post EGD. She had some sore throat after her EUS and then as she was leaving she had some mild abdominal discomfort when she was entering her car which I heard about from my nursing team. Thanks. GM

## 2023-05-31 NOTE — Telephone Encounter (Signed)
 Thank you for this update Patty. Glad to hear. GM

## 2023-05-31 NOTE — Anesthesia Postprocedure Evaluation (Signed)
 Anesthesia Post Note  Patient: Julie Townsend  Procedure(s) Performed: UPPER ENDOSCOPIC ULTRASOUND (EUS) RADIAL EGD (ESOPHAGOGASTRODUODENOSCOPY) BIOPSY     Patient location during evaluation: PACU Anesthesia Type: MAC Level of consciousness: awake and alert Pain management: pain level controlled Vital Signs Assessment: post-procedure vital signs reviewed and stable Respiratory status: spontaneous breathing, nonlabored ventilation, respiratory function stable and patient connected to nasal cannula oxygen Cardiovascular status: stable and blood pressure returned to baseline Postop Assessment: no apparent nausea or vomiting Anesthetic complications: no   No notable events documented.  Last Vitals:  Vitals:   05/30/23 1330 05/30/23 1340  BP: 108/70 123/84  Pulse: 69 68  Resp: 16 12  Temp:    SpO2: 98% 100%    Last Pain:  Vitals:   05/30/23 1340  TempSrc:   PainSc: 3                  Melvenia Stabs

## 2023-06-01 ENCOUNTER — Other Ambulatory Visit: Payer: Self-pay

## 2023-06-01 ENCOUNTER — Encounter (HOSPITAL_COMMUNITY): Payer: Self-pay | Admitting: Gastroenterology

## 2023-06-01 MED ORDER — BISMUTH/METRONIDAZ/TETRACYCLIN 140-125-125 MG PO CAPS: 3.0000 | ORAL_CAPSULE | Freq: Four times a day (QID) | ORAL | 0 refills | Status: DC

## 2023-06-02 ENCOUNTER — Encounter (HOSPITAL_BASED_OUTPATIENT_CLINIC_OR_DEPARTMENT_OTHER)
Admission: RE | Disposition: A | Discharge status: Home / Self Care | Payer: Self-pay | Source: Home / Self Care | Attending: Plastic Surgery

## 2023-06-02 ENCOUNTER — Ambulatory Visit (HOSPITAL_BASED_OUTPATIENT_CLINIC_OR_DEPARTMENT_OTHER)
Admission: RE | Admit: 2023-06-02 | Discharge: 2023-06-02 | Disposition: A | Discharge status: Home / Self Care | Payer: Commercial Managed Care - HMO | Attending: Plastic Surgery | Admitting: Plastic Surgery

## 2023-06-02 ENCOUNTER — Telehealth: Payer: Self-pay | Admitting: Pharmacy Technician

## 2023-06-02 ENCOUNTER — Other Ambulatory Visit: Payer: Self-pay

## 2023-06-02 ENCOUNTER — Ambulatory Visit (HOSPITAL_BASED_OUTPATIENT_CLINIC_OR_DEPARTMENT_OTHER): Admitting: Anesthesiology

## 2023-06-02 ENCOUNTER — Encounter (HOSPITAL_BASED_OUTPATIENT_CLINIC_OR_DEPARTMENT_OTHER): Payer: Self-pay | Admitting: Plastic Surgery

## 2023-06-02 DIAGNOSIS — N651 Disproportion of reconstructed breast: Secondary | ICD-10-CM | POA: Diagnosis not present

## 2023-06-02 DIAGNOSIS — M1990 Unspecified osteoarthritis, unspecified site: Secondary | ICD-10-CM | POA: Diagnosis not present

## 2023-06-02 DIAGNOSIS — Z9221 Personal history of antineoplastic chemotherapy: Secondary | ICD-10-CM | POA: Insufficient documentation

## 2023-06-02 DIAGNOSIS — Z853 Personal history of malignant neoplasm of breast: Secondary | ICD-10-CM | POA: Diagnosis not present

## 2023-06-02 DIAGNOSIS — Z9013 Acquired absence of bilateral breasts and nipples: Secondary | ICD-10-CM | POA: Diagnosis not present

## 2023-06-02 DIAGNOSIS — N6489 Other specified disorders of breast: Secondary | ICD-10-CM | POA: Diagnosis present

## 2023-06-02 HISTORY — PX: REVISION, RECONSTRUCTION, BREAST: SHX7641

## 2023-06-02 HISTORY — PX: LIPOSUCTION WITH LIPOFILLING: SHX6436

## 2023-06-02 SURGERY — REVISION, RECONSTRUCTION, BREAST
Anesthesia: General | Site: Breast | Laterality: Right

## 2023-06-02 MED ORDER — CEFAZOLIN SODIUM-DEXTROSE 2-4 GM/100ML-% IV SOLN
2.0000 g | INTRAVENOUS | Status: AC
  Administered 2023-06-02: 2 g via INTRAVENOUS

## 2023-06-02 MED ORDER — CEFAZOLIN SODIUM-DEXTROSE 2-4 GM/100ML-% IV SOLN
INTRAVENOUS | Status: AC
Start: 2023-06-02 — End: ?
  Filled 2023-06-02: qty 100

## 2023-06-02 MED ORDER — AMISULPRIDE (ANTIEMETIC) 5 MG/2ML IV SOLN
INTRAVENOUS | Status: AC
  Filled 2023-06-02: qty 4

## 2023-06-02 MED ORDER — DIPHENHYDRAMINE HCL 50 MG/ML IJ SOLN
INTRAMUSCULAR | Status: AC
  Filled 2023-06-02: qty 1

## 2023-06-02 MED ORDER — LIDOCAINE HCL (PF) 1 % IJ SOLN
INTRAMUSCULAR | Status: AC
  Filled 2023-06-02: qty 180

## 2023-06-02 MED ORDER — FENTANYL CITRATE (PF) 100 MCG/2ML IJ SOLN
INTRAMUSCULAR | Status: AC
  Filled 2023-06-02: qty 2

## 2023-06-02 MED ORDER — CHLORHEXIDINE GLUCONATE CLOTH 2 % EX PADS: 6.0000 | MEDICATED_PAD | Freq: Once | CUTANEOUS | Status: DC

## 2023-06-02 MED ORDER — LACTATED RINGERS IV SOLN: INTRAVENOUS | Status: DC

## 2023-06-02 MED ORDER — FENTANYL CITRATE (PF) 100 MCG/2ML IJ SOLN
INTRAMUSCULAR | Status: AC
Start: 2023-06-02 — End: ?
  Filled 2023-06-02: qty 2

## 2023-06-02 MED ORDER — OXYCODONE HCL 5 MG PO TABS: 5.0000 mg | ORAL_TABLET | Freq: Once | ORAL | Status: DC | PRN

## 2023-06-02 MED ORDER — LIDOCAINE-EPINEPHRINE 1 %-1:100000 IJ SOLN
INTRAMUSCULAR | Status: AC
  Filled 2023-06-02: qty 3

## 2023-06-02 MED ORDER — OXYCODONE HCL 5 MG PO TABS: 5.0000 mg | ORAL_TABLET | ORAL | Status: DC | PRN

## 2023-06-02 MED ORDER — MIDAZOLAM HCL 5 MG/5ML IJ SOLN
INTRAMUSCULAR | Status: DC | PRN
  Administered 2023-06-02: 2 mg via INTRAVENOUS

## 2023-06-02 MED ORDER — ACETAMINOPHEN 500 MG PO TABS
ORAL_TABLET | ORAL | Status: AC
  Filled 2023-06-02: qty 2

## 2023-06-02 MED ORDER — AMISULPRIDE (ANTIEMETIC) 5 MG/2ML IV SOLN
10.0000 mg | Freq: Once | INTRAVENOUS | Status: AC | PRN
  Administered 2023-06-02: 10 mg via INTRAVENOUS

## 2023-06-02 MED ORDER — FENTANYL CITRATE (PF) 100 MCG/2ML IJ SOLN
INTRAMUSCULAR | Status: DC | PRN
  Administered 2023-06-02: 100 ug via INTRAVENOUS
  Administered 2023-06-02: 50 ug via INTRAVENOUS

## 2023-06-02 MED ORDER — LIDOCAINE HCL 1 % IJ SOLN
INTRAVENOUS | Status: DC | PRN
  Administered 2023-06-02: 450 mL

## 2023-06-02 MED ORDER — PHENYLEPHRINE HCL (PRESSORS) 10 MG/ML IV SOLN
INTRAVENOUS | Status: DC | PRN
  Administered 2023-06-02 (×2): 80 mg via INTRAVENOUS

## 2023-06-02 MED ORDER — EPINEPHRINE PF 1 MG/ML IJ SOLN
INTRAMUSCULAR | Status: AC
  Filled 2023-06-02: qty 3

## 2023-06-02 MED ORDER — ONDANSETRON HCL 4 MG/2ML IJ SOLN
INTRAMUSCULAR | Status: DC | PRN
  Administered 2023-06-02: 4 mg via INTRAVENOUS

## 2023-06-02 MED ORDER — BUPIVACAINE LIPOSOME 1.3 % IJ SUSP
INTRAMUSCULAR | Status: AC
  Filled 2023-06-02: qty 20

## 2023-06-02 MED ORDER — ACETAMINOPHEN 325 MG PO TABS: 650.0000 mg | ORAL_TABLET | ORAL | Status: DC | PRN

## 2023-06-02 MED ORDER — SODIUM CHLORIDE (PF) 0.9 % IJ SOLN
INTRAMUSCULAR | Status: AC
  Filled 2023-06-02: qty 60

## 2023-06-02 MED ORDER — TRANEXAMIC ACID-NACL 1000-0.7 MG/100ML-% IV SOLN
1000.0000 mg | INTRAVENOUS | Status: AC
  Administered 2023-06-02: 1000 mg via INTRAVENOUS

## 2023-06-02 MED ORDER — KETOROLAC TROMETHAMINE 30 MG/ML IJ SOLN: 30.0000 mg | Freq: Once | INTRAMUSCULAR | Status: DC | PRN

## 2023-06-02 MED ORDER — LACTATED RINGERS IV SOLN
INTRAVENOUS | Status: AC | PRN
  Administered 2023-06-02 (×2): 1000 mL

## 2023-06-02 MED ORDER — DIPHENHYDRAMINE HCL 50 MG/ML IJ SOLN
12.5000 mg | Freq: Once | INTRAMUSCULAR | Status: AC
  Administered 2023-06-02: 12.5 mg via INTRAVENOUS

## 2023-06-02 MED ORDER — SODIUM CHLORIDE 0.9% FLUSH: 3.0000 mL | INTRAVENOUS | Status: DC | PRN

## 2023-06-02 MED ORDER — SODIUM CHLORIDE 0.9% FLUSH: 3.0000 mL | Freq: Two times a day (BID) | INTRAVENOUS | Status: DC

## 2023-06-02 MED ORDER — FENTANYL CITRATE (PF) 100 MCG/2ML IJ SOLN: 25.0000 ug | INTRAMUSCULAR | Status: DC | PRN

## 2023-06-02 MED ORDER — PROPOFOL 10 MG/ML IV BOLUS
INTRAVENOUS | Status: DC | PRN
  Administered 2023-06-02: 200 mg via INTRAVENOUS

## 2023-06-02 MED ORDER — LIDOCAINE-EPINEPHRINE 1 %-1:100000 IJ SOLN
INTRAMUSCULAR | Status: DC | PRN
  Administered 2023-06-02: 19 mL

## 2023-06-02 MED ORDER — ACETAMINOPHEN 500 MG PO TABS
1000.0000 mg | ORAL_TABLET | Freq: Once | ORAL | Status: AC
  Administered 2023-06-02: 1000 mg via ORAL

## 2023-06-02 MED ORDER — DEXAMETHASONE SODIUM PHOSPHATE 4 MG/ML IJ SOLN
INTRAMUSCULAR | Status: DC | PRN
  Administered 2023-06-02: 5 mg via INTRAVENOUS

## 2023-06-02 MED ORDER — MIDAZOLAM HCL 2 MG/2ML IJ SOLN
INTRAMUSCULAR | Status: AC
Start: 2023-06-02 — End: ?
  Filled 2023-06-02: qty 2

## 2023-06-02 MED ORDER — SODIUM CHLORIDE 0.9 % IV SOLN: 250.0000 mL | INTRAVENOUS | Status: DC | PRN

## 2023-06-02 MED ORDER — FENTANYL CITRATE (PF) 100 MCG/2ML IJ SOLN
25.0000 ug | INTRAMUSCULAR | Status: DC | PRN
  Administered 2023-06-02: 50 ug via INTRAVENOUS
  Administered 2023-06-02: 25 ug via INTRAVENOUS

## 2023-06-02 MED ORDER — OXYCODONE HCL 5 MG/5ML PO SOLN: 5.0000 mg | Freq: Once | ORAL | Status: DC | PRN

## 2023-06-02 MED ORDER — EPHEDRINE SULFATE (PRESSORS) 50 MG/ML IJ SOLN
INTRAMUSCULAR | Status: DC | PRN
  Administered 2023-06-02: 10 mg via INTRAVENOUS

## 2023-06-02 MED ORDER — BUPIVACAINE HCL (PF) 0.25 % IJ SOLN
INTRAMUSCULAR | Status: AC
  Filled 2023-06-02: qty 90

## 2023-06-02 MED ORDER — LIDOCAINE HCL (CARDIAC) PF 100 MG/5ML IV SOSY
PREFILLED_SYRINGE | INTRAVENOUS | Status: DC | PRN
  Administered 2023-06-02: 60 mg via INTRAVENOUS

## 2023-06-02 MED ORDER — 0.9 % SODIUM CHLORIDE (POUR BTL) OPTIME
TOPICAL | Status: DC | PRN
  Administered 2023-06-02: 1000 mL

## 2023-06-02 MED ORDER — ACETAMINOPHEN 325 MG RE SUPP: 650.0000 mg | RECTAL | Status: DC | PRN

## 2023-06-02 MED ORDER — TRANEXAMIC ACID-NACL 1000-0.7 MG/100ML-% IV SOLN
INTRAVENOUS | Status: AC
  Filled 2023-06-02: qty 100

## 2023-06-02 SURGICAL SUPPLY — 50 items
BINDER ABDOMINAL 10 UNV 27-48 (MISCELLANEOUS) IMPLANT
BINDER ABDOMINAL 12 SM 30-45 (SOFTGOODS) IMPLANT
BINDER ABDOMINAL 9 SM 30-45 (SOFTGOODS) IMPLANT
BINDER BREAST LRG (GAUZE/BANDAGES/DRESSINGS) IMPLANT
BINDER BREAST MEDIUM (GAUZE/BANDAGES/DRESSINGS) IMPLANT
BINDER BREAST XLRG (GAUZE/BANDAGES/DRESSINGS) IMPLANT
BINDER BREAST XXLRG (GAUZE/BANDAGES/DRESSINGS) IMPLANT
BLADE HEX COATED 2.75 (ELECTRODE) IMPLANT
BLADE SURG 15 STRL LF DISP TIS (BLADE) ×2 IMPLANT
COVER BACK TABLE 60X90IN (DRAPES) ×2 IMPLANT
COVER MAYO STAND STRL (DRAPES) ×2 IMPLANT
DERMABOND ADVANCED .7 DNX12 (GAUZE/BANDAGES/DRESSINGS) ×2 IMPLANT
DRAPE LAPAROSCOPIC ABDOMINAL (DRAPES) ×2 IMPLANT
DRAPE UTILITY XL STRL (DRAPES) ×2 IMPLANT
DRESSING MEPILEX FLEX 4X4 (GAUZE/BANDAGES/DRESSINGS) IMPLANT
ELECTRODE REM PT RTRN 9FT ADLT (ELECTROSURGICAL) ×2 IMPLANT
EXTRACTOR CANIST REVOLVE STRL (CANNISTER) ×2 IMPLANT
GAUZE PAD ABD 8X10 STRL (GAUZE/BANDAGES/DRESSINGS) ×4 IMPLANT
GLOVE BIO SURGEON STRL SZ 6.5 (GLOVE) ×4 IMPLANT
GLOVE BIO SURGEON STRL SZ7.5 (GLOVE) IMPLANT
GLOVE BIOGEL PI IND STRL 7.0 (GLOVE) IMPLANT
GLOVE BIOGEL PI IND STRL 8 (GLOVE) IMPLANT
GOWN STRL REUS W/ TWL LRG LVL3 (GOWN DISPOSABLE) ×4 IMPLANT
GOWN STRL REUS W/ TWL XL LVL3 (GOWN DISPOSABLE) IMPLANT
IV LACTATED RINGERS 1000ML (IV SOLUTION) ×4 IMPLANT
LINER CANISTER 1000CC FLEX (MISCELLANEOUS) ×2 IMPLANT
NDL HYPO 25X1 1.5 SAFETY (NEEDLE) ×2 IMPLANT
NEEDLE HYPO 25X1 1.5 SAFETY (NEEDLE) ×2 IMPLANT
PACK BASIN DAY SURGERY FS (CUSTOM PROCEDURE TRAY) ×2 IMPLANT
PAD ALCOHOL SWAB (MISCELLANEOUS) ×2 IMPLANT
PAD FOAM SILICONE BACKED (GAUZE/BANDAGES/DRESSINGS) ×2 IMPLANT
PENCIL SMOKE EVACUATOR (MISCELLANEOUS) IMPLANT
SLEEVE SCD COMPRESS KNEE MED (STOCKING) ×2 IMPLANT
SOL PREP POV-IOD 4OZ 10% (MISCELLANEOUS) IMPLANT
SPIKE FLUID TRANSFER (MISCELLANEOUS) IMPLANT
SPONGE T-LAP 18X18 ~~LOC~~+RFID (SPONGE) ×2 IMPLANT
STRIP SUTURE WOUND CLOSURE 1/2 (MISCELLANEOUS) ×2 IMPLANT
SUT MNCRL AB 3-0 PS2 27 (SUTURE) IMPLANT
SUT MNCRL AB 4-0 PS2 18 (SUTURE) IMPLANT
SUT MON AB 5-0 PS2 18 (SUTURE) ×4 IMPLANT
SYR 10ML LL (SYRINGE) ×10 IMPLANT
SYR 3ML 18GX1 1/2 (SYRINGE) IMPLANT
SYR 50ML LL SCALE MARK (SYRINGE) ×2 IMPLANT
SYR CONTROL 10ML LL (SYRINGE) ×2 IMPLANT
SYR TOOMEY 50ML (SYRINGE) IMPLANT
TOWEL GREEN STERILE FF (TOWEL DISPOSABLE) ×4 IMPLANT
TRAY DSU PREP LF (CUSTOM PROCEDURE TRAY) ×2 IMPLANT
TUBING INFILTRATION IT-10001 (TUBING) ×2 IMPLANT
TUBING SET GRADUATE ASPIR 12FT (MISCELLANEOUS) ×2 IMPLANT
UNDERPAD 30X36 HEAVY ABSORB (UNDERPADS AND DIAPERS) ×4 IMPLANT

## 2023-06-02 NOTE — Op Note (Signed)
 DATE OF OPERATION: 06/02/2023  LOCATION: [facility]  PREOPERATIVE DIAGNOSIS: Breast asymmetry after reconstruction for breast cancer  POSTOPERATIVE DIAGNOSIS: Same  PROCEDURE: Fat grafting to bilateral breasts Excision of excess breast tissue right breast 2 x 5 cm skin and soft tissue Release of scar/capsule contracture left breast 100 cm2 Release of scar/capsule contracture right breast 150 cm2  SURGEON: Gilles Lacks, DO  ASSISTANT: Mariel Shope, PA  EBL: none  CONDITION: Stable  COMPLICATIONS: None  INDICATION: The patient, Julie Townsend, is a 58 y.o. female born on [birth year], is here for treatment of breast asymmetry after breast reconstruction for cancer.   PROCEDURE DETAILS:  The patient was seen prior to surgery and marked.  The IV antibiotics were given. The patient was taken to the operating room and given a general anesthetic. A standard time out was performed and all information was confirmed by those in the room. SCDs were placed.   The chest and abdomen were prepped and draped.  Local with epinephrine  was placed in the abdomen at two sites 1 cm in size.  The #15 blade was used to make a small opening and the tumescent was infused.  Liposuction was then done and collected in the [facility].  The adipose was prepared according to the manufacture guidelines.  Local was then placed in the breast area at the medial and lateral area of the right breast and at the medial area of the left breast.  The #15 blade was used to make a small incision.  The forked knife was introduced to breast us  the scar contracture of the medial aspect of the left breast for an area of 100 cm2 and the right breast medially and inferiorly for an area of 150 cm2.  The prepared adipose was then placed in the medial left breast and 60 cc was placed.  The right breast had 100 cc of adipose infused in the medial, superior and inferior aspect.  Through a small incision laterally liposuction was  done to reduce the bulk.  The #15 blade was then used to excise and area of 2 x 5 cm of skin and soft tissue laterally. This was then closed with the 3-0 Monocryl and the 4-0 Monocryl. All incisions were closed with the 5- or 5-0 Monocryl.  Sterile dressings were applied and the binders and lipofoam. The patient was allowed to wake up and taken to recovery room in stable condition at the end of the case. The family was notified at the end of the case.   The advanced practice practitioner (APP) assisted throughout the case.  The APP was essential in retraction and counter traction when needed to make the case progress smoothly.  This retraction and assistance made it possible to see the tissue plans for the procedure.  The assistance was needed for blood control, tissue re-approximation and assisted with closure of the incision site.

## 2023-06-02 NOTE — Anesthesia Postprocedure Evaluation (Signed)
 Anesthesia Post Note  Patient: Julie Townsend  Procedure(s) Performed: Liposuction right breast with excess tissue removal and bilateral breast fat grafting (Right: Breast) bilateral breast fat grafting (Bilateral: Breast)     Patient location during evaluation: PACU Anesthesia Type: General Level of consciousness: awake Pain management: pain level controlled Vital Signs Assessment: post-procedure vital signs reviewed and stable Respiratory status: spontaneous breathing, nonlabored ventilation and respiratory function stable Cardiovascular status: blood pressure returned to baseline and stable Postop Assessment: no apparent nausea or vomiting Anesthetic complications: no   No notable events documented.  Last Vitals:  Vitals:   06/02/23 1050 06/02/23 1230  BP:  129/84  Pulse: 80 78  Resp: 15 16  Temp:  (!) 36.3 C  SpO2: 98% 98%    Last Pain:  Vitals:   06/02/23 1230  TempSrc:   PainSc: 3                  Shemekia Patane P Jarred Purtee

## 2023-06-02 NOTE — Transfer of Care (Signed)
 Immediate Anesthesia Transfer of Care Note  Patient: Julie Townsend  Procedure(s) Performed: Liposuction right breast with excess tissue removal and bilateral breast fat grafting (Right: Breast) bilateral breast fat grafting (Bilateral: Breast)  Patient Location: PACU  Anesthesia Type:General  Level of Consciousness: awake, alert , oriented, drowsy, and patient cooperative  Airway & Oxygen Therapy: Patient Spontanous Breathing and Patient connected to face mask oxygen  Post-op Assessment: Report given to RN and Post -op Vital signs reviewed and stable  Post vital signs: Reviewed and stable  Last Vitals:  Vitals Value Taken Time  BP    Temp    Pulse 81 06/02/23 0918  Resp 10 06/02/23 0918  SpO2 100 % 06/02/23 0918  Vitals shown include unfiled device data.  Last Pain:  Vitals:   06/02/23 0647  TempSrc: Temporal  PainSc: 0-No pain         Complications: No notable events documented.

## 2023-06-02 NOTE — Discharge Instructions (Addendum)
INSTRUCTIONS FOR AFTER BREAST SURGERY   You will likely have some questions about what to expect following your operation.  The following information will help you and your family understand what to expect when you are discharged from the hospital.  It is important to follow these guidelines to help ensure a smooth recovery and reduce complication.  Postoperative instructions include information on: diet, wound care, medications and physical activity.  AFTER SURGERY Expect to go home after the procedure.  In some cases, you may need to spend one night in the hospital for observation.  DIET Breast surgery does not require a specific diet.  However, the healthier you eat the better your body will heal. It is important to increasing your protein intake.  This means limiting the foods with sugar and carbohydrates.  Focus on vegetables and some meat.  If you have liposuction during your procedure be sure to drink water.  If your urine is bright yellow, then it is concentrated, and you need to drink more water.  As a general rule after surgery, you should have 8 ounces of water every hour while awake.  If you find you are persistently nauseated or unable to take in liquids let us know.  NO TOBACCO USE or EXPOSURE.  This will slow your healing process and lead to a wound.  WOUND CARE Leave the binder on for 3 days . Use fragrance free soap like Dial, Dove or [place].   After 3 days you can remove the binder to shower. Once dry apply binder or sports bra. If you have liposuction you will have a soft and spongy dressing (Lipofoam) that helps prevent creases in your skin.  Remove before you shower and then replace it.  It is also available on [employer]. If you have steri-strips / tape directly attached to your skin leave them in place. It is OK to get these wet.   No baths, pools or hot tubs for four weeks. We close your incision to leave the smallest and best-looking scar. No ointment or creams on your incisions  for four weeks.  No Neosporin (Too many skin reactions).  A few weeks after surgery you can use Mederma and start massaging the scar. We ask you to wear your binder or sports bra for the first 6 weeks around the clock, including while sleeping. This provides added comfort and helps reduce the fluid accumulation at the surgery site. NO Ice or heating pads to the operative site.  You have a very high risk of a BURN before you feel the temperature change.  ACTIVITY No heavy lifting until cleared by the doctor.  This usually means no more than a half-gallon of milk.  It is OK to walk and climb stairs. Moving your legs is very important to decrease your risk of a blood clot.  It will also help keep you from getting deconditioned.  Every 1 to 2 hours get up and walk for 5 minutes. This will help with a quicker recovery back to normal.  Let pain be your guide so you don't do too much.  This time is for you to recover.  You will be more comfortable if you sleep and rest with your head elevated either with a few pillows under you or in a recliner.  No stomach sleeping for a three months.  WORK Everyone returns to work at different times. As a rough guide, most people take at least 1 - 2 weeks off prior to returning to work. If  you need documentation for your job, give the forms to the front staff at the clinic.  DRIVING Arrange for someone to bring you home from the hospital after your surgery.  You may be able to drive a few days after surgery but not while taking any narcotics or valium.  BOWEL MOVEMENTS Constipation can occur after anesthesia and while taking pain medication.  It is important to stay ahead for your comfort.  We recommend taking Milk of Magnesia (2 tablespoons; twice a day) while taking the pain pills.  MEDICATIONS You may be prescribed should start after surgery At your preoperative visit for you history and physical you may have been given the following medications: An antibiotic: Start  this medication when you get home and take according to the instructions on the bottle. Zofran 4 mg:  This is to treat nausea and vomiting.  You can take this every 6 hours as needed and only if needed. Valium 2 mg for breast cancer patients: This is for muscle tightness if you have an implant or expander. This will help relax your muscle which also helps with pain control.  This can be taken every 12 hours as needed. Don't drive after taking this medication. Norco (hydrocodone/acetaminophen) 5/325 mg:  This is only to be used after you have taken the Motrin or the Tylenol. Every 8 hours as needed.   Over the counter Medication to take: Ibuprofen (Motrin) 600 mg:  Take this every 6 hours.  If you have additional pain then take 500 mg of the Tylenol every 8 hours.  Only take the Norco after you have tried these two. MiraLAX or Milk of Magnesia: Take this according to the bottle if you take the [place] Call your surgeon's office if any of the following occur: Fever 101 degrees F or greater Excessive bleeding or fluid from the incision site. Pain that increases over time without aid from the medications Redness, warmth, or pus draining from incision sites Persistent nausea or inability to take in liquids Severe misshapen area that underwent the operation.  Post Anesthesia Home Care Instructions  Activity: Get plenty of rest for the remainder of the day. A responsible individual must stay with you for 24 hours following the procedure.  For the next 24 hours, DO NOT: -Drive a car -Paediatric nurse -Drink alcoholic beverages -Take any medication unless instructed by your physician -Make any legal decisions or sign important papers.  Meals: Start with liquid foods such as gelatin or soup. Progress to regular foods as tolerated. Avoid greasy, spicy, heavy foods. If nausea and/or vomiting occur, drink only clear liquids until the nausea and/or vomiting subsides. Call your  physician if vomiting continues.  Special Instructions/Symptoms: Your throat may feel dry or sore from the anesthesia or the breathing tube placed in your throat during surgery. If this causes discomfort, gargle with warm salt water. The discomfort should disappear within 24 hours.  If you had a scopolamine patch placed behind your ear for the management of post- operative nausea and/or vomiting:  1. The medication in the patch is effective for 72 hours, after which it should be removed.  Wrap patch in a tissue and discard in the trash. Wash hands thoroughly with soap and water. 2. You may remove the patch earlier than 72 hours if you experience unpleasant side effects which may include dry mouth, dizziness or visual disturbances. 3. Avoid touching the patch. Wash your hands with soap and water after contact with the patch.

## 2023-06-02 NOTE — Telephone Encounter (Signed)
 Pharmacy Patient Advocate Encounter   Received notification from CoverMyMeds that prior authorization for EMGALITY  120MG  is required/requested.   Insurance verification completed.   The patient is insured through [employer] .   Per test claim: PA required; PA submitted to above mentioned insurance via CoverMyMeds Key/confirmation #/EOC [id number] Status is pending

## 2023-06-02 NOTE — Interval H&P Note (Signed)
 History and Physical Interval Note:  06/02/2023 6:59 AM  Julie Townsend  has presented today for surgery, with the diagnosis of history of breast cancer.  The various methods of treatment have been discussed with the patient and family. After consideration of risks, benefits and other options for treatment, the patient has consented to  Procedure(s): Liposuction right breast with excess tissue removal and bilateral breast fat grafting (Right) bilateral breast fat grafting (Bilateral) as a surgical intervention.  The patient's history has been reviewed, patient examined, no change in status, stable for surgery.  I have reviewed the patient's chart and labs.  Questions were answered to the patient's satisfaction.     Lindaann Requena Meeka Cartelli

## 2023-06-02 NOTE — Anesthesia Procedure Notes (Signed)
 Procedure Name: LMA Insertion Date/Time: 06/02/2023 7:38 AM  Performed by: Eugenia Hess, CRNAPre-anesthesia Checklist: Patient identified, Emergency Drugs available, Suction available and Patient being monitored Patient Re-evaluated:Patient Re-evaluated prior to induction Oxygen Delivery Method: Circle system utilized Preoxygenation: Pre-oxygenation with 100% oxygen Induction Type: IV induction Ventilation: Mask ventilation without difficulty LMA: LMA inserted LMA Size: 4.0 Number of attempts: 1 Airway Equipment and Method: Bite block Placement Confirmation: positive ETCO2 Tube secured with: Tape Dental Injury: Teeth and Oropharynx as per pre-operative assessment

## 2023-06-02 NOTE — Anesthesia Preprocedure Evaluation (Addendum)
 Anesthesia Evaluation  Patient identified by MRN, date of birth, ID band Patient awake    Reviewed: Allergy & Precautions, NPO status , Patient's Chart, lab work & pertinent test results  Airway Mallampati: II  TM Distance: >3 FB Neck ROM: Full    Dental no notable dental hx.    Pulmonary neg pulmonary ROS   Pulmonary exam normal        Cardiovascular negative cardio ROS Normal cardiovascular exam     Neuro/Psych  Headaches  negative psych ROS   GI/Hepatic negative GI ROS, Neg liver ROS,,,  Endo/Other  negative endocrine ROS    Renal/GU negative Renal ROS     Musculoskeletal  (+) Arthritis ,    Abdominal   Peds  Hematology negative hematology ROS (+)   Anesthesia Other Findings history of breast cancer  Reproductive/Obstetrics                             Anesthesia Physical Anesthesia Plan  ASA: 2  Anesthesia Plan: General   Post-op Pain Management:    Induction: Intravenous  PONV Risk Score and Plan: 3 and Ondansetron , Dexamethasone , Midazolam  and Treatment may vary due to age or medical condition  Airway Management Planned: LMA  Additional Equipment:   Intra-op Plan:   Post-operative Plan: Extubation in OR  Informed Consent: I have reviewed the patients History and Physical, chart, labs and discussed the procedure including the risks, benefits and alternatives for the proposed anesthesia with the patient or authorized representative who has indicated his/her understanding and acceptance.     Dental advisory given  Plan Discussed with: CRNA  Anesthesia Plan Comments:         Anesthesia Quick Evaluation

## 2023-06-03 ENCOUNTER — Telehealth: Payer: Self-pay | Admitting: Student

## 2023-06-03 ENCOUNTER — Encounter (HOSPITAL_BASED_OUTPATIENT_CLINIC_OR_DEPARTMENT_OTHER): Payer: Self-pay | Admitting: Plastic Surgery

## 2023-06-03 NOTE — Telephone Encounter (Signed)
 I called the patient today to see how she is doing after surgery.  She reports she is overall doing well.  She states that her pain has been controlled.  She states she took 1 oxycodone  last night and reports she took naproxen today.  She states that she was a little nauseous last night, but took a Zofran  and felt better.  She states that the nausea has been improving.  She states that she has been eating and drinking.  She reports that she has been voiding without issue.  She states that she has been passing gas, but has not yet had a bowel movement.  She reports that she has been icing the abdomen.  She denies any worsening swelling or pain.  Recommended that patient continue compression at all times.  Discussed with her that if she has any worsening swelling or pain or has any concerning symptoms to let us  know.  Patient expressed understanding.

## 2023-06-06 ENCOUNTER — Other Ambulatory Visit (HOSPITAL_COMMUNITY): Payer: Self-pay

## 2023-06-06 ENCOUNTER — Telehealth: Payer: Self-pay

## 2023-06-06 NOTE — Telephone Encounter (Signed)
 Pharmacy Patient Advocate Encounter   Received notification from Pt Calls Messages that prior authorization for Pylera 140-125-125MG  capsules is required/requested.   Insurance verification completed.   The patient is insured through [employer] .  KEY [id number]   Per test claim: Prior Authorization form/request asks a question that requires your assistance. Please see the question below and advise accordingly. The PA will not be submitted until the necessary information is received.

## 2023-06-06 NOTE — Telephone Encounter (Signed)
 PA request has been Started. New Encounter has been or will be created for follow up. For additional info see Pharmacy Prior Auth telephone encounter from 06-06-2023.

## 2023-06-06 NOTE — Telephone Encounter (Signed)
 Patient states Pylera needs PA. Please advise.   Thank you

## 2023-06-07 NOTE — Telephone Encounter (Signed)
 Not sure why there is an issue with getting this approved. Since having issues, then would go ahead and just send in the individual ingredients for the outlined time period. Thanks. GM

## 2023-06-08 ENCOUNTER — Other Ambulatory Visit: Payer: Self-pay

## 2023-06-08 MED ORDER — CLARITHROMYCIN 500 MG PO TABS: 500.0000 mg | ORAL_TABLET | Freq: Two times a day (BID) | ORAL | 0 refills | Status: DC

## 2023-06-08 MED ORDER — OMEPRAZOLE 20 MG PO CPDR: 20.0000 mg | DELAYED_RELEASE_CAPSULE | Freq: Two times a day (BID) | ORAL | 0 refills | Status: DC

## 2023-06-08 MED ORDER — METRONIDAZOLE 500 MG PO TABS: 500.0000 mg | ORAL_TABLET | Freq: Two times a day (BID) | ORAL | 0 refills | Status: AC

## 2023-06-08 MED ORDER — AMOXICILLIN 500 MG PO TABS: 1000.0000 mg | ORAL_TABLET | Freq: Two times a day (BID) | ORAL | 0 refills | Status: DC

## 2023-06-08 NOTE — Telephone Encounter (Signed)
 Individual components have been sent to the pharmacy as directed.

## 2023-06-08 NOTE — Telephone Encounter (Signed)
 Julie Townsend

## 2023-06-08 NOTE — Telephone Encounter (Signed)
 The pt has been advised of the new prescriptions.

## 2023-06-10 ENCOUNTER — Encounter: Payer: Self-pay | Admitting: Plastic Surgery

## 2023-06-10 ENCOUNTER — Other Ambulatory Visit (HOSPITAL_COMMUNITY): Payer: Self-pay

## 2023-06-10 ENCOUNTER — Ambulatory Visit (INDEPENDENT_AMBULATORY_CARE_PROVIDER_SITE_OTHER): Payer: Commercial Managed Care - HMO | Admitting: Plastic Surgery

## 2023-06-10 DIAGNOSIS — Z9011 Acquired absence of right breast and nipple: Secondary | ICD-10-CM

## 2023-06-10 NOTE — Progress Notes (Signed)
 The patient is a 58 year old female here for follow-up after undergoing fat grafting to her breast.  Her breast look really good.  She is really pleased with the improvement in her shape.  She does have the bruising still on her abdomen.  Has not gotten any worse.  It is quite tender.  She is on a regimen for H. pylori.  When she finishes that she can either do the Arnica pills or she could start doing the Arnica cream which may help with the bruising.  Continue with the binders and we will plan to see her back in the next 1 to 2 weeks.  Will get pictures at that time.

## 2023-06-10 NOTE — Telephone Encounter (Signed)
 Pharmacy Patient Advocate Encounter  Received notification from [facility] that Prior Authorization for EMGALITY  120MG  has been APPROVED from 5.1.25 to 5.1.26. Unable to obtain price due to refill too soon rejection, last fill date 4.22.25 next available fill date5.20.25   PA #/Case ID/Reference #: [id number]

## 2023-06-16 ENCOUNTER — Telehealth: Payer: Self-pay | Admitting: Physician Assistant

## 2023-06-16 NOTE — Telephone Encounter (Signed)
 Called and left vmail, Julie Townsend out of the office at 1pm on tues 20th and held a spot for her for Wed 21st same time if she wants it. Waiting on call back.

## 2023-06-20 NOTE — Progress Notes (Signed)
 Patient is a pleasant 58 year old female s/p excision of excess breast tissue right breast with release of scar/capsular contracture and abdominal liposuction with fat grafting to bilateral breasts performed 06/02/2023 by Dr. Orin Birk who presents to clinic for postoperative follow-up.  She was last seen here in clinic on 06/10/2023.  At that time, she was pleased with the improvement in her breast shape.  Surgery was complicated by postoperative hematoma in the abdomen and at that time her ecchymoses was stable, but she was tender on exam.  Recommended Arnica.  Today, she is accompanied by her husband at bedside.  She states that she is doing much better since last encounter.  The Arnica has helped tremendously with her bruising.  She is also significantly pleased with the improved shape and contour of right reconstructed breast.  She feels as though the hematoma and left side of abdomen has gotten a bit smaller, but still palpable.  On exam, abdomen is soft and without any considerable TTP.  Mild TTP over left side of abdomen over site of her resolving hematoma with old overlying ecchymoses.  Umbilicus is viable.  Remainder of abdomen without any obvious seroma or hematoma.  Right reconstructed breast is looking improved.  Steri-Strip remains firmly intact over the site of scar revision.    Recommending gentle mechanical massage of the area of old hematoma to help it soften.  Continue with compressive abdominal garment for another 10 to 14 days.  She may begin to slowly increase activity, but still will advise no heavy lifting beyond 15 to 20 pounds until we can visualize her right reconstructed breast incision to ensure that is healing appropriately and without any incisional wound.  Follow-up in 3 weeks for likely final postoperative encounter.  At that time, can schedule her for the dogear revision which has already been discussed with Dr. Orin Birk.  Photos at that visit.

## 2023-06-21 ENCOUNTER — Encounter: Payer: Commercial Managed Care - HMO | Admitting: Physician Assistant

## 2023-06-22 ENCOUNTER — Ambulatory Visit (INDEPENDENT_AMBULATORY_CARE_PROVIDER_SITE_OTHER): Admitting: Physician Assistant

## 2023-06-22 VITALS — BP 131/87 | HR 74

## 2023-06-22 DIAGNOSIS — Z9889 Other specified postprocedural states: Secondary | ICD-10-CM

## 2023-06-22 DIAGNOSIS — Z171 Estrogen receptor negative status [ER-]: Secondary | ICD-10-CM

## 2023-06-22 DIAGNOSIS — C50512 Malignant neoplasm of lower-outer quadrant of left female breast: Secondary | ICD-10-CM

## 2023-06-29 ENCOUNTER — Telehealth: Payer: Self-pay | Admitting: Pharmacist

## 2023-06-29 NOTE — Telephone Encounter (Signed)
 Pharmacy Patient Advocate Encounter  Received notification from [facility] that Prior Authorization for Ubrelvy  100MG  tablets has been APPROVED from 06/29/2023 to 06/28/2024   PA #/Case ID/Reference #: [id number]

## 2023-07-12 NOTE — Progress Notes (Signed)
 Patient is a pleasant 58 year old female s/p excision of excess breast tissue right breast with release of scar/capsular contracture and abdominal liposuction with fat grafting to bilateral breasts performed 06/02/2023 by Dr. Lowery who presents to clinic for postoperative follow-up.   She was last seen here in clinic on 06/22/2023.  At that time, exam was largely benign.  Mild TTP over left side of abdomen over site of her resolving hematoma with old overlying ecchymoses.  Recommended gentle mechanical massage of the area to help it soften and continue with compressive abdominal binder for another 10 to 14 days.  Follow-up in 3 weeks for likely final postoperative encounter.  Today, patient is doing well.  She states that the area of firmness in her abdomen has continued to soften improve since last encounter.  She does feel as though her right reconstructed breast is a bit more coney, similar to how it was preoperatively.  She thinks that the initial improvement may have been partially attributable to swelling and inflammation.  However, she does note that it is slightly improved from before.  She is not sure she will want to move forward with any additional fat grafting or surgical intervention for contouring in the future.  On examination, abdomen is soft and nondistended.  Resolving hematoma, no longer quite as discrete.  Right breast without any wound healing issues or overlying skin changes.  No palpable underlying fluid collections.  Appears well-healed from surgical standpoint.  At this time, reasonable to move forward with the revision of described dogear at the inferior aspect of latissimus dorsi harvest site which patient has already spoken about with Dr. Lowery.  This is performed here in clinic, approximately 30-minute procedure.    Patient can increase activity as tolerated and no longer requires compressive garments.  Follow-up in 8 weeks as scheduled.  Call the office should she  have questions or concerns in the interim.

## 2023-07-13 ENCOUNTER — Encounter: Payer: Self-pay | Admitting: Physician Assistant

## 2023-07-13 ENCOUNTER — Ambulatory Visit: Admitting: Physician Assistant

## 2023-07-13 VITALS — BP 126/76 | HR 77 | Wt 114.0 lb

## 2023-07-13 DIAGNOSIS — C50512 Malignant neoplasm of lower-outer quadrant of left female breast: Secondary | ICD-10-CM

## 2023-07-13 DIAGNOSIS — Z171 Estrogen receptor negative status [ER-]: Secondary | ICD-10-CM

## 2023-08-01 ENCOUNTER — Telehealth: Payer: Self-pay

## 2023-08-01 DIAGNOSIS — A048 Other specified bacterial intestinal infections: Secondary | ICD-10-CM

## 2023-08-01 NOTE — Telephone Encounter (Signed)
-----   Message from Nurse Patty P sent at 06/01/2023 10:27 AM EDT ----- H. pylori [facility] testing under Dr. San name

## 2023-08-01 NOTE — Telephone Encounter (Signed)
 Called and spoke with patient to remind her that she is due for H. Pylori stool test at this time. Patient knows to pick up Diatherix stool kit from 2nd floor front desk, she will return specimen directly to lab. Patient asked that I send this information to her via MyChart as well. Otherwise, patient verbalized understanding and had no concerns at the end of the call.   [facility] H. Pylori kit placed at 2nd floor front desk for pick up.

## 2023-08-04 IMAGING — MG MM DIGITAL SCREENING BILAT W/ TOMO AND CAD
8 series · 9 of 24 positions shown · non-contrast
Comparison: Previous exam(s).

CLINICAL DATA: Screening.

EXAM:
DIGITAL SCREENING BILATERAL MAMMOGRAM WITH TOMOSYNTHESIS AND CAD
TECHNIQUE: Bilateral screening digital craniocaudal and mediolateral oblique
mammograms were obtained. Bilateral screening digital breast
tomosynthesis was performed. The images were evaluated with
computer-aided detection.

[L CC synth-2D]
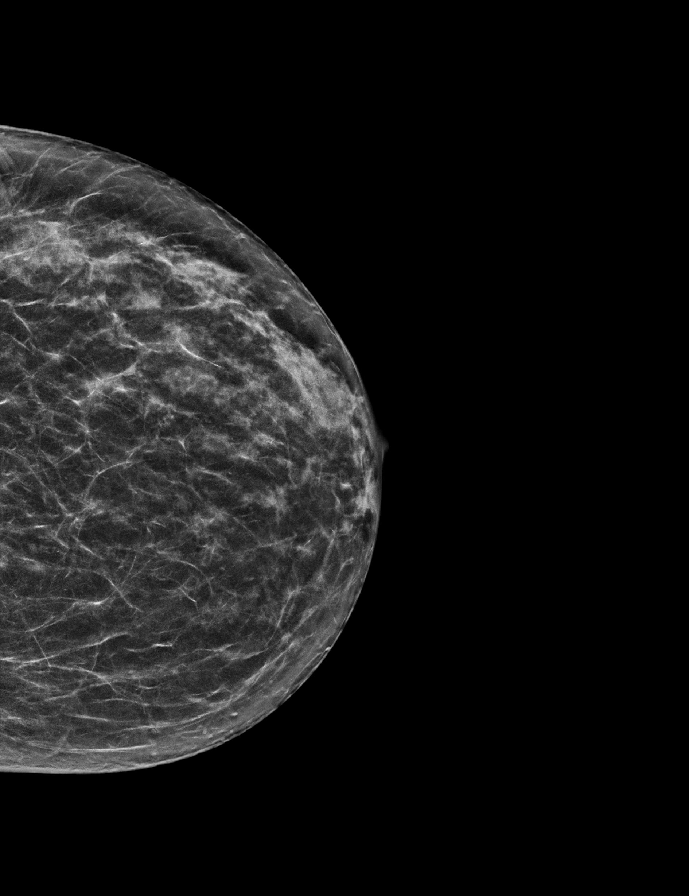

[R CC synth-2D]
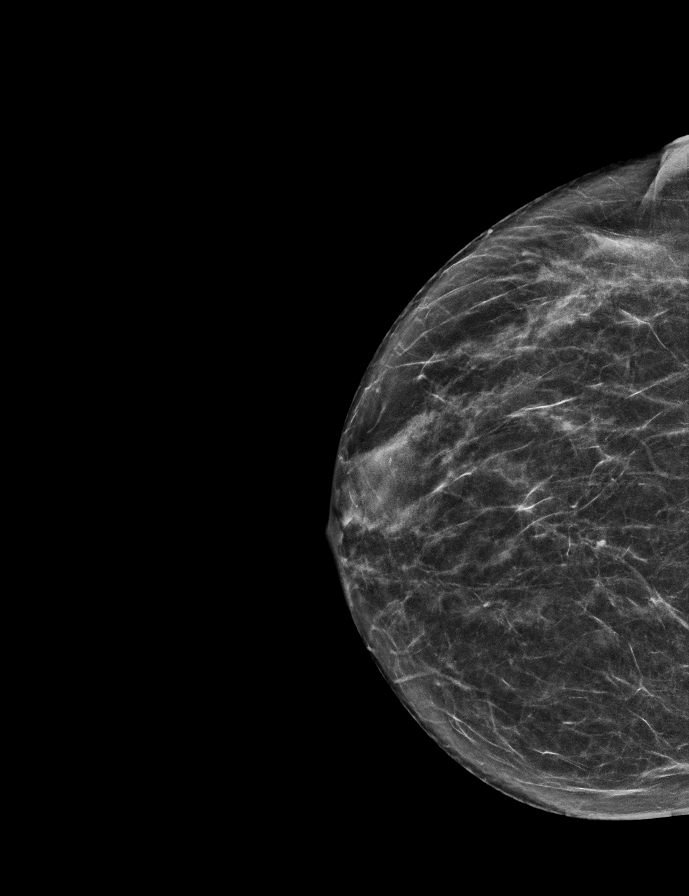

[R MLO synth-2D]
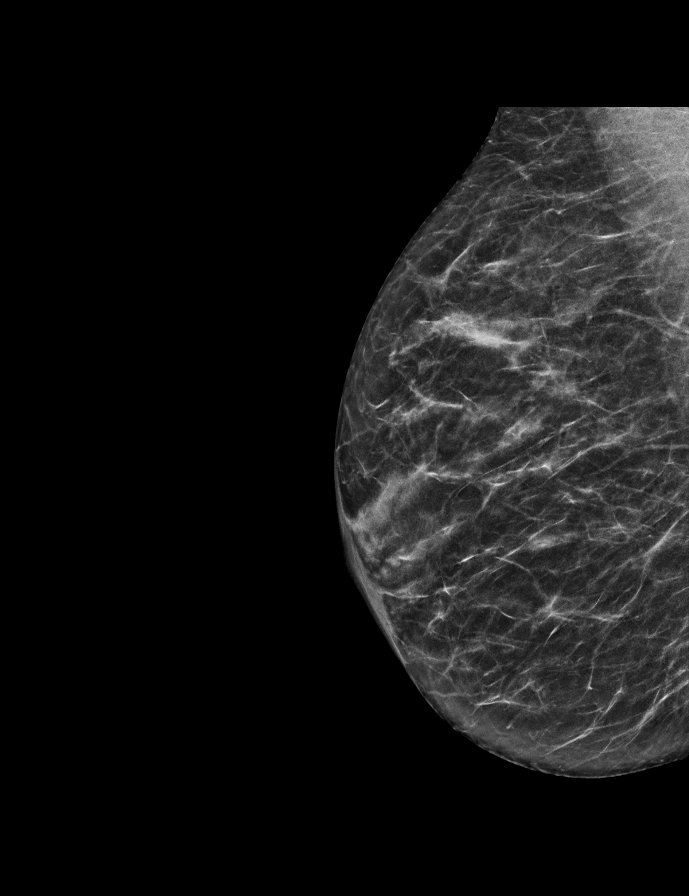

[L MLO synth-2D]
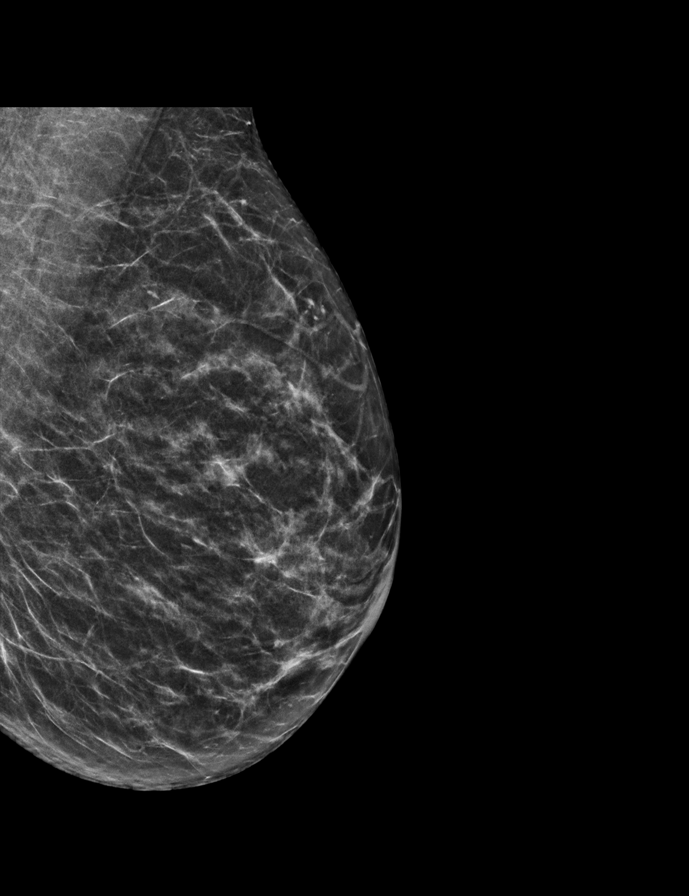

[L MLO tomo · 2 of 52 frames shown]
[frame 17/52]
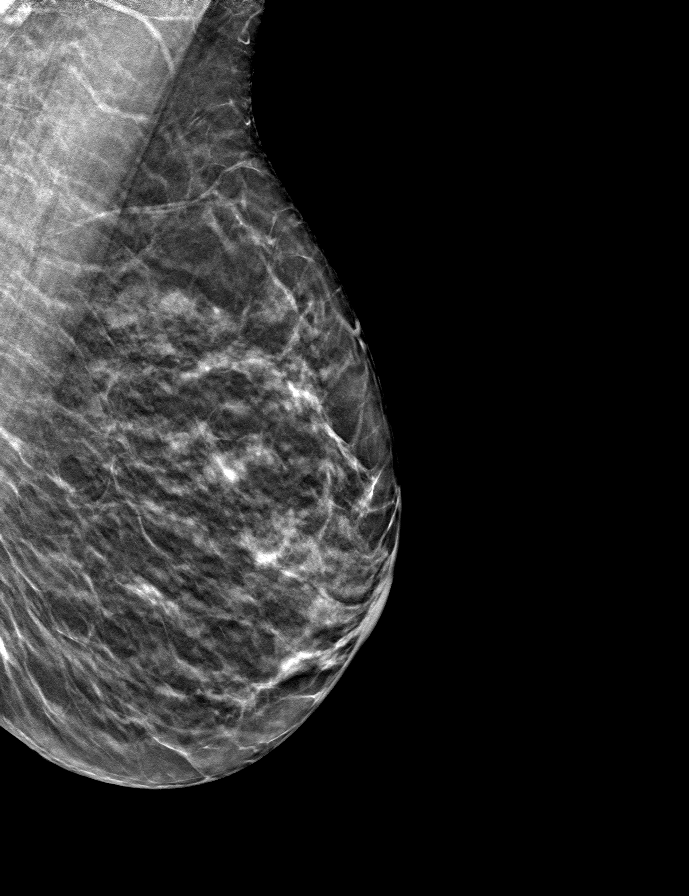
[frame 27/52]
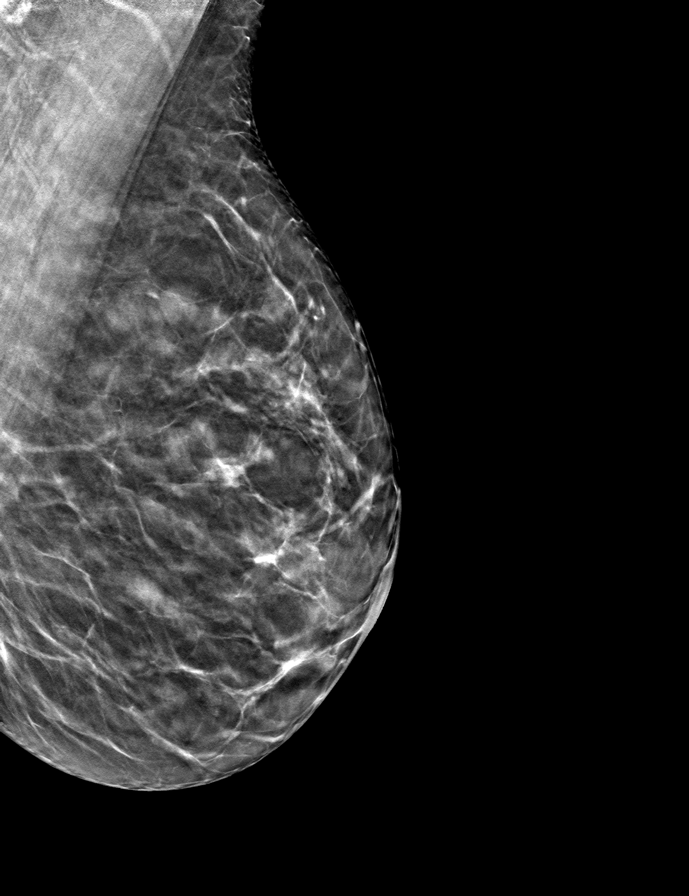

[L CC tomo · tomo slice 25/50.0]
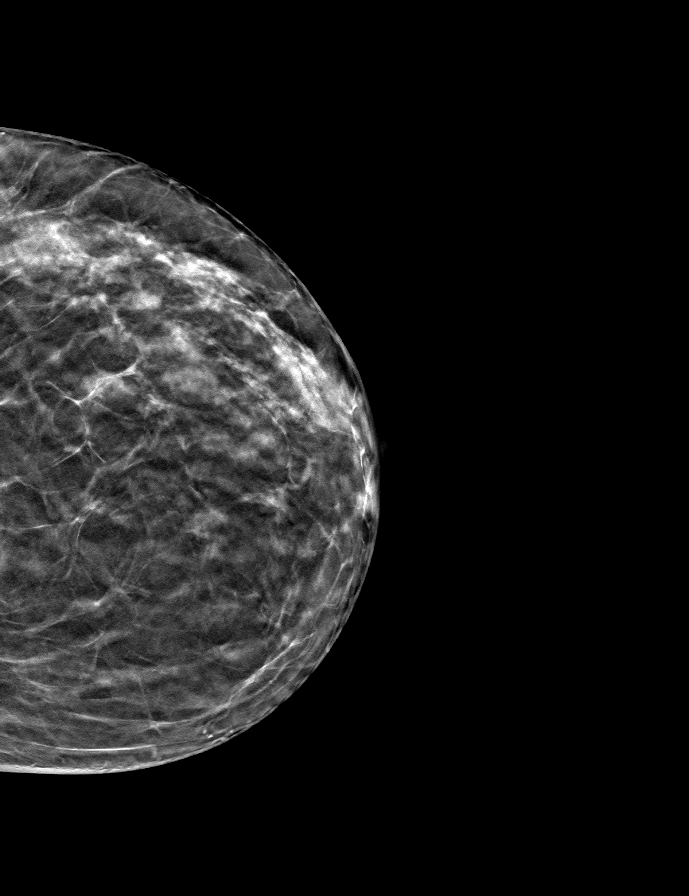

[R CC tomo · tomo slice 27/52.0]
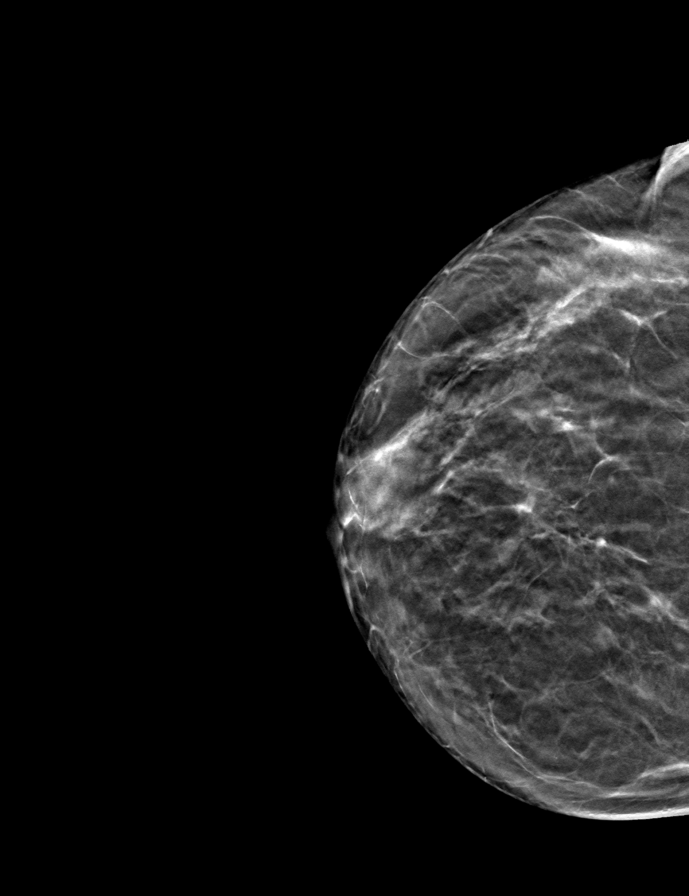

[R MLO tomo · tomo slice 25/50.0]
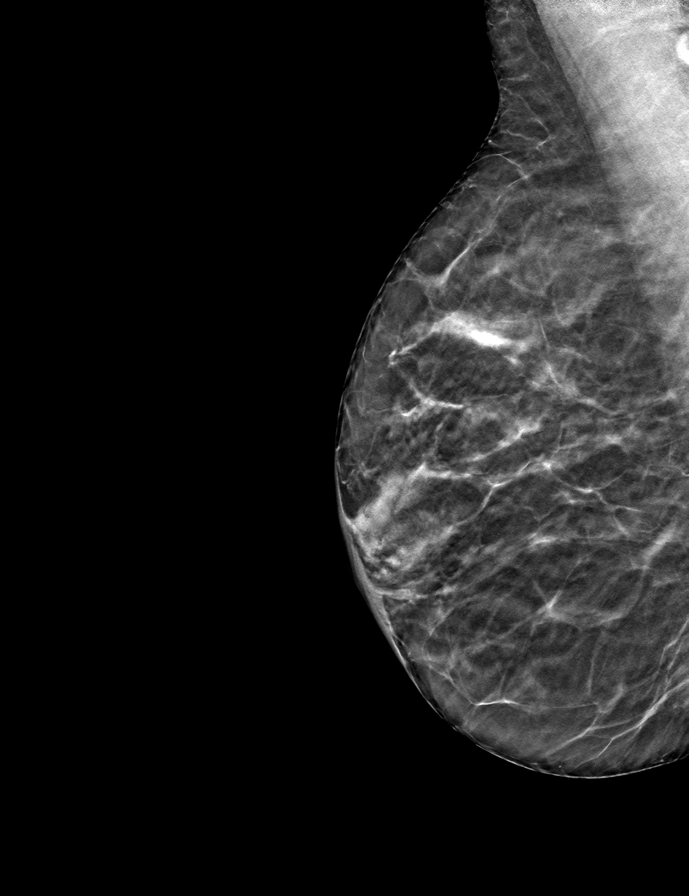

[9 of 24 positions shown; findings below may reference images not displayed]

ACR Breast Density Category b: There are scattered areas of
fibroglandular density.
FINDINGS: There are no findings suspicious for malignancy.
IMPRESSION: No mammographic evidence of malignancy. A result letter of this
screening mammogram will be mailed directly to the patient.

RECOMMENDATION:
Screening mammogram in one year. (Code:51-O-LD2)

BI-RADS CATEGORY  1: Negative.

## 2023-08-11 ENCOUNTER — Telehealth: Payer: Self-pay

## 2023-08-16 ENCOUNTER — Inpatient Hospital Stay: Payer: Commercial Managed Care - HMO | Attending: Hematology and Oncology | Admitting: Hematology and Oncology

## 2023-08-16 ENCOUNTER — Other Ambulatory Visit: Payer: Self-pay | Admitting: Adult Health

## 2023-08-16 VITALS — BP 128/82 | HR 72 | Temp 98.1°F | Resp 18 | Ht 62.0 in | Wt 113.3 lb

## 2023-08-16 DIAGNOSIS — Z171 Estrogen receptor negative status [ER-]: Secondary | ICD-10-CM | POA: Diagnosis not present

## 2023-08-16 DIAGNOSIS — Z800 Family history of malignant neoplasm of digestive organs: Secondary | ICD-10-CM | POA: Insufficient documentation

## 2023-08-16 DIAGNOSIS — Z9013 Acquired absence of bilateral breasts and nipples: Secondary | ICD-10-CM | POA: Diagnosis not present

## 2023-08-16 DIAGNOSIS — C50512 Malignant neoplasm of lower-outer quadrant of left female breast: Secondary | ICD-10-CM | POA: Insufficient documentation

## 2023-08-16 DIAGNOSIS — Z9221 Personal history of antineoplastic chemotherapy: Secondary | ICD-10-CM | POA: Diagnosis not present

## 2023-08-16 NOTE — Progress Notes (Signed)
 Patient Care Team: Pcp, No as PCP - General Vernetta Berg, MD as Consulting Physician (General Surgery) Odean Potts, MD as Consulting Physician (Hematology and Oncology) Dewey Rush, MD as Consulting Physician (Radiation Oncology) Skeet Juliene SAUNDERS, DO as Consulting Physician (Neurology)  DIAGNOSIS:  Encounter Diagnosis  Name Primary?   Malignant neoplasm of lower-outer quadrant of left breast of female, estrogen receptor negative (HCC) Yes    SUMMARY OF ONCOLOGIC HISTORY: Oncology History  Malignant neoplasm of lower-outer quadrant of left breast of female, estrogen receptor negative (HCC)  09/07/2021 Initial Diagnosis   Screening mammogram detected left breast asymmetry posteriorly.  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  Report date is September 26, 2021 (BRCAPlus) and September 29, 2021 (expanded).    The CancerNext-Expanded gene panel offered by [facility] and includes sequencing, rearrangement, and RNA analysis for the following 77 genes: AIP, ALK, APC, ATM, AXIN2, BAP1, BARD1, BLM, BMPR1A, BRCA1, BRCA2, BRIP1, CDC73, CDH1, CDK4, CDKN1B, CDKN2A, CHEK2, CTNNA1, DICER1, FANCC, FH, FLCN, GALNT12, KIF1B, LZTR1, MAX, MEN1, MET, MLH1, MSH2, MSH3, MSH6, MUTYH, NBN, NF1, NF2, NTHL1, PALB2, PHOX2B, PMS2, POT1, PRKAR1A, PTCH1, PTEN, RAD51C, RAD51D, RB1, RECQL, RET, SDHA, SDHAF2, SDHB, SDHC, SDHD, SMAD4, SMARCA4, SMARCB1, SMARCE1, STK11, SUFU, TMEM127, TP53, TSC1, TSC2, VHL and XRCC2 (sequencing and  deletion/duplication); EGFR, EGLN1, HOXB13, KIT, MITF, PDGFRA, POLD1, and POLE (sequencing only); EPCAM and GREM1 (deletion/duplication only).    10/29/2021 Surgery   Bilateral mastectomies Right mastectomy: Benign, PASH Left mastectomy: Grade 3 IDC with DCIS 1.2 cm, margins negative, 0/5 sentinel lymph nodes negative, ER 0%, PR 0%, HER2 negative, Ki-67 70%   12/03/2021 - 06/03/2022 Chemotherapy   Patient is on Treatment Plan : BREAST ADJUVANT DOSE DENSE AC q14d / PACLitaxel  q7d       CHIEF COMPLIANT: Surveillance of breast cancer  HISTORY OF PRESENT ILLNESS:  History of Present Illness Julie Townsend is a 58 year old who had completed chemotherapy last year and is here for annual follow-up and surveillance.  She has been doing quite well without any problems or concerns.  She had nuclear for MRD testing which has been negative.  She is continuing plastic surgery breast reconstruction with latissimus dorsi flap.  Denies any pain or discomfort in bilateral chest wall or axilla.  The pain that she has is related to surgery.     ALLERGIES:  is allergic to bee venom and gadavist  [gadobutrol ].  MEDICATIONS:  Current Outpatient Medications  Medication Sig Dispense Refill   estradiol  (ESTRACE ) 0.1 MG/GM vaginal cream Place 4 g vaginally 2 (two) times a week.     Galcanezumab -gnlm (EMGALITY ) 120 MG/ML SOAJ Inject 120 mg into the skin every 28 (twenty-eight) days. 1 mL 11   SUMAtriptan  (IMITREX ) 50 MG tablet Take 1 tablet (50 mg total) by mouth as needed for migraine. May repeat in 2 hours if headache persists or recurs. 45 tablet 1   Ubrogepant  (UBRELVY ) 100 MG TABS TAKE 1 TABLET AT THE EARLIST ONSET OF A MIGRAINE. MAY REPEAT IN 2 HOURS. MAX 2/24 HOURS. (Patient taking  differently: as needed. TAKE 1 TABLET AT THE EARLIST ONSET OF A MIGRAINE. MAY REPEAT IN 2 HOURS. MAX 2/24 HOURS.) 16 tablet 11   EPINEPHrine  0.3 mg/0.3 mL IJ SOAJ injection Inject 0.3 mg into the muscle as needed for anaphylaxis. 1 each 1    No current facility-administered medications for this visit.    PHYSICAL EXAMINATION: ECOG PERFORMANCE STATUS: 1 - Symptomatic but completely ambulatory  Vitals:   08/16/23 1509  BP: 128/82  Pulse: 72  Resp: 18  Temp: 98.1 F (36.7 C)  SpO2: 100%   Filed Weights   08/16/23 1509  Weight: 113 lb 4.8 oz (51.4 kg)    Physical Exam   (exam performed in the presence of a chaperone)  LABORATORY DATA:  I have reviewed the data as listed    Latest Ref Rng & Units 02/16/2023   11:57 AM 09/07/2022   11:33 AM 07/16/2022    2:00 PM  CMP  Glucose 70 - 99 mg/dL 90  99  91   BUN 6 - 20 mg/dL 15  11  16    Creatinine 0.44 - 1.00 mg/dL 9.34  9.20  9.23   Sodium 135 - 145 mmol/L 139  137  138   Potassium 3.5 - 5.1 mmol/L 3.9  4.4  3.4   Chloride 98 - 111 mmol/L 103  103  102   CO2 22 - 32 mmol/L 29  24  26    Calcium 8.9 - 10.3 mg/dL 89.8  9.2  9.6   Total Protein 6.5 - 8.1 g/dL 8.2  7.4    Total Bilirubin 0.0 - 1.2 mg/dL 0.6  0.3    Alkaline Phos 38 - 126 U/L 67  73    AST 15 - 41 U/L 20  17    ALT 0 - 44 U/L 9  10      Lab Results  Component Value Date   WBC 3.5 (L) 02/16/2023   HGB 14.7 02/16/2023   HCT 42.9 02/16/2023   MCV 92.3 02/16/2023   PLT 264 02/16/2023   NEUTROABS 1.7 02/16/2023    ASSESSMENT & PLAN:  Malignant neoplasm of lower-outer quadrant of left breast of female, estrogen receptor negative (HCC) 09/07/2021:Screening mammogram detected left breast asymmetry posteriorly.  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, margins negative, 0/5 sentinel lymph nodes negative, ER 0%, PR 0%, HER2 negative, Ki-67 70% 06/02/2023: Plastic surgery on the breast October 2024: Bilateral salpingo-oophorectomy   Treatment plan: Adjuvant  chemotherapy with Adriamycin  and Cytoxan  followed by Taxol  started 12/03/2021-06/03/2022 No role of adjuvant radiation or antiestrogen therapy Signatera testing for MRD: Negative  Pancreatic cancer surveillance: Follows with Dr. San Return to clinic in 1 year for follow-up for telephone visit    No orders of the defined types were placed in this encounter.  The patient has a good understanding of the overall plan. she agrees with it. she will call with any problems that may develop before the next visit here. Total time spent: 30 mins including face to face time and time spent for planning, charting and co-ordination of care   Viinay K Keevan Wolz, MD 08/16/23

## 2023-08-16 NOTE — Assessment & Plan Note (Signed)
 09/07/2021:Screening mammogram detected left breast asymmetry posteriorly.  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, margins negative, 0/5 sentinel lymph nodes negative, ER 0%, PR 0%, HER2 negative, Ki-67 70% 06/02/2023: Plastic surgery on the breast   Treatment plan: Adjuvant chemotherapy with Adriamycin  and Cytoxan  followed by Taxol  started 12/03/2021-06/03/2022 No role of adjuvant radiation or antiestrogen therapy --------------------------------------------------------------------------------------------------------------------------------------------------------------------- Current treatment: Completed 4 cycles of dose dense Adriamycin  and Cytoxan , today cycle 12 Taxol    Chemotoxicities: Nausea:  fatigue Chemotherapy-induced anemia: Today's hemoglobin is 11.3 Body aches and pains at home: Improved with Claritin Neutropenia: Resolved with Neupogen  injection given couple of days before each chemo. 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   She will need a oophorectomy  Because of her pancreatic cancer risk I recommended that she see gastroenterology. Return to clinic in 1 year for follow-up

## 2023-08-17 ENCOUNTER — Telehealth: Payer: Self-pay

## 2023-08-17 NOTE — Telephone Encounter (Signed)
-----   Message from Sandor LULLA Flatter sent at 08/16/2023  4:36 PM EDT ----- Please contact patient to let her know H. pylori [employer] panel was negative, consistent with successful eradication.  No additional workup needed at this time for this.

## 2023-08-17 NOTE — Telephone Encounter (Signed)
 Patient informed of results per Dr San.  Patient agreed to plan and verbalized understanding.  No further questions.

## 2023-08-22 ENCOUNTER — Ambulatory Visit (INDEPENDENT_AMBULATORY_CARE_PROVIDER_SITE_OTHER): Admitting: Internal Medicine

## 2023-08-22 ENCOUNTER — Telehealth (HOSPITAL_BASED_OUTPATIENT_CLINIC_OR_DEPARTMENT_OTHER): Payer: Self-pay

## 2023-08-22 VITALS — BP 100/72 | HR 62 | Ht 62.0 in | Wt 113.0 lb

## 2023-08-22 DIAGNOSIS — E7800 Pure hypercholesterolemia, unspecified: Secondary | ICD-10-CM

## 2023-08-22 NOTE — Patient Instructions (Addendum)
 Medication Instructions:  Your physician recommends that you continue on your current medications as directed. Please refer to the Current Medication list given to you today.  *If you need a refill on your cardiac medications before your next appointment, please call your pharmacy*  Lab Work: FASTING Lipids- NMR and LPa today  If you have labs (blood work) drawn today and your tests are completely normal, you will receive your results only by: MyChart Message (if you have MyChart) OR A paper copy in the mail If you have any lab test that is abnormal or we need to change your treatment, we will call you to review the results.  Testing/Procedures: Genetic test for Familial Hypercholesterolemia ordered ([facility]) Cheek swab completed in office Specimen and necessary paperwork mailed  Dr. Mona  has ordered a CT coronary calcium score.   Test locations:  [facility]  [facility] at [facility]  This is $99 out of pocket.   Coronary CalciumScan A coronary calcium scan is an imaging test used to look for deposits of calcium and other fatty materials (plaques) in the inner lining of the blood vessels of the heart (coronary arteries). These deposits of calcium and plaques can partly clog and narrow the coronary arteries without producing any symptoms or warning signs. This puts a person at risk for a heart attack. This test can detect these deposits before symptoms develop. Tell a health care provider about: Any allergies you have. All medicines you are taking, including vitamins, herbs, eye drops, creams, and over-the-counter medicines. Any problems you or family members have had with anesthetic medicines. Any blood disorders you have. Any surgeries you have had. Any medical conditions you have. Whether you are pregnant or may be pregnant. What are the risks? Generally, this is a safe procedure. However,  problems may occur, including: Harm to a pregnant woman and her unborn baby. This test involves the use of radiation. Radiation exposure can be dangerous to a pregnant woman and her unborn baby. If you are pregnant, you generally should not have this procedure done. Slight increase in the risk of cancer. This is because of the radiation involved in the test. What happens before the procedure? No preparation is needed for this procedure. What happens during the procedure? You will undress and remove any jewelry around your neck or chest. You will put on a hospital gown. Sticky electrodes will be placed on your chest. The electrodes will be connected to an electrocardiogram (ECG) machine to record a tracing of the electrical activity of your heart. A CT scanner will take pictures of your heart. During this time, you will be asked to lie still and hold your breath for 2-3 seconds while a picture of your heart is being taken. The procedure may vary among health care providers and hospitals. What happens after the procedure? You can get dressed. You can return to your normal activities. It is up to you to get the results of your test. Ask your health care provider, or the department that is doing the test, when your results will be ready. Summary A coronary calcium scan is an imaging test used to look for deposits of calcium and other fatty materials (plaques) in the inner lining of the blood vessels of the heart (coronary arteries). Generally, this is a safe procedure. Tell your health care provider if you are pregnant or may be pregnant. No preparation is needed for this procedure. A CT  scanner will take pictures of your heart. You can return to your normal activities after the scan is done. This information is not intended to replace advice given to you by your health care provider. Make sure you discuss any questions you have with your health care provider. Document Released: 07/17/2007 Document  Revised: 12/08/2015 Document Reviewed: 12/08/2015 Elsevier Interactive Patient Education  2017 [employer].    Follow-Up: At [facility], you and your health needs are our priority.  As part of our continuing mission to provide you with exceptional heart care, our providers are all part of one team.  This team includes your primary Cardiologist (physician) and Advanced Practice Providers or APPs (Physician Assistants and Nurse Practitioners) who all work together to provide you with the care you need, when you need it.  Your next appointment:   TBD- based on test results  Other Instructions Genetic testing was completed in office. Results take about 2-3 weeks to come back. Dr. Mona will reach out with results.

## 2023-08-22 NOTE — Telephone Encounter (Signed)
 Genetic test for Hypercholesterolemia ordered ([facility]) Cheek swab completed in office Specimen and necessary paperwork mailed. ID: [id number]

## 2023-08-22 NOTE — Progress Notes (Signed)
 LIPID CLINIC CONSULT NOTE  Chief Complaint:  Manage dyslipidemia  Primary Care Physician: Pcp, No  Primary Cardiologist:  None  HPI:  Julie Townsend is a 58 y.o. female who is being seen today for the evaluation of dyslipidemia at the request of Crawford Jacobsen Cornett*.  This is a pleasant 58 year old female, referred for evaluation management of dyslipidemia.  She had recent lab work which shows significantly elevated cholesterol with a total of 328, triglycerides 81, HDL 94 and LDL 218.  She is not currently on any lipid-lowering therapy.  She was previously living in [place]  and is of [place] descent.  She unfortunately had a history of breast cancer and has had migraine headaches.  She does have a family history of cancer and had genetic testing indicating a BRCA2 variant in 2023.  She reports multiple family members with high cholesterol and including 2 children.  In the past she was told because of her high HDL cholesterol that she may not be an increased risk.  She has not been on lipid-lowering therapy.  She says she eats fairly healthy and tries avoid saturated fats.  She is of normal weight.  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  Report date is September 26, 2021.    The BRCAplus panel offered by [employer] and includes sequencing and deletion/duplication analysis for the following 8 genes: ATM, BRCA1, BRCA2, CDH1, CHEK2, PALB2, PTEN, and TP53.  Results of pan-cancer panel are pending.     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    correct  Hands   Port-A-Cath in place 03/10/2022    Past Surgical History:  Procedure Laterality Date   BONE BIOPSY  05/30/2023   Procedure: BIOPSY;  Surgeon: Wilhelmenia Aloha Raddle., MD;  Location: [facility];  Service: Gastroenterology;;   BREAST ENHANCEMENT SURGERY Bilateral 10/28/2022   BREAST IMPLANT REMOVAL Right 12/17/2021   Procedure: Right breast expander removal with washout;  Surgeon: Lowery Estefana RAMAN, DO;  Location: [facility];  Service: Plastics;  Laterality: Right;   BREAST RECONSTRUCTION WITH PLACEMENT OF TISSUE EXPANDER AND FLEX HD (ACELLULAR HYDRATED DERMIS) Bilateral 10/29/2021   Procedure: BREAST RECONSTRUCTION WITH PLACEMENT OF TISSUE EXPANDER AND FLEX HD (ACELLULAR HYDRATED DERMIS);  Surgeon: Lowery Estefana RAMAN, DO;  Location: [facility];  Service: Plastics;  Laterality: Bilateral;   BREAST RECONSTRUCTION WITH PLACEMENT OF TISSUE EXPANDER AND FLEX HD (ACELLULAR HYDRATED DERMIS) Right 07/26/2022   Procedure: PLACEMENT OF TISSUE EXPANDER;  Surgeon: Lowery Estefana RAMAN, DO;  Location: [facility];  Service: Plastics;  Laterality: Right;   CARPAL TUNNEL RELEASE Right 06/20/2020   Procedure: CARPAL TUNNEL RELEASE;  Surgeon: Murrell Drivers, MD;  Location: [facility];  Service: Orthopedics;  Laterality: Right;   DILATION AND CURETTAGE OF UTERUS     ESOPHAGOGASTRODUODENOSCOPY N/A 05/30/2023   Procedure: EGD (ESOPHAGOGASTRODUODENOSCOPY);  Surgeon: Wilhelmenia Aloha Raddle., MD;  Location: [facility];  Service: Gastroenterology;  Laterality: N/A;   EUS N/A 05/30/2023   Procedure: UPPER ENDOSCOPIC ULTRASOUND (EUS) RADIAL;  Surgeon:  Mansouraty, Aloha Raddle., MD;  Location: [facility];  Service: Gastroenterology;  Laterality: N/A;   IRRIGATION AND DEBRIDEMENT OF WOUND WITH SPLIT THICKNESS SKIN GRAFT Right 03/15/2022   Procedure: Debridement of right breast;  Surgeon: Lowery Estefana RAMAN, DO;  Location: [facility];  Service: Plastics;  Laterality: Right;    LATISSIMUS FLAP TO BREAST Right 07/26/2022   Procedure: Right breast reconstruction and latissimus muscle flap;  Surgeon: Lowery Estefana RAMAN, DO;  Location: [facility];  Service: Plastics;  Laterality: Right;   LIPOSUCTION WITH LIPOFILLING Bilateral 06/02/2023   Procedure: bilateral breast fat grafting;  Surgeon: Lowery Estefana RAMAN, DO;  Location: [facility];  Service: Plastics;  Laterality: Bilateral;   MASTECTOMY     MASTECTOMY W/ SENTINEL NODE BIOPSY Left 10/29/2021   Procedure: LEFT MASTECTOMY WITH LEFT SENTINEL LYMPH NODE BIOPSY;  Surgeon: Vernetta Berg, MD;  Location: [facility];  Service: General;  Laterality: Left;   PORT-A-CATH REMOVAL N/A 07/26/2022   Procedure: PORT-A-CATH REMOVAL;  Surgeon: Vernetta Berg, MD;  Location: [facility];  Service: General;  Laterality: N/A;   PORTACATH PLACEMENT Right 10/29/2021   Procedure: INSERTION PORT-A-CATH;  Surgeon: Vernetta Berg, MD;  Location: [facility];  Service: General;  Laterality: Right;   REMOVAL OF TISSUE EXPANDER AND PLACEMENT OF IMPLANT Bilateral 10/28/2022   Procedure: REMOVAL OF TISSUE EXPANDER AND PLACEMENT OF IMPLANT,  repair of tissue defect to right lateral chest wall;  Surgeon: Lowery Estefana RAMAN, DO;  Location: [facility];  Service: Plastics;  Laterality: Bilateral;   REVISION, RECONSTRUCTION, BREAST Right 06/02/2023   Procedure: Liposuction right breast with excess tissue removal and bilateral breast fat grafting;  Surgeon: Lowery Estefana RAMAN, DO;  Location: [facility];  Service: Plastics;  Laterality: Right;   ROBOTIC ASSISTED SALPINGO OOPHERECTOMY Bilateral 09/15/2022   Procedure: XI ROBOTIC ASSISTED SALPINGO OOPHORECTOMY;  Surgeon: Viktoria Comer SAUNDERS, MD;  Location: [facility];  Service: Gynecology;  Laterality: Bilateral;   TOTAL MASTECTOMY Right 10/29/2021   Procedure: RIGHT TOTAL MASTECTOMY;  Surgeon: Vernetta Berg, MD;  Location: [facility];  Service: General;  Laterality: Right;   TRIGGER FINGER RELEASE Left 05/08/2020   Procedure: LEFT LONG FINGER TRIGGER RELEASE AND LEFT RING FINGER TRIGGER RELEASE;  Surgeon: Murrell Drivers, MD;  Location: [facility];  Service: Orthopedics;  Laterality: Left;   trigger finger release rt hand      FAMHx:  Family History  Problem Relation Age of Onset   Migraines Mother    Breast cancer Mother 30       recurr at  78   Migraines Maternal Aunt    Colon cancer Maternal Uncle        dx late 60s   Pancreatic cancer Maternal Uncle 28   Migraines Maternal Grandmother    Ovarian cancer Maternal Grandmother        dx 21s    SOCHx:   reports that she has never smoked. She has never been exposed to tobacco smoke. She has never used smokeless tobacco. She reports that she does not drink alcohol and does not use drugs.  ALLERGIES:  Allergies  Allergen Reactions   Bee Venom Anaphylaxis    Yellow jackets only   Gadavist  [Gadobutrol ] Nausea And Vomiting    Patient began vomiting immed post gad injection.     ROS: Pertinent items noted in HPI and remainder of comprehensive ROS otherwise negative.  HOME MEDS: Current Outpatient Medications on File Prior to Visit  Medication Sig Dispense Refill   EPINEPHrine  0.3 mg/0.3 mL IJ SOAJ injection Inject 0.3 mg into the muscle as needed for anaphylaxis. 1 each 1   estradiol  (ESTRACE ) 0.1 MG/GM vaginal cream INSERT 4 GRAMS VAGINALLY USING APPLICATOR THREE TIMES WEEKLY 42.5 g 5   Galcanezumab -gnlm (EMGALITY ) 120 MG/ML SOAJ Inject 120 mg into the skin every 28 (twenty-eight) days. 1 mL 11   SUMAtriptan  (IMITREX ) 50 MG tablet Take 1 tablet (50 mg total) by mouth as needed for migraine. May repeat in 2 hours if headache persists or recurs. 45 tablet 1   Ubrogepant  (UBRELVY ) 100 MG TABS TAKE 1 TABLET AT THE EARLIST ONSET OF A MIGRAINE. MAY REPEAT IN 2 HOURS. MAX 2/24 HOURS. (Patient taking differently: as needed. TAKE 1 TABLET AT  THE EARLIST ONSET OF A MIGRAINE. MAY REPEAT IN 2 HOURS. MAX 2/24 HOURS.) 16 tablet 11   No current facility-administered medications on file prior to visit.    LABS/IMAGING: No results found for this or any previous visit (from the past 48 hours). No results found.  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   Mrs. Townsend has a probable familial hyperlipidemia with LDL greater than 190.  She has multiple family members with high cholesterol including her children.  Per guidelines she would be recommended to take statin therapy high intensity with 50% or greater level of lowering.  She is however interested in seeing if there is any detectable coronary disease would like a coronary calcium score.  I also recommended genetic testing which I think would be helpful to further understand the cause of her high cholesterol.  Will check an LP(a) as well.  Plan therapy based on those results.  Thanks again for the kind referral.  Vinie KYM Maxcy, MD, [facility], FNLA, FACP  [place]  [facility]  Medical Director of the Advanced Lipid Disorders &  Cardiovascular Risk Reduction Clinic Diplomate of the American Board of Clinical Lipidology Attending Cardiologist  Direct Dial: [phone]  Fax: [phone]  Website:  [email or web address]  Vinie BROCKS Jamine Wingate 08/22/2023, 1:48 PM

## 2023-08-24 LAB — LIPOPROTEIN A (LPA): Lipoprotein (a): 239.2 nmol/L — ABNORMAL HIGH (ref <75.0)

## 2023-08-24 LAB — NMR, LIPOPROFILE
Cholesterol, Total: 280 mg/dL — ABNORMAL HIGH (ref 100–199)
HDL Particle Number: 51.4 umol/L (ref >=30.5)
HDL-C: 86 mg/dL (ref >39)
LDL Particle Number: 1769 nmol/L — ABNORMAL HIGH (ref <1000)
LDL Size: 21.8 nm (ref >20.5)
LDL-C (NIH Calc): 176 mg/dL — ABNORMAL HIGH (ref 0–99)
LP-IR Score: 34 (ref <=45)
Small LDL Particle Number: 352 nmol/L (ref <=527)
Triglycerides: 106 mg/dL (ref 0–149)

## 2023-08-25 ENCOUNTER — Ambulatory Visit: Payer: Self-pay | Admitting: Internal Medicine

## 2023-08-25 DIAGNOSIS — E7800 Pure hypercholesterolemia, unspecified: Secondary | ICD-10-CM

## 2023-08-26 ENCOUNTER — Ambulatory Visit: Admitting: Plastic Surgery

## 2023-09-25 ENCOUNTER — Encounter (HOSPITAL_BASED_OUTPATIENT_CLINIC_OR_DEPARTMENT_OTHER): Payer: Self-pay | Admitting: Internal Medicine

## 2023-09-27 ENCOUNTER — Encounter: Payer: Self-pay | Admitting: Plastic Surgery

## 2023-09-27 ENCOUNTER — Ambulatory Visit (INDEPENDENT_AMBULATORY_CARE_PROVIDER_SITE_OTHER): Admitting: Plastic Surgery

## 2023-09-27 VITALS — BP 124/85 | HR 77 | Ht 62.0 in | Wt 112.8 lb

## 2023-09-27 DIAGNOSIS — Z171 Estrogen receptor negative status [ER-]: Secondary | ICD-10-CM | POA: Diagnosis not present

## 2023-09-27 DIAGNOSIS — L987 Excessive and redundant skin and subcutaneous tissue: Secondary | ICD-10-CM | POA: Diagnosis not present

## 2023-09-27 DIAGNOSIS — Z853 Personal history of malignant neoplasm of breast: Secondary | ICD-10-CM | POA: Diagnosis not present

## 2023-09-27 DIAGNOSIS — C50512 Malignant neoplasm of lower-outer quadrant of left female breast: Secondary | ICD-10-CM

## 2023-09-27 NOTE — Progress Notes (Signed)
 Procedure Note  Preoperative Dx: excess back skin from latissimus flap (dog ear)  Postoperative Dx: Same  Procedure: Excision of 1 x 2 cm excess skin back  Anesthesia: Lidocaine  1% with 1:100,000 epinephrine   Indication for Procedure: skin excess  Description of Procedure: Risks and complications were explained to the patient.  Consent was confirmed and the patient understands the risks and benefits.  The potential complications and alternatives were explained and the patient consents.  The patient expressed understanding the option of not having the procedure and the risks of a scar.  Time out was called and all information was confirmed to be correct.    The area was prepped and drapped.  Lidocaine  1% with epinephrine  was injected in the subcutaneous area.  After waiting several minutes for the local to take affect a #15 blade was used to excise the area in an eliptical pattern.  A 5-0 Monocryl was used to close the deep layers with simple interrupted stitches.  The skin edges were reapproximated with 5-0 Monocryl subcuticular running closure.  A dressing was applied.  The patient was given instructions on how to care for the area and a follow up appointment.  Brandy tolerated the procedure well and there were no complications.

## 2023-09-28 ENCOUNTER — Ambulatory Visit (HOSPITAL_BASED_OUTPATIENT_CLINIC_OR_DEPARTMENT_OTHER)
Admission: RE | Admit: 2023-09-28 | Discharge: 2023-09-28 | Disposition: A | Discharge status: Home / Self Care | Payer: Self-pay | Source: Ambulatory Visit | Attending: Internal Medicine | Admitting: Internal Medicine

## 2023-09-28 DIAGNOSIS — E7800 Pure hypercholesterolemia, unspecified: Secondary | ICD-10-CM | POA: Insufficient documentation

## 2023-09-30 NOTE — Telephone Encounter (Signed)
 MD called patient.

## 2023-10-01 ENCOUNTER — Other Ambulatory Visit: Payer: Self-pay | Admitting: Neurology

## 2023-10-04 MED ORDER — ROSUVASTATIN CALCIUM 20 MG PO TABS: 20.0000 mg | ORAL_TABLET | Freq: Every day | ORAL | 3 refills | Status: AC

## 2023-10-06 ENCOUNTER — Encounter: Payer: Self-pay | Admitting: Internal Medicine

## 2023-10-06 LAB — SIGNATERA ONLY (NATERA MANAGED)
SIGNATERA MTM READOUT: 0 MTM/ml
SIGNATERA TEST RESULT: NEGATIVE

## 2023-10-12 ENCOUNTER — Other Ambulatory Visit: Payer: Self-pay | Admitting: *Deleted

## 2023-10-12 ENCOUNTER — Encounter: Payer: Self-pay | Admitting: Hematology and Oncology

## 2023-10-12 DIAGNOSIS — C50512 Malignant neoplasm of lower-outer quadrant of left female breast: Secondary | ICD-10-CM

## 2023-10-12 NOTE — Progress Notes (Unsigned)
 Patient is a 58 year old female with history of breast cancer status post breast reconstruction.  She most recently underwent excision of dogear to her back with Dr. Lowery here in the office on 09/27/2023.  She is about 2 weeks out from her procedure.  She presents to the clinic today for postprocedural follow-up.  Today, patient reports she is overall doing well after her procedure.  She states that it is a little bit tender, but reports she has been taking Tylenol  which has been helping.  She denies any fevers or chills.  Patient does state that she has a lesion to her left breast which has been present for about the past year and has been itching.  She states that the lesion has not changed in size.  Patient also states that she recently has been having some difficulty with range of motion of her right upper extremity.  Chaperone present on exam.  On exam, patient is sitting upright in no acute distress.  To the left breast, there is a small pink-colored papule noted to the medial left breast.  There is no drainage noted.  There is no tenderness to palpation.  There is no suture noted underneath the skin.  To the right back, Steri-Strip was in place.  This was removed.  Revision incision site appears to be intact and healing well with Monocryl sutures in place.  There is no surrounding erythema.  There is mild tenderness to palpation.  There is no fluid collection noted on exam.  There is a little bit of firmness underneath the incision, consistent with scar tissue.  Monocryl suture was removed.  Patient tolerated well.  There are no signs of infection on exam.  Limited range of motion of the right upper extremity is also noted.  Given persistent papule to the left breast, recommended that patient see dermatology.  She expressed understanding and was in agreement with this.  I will go ahead and send a referral.  I recommended that patient apply Vaseline to her revision incision daily for the next week  or so, and then I discussed with her she may transition to scar creams.  Recommended that she gently massage the incision to help break up some of the scar tissue.  She expressed understanding.  Given new difficulties with range of motion, recommended that patient go back to physical therapy to see if this might help with things.  She expressed understanding was in agreement with this.  Will plan to do a telephone visit in about 3 months to check-in.  Recommended that patient see Dr. Lowery in about 1 year.  I instructed the patient to call if she has any questions or concerns about anything.  Pictures were obtained of the patient and placed in the chart with the patient's or guardian's permission.

## 2023-10-12 NOTE — Progress Notes (Signed)
 [facility] renewal orders placed.

## 2023-10-13 ENCOUNTER — Ambulatory Visit (INDEPENDENT_AMBULATORY_CARE_PROVIDER_SITE_OTHER): Admitting: Student

## 2023-10-13 ENCOUNTER — Encounter: Payer: Self-pay | Admitting: Student

## 2023-10-13 VITALS — BP 122/82 | HR 84 | Ht 62.0 in | Wt 114.6 lb

## 2023-10-13 DIAGNOSIS — Z9889 Other specified postprocedural states: Secondary | ICD-10-CM

## 2023-10-13 NOTE — Therapy (Signed)
 OUTPATIENT PHYSICAL THERAPY  UPPER EXTREMITY ONCOLOGY EVALUATION  Patient Name: Julie Townsend MRN: [id number] DOB:08-10-1965, 58 y.o., female Today's Date: 10/14/2023  END OF SESSION:  PT End of Session - 10/14/23 1113     Visit Number 1    Number of Visits 9    Date for PT Re-Evaluation 11/11/23    Authorization Type none needed    PT Start Time 1004    PT Stop Time 1047    PT Time Calculation (min) 43 min    Activity Tolerance Patient tolerated treatment well    Behavior During Therapy [facility] for tasks assessed/performed           Past Medical History:  Diagnosis Date   BRCA2 gene mutation positive 09/26/2021   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  Report date is September 26, 2021.    The BRCAplus panel offered by [employer] and includes sequencing and deletion/duplication analysis for the following 8 genes: ATM, BRCA1, BRCA2, CDH1, CHEK2, PALB2, PTEN, and TP53.  Results of pan-cancer panel are pending.     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    correct Hands   Port-A-Cath in place 03/10/2022   Past Surgical History:  Procedure Laterality Date   BONE BIOPSY  05/30/2023   Procedure: BIOPSY;  Surgeon: Wilhelmenia Aloha Raddle., MD;  Location: [facility];  Service: Gastroenterology;;   BREAST ENHANCEMENT SURGERY Bilateral 10/28/2022   BREAST IMPLANT REMOVAL Right 12/17/2021   Procedure: Right breast expander removal with washout;  Surgeon: Lowery Estefana RAMAN, DO;  Location: [facility];  Service: Plastics;  Laterality: Right;   BREAST RECONSTRUCTION WITH PLACEMENT OF TISSUE EXPANDER AND FLEX HD (ACELLULAR HYDRATED  DERMIS) Bilateral 10/29/2021   Procedure: BREAST RECONSTRUCTION WITH PLACEMENT OF TISSUE EXPANDER AND FLEX HD (ACELLULAR HYDRATED DERMIS);  Surgeon: Lowery Estefana RAMAN, DO;  Location: [facility];  Service: Plastics;  Laterality: Bilateral;   BREAST RECONSTRUCTION WITH PLACEMENT OF TISSUE EXPANDER AND FLEX HD (ACELLULAR HYDRATED DERMIS) Right 07/26/2022   Procedure: PLACEMENT OF TISSUE EXPANDER;  Surgeon: Lowery Estefana RAMAN, DO;  Location: [facility];  Service: Plastics;  Laterality: Right;   CARPAL TUNNEL RELEASE Right 06/20/2020   Procedure: CARPAL TUNNEL RELEASE;  Surgeon: Murrell Drivers, MD;  Location:  SURGERY CENTER;  Service: Orthopedics;  Laterality: Right;   DILATION AND CURETTAGE OF UTERUS     ESOPHAGOGASTRODUODENOSCOPY N/A 05/30/2023   Procedure: EGD (ESOPHAGOGASTRODUODENOSCOPY);  Surgeon: Wilhelmenia Aloha Raddle., MD;  Location: [facility];  Service: Gastroenterology;  Laterality: N/A;   EUS N/A 05/30/2023   Procedure: UPPER ENDOSCOPIC ULTRASOUND (EUS) RADIAL;  Surgeon: Wilhelmenia Aloha Raddle., MD;  Location: [facility];  Service: Gastroenterology;  Laterality: N/A;   IRRIGATION AND DEBRIDEMENT OF WOUND WITH SPLIT THICKNESS SKIN GRAFT Right 03/15/2022   Procedure: Debridement of right breast;  Surgeon: Lowery Estefana RAMAN, DO;  Location: [facility];  Service: Plastics;  Laterality: Right;   LATISSIMUS FLAP TO BREAST Right 07/26/2022   Procedure: Right breast reconstruction and latissimus muscle flap;  Surgeon: Lowery Estefana RAMAN, DO;  Location: [facility];  Service: Plastics;  Laterality: Right;   LIPOSUCTION WITH LIPOFILLING Bilateral  06/02/2023   Procedure: bilateral breast fat grafting;  Surgeon: Lowery Estefana RAMAN, DO;  Location: [facility];  Service: Plastics;  Laterality: Bilateral;   MASTECTOMY     MASTECTOMY W/ SENTINEL NODE BIOPSY Left 10/29/2021   Procedure: LEFT MASTECTOMY WITH LEFT SENTINEL LYMPH NODE BIOPSY;  Surgeon: Vernetta Berg, MD;   Location: [facility];  Service: General;  Laterality: Left;   PORT-A-CATH REMOVAL N/A 07/26/2022   Procedure: PORT-A-CATH REMOVAL;  Surgeon: Vernetta Berg, MD;  Location: [facility];  Service: General;  Laterality: N/A;   PORTACATH PLACEMENT Right 10/29/2021   Procedure: INSERTION PORT-A-CATH;  Surgeon: Vernetta Berg, MD;  Location: [facility];  Service: General;  Laterality: Right;   REMOVAL OF TISSUE EXPANDER AND PLACEMENT OF IMPLANT Bilateral 10/28/2022   Procedure: REMOVAL OF TISSUE EXPANDER AND PLACEMENT OF IMPLANT,  repair of tissue defect to right lateral chest wall;  Surgeon: Lowery Estefana RAMAN, DO;  Location: [facility];  Service: Plastics;  Laterality: Bilateral;   REVISION, RECONSTRUCTION, BREAST Right 06/02/2023   Procedure: Liposuction right breast with excess tissue removal and bilateral breast fat grafting;  Surgeon: Lowery Estefana RAMAN, DO;  Location: [facility];  Service: Plastics;  Laterality: Right;   ROBOTIC ASSISTED SALPINGO OOPHERECTOMY Bilateral 09/15/2022   Procedure: XI ROBOTIC ASSISTED SALPINGO OOPHORECTOMY;  Surgeon: Viktoria Comer SAUNDERS, MD;  Location: [facility];  Service: Gynecology;  Laterality: Bilateral;   TOTAL MASTECTOMY Right 10/29/2021   Procedure: RIGHT TOTAL MASTECTOMY;  Surgeon: Vernetta Berg, MD;  Location: [facility];  Service: General;  Laterality: Right;   TRIGGER FINGER RELEASE Left 05/08/2020   Procedure: LEFT LONG FINGER TRIGGER RELEASE AND LEFT RING FINGER TRIGGER RELEASE;  Surgeon: Murrell Drivers, MD;  Location: [facility];  Service: Orthopedics;  Laterality: Left;   trigger finger release rt hand     Patient Active Problem List   Diagnosis Date Noted   Excess skin 09/27/2023   Gastritis and gastroduodenitis 05/30/2023   Screening for cancer 05/30/2023   Acquired absence of right breast 07/26/2022   Cellulitis of right breast 03/10/2022   Medication  management 03/10/2022   Breast cancer (HCC) 10/29/2021   BRCA2 gene mutation positive 09/26/2021   Genetic testing 09/26/2021   Family history of breast cancer 09/22/2021   Family history of pancreatic cancer 09/22/2021   Family history of ovarian cancer 09/22/2021   Malignant neoplasm of lower-outer quadrant of left breast of female, estrogen receptor negative (HCC) 09/11/2021    PCP: None  REFERRING PROVIDER: Estefana Peck PA-C  REFERRING DIAG:  Diagnosis  Z98.890 (ICD-10-CM) - S/P breast reconstruction    THERAPY DIAG:  Stiffness of right shoulder, not elsewhere classified  Breast pain, right  Aftercare following surgery for neoplasm  Malignant neoplasm of lower-outer quadrant of left breast of female, estrogen receptor negative (HCC)  Malignant neoplasm of upper-outer quadrant of left breast in female, estrogen receptor negative (HCC)  Abnormal posture  ONSET DATE: 10/29/21  Rationale for Evaluation and Treatment: Rehabilitation  SUBJECTIVE:  SUBJECTIVE STATEMENT: Around 2 weeks ago I was putting on a sweater and I felt like it was a pop in the back region.  So bad it brought me down to my knees.  Then I had to protect my arm.  It also feels like a bit throbbing.    PERTINENT HISTORY: She most recently underwent excision of dogear on the back 09/27/23. Hx of bilateral exchange of tissue expanders for implants and bilateral capsulotomies for implant repositioning with Dr. Lowery on 10/28/2022. Lt breast cancer 2023 with bilateral mastectomy 10/29/21 with 5 negative nodes. 12/17/21 - removal of Rt expander with washout. 07/26/22 - Rt lat flap and reconstruction with another expander. The Right side has a hx of 3 staph infections.   Completed chemo with ACT.  No radiation.    PAIN:  Are you  having pain? Yes NPRS scale: 5/10 Pain location: the Rt inferior lateral breast , intermittently around the latissimus incision 9/10 and that happens only intermittently and is a quick pain.   Pain orientation: Right  PAIN TYPE: dull and sharp Pain description: constant  Aggravating factors: driving, pulling the doors shut,   Relieving factors: nothing makes the breast feel better, they think it is maybe scar tissue.   PRECAUTIONS: Lt lymphedema risk    RED FLAGS: None   WEIGHT BEARING RESTRICTIONS: No  FALLS:  Has patient fallen in last 6 months? No  LIVING ENVIRONMENT: Lives with: lives with their family, lives with their spouse, and lives with their daughter  OCCUPATION: work at home.  No limitation   LEISURE: gardening, crocheting, - limited with gardening now   HAND DOMINANCE: right   PRIOR LEVEL OF FUNCTION: Independent  PATIENT GOALS: get back to normal.     OBJECTIVE: Note: Objective measures were completed at Evaluation unless otherwise noted.  COGNITION: Overall cognitive status: Within functional limits for tasks assessed   PALPATION: Very tender to light palpation Rt breast, trunk, chest wall and around lat incision.  Scar tissue thicker Rt lateral breast   OBSERVATIONS / OTHER ASSESSMENTS: well healed incisions, increased scar tissue Rt breast  POSTURE: WNL  UPPER EXTREMITY AROM/PROM:  A/PROM RIGHT   eval   Shoulder extension 60  Shoulder flexion 140 - pull  Shoulder abduction 120 - pn - having to sit down   Shoulder internal rotation   Shoulder external rotation 90 - pn near lat    (Blank rows = not tested)  A/PROM LEFT   eval  Shoulder extension 60  Shoulder flexion 165  Shoulder abduction 165  Shoulder internal rotation   Shoulder external rotation 105    (Blank rows = not tested)  CERVICAL AROM: All within normal limits:   UPPER EXTREMITY STRENGTH:   L-DEX LYMPHEDEMA SCREENING: The patient was assessed using the L-Dex machine  today to produce a lymphedema index baseline score. The patient will be reassessed on a regular basis (typically every 3 months) to obtain new L-Dex scores. If the score is > 6.5 points away from his/her baseline score indicating onset of subclinical lymphedema, it will be recommended to wear a compression garment for 4 weeks, 12 hours per day and then be reassessed. If the score continues to be > 6.5 points from baseline at reassessment, we will initiate lymphedema treatment. Assessing in this manner has a 95% rate of preventing clinically significant lymphedema.  QUICK DASH SURVEY: 54%    TODAY'S TREATMENT:  DATE:  10/14/23 Eval performed Due to pt being familiar to started right into treatment Manual Therapy: Lt sidelying STM to the Rt latissimus from incision to insertion with gentle pressure, Then in supine to the latissimus, lateral chest/breast with arm around 40deg abducted and then with PROM into flexion and abduction.  Reviewed HEP to start including scapular retractions, wall walking into flexion and abduction  PATIENT EDUCATION:  Education details: per today's note Person educated: Patient Education method: Programmer, multimedia, Demonstration, Tactile cues, Verbal cues, and Handouts Education comprehension: verbalized understanding  HOME EXERCISE PROGRAM: Wall walking flexion and abduction, scapular retractions   ASSESSMENT:  CLINICAL IMPRESSION: Patient is a 58 y.o. female who was seen today for physical therapy evaluation and treatment for new but similar pain, decreased mobility, and hyper sensitivity after feeling fine for the past year.  She recently did have a dogear removal in clinic that correlated to the start of this new pain.  She noted what felt like a pull in the back near her old incision when putting on a sweater. This pain was so bad she  fell down to her knees.  This pain is still now present constantly as a throb and sharply with movement.  She is now limited with her gardening and lifting.  Will focus on regaining motion, and improving pain on the Rt side to return to lifting, gardening, etc.     OBJECTIVE IMPAIRMENTS: decreased activity tolerance, decreased mobility, decreased ROM, decreased strength, and increased fascial restrictions.   ACTIVITY LIMITATIONS: carrying and lifting  PARTICIPATION LIMITATIONS: community activity and yard work  PERSONAL FACTORS: surgical hx are also affecting patient's functional outcome.   REHAB POTENTIAL: Good  CLINICAL DECISION MAKING: Stable/uncomplicated  EVALUATION COMPLEXITY: Low  GOALS: Goals reviewed with patient? Yes   LONG TERM GOALS: Target date: 11/11/23  Pt will return to gardening and weight lifting activities without limitations  Baseline:  Goal status: INITIAL  2.  Pt will improve AROM to equal to the previous levels to improve mobility (flexion 150, abd 160) Baseline:  Goal status: INITIAL  3.  Pt will tolerate normal STM pressure to demonstrate less sensitivity.  Baseline:  Goal status: INITIAL   PLAN:  PT FREQUENCY: 2x/week  PT DURATION: 6 weeks  PLANNED INTERVENTIONS: [id number]- PT Re-evaluation, 97110-Therapeutic exercises, 97530- Therapeutic activity, 97535- Self Care, [id number]- Manual therapy, Patient/Family education, Balance training, Taping, Dry Needling, Joint mobilization, Therapeutic exercises, Therapeutic activity, and Self Care  PLAN FOR NEXT SESSION: Rt trunk STM, desensitization, cupping, taping, MARCILLE Larue Saddie JONELLE, PT 10/14/2023, 11:15 AM

## 2023-10-14 ENCOUNTER — Encounter: Payer: Self-pay | Admitting: Rehabilitation

## 2023-10-14 ENCOUNTER — Ambulatory Visit: Attending: Student | Admitting: Rehabilitation

## 2023-10-14 ENCOUNTER — Other Ambulatory Visit: Payer: Self-pay

## 2023-10-14 DIAGNOSIS — Z171 Estrogen receptor negative status [ER-]: Secondary | ICD-10-CM | POA: Diagnosis present

## 2023-10-14 DIAGNOSIS — C50412 Malignant neoplasm of upper-outer quadrant of left female breast: Secondary | ICD-10-CM | POA: Diagnosis present

## 2023-10-14 DIAGNOSIS — R293 Abnormal posture: Secondary | ICD-10-CM | POA: Insufficient documentation

## 2023-10-14 DIAGNOSIS — N644 Mastodynia: Secondary | ICD-10-CM | POA: Insufficient documentation

## 2023-10-14 DIAGNOSIS — Z9889 Other specified postprocedural states: Secondary | ICD-10-CM | POA: Diagnosis not present

## 2023-10-14 DIAGNOSIS — C50512 Malignant neoplasm of lower-outer quadrant of left female breast: Secondary | ICD-10-CM | POA: Insufficient documentation

## 2023-10-14 DIAGNOSIS — Z483 Aftercare following surgery for neoplasm: Secondary | ICD-10-CM | POA: Diagnosis present

## 2023-10-14 DIAGNOSIS — M25611 Stiffness of right shoulder, not elsewhere classified: Secondary | ICD-10-CM | POA: Diagnosis present

## 2023-10-19 ENCOUNTER — Ambulatory Visit: Admitting: Rehabilitation

## 2023-10-19 ENCOUNTER — Encounter: Payer: Self-pay | Admitting: Rehabilitation

## 2023-10-19 DIAGNOSIS — N644 Mastodynia: Secondary | ICD-10-CM

## 2023-10-19 DIAGNOSIS — M25611 Stiffness of right shoulder, not elsewhere classified: Secondary | ICD-10-CM

## 2023-10-19 DIAGNOSIS — Z171 Estrogen receptor negative status [ER-]: Secondary | ICD-10-CM

## 2023-10-19 DIAGNOSIS — R293 Abnormal posture: Secondary | ICD-10-CM

## 2023-10-19 DIAGNOSIS — Z483 Aftercare following surgery for neoplasm: Secondary | ICD-10-CM

## 2023-10-19 DIAGNOSIS — C50512 Malignant neoplasm of lower-outer quadrant of left female breast: Secondary | ICD-10-CM

## 2023-10-19 NOTE — Therapy (Signed)
 OUTPATIENT PHYSICAL THERAPY  UPPER EXTREMITY ONCOLOGY EVALUATION  Patient Name: Julie Townsend MRN: [id number] DOB:07/14/1965, 58 y.o., female Today's Date: 10/19/2023  END OF SESSION:  PT End of Session - 10/19/23 1312     Visit Number 2    Number of Visits 9    Date for PT Re-Evaluation 11/11/23    PT Start Time 1204    PT Stop Time 1252    PT Time Calculation (min) 48 min    Activity Tolerance Patient tolerated treatment well    Behavior During Therapy [facility] for tasks assessed/performed            Past Medical History:  Diagnosis Date   BRCA2 gene mutation positive 09/26/2021   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  Report date is September 26, 2021.    The BRCAplus panel offered by [employer] and includes sequencing and deletion/duplication analysis for the following 8 genes: ATM, BRCA1, BRCA2, CDH1, CHEK2, PALB2, PTEN, and TP53.  Results of pan-cancer panel are pending.     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    correct Hands   Port-A-Cath in place 03/10/2022   Past Surgical History:  Procedure Laterality Date   BONE BIOPSY  05/30/2023   Procedure: BIOPSY;  Surgeon: Wilhelmenia Aloha Raddle., MD;  Location: [facility];  Service: Gastroenterology;;   BREAST ENHANCEMENT SURGERY Bilateral 10/28/2022   BREAST IMPLANT REMOVAL Right 12/17/2021   Procedure: Right breast expander removal with washout;  Surgeon: Lowery Estefana RAMAN, DO;  Location: [facility];  Service: Plastics;  Laterality: Right;   BREAST RECONSTRUCTION WITH PLACEMENT OF TISSUE EXPANDER AND FLEX HD (ACELLULAR HYDRATED DERMIS) Bilateral 10/29/2021    Procedure: BREAST RECONSTRUCTION WITH PLACEMENT OF TISSUE EXPANDER AND FLEX HD (ACELLULAR HYDRATED DERMIS);  Surgeon: Lowery Estefana RAMAN, DO;  Location: [facility];  Service: Plastics;  Laterality: Bilateral;   BREAST RECONSTRUCTION WITH PLACEMENT OF TISSUE EXPANDER AND FLEX HD (ACELLULAR HYDRATED DERMIS) Right 07/26/2022   Procedure: PLACEMENT OF TISSUE EXPANDER;  Surgeon: Lowery Estefana RAMAN, DO;  Location: [facility];  Service: Plastics;  Laterality: Right;   CARPAL TUNNEL RELEASE Right 06/20/2020   Procedure: CARPAL TUNNEL RELEASE;  Surgeon: Murrell Drivers, MD;  Location: [facility];  Service: Orthopedics;  Laterality: Right;   DILATION AND CURETTAGE OF UTERUS     ESOPHAGOGASTRODUODENOSCOPY N/A 05/30/2023   Procedure: EGD (ESOPHAGOGASTRODUODENOSCOPY);  Surgeon: Wilhelmenia Aloha Raddle., MD;  Location: [facility];  Service: Gastroenterology;  Laterality: N/A;   EUS N/A 05/30/2023   Procedure: UPPER ENDOSCOPIC ULTRASOUND (EUS) RADIAL;  Surgeon: Wilhelmenia Aloha Raddle., MD;  Location: [facility];  Service: Gastroenterology;  Laterality: N/A;   IRRIGATION AND DEBRIDEMENT OF WOUND WITH SPLIT THICKNESS SKIN GRAFT Right 03/15/2022   Procedure: Debridement of right breast;  Surgeon: Lowery Estefana RAMAN, DO;  Location: [facility];  Service: Plastics;  Laterality: Right;   LATISSIMUS FLAP TO BREAST Right 07/26/2022   Procedure: Right breast reconstruction and latissimus muscle flap;  Surgeon: Lowery Estefana RAMAN, DO;  Location: [facility];  Service: Plastics;  Laterality: Right;   LIPOSUCTION WITH LIPOFILLING Bilateral 06/02/2023   Procedure: bilateral breast  fat grafting;  Surgeon: Lowery Estefana RAMAN, DO;  Location: [facility];  Service: Plastics;  Laterality: Bilateral;   MASTECTOMY     MASTECTOMY W/ SENTINEL NODE BIOPSY Left 10/29/2021   Procedure: LEFT MASTECTOMY WITH LEFT SENTINEL LYMPH NODE BIOPSY;  Surgeon: Vernetta Berg, MD;  Location: [facility];  Service: General;  Laterality: Left;   PORT-A-CATH REMOVAL N/A 07/26/2022   Procedure: PORT-A-CATH REMOVAL;  Surgeon: Vernetta Berg, MD;  Location: [facility];  Service: General;  Laterality: N/A;   PORTACATH PLACEMENT Right 10/29/2021   Procedure: INSERTION PORT-A-CATH;  Surgeon: Vernetta Berg, MD;  Location: [facility];  Service: General;  Laterality: Right;   REMOVAL OF TISSUE EXPANDER AND PLACEMENT OF IMPLANT Bilateral 10/28/2022   Procedure: REMOVAL OF TISSUE EXPANDER AND PLACEMENT OF IMPLANT,  repair of tissue defect to right lateral chest wall;  Surgeon: Lowery Estefana RAMAN, DO;  Location: [facility];  Service: Plastics;  Laterality: Bilateral;   REVISION, RECONSTRUCTION, BREAST Right 06/02/2023   Procedure: Liposuction right breast with excess tissue removal and bilateral breast fat grafting;  Surgeon: Lowery Estefana RAMAN, DO;  Location: [facility];  Service: Plastics;  Laterality: Right;   ROBOTIC ASSISTED SALPINGO OOPHERECTOMY Bilateral 09/15/2022   Procedure: XI ROBOTIC ASSISTED SALPINGO OOPHORECTOMY;  Surgeon: Viktoria Comer SAUNDERS, MD;  Location: [facility];  Service: Gynecology;  Laterality: Bilateral;   TOTAL MASTECTOMY Right 10/29/2021   Procedure: RIGHT TOTAL MASTECTOMY;  Surgeon: Vernetta Berg, MD;  Location: [facility];  Service: General;  Laterality: Right;   TRIGGER FINGER RELEASE Left 05/08/2020   Procedure: LEFT LONG FINGER TRIGGER RELEASE AND LEFT RING FINGER TRIGGER RELEASE;  Surgeon: Murrell Drivers, MD;  Location: [facility];  Service: Orthopedics;  Laterality: Left;   trigger finger release rt hand     Patient Active Problem List   Diagnosis Date Noted   Excess skin 09/27/2023   Gastritis and gastroduodenitis 05/30/2023   Screening for cancer 05/30/2023   Acquired absence of right breast 07/26/2022   Cellulitis of right breast 03/10/2022   Medication management 03/10/2022   Breast  cancer (HCC) 10/29/2021   BRCA2 gene mutation positive 09/26/2021   Genetic testing 09/26/2021   Family history of breast cancer 09/22/2021   Family history of pancreatic cancer 09/22/2021   Family history of ovarian cancer 09/22/2021   Malignant neoplasm of lower-outer quadrant of left breast of female, estrogen receptor negative (HCC) 09/11/2021    PCP: None  REFERRING PROVIDER: Estefana Peck PA-C  REFERRING DIAG:  Diagnosis  Z98.890 (ICD-10-CM) - S/P breast reconstruction    THERAPY DIAG:  Stiffness of right shoulder, not elsewhere classified  Breast pain, right  Aftercare following surgery for neoplasm  Malignant neoplasm of lower-outer quadrant of left breast of female, estrogen receptor negative (HCC)  Malignant neoplasm of upper-outer quadrant of left breast in female, estrogen receptor negative (HCC)  Abnormal posture  ONSET DATE: 10/29/21  Rationale for Evaluation and Treatment: Rehabilitation  SUBJECTIVE:  SUBJECTIVE STATEMENT: It is feeling better.    PERTINENT HISTORY: She most recently underwent excision of dogear on the back 09/27/23. Hx of bilateral exchange of tissue expanders for implants and bilateral capsulotomies for implant repositioning with Dr. Lowery on 10/28/2022. Lt breast cancer 2023 with bilateral mastectomy 10/29/21 with 5 negative nodes. 12/17/21 - removal of Rt expander with washout. 07/26/22 - Rt lat flap and reconstruction with another expander. The Right side has a hx of 3 staph infections.   Completed chemo with ACT.  No radiation.    PAIN:  Are you having pain? Yes NPRS scale: 5/10 Pain location: the Rt inferior lateral breast , intermittently around the latissimus incision 9/10 and that happens only intermittently and is a quick pain.   Pain  orientation: Right  PAIN TYPE: dull and sharp Pain description: constant  Aggravating factors: driving, pulling the doors shut,   Relieving factors: nothing makes the breast feel better, they think it is maybe scar tissue.   PRECAUTIONS: Lt lymphedema risk    RED FLAGS: None   WEIGHT BEARING RESTRICTIONS: No  FALLS:  Has patient fallen in last 6 months? No  LIVING ENVIRONMENT: Lives with: lives with their family, lives with their spouse, and lives with their daughter  OCCUPATION: work at home.  No limitation   LEISURE: gardening, crocheting, - limited with gardening now   HAND DOMINANCE: right   PRIOR LEVEL OF FUNCTION: Independent  PATIENT GOALS: get back to normal.     OBJECTIVE: Note: Objective measures were completed at Evaluation unless otherwise noted.  COGNITION: Overall cognitive status: Within functional limits for tasks assessed   PALPATION: Very tender to light palpation Rt breast, trunk, chest wall and around lat incision.  Scar tissue thicker Rt lateral breast   OBSERVATIONS / OTHER ASSESSMENTS: well healed incisions, increased scar tissue Rt breast  POSTURE: WNL  UPPER EXTREMITY AROM/PROM:  A/PROM RIGHT   eval  10/19/23  Shoulder extension 60   Shoulder flexion 140 - pull 150  Shoulder abduction 120 - pn - having to sit down  110 - pn  Shoulder internal rotation    Shoulder external rotation 90 - pn near lat     (Blank rows = not tested)  A/PROM LEFT   eval  Shoulder extension 60  Shoulder flexion 165  Shoulder abduction 165  Shoulder internal rotation   Shoulder external rotation 105    (Blank rows = not tested)  CERVICAL AROM: All within normal limits:   UPPER EXTREMITY STRENGTH:   L-DEX LYMPHEDEMA SCREENING: The patient was assessed using the L-Dex machine today to produce a lymphedema index baseline score. The patient will be reassessed on a regular basis (typically every 3 months) to obtain new L-Dex scores. If the score is > 6.5  points away from his/her baseline score indicating onset of subclinical lymphedema, it will be recommended to wear a compression garment for 4 weeks, 12 hours per day and then be reassessed. If the score continues to be > 6.5 points from baseline at reassessment, we will initiate lymphedema treatment. Assessing in this manner has a 95% rate of preventing clinically significant lymphedema.  QUICK DASH SURVEY: 54%    TODAY'S TREATMENT:  DATE:  10/19/23 Manual Therapy: Supine STM to Rt pectoralis and in Lt sidelying STM to the Rt latissimus from incision to insertion with gentle pressure, Then scar tissue mobilization using stretch in all directions following along the borders and then directly on the incision.  PROM into flexion and abduction which was limited due to tingling starting in fingers 3-5.  Had pt perform seated ulnar nerve flossing in tea pot position and had to keep the arm at around 20deg of abduction to make the stretch tolerable.  She also had this tension on the left side.  Added this flossing as well as median nerve flossing which was also tight.    10/14/23 Eval performed Due to pt being familiar to started right into treatment Manual Therapy: Lt sidelying STM to the Rt latissimus from incision to insertion with gentle pressure, Then in supine to the latissimus, lateral chest/breast with arm around 40deg abducted and then with PROM into flexion and abduction.  Reviewed HEP to start including scapular retractions, wall walking into flexion and abduction  PATIENT EDUCATION:  Education details: per today's note Person educated: Patient Education method: Explanation, Demonstration, Tactile cues, Verbal cues, and Handouts Education comprehension: verbalized understanding  HOME EXERCISE PROGRAM: Wall walking flexion and abduction, scapular  retractions  Access Code: [email or web address] URL: [email or web address] Date: 10/19/2023 Prepared by: Saddie Raw  Exercises - Ulnar Nerve Butterfly  - 1 x daily - 7 x weekly - 1-3 sets - 10 reps - 5 seconds hold - Ulnar Nerve Flossing  - 1-2 x daily - 7 x weekly - 1 sets - 5 reps - 2-3 seconds hold - Standing Median Nerve Glide  - 1-2 x daily - 7 x weekly - 1 sets - 2-3 secon hold  ASSESSMENT:  CLINICAL IMPRESSION: Pt shows improvements in active shoulder flexion but similar pain with abduction.  Pain in the arm and tingling in the fingers was noted with PROM today so ULTT was performed with sig nn tension with ulnar and median nerve positions.  Added flossing for these and updated HEP. Pt will benefit from continued therapy to decrease sensitivity and improve reach.    OBJECTIVE IMPAIRMENTS: decreased activity tolerance, decreased mobility, decreased ROM, decreased strength, and increased fascial restrictions.   ACTIVITY LIMITATIONS: carrying and lifting  PARTICIPATION LIMITATIONS: community activity and yard work  PERSONAL FACTORS: surgical hx are also affecting patient's functional outcome.   REHAB POTENTIAL: Good  CLINICAL DECISION MAKING: Stable/uncomplicated  EVALUATION COMPLEXITY: Low  GOALS: Goals reviewed with patient? Yes   LONG TERM GOALS: Target date: 11/11/23  Pt will return to gardening and weight lifting activities without limitations  Baseline:  Goal status: INITIAL  2.  Pt will improve AROM to equal to the previous levels to improve mobility (flexion 150, abd 160) Baseline:  Goal status: INITIAL  3.  Pt will tolerate normal STM pressure to demonstrate less sensitivity.  Baseline:  Goal status: INITIAL   PLAN:  PT FREQUENCY: 2x/week  PT DURATION: 6 weeks  PLANNED INTERVENTIONS: [id number]- PT Re-evaluation, 97110-Therapeutic exercises, 97530- Therapeutic activity, 97535- Self Care, [id number]- Manual therapy, Patient/Family education, Balance  training, Taping, Dry Needling, Joint mobilization, Therapeutic exercises, Therapeutic activity, and Self Care  PLAN FOR NEXT SESSION: . Nerve flossing, Rt trunk STM, desensitization, cupping, taping, MARCILLE Raw Saddie JONELLE, PT 10/19/2023, 1:13 PM

## 2023-10-25 ENCOUNTER — Ambulatory Visit: Admitting: Rehabilitation

## 2023-10-25 DIAGNOSIS — Z483 Aftercare following surgery for neoplasm: Secondary | ICD-10-CM

## 2023-10-25 DIAGNOSIS — M25611 Stiffness of right shoulder, not elsewhere classified: Secondary | ICD-10-CM | POA: Diagnosis not present

## 2023-10-25 DIAGNOSIS — R293 Abnormal posture: Secondary | ICD-10-CM

## 2023-10-25 DIAGNOSIS — C50512 Malignant neoplasm of lower-outer quadrant of left female breast: Secondary | ICD-10-CM

## 2023-10-25 DIAGNOSIS — Z171 Estrogen receptor negative status [ER-]: Secondary | ICD-10-CM

## 2023-10-25 DIAGNOSIS — N644 Mastodynia: Secondary | ICD-10-CM

## 2023-10-25 NOTE — Therapy (Signed)
 OUTPATIENT PHYSICAL THERAPY  UPPER EXTREMITY ONCOLOGY EVALUATION  Patient Name: Julie Townsend MRN: [id number] DOB:February 09, 1965, 58 y.o., female Today's Date: 10/25/2023  END OF SESSION:  PT End of Session - 10/25/23 1001     Visit Number 3    Number of Visits 9    Date for Recertification  11/11/23    PT Start Time 1003    PT Stop Time 1047    PT Time Calculation (min) 44 min    Activity Tolerance Patient tolerated treatment well    Behavior During Therapy [facility] for tasks assessed/performed            Past Medical History:  Diagnosis Date   BRCA2 gene mutation positive 09/26/2021   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  Report date is September 26, 2021.    The BRCAplus panel offered by [employer] and includes sequencing and deletion/duplication analysis for the following 8 genes: ATM, BRCA1, BRCA2, CDH1, CHEK2, PALB2, PTEN, and TP53.  Results of pan-cancer panel are pending.     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    correct Hands   Port-A-Cath in place 03/10/2022   Past Surgical History:  Procedure Laterality Date   BONE BIOPSY  05/30/2023   Procedure: BIOPSY;  Surgeon: Wilhelmenia Aloha Raddle., MD;  Location: [facility];  Service: Gastroenterology;;   BREAST ENHANCEMENT SURGERY Bilateral 10/28/2022   BREAST IMPLANT REMOVAL Right 12/17/2021   Procedure: Right breast expander removal with washout;  Surgeon: Lowery Estefana RAMAN, DO;  Location: [facility];  Service: Plastics;  Laterality: Right;   BREAST RECONSTRUCTION WITH PLACEMENT OF TISSUE EXPANDER AND FLEX HD (ACELLULAR HYDRATED DERMIS) Bilateral 10/29/2021    Procedure: BREAST RECONSTRUCTION WITH PLACEMENT OF TISSUE EXPANDER AND FLEX HD (ACELLULAR HYDRATED DERMIS);  Surgeon: Lowery Estefana RAMAN, DO;  Location: [facility];  Service: Plastics;  Laterality: Bilateral;   BREAST RECONSTRUCTION WITH PLACEMENT OF TISSUE EXPANDER AND FLEX HD (ACELLULAR HYDRATED DERMIS) Right 07/26/2022   Procedure: PLACEMENT OF TISSUE EXPANDER;  Surgeon: Lowery Estefana RAMAN, DO;  Location: [facility];  Service: Plastics;  Laterality: Right;   CARPAL TUNNEL RELEASE Right 06/20/2020   Procedure: CARPAL TUNNEL RELEASE;  Surgeon: Murrell Drivers, MD;  Location: [facility];  Service: Orthopedics;  Laterality: Right;   DILATION AND CURETTAGE OF UTERUS     ESOPHAGOGASTRODUODENOSCOPY N/A 05/30/2023   Procedure: EGD (ESOPHAGOGASTRODUODENOSCOPY);  Surgeon: Wilhelmenia Aloha Raddle., MD;  Location: [facility];  Service: Gastroenterology;  Laterality: N/A;   EUS N/A 05/30/2023   Procedure: UPPER ENDOSCOPIC ULTRASOUND (EUS) RADIAL;  Surgeon: Wilhelmenia Aloha Raddle., MD;  Location: [facility];  Service: Gastroenterology;  Laterality: N/A;   IRRIGATION AND DEBRIDEMENT OF WOUND WITH SPLIT THICKNESS SKIN GRAFT Right 03/15/2022   Procedure: Debridement of right breast;  Surgeon: Lowery Estefana RAMAN, DO;  Location: [facility];  Service: Plastics;  Laterality: Right;   LATISSIMUS FLAP TO BREAST Right 07/26/2022   Procedure: Right breast reconstruction and latissimus muscle flap;  Surgeon: Lowery Estefana RAMAN, DO;  Location: [facility];  Service: Plastics;  Laterality: Right;   LIPOSUCTION WITH LIPOFILLING Bilateral 06/02/2023   Procedure: bilateral breast  fat grafting;  Surgeon: Lowery Estefana RAMAN, DO;  Location: [facility];  Service: Plastics;  Laterality: Bilateral;   MASTECTOMY     MASTECTOMY W/ SENTINEL NODE BIOPSY Left 10/29/2021   Procedure: LEFT MASTECTOMY WITH LEFT SENTINEL LYMPH NODE BIOPSY;  Surgeon: Vernetta Berg, MD;  Location: [facility];  Service: General;  Laterality: Left;   PORT-A-CATH REMOVAL N/A 07/26/2022   Procedure: PORT-A-CATH REMOVAL;  Surgeon: Vernetta Berg, MD;  Location: [facility];  Service: General;  Laterality: N/A;   PORTACATH PLACEMENT Right 10/29/2021   Procedure: INSERTION PORT-A-CATH;  Surgeon: Vernetta Berg, MD;  Location: [facility];  Service: General;  Laterality: Right;   REMOVAL OF TISSUE EXPANDER AND PLACEMENT OF IMPLANT Bilateral 10/28/2022   Procedure: REMOVAL OF TISSUE EXPANDER AND PLACEMENT OF IMPLANT,  repair of tissue defect to right lateral chest wall;  Surgeon: Lowery Estefana RAMAN, DO;  Location: [facility];  Service: Plastics;  Laterality: Bilateral;   REVISION, RECONSTRUCTION, BREAST Right 06/02/2023   Procedure: Liposuction right breast with excess tissue removal and bilateral breast fat grafting;  Surgeon: Lowery Estefana RAMAN, DO;  Location: [facility];  Service: Plastics;  Laterality: Right;   ROBOTIC ASSISTED SALPINGO OOPHERECTOMY Bilateral 09/15/2022   Procedure: XI ROBOTIC ASSISTED SALPINGO OOPHORECTOMY;  Surgeon: Viktoria Comer SAUNDERS, MD;  Location: [facility];  Service: Gynecology;  Laterality: Bilateral;   TOTAL MASTECTOMY Right 10/29/2021   Procedure: RIGHT TOTAL MASTECTOMY;  Surgeon: Vernetta Berg, MD;  Location: [facility];  Service: General;  Laterality: Right;   TRIGGER FINGER RELEASE Left 05/08/2020   Procedure: LEFT LONG FINGER TRIGGER RELEASE AND LEFT RING FINGER TRIGGER RELEASE;  Surgeon: Murrell Drivers, MD;  Location:  [facility];  Service: Orthopedics;  Laterality: Left;   trigger finger release rt hand     Patient Active Problem List   Diagnosis Date Noted   Excess skin 09/27/2023   Gastritis and gastroduodenitis 05/30/2023   Screening for cancer 05/30/2023   Acquired absence of right breast 07/26/2022   Cellulitis of right breast 03/10/2022   Medication management 03/10/2022   Breast  cancer (HCC) 10/29/2021   BRCA2 gene mutation positive 09/26/2021   Genetic testing 09/26/2021   Family history of breast cancer 09/22/2021   Family history of pancreatic cancer 09/22/2021   Family history of ovarian cancer 09/22/2021   Malignant neoplasm of lower-outer quadrant of left breast of female, estrogen receptor negative (HCC) 09/11/2021    PCP: None  REFERRING PROVIDER: Estefana Peck PA-C  REFERRING DIAG:  Diagnosis  Z98.890 (ICD-10-CM) - S/P breast reconstruction    THERAPY DIAG:  Stiffness of right shoulder, not elsewhere classified  Breast pain, right  Aftercare following surgery for neoplasm  Malignant neoplasm of lower-outer quadrant of left breast of female, estrogen receptor negative (HCC)  Malignant neoplasm of upper-outer quadrant of left breast in female, estrogen receptor negative (HCC)  Abnormal posture  ONSET DATE: 10/29/21  Rationale for Evaluation and Treatment: Rehabilitation  SUBJECTIVE:  SUBJECTIVE STATEMENT: I am feeling better .  None of my reaching is bothering me.  I haven't tried gardening yet.    PERTINENT HISTORY: She most recently underwent excision of dogear on the back 09/27/23. Hx of bilateral exchange of tissue expanders for implants and bilateral capsulotomies for implant repositioning with Dr. Lowery on 10/28/2022. Lt breast cancer 2023 with bilateral mastectomy 10/29/21 with 5 negative nodes. 12/17/21 - removal of Rt expander with washout. 07/26/22 - Rt lat flap and reconstruction with another expander. The Right side has a hx of 3 staph infections.   Completed chemo with ACT.  No radiation.    PAIN:  Are you having pain? Yes NPRS scale: 4/10 Pain location: the Rt inferior lateral breast , intermittently around the latissimus incision 9/10 and  that happens only intermittently and is a quick pain.   Pain orientation: Right  PAIN TYPE: dull and sharp Pain description: constant  Aggravating factors: driving, pulling the doors shut,   Relieving factors: nothing makes the breast feel better, they think it is maybe scar tissue.   PRECAUTIONS: Lt lymphedema risk    RED FLAGS: None   WEIGHT BEARING RESTRICTIONS: No  FALLS:  Has patient fallen in last 6 months? No  LIVING ENVIRONMENT: Lives with: lives with their family, lives with their spouse, and lives with their daughter  OCCUPATION: work at home.  No limitation   LEISURE: gardening, crocheting, - limited with gardening now   HAND DOMINANCE: right   PRIOR LEVEL OF FUNCTION: Independent  PATIENT GOALS: get back to normal.     OBJECTIVE: Note: Objective measures were completed at Evaluation unless otherwise noted.  COGNITION: Overall cognitive status: Within functional limits for tasks assessed   PALPATION: Very tender to light palpation Rt breast, trunk, chest wall and around lat incision.  Scar tissue thicker Rt lateral breast   OBSERVATIONS / OTHER ASSESSMENTS: well healed incisions, increased scar tissue Rt breast  POSTURE: WNL  UPPER EXTREMITY AROM/PROM:  A/PROM RIGHT   eval  10/19/23 10/25/23  Shoulder extension 60    Shoulder flexion 140 - pull 150 160  Shoulder abduction 120 - pn - having to sit down  110 - pn 125 pn  Shoulder internal rotation     Shoulder external rotation 90 - pn near lat      (Blank rows = not tested)  A/PROM LEFT   eval   Shoulder extension 60   Shoulder flexion 165   Shoulder abduction 165   Shoulder internal rotation    Shoulder external rotation 105     (Blank rows = not tested)  CERVICAL AROM: All within normal limits:   UPPER EXTREMITY STRENGTH:   L-DEX LYMPHEDEMA SCREENING: The patient was assessed using the L-Dex machine today to produce a lymphedema index baseline score. The patient will be reassessed on a  regular basis (typically every 3 months) to obtain new L-Dex scores. If the score is > 6.5 points away from his/her baseline score indicating onset of subclinical lymphedema, it will be recommended to wear a compression garment for 4 weeks, 12 hours per day and then be reassessed. If the score continues to be > 6.5 points from baseline at reassessment, we will initiate lymphedema treatment. Assessing in this manner has a 95% rate of preventing clinically significant lymphedema.  QUICK DASH SURVEY: 54%    TODAY'S TREATMENT:  DATE:  10/25/23 Therapeutic Activity: Pulleys into flexion and abduction for stretch at end range Standing nerve flossing for median and ulnar nerves bilateral Manual Therapy: Supine STM to Rt pectoralis and in Lt sidelying STM to the Rt latissimus from incision to insertion with gentle pressure, Then scar tissue mobilization using stretch in all directions following along the borders and then directly on the incision.  PROM into flexion and abduction which was limited due to tingling starting in fingers 3-5.   10/19/23 Manual Therapy: Supine STM to Rt pectoralis and in Lt sidelying STM to the Rt latissimus from incision to insertion with gentle pressure, Then scar tissue mobilization using stretch in all directions following along the borders and then directly on the incision.  PROM into flexion and abduction which was limited due to tingling starting in fingers 3-5.  Had pt perform seated ulnar nerve flossing in tea pot position and had to keep the arm at around 20deg of abduction to make the stretch tolerable.  She also had this tension on the left side.  Added this flossing as well as median nerve flossing which was also tight.    10/14/23 Eval performed Due to pt being familiar to started right into treatment Manual Therapy: Lt sidelying  STM to the Rt latissimus from incision to insertion with gentle pressure, Then in supine to the latissimus, lateral chest/breast with arm around 40deg abducted and then with PROM into flexion and abduction.  Reviewed HEP to start including scapular retractions, wall walking into flexion and abduction  PATIENT EDUCATION:  Education details: per today's note Person educated: Patient Education method: Explanation, Demonstration, Tactile cues, Verbal cues, and Handouts Education comprehension: verbalized understanding  HOME EXERCISE PROGRAM: Wall walking flexion and abduction, scapular retractions  Access Code: [email or web address] URL: [email or web address] Date: 10/19/2023 Prepared by: Saddie Raw  Exercises - Ulnar Nerve Butterfly  - 1 x daily - 7 x weekly - 1-3 sets - 10 reps - 5 seconds hold - Ulnar Nerve Flossing  - 1-2 x daily - 7 x weekly - 1 sets - 5 reps - 2-3 seconds hold - Standing Median Nerve Glide  - 1-2 x daily - 7 x weekly - 1 sets - 2-3 secon hold  ASSESSMENT:  CLINICAL IMPRESSION: Pt shows improvements in her nerve tension and is able to get her arm up to 85deg abduction compared with around 40deg last time.  She just needs to try gardening this weekend to see if she has returned to prior status.   OBJECTIVE IMPAIRMENTS: decreased activity tolerance, decreased mobility, decreased ROM, decreased strength, and increased fascial restrictions.   ACTIVITY LIMITATIONS: carrying and lifting  PARTICIPATION LIMITATIONS: community activity and yard work  PERSONAL FACTORS: surgical hx are also affecting patient's functional outcome.   REHAB POTENTIAL: Good  CLINICAL DECISION MAKING: Stable/uncomplicated  EVALUATION COMPLEXITY: Low  GOALS: Goals reviewed with patient? Yes   LONG TERM GOALS: Target date: 11/11/23  Pt will return to gardening and weight lifting activities without limitations  Baseline:  Goal status: INITIAL  2.  Pt will improve AROM to equal to the  previous levels to improve mobility (flexion 150, abd 160) Baseline:  Goal status: INITIAL  3.  Pt will tolerate normal STM pressure to demonstrate less sensitivity.  Baseline:  Goal status: INITIAL   PLAN:  PT FREQUENCY: 2x/week  PT DURATION: 6 weeks  PLANNED INTERVENTIONS: [id number]- PT Re-evaluation, 97110-Therapeutic exercises, 97530- Therapeutic activity, 97535- Self Care, [id number]- Manual therapy, Patient/Family education, Balance training, Taping, Dry Needling, Joint  mobilization, Therapeutic exercises, Therapeutic activity, and Self Care  PLAN FOR NEXT SESSION: . Nerve flossing, Rt trunk STM, desensitization, cupping, taping, MARCILLE Larue Saddie JONELLE, PT 10/25/2023, 10:56 AM

## 2023-10-27 ENCOUNTER — Encounter: Payer: Self-pay | Admitting: Rehabilitation

## 2023-10-27 ENCOUNTER — Ambulatory Visit: Admitting: Rehabilitation

## 2023-10-27 DIAGNOSIS — M25611 Stiffness of right shoulder, not elsewhere classified: Secondary | ICD-10-CM

## 2023-10-27 DIAGNOSIS — N644 Mastodynia: Secondary | ICD-10-CM

## 2023-10-27 DIAGNOSIS — Z483 Aftercare following surgery for neoplasm: Secondary | ICD-10-CM

## 2023-10-27 DIAGNOSIS — C50512 Malignant neoplasm of lower-outer quadrant of left female breast: Secondary | ICD-10-CM

## 2023-10-27 NOTE — Therapy (Signed)
 OUTPATIENT PHYSICAL THERAPY  UPPER EXTREMITY ONCOLOGY EVALUATION  Patient Name: Julie Townsend MRN: [id number] DOB:Jul 03, 1965, 58 y.o., female Today's Date: 10/27/2023  END OF SESSION:  PT End of Session - 10/27/23 1148     Visit Number 4    Number of Visits 9    Date for Recertification  11/11/23    PT Start Time 1103    PT Stop Time 1147    PT Time Calculation (min) 44 min    Activity Tolerance Patient tolerated treatment well    Behavior During Therapy [facility] for tasks assessed/performed             Past Medical History:  Diagnosis Date   BRCA2 gene mutation positive 09/26/2021   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  Report date is September 26, 2021.    The BRCAplus panel offered by [employer] and includes sequencing and deletion/duplication analysis for the following 8 genes: ATM, BRCA1, BRCA2, CDH1, CHEK2, PALB2, PTEN, and TP53.  Results of pan-cancer panel are pending.     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    correct Hands   Port-A-Cath in place 03/10/2022   Past Surgical History:  Procedure Laterality Date   BONE BIOPSY  05/30/2023   Procedure: BIOPSY;  Surgeon: Wilhelmenia Aloha Raddle., MD;  Location: [facility];  Service: Gastroenterology;;   BREAST ENHANCEMENT SURGERY Bilateral 10/28/2022   BREAST IMPLANT REMOVAL Right 12/17/2021   Procedure: Right breast expander removal with washout;  Surgeon: Lowery Estefana RAMAN, DO;  Location: [facility];  Service: Plastics;  Laterality: Right;   BREAST RECONSTRUCTION WITH PLACEMENT OF TISSUE EXPANDER AND FLEX HD (ACELLULAR HYDRATED DERMIS) Bilateral 10/29/2021    Procedure: BREAST RECONSTRUCTION WITH PLACEMENT OF TISSUE EXPANDER AND FLEX HD (ACELLULAR HYDRATED DERMIS);  Surgeon: Lowery Estefana RAMAN, DO;  Location: [facility];  Service: Plastics;  Laterality: Bilateral;   BREAST RECONSTRUCTION WITH PLACEMENT OF TISSUE EXPANDER AND FLEX HD (ACELLULAR HYDRATED DERMIS) Right 07/26/2022   Procedure: PLACEMENT OF TISSUE EXPANDER;  Surgeon: Lowery Estefana RAMAN, DO;  Location: [facility];  Service: Plastics;  Laterality: Right;   CARPAL TUNNEL RELEASE Right 06/20/2020   Procedure: CARPAL TUNNEL RELEASE;  Surgeon: Murrell Drivers, MD;  Location: [facility];  Service: Orthopedics;  Laterality: Right;   DILATION AND CURETTAGE OF UTERUS     ESOPHAGOGASTRODUODENOSCOPY N/A 05/30/2023   Procedure: EGD (ESOPHAGOGASTRODUODENOSCOPY);  Surgeon: Wilhelmenia Aloha Raddle., MD;  Location: [facility];  Service: Gastroenterology;  Laterality: N/A;   EUS N/A 05/30/2023   Procedure: UPPER ENDOSCOPIC ULTRASOUND (EUS) RADIAL;  Surgeon: Wilhelmenia Aloha Raddle., MD;  Location: [facility];  Service: Gastroenterology;  Laterality: N/A;   IRRIGATION AND DEBRIDEMENT OF WOUND WITH SPLIT THICKNESS SKIN GRAFT Right 03/15/2022   Procedure: Debridement of right breast;  Surgeon: Lowery Estefana RAMAN, DO;  Location: [facility];  Service: Plastics;  Laterality: Right;   LATISSIMUS FLAP TO BREAST Right 07/26/2022   Procedure: Right breast reconstruction and latissimus muscle flap;  Surgeon: Lowery Estefana RAMAN, DO;  Location: [facility];  Service: Plastics;  Laterality: Right;   LIPOSUCTION WITH LIPOFILLING Bilateral 06/02/2023   Procedure: bilateral  breast fat grafting;  Surgeon: Lowery Estefana RAMAN, DO;  Location: [facility];  Service: Plastics;  Laterality: Bilateral;   MASTECTOMY     MASTECTOMY W/ SENTINEL NODE BIOPSY Left 10/29/2021   Procedure: LEFT MASTECTOMY WITH LEFT SENTINEL LYMPH NODE BIOPSY;  Surgeon: Vernetta Berg, MD;  Location: [facility];  Service: General;  Laterality: Left;   PORT-A-CATH REMOVAL N/A 07/26/2022   Procedure: PORT-A-CATH REMOVAL;  Surgeon: Vernetta Berg, MD;  Location: [facility];  Service: General;  Laterality: N/A;   PORTACATH PLACEMENT Right 10/29/2021   Procedure: INSERTION PORT-A-CATH;  Surgeon: Vernetta Berg, MD;  Location: [facility];  Service: General;  Laterality: Right;   REMOVAL OF TISSUE EXPANDER AND PLACEMENT OF IMPLANT Bilateral 10/28/2022   Procedure: REMOVAL OF TISSUE EXPANDER AND PLACEMENT OF IMPLANT,  repair of tissue defect to right lateral chest wall;  Surgeon: Lowery Estefana RAMAN, DO;  Location: [facility];  Service: Plastics;  Laterality: Bilateral;   REVISION, RECONSTRUCTION, BREAST Right 06/02/2023   Procedure: Liposuction right breast with excess tissue removal and bilateral breast fat grafting;  Surgeon: Lowery Estefana RAMAN, DO;  Location: [facility];  Service: Plastics;  Laterality: Right;   ROBOTIC ASSISTED SALPINGO OOPHERECTOMY Bilateral 09/15/2022   Procedure: XI ROBOTIC ASSISTED SALPINGO OOPHORECTOMY;  Surgeon: Viktoria Comer SAUNDERS, MD;  Location: [facility];  Service: Gynecology;  Laterality: Bilateral;   TOTAL MASTECTOMY Right 10/29/2021   Procedure: RIGHT TOTAL MASTECTOMY;  Surgeon: Vernetta Berg, MD;  Location: [facility];  Service: General;  Laterality: Right;   TRIGGER FINGER RELEASE Left 05/08/2020   Procedure: LEFT LONG FINGER TRIGGER RELEASE AND LEFT RING FINGER TRIGGER RELEASE;  Surgeon: Murrell Drivers, MD;  Location: [facility];  Service: Orthopedics;  Laterality: Left;   trigger finger release rt hand     Patient Active Problem List   Diagnosis Date Noted   Excess skin 09/27/2023   Gastritis and gastroduodenitis 05/30/2023   Screening for cancer 05/30/2023   Acquired absence of right breast 07/26/2022   Cellulitis of right breast 03/10/2022   Medication management 03/10/2022   Breast  cancer (HCC) 10/29/2021   BRCA2 gene mutation positive 09/26/2021   Genetic testing 09/26/2021   Family history of breast cancer 09/22/2021   Family history of pancreatic cancer 09/22/2021   Family history of ovarian cancer 09/22/2021   Malignant neoplasm of lower-outer quadrant of left breast of female, estrogen receptor negative (HCC) 09/11/2021    PCP: None  REFERRING PROVIDER: Estefana Peck PA-C  REFERRING DIAG:  Diagnosis  Z98.890 (ICD-10-CM) - S/P breast reconstruction    THERAPY DIAG:  Stiffness of right shoulder, not elsewhere classified  Breast pain, right  Aftercare following surgery for neoplasm  Malignant neoplasm of lower-outer quadrant of left breast of female, estrogen receptor negative (HCC)  ONSET DATE: 10/29/21  Rationale for Evaluation and Treatment: Rehabilitation  SUBJECTIVE:  SUBJECTIVE STATEMENT: I am not feeling great but the arm is better  PERTINENT HISTORY: She most recently underwent excision of dogear on the back 09/27/23. Hx of bilateral exchange of tissue expanders for implants and bilateral capsulotomies for implant repositioning with Dr. Lowery on 10/28/2022. Lt breast cancer 2023 with bilateral mastectomy 10/29/21 with 5 negative nodes. 12/17/21 - removal of Rt expander with washout. 07/26/22 - Rt lat flap and reconstruction with another expander. The Right side has a hx of 3 staph infections.   Completed chemo with ACT.  No radiation.    PAIN:  Are you having pain? Yes NPRS scale: 4/10 Pain location: the Rt inferior lateral breast , intermittently around the latissimus incision 9/10 and that happens only intermittently and is a quick pain.   Pain orientation: Right  PAIN TYPE: dull and sharp Pain description: constant  Aggravating factors: driving, pulling  the doors shut,   Relieving factors: nothing makes the breast feel better, they think it is maybe scar tissue.   PRECAUTIONS: Lt lymphedema risk    RED FLAGS: None   WEIGHT BEARING RESTRICTIONS: No  FALLS:  Has patient fallen in last 6 months? No  LIVING ENVIRONMENT: Lives with: lives with their family, lives with their spouse, and lives with their daughter  OCCUPATION: work at home.  No limitation   LEISURE: gardening, crocheting, - limited with gardening now   HAND DOMINANCE: right   PRIOR LEVEL OF FUNCTION: Independent  PATIENT GOALS: get back to normal.     OBJECTIVE: Note: Objective measures were completed at Evaluation unless otherwise noted.  COGNITION: Overall cognitive status: Within functional limits for tasks assessed   PALPATION: Very tender to light palpation Rt breast, trunk, chest wall and around lat incision.  Scar tissue thicker Rt lateral breast   OBSERVATIONS / OTHER ASSESSMENTS: well healed incisions, increased scar tissue Rt breast  POSTURE: WNL  UPPER EXTREMITY AROM/PROM:  A/PROM RIGHT   eval  10/19/23 10/25/23  Shoulder extension 60    Shoulder flexion 140 - pull 150 160  Shoulder abduction 120 - pn - having to sit down  110 - pn 125 pn  Shoulder internal rotation     Shoulder external rotation 90 - pn near lat      (Blank rows = not tested)  A/PROM LEFT   eval   Shoulder extension 60   Shoulder flexion 165   Shoulder abduction 165   Shoulder internal rotation    Shoulder external rotation 105     (Blank rows = not tested)  CERVICAL AROM: All within normal limits:   UPPER EXTREMITY STRENGTH:   L-DEX LYMPHEDEMA SCREENING: The patient was assessed using the L-Dex machine today to produce a lymphedema index baseline score. The patient will be reassessed on a regular basis (typically every 3 months) to obtain new L-Dex scores. If the score is > 6.5 points away from his/her baseline score indicating onset of subclinical lymphedema, it  will be recommended to wear a compression garment for 4 weeks, 12 hours per day and then be reassessed. If the score continues to be > 6.5 points from baseline at reassessment, we will initiate lymphedema treatment. Assessing in this manner has a 95% rate of preventing clinically significant lymphedema.  QUICK DASH SURVEY: 54%    TODAY'S TREATMENT:  DATE:  10/27/23 Therapeutic Activity: Pulleys into flexion and abduction for stretch at end range Alternating flexion x 10 Pro/ret x 10 bil  Supine chest stretch 20 x 3 Manual Therapy: Supine STM to Rt pectoralis and in Lt sidelying STM to the Rt latissimus from incision to insertion with gentle pressure, Then scar tissue mobilization using stretch in all directions following along the borders and then directly on the incision.  PROM into flexion and abduction which was limited due to tingling starting in fingers 3-5.  10/25/23 Therapeutic Activity: Pulleys into flexion and abduction for stretch at end range Standing nerve flossing for median and ulnar nerves bilateral Manual Therapy: Supine STM to Rt pectoralis and in Lt sidelying STM to the Rt latissimus from incision to insertion with gentle pressure, Then scar tissue mobilization using stretch in all directions following along the borders and then directly on the incision.  PROM into flexion and abduction which was limited due to tingling starting in fingers 3-5.   10/19/23 Manual Therapy: Supine STM to Rt pectoralis and in Lt sidelying STM to the Rt latissimus from incision to insertion with gentle pressure, Then scar tissue mobilization using stretch in all directions following along the borders and then directly on the incision.  PROM into flexion and abduction which was limited due to tingling starting in fingers 3-5.  Had pt perform seated ulnar nerve  flossing in tea pot position and had to keep the arm at around 20deg of abduction to make the stretch tolerable.  She also had this tension on the left side.  Added this flossing as well as median nerve flossing which was also tight.     PATIENT EDUCATION:  Education details: per today's note Person educated: Patient Education method: Explanation, Demonstration, Tactile cues, Verbal cues, and Handouts Education comprehension: verbalized understanding  HOME EXERCISE PROGRAM: Wall walking flexion and abduction, scapular retractions  Access Code: [email or web address] URL: [email or web address] Date: 10/19/2023 Prepared by: Saddie Raw  Exercises - Ulnar Nerve Butterfly  - 1 x daily - 7 x weekly - 1-3 sets - 10 reps - 5 seconds hold - Ulnar Nerve Flossing  - 1-2 x daily - 7 x weekly - 1 sets - 5 reps - 2-3 seconds hold - Standing Median Nerve Glide  - 1-2 x daily - 7 x weekly - 1 sets - 2-3 secon hold  ASSESSMENT:  CLINICAL IMPRESSION: Overall pt is feeling better.  She is tolerating normal full pressure with STM and has +2 ttp intermittently to pressure at the mid incision. Pt will try yard work over the weekend to see how she feels   OBJECTIVE IMPAIRMENTS: decreased activity tolerance, decreased mobility, decreased ROM, decreased strength, and increased fascial restrictions.   ACTIVITY LIMITATIONS: carrying and lifting  PARTICIPATION LIMITATIONS: community activity and yard work  PERSONAL FACTORS: surgical hx are also affecting patient's functional outcome.   REHAB POTENTIAL: Good  CLINICAL DECISION MAKING: Stable/uncomplicated  EVALUATION COMPLEXITY: Low  GOALS: Goals reviewed with patient? Yes   LONG TERM GOALS: Target date: 11/11/23  Pt will return to gardening and weight lifting activities without limitations  Baseline:  Goal status: INITIAL  2.  Pt will improve AROM to equal to the previous levels to improve mobility (flexion 150, abd 160) Baseline:  Goal  status: INITIAL  3.  Pt will tolerate normal STM pressure to demonstrate less sensitivity.  Baseline:  Goal status: INITIAL   PLAN:  PT FREQUENCY: 2x/week  PT DURATION: 6 weeks  PLANNED INTERVENTIONS: 02835- PT Re-evaluation,  97110-Therapeutic exercises, 97530- Therapeutic activity, 97535- Self Care, 02859- Manual therapy, Patient/Family education, Balance training, Taping, Dry Needling, Joint mobilization, Therapeutic exercises, Therapeutic activity, and Self Care  PLAN FOR NEXT SESSION: . Nerve flossing, Rt trunk STM, desensitization, cupping, taping, MARCILLE Larue Saddie JONELLE, PT 10/27/2023, 11:49 AM

## 2023-10-31 ENCOUNTER — Other Ambulatory Visit: Payer: Self-pay | Admitting: Neurology

## 2023-11-01 ENCOUNTER — Encounter: Admitting: Rehabilitation

## 2023-11-03 ENCOUNTER — Encounter: Admitting: Rehabilitation

## 2023-11-03 ENCOUNTER — Other Ambulatory Visit: Payer: Self-pay | Admitting: Student

## 2023-11-03 DIAGNOSIS — Z9889 Other specified postprocedural states: Secondary | ICD-10-CM

## 2023-11-03 DIAGNOSIS — L989 Disorder of the skin and subcutaneous tissue, unspecified: Secondary | ICD-10-CM

## 2023-11-03 NOTE — Progress Notes (Signed)
 Spoke with Dr. Lowery about papule to patient's left breast, Dr. Lowery offered to do biopsy of the area.  I called the patient and discussed this with her, she states that she would like to move forward with a biopsy of it.  She does state that that she has an appointment with dermatology in December which she is going to keep because she states that she has other skin issues she would like to discuss with them as well.

## 2023-11-08 ENCOUNTER — Ambulatory Visit: Admitting: Rehabilitation

## 2023-11-10 ENCOUNTER — Encounter: Admitting: Rehabilitation

## 2023-12-05 ENCOUNTER — Other Ambulatory Visit: Payer: Self-pay | Admitting: Neurology

## 2024-01-03 ENCOUNTER — Encounter (HOSPITAL_BASED_OUTPATIENT_CLINIC_OR_DEPARTMENT_OTHER): Payer: Self-pay | Admitting: Internal Medicine

## 2024-01-06 ENCOUNTER — Other Ambulatory Visit (HOSPITAL_COMMUNITY)
Admission: RE | Admit: 2024-01-06 | Discharge: 2024-01-06 | Disposition: A | Source: Ambulatory Visit | Attending: Plastic Surgery | Admitting: Plastic Surgery

## 2024-01-06 ENCOUNTER — Ambulatory Visit: Admitting: Plastic Surgery

## 2024-01-06 DIAGNOSIS — L989 Disorder of the skin and subcutaneous tissue, unspecified: Secondary | ICD-10-CM | POA: Insufficient documentation

## 2024-01-06 NOTE — Progress Notes (Signed)
 Procedure Note  Preoperative Dx: changing skin lesion left breast  Postoperative Dx: Same  Procedure: Excision of changing skin lesion left breast 3 mm  Anesthesia: Lidocaine  1% with 1:100,000 epinephrine   Indication for Procedure: skin lesion  Description of Procedure: Risks and complications were explained to the patient.  Consent was confirmed and the patient understands the risks and benefits.  The potential complications and alternatives were explained and the patient consents.  The patient expressed understanding the option of not having the procedure and the risks of a scar.  Time out was called and all information was confirmed to be correct.    The area was prepped and drapped.  Lidocaine  1% with epinephrine  was injected in the subcutaneous area.  After waiting several minutes for the local to take affect a 3 mm knife was used to excise the area. The skin edges were reapproximated with 6-0 Monocryl.  A dressing was applied.  The patient was given instructions on how to care for the area and a follow up appointment.  Julie Townsend tolerated the procedure well and there were no complications. The specimen was sent to pathology.

## 2024-01-09 LAB — SURGICAL PATHOLOGY

## 2024-01-10 ENCOUNTER — Encounter: Payer: Self-pay | Admitting: Dermatology

## 2024-01-10 ENCOUNTER — Ambulatory Visit: Admitting: Physician Assistant

## 2024-01-10 VITALS — BP 110/78 | HR 86

## 2024-01-10 DIAGNOSIS — Q825 Congenital non-neoplastic nevus: Secondary | ICD-10-CM

## 2024-01-10 DIAGNOSIS — L219 Seborrheic dermatitis, unspecified: Secondary | ICD-10-CM

## 2024-01-10 MED ORDER — TRIAMCINOLONE ACETONIDE 0.1 % EX OINT: 1.0000 | TOPICAL_OINTMENT | Freq: Every day | CUTANEOUS | 1 refills | Status: AC | PRN

## 2024-01-10 NOTE — Patient Instructions (Signed)

## 2024-01-10 NOTE — Progress Notes (Signed)
   New Patient Visit   Subjective  Julie Townsend is a 58 y.o. female NEW PATIENT who presents for the following: Patient states she has a dry patch on her upper lip and an itchy spot on the left posterior scalp. Ongoing now since August of 2024.   Patient has tried hydrocortisone 1%, Medline remedy ointment, Equate 2% colloidal oatmeal moisturizer, and Medline Remedy Hydragaurd. Patient finished chemotherapy for breast cancer in May 2024.   Patient was seen and evaluated by Dr. Lowery for a lesion on the left breast with the result of folliculitis.      The following portions of the chart were reviewed this encounter and updated as appropriate: medications, allergies, medical history  Review of Systems:  No other skin or systemic complaints except as noted in HPI or Assessment and Plan.  Objective  Well appearing patient in no apparent distress; mood and affect are within normal limits.   A focused examination was performed of the following areas: Scalp  Relevant exam findings are noted in the Assessment and Plan.       Assessment & Plan   Congenital Nevus Exam: Tan-brown and/or pink-flesh-colored symmetric macules and papules on the left parietal scalp.   Treatment Plan: Benign appearing on exam today. Recommend observation.  --  patient can apply hydrocortisone or triamcinolone  for itching.    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  It tends to be exacerbated by stress and cooler weather.  People who have neurologic disease may experience new onset or exacerbation of existing seborrheic dermatitis.  The condition is not curable but treatable and can be controlled.  Treatment  Plan: Triamcinolone  0.1% ointment BID PRN - Do not use for more than 2 weeks.  - After treating with Triamcinolone , use Aquaphor.    CONGENITAL NON-NEOPLASTIC NEVUS   SEBORRHEIC DERMATITIS    Return in about 4 weeks (around 02/07/2024) for [facility] follow up.  FERNS Rollene Gobble, RN, am acting as scribe for [employer], PA-C .   Documentation: I have reviewed the above documentation for accuracy and completeness, and I agree with the above.  Khamora Karan K, PA-C

## 2024-01-11 ENCOUNTER — Telehealth: Admitting: Physician Assistant

## 2024-01-13 LAB — NMR, LIPOPROFILE
Cholesterol, Total: 185 mg/dL (ref 100–199)
HDL Particle Number: 51.9 umol/L (ref >=30.5)
HDL-C: 81 mg/dL (ref >39)
LDL Particle Number: 895 nmol/L (ref <1000)
LDL Size: 21.1 nm (ref >20.5)
LDL-C (NIH Calc): 89 mg/dL (ref 0–99)
LP-IR Score: 39 (ref <=45)
Small LDL Particle Number: 215 nmol/L (ref <=527)
Triglycerides: 80 mg/dL (ref 0–149)

## 2024-01-19 ENCOUNTER — Ambulatory Visit: Admitting: Physician Assistant

## 2024-01-19 VITALS — BP 113/72 | HR 76

## 2024-01-19 DIAGNOSIS — L739 Follicular disorder, unspecified: Secondary | ICD-10-CM

## 2024-01-19 NOTE — Progress Notes (Signed)
 Patient is a pleasant 58 year old female s/p excision of left breast skin lesion performed 01/06/2024 in office by Dr. Lowery who returns to clinic for postprocedural follow-up.  Reviewed procedural report and the skin edges were approximated with 6-0 Monocryl.  Specimen was sent to pathology and consistent with chronic folliculitis, negative for malignancy.  Today, patient is doing well.  No specific concerns or complaints.  Discussed pathology.  On exam, skin edges well-approximated.  The Steri-Strip and then a single Monocryl interrupted suture is removed without complication.  Follow-up only as needed.

## 2024-02-07 ENCOUNTER — Ambulatory Visit: Admitting: Physician Assistant

## 2024-02-22 ENCOUNTER — Other Ambulatory Visit: Payer: Self-pay | Admitting: Hematology and Oncology

## 2024-03-19 ENCOUNTER — Ambulatory Visit: Payer: Commercial Managed Care - HMO | Admitting: Neurology

## 2024-08-16 ENCOUNTER — Telehealth: Admitting: Hematology and Oncology

## 2024-10-12 ENCOUNTER — Ambulatory Visit: Admitting: Plastic Surgery
